# Patient Record
Sex: Female | Born: 1950 | Race: White | Hispanic: No | Marital: Married | Smoking: Never smoker
Health system: Southern US, Community
[De-identification: ages and names within clinical notes are randomized; demographics above are authoritative.]

## PROBLEM LIST (undated history)

## (undated) DIAGNOSIS — R110 Nausea: Secondary | ICD-10-CM

## (undated) DIAGNOSIS — E785 Hyperlipidemia, unspecified: Secondary | ICD-10-CM

## (undated) DIAGNOSIS — F410 Panic disorder [episodic paroxysmal anxiety] without agoraphobia: Secondary | ICD-10-CM

## (undated) DIAGNOSIS — M779 Enthesopathy, unspecified: Secondary | ICD-10-CM

## (undated) DIAGNOSIS — F419 Anxiety disorder, unspecified: Secondary | ICD-10-CM

## (undated) DIAGNOSIS — G43909 Migraine, unspecified, not intractable, without status migrainosus: Secondary | ICD-10-CM

## (undated) DIAGNOSIS — N390 Urinary tract infection, site not specified: Secondary | ICD-10-CM

## (undated) DIAGNOSIS — D649 Anemia, unspecified: Secondary | ICD-10-CM

## (undated) DIAGNOSIS — M2540 Effusion, unspecified joint: Secondary | ICD-10-CM

## (undated) DIAGNOSIS — I10 Essential (primary) hypertension: Secondary | ICD-10-CM

## (undated) DIAGNOSIS — M1990 Unspecified osteoarthritis, unspecified site: Secondary | ICD-10-CM

## (undated) DIAGNOSIS — Z8719 Personal history of other diseases of the digestive system: Secondary | ICD-10-CM

## (undated) DIAGNOSIS — K219 Gastro-esophageal reflux disease without esophagitis: Secondary | ICD-10-CM

## (undated) DIAGNOSIS — Z7901 Long term (current) use of anticoagulants: Secondary | ICD-10-CM

## (undated) DIAGNOSIS — I480 Paroxysmal atrial fibrillation: Secondary | ICD-10-CM

## (undated) DIAGNOSIS — J45909 Unspecified asthma, uncomplicated: Secondary | ICD-10-CM

## (undated) DIAGNOSIS — M79609 Pain in unspecified limb: Secondary | ICD-10-CM

## (undated) HISTORY — PX: ROTATOR CUFF REPAIR: SHX139

## (undated) HISTORY — DX: Pain in unspecified limb: M79.609

## (undated) HISTORY — DX: Personal history of other diseases of the digestive system: Z87.19

## (undated) HISTORY — DX: Anemia, unspecified: D64.9

## (undated) HISTORY — PX: CARDIOVERSION: SHX1299

## (undated) HISTORY — DX: Urinary tract infection, site not specified: N39.0

## (undated) HISTORY — DX: Hyperlipidemia, unspecified: E78.5

## (undated) HISTORY — DX: Nausea: R11.0

## (undated) HISTORY — PX: ABDOMINAL HYSTERECTOMY: SHX81

## (undated) HISTORY — DX: Unspecified osteoarthritis, unspecified site: M19.90

## (undated) HISTORY — DX: Enthesopathy, unspecified: M77.9

## (undated) HISTORY — PX: COLONOSCOPY: SHX174

## (undated) HISTORY — DX: Panic disorder (episodic paroxysmal anxiety): F41.0

## (undated) HISTORY — DX: Gastro-esophageal reflux disease without esophagitis: K21.9

## (undated) HISTORY — DX: Anxiety disorder, unspecified: F41.9

## (undated) HISTORY — DX: Migraine, unspecified, not intractable, without status migrainosus: G43.909

## (undated) HISTORY — DX: Essential (primary) hypertension: I10

## (undated) HISTORY — DX: Unspecified asthma, uncomplicated: J45.909

## (undated) HISTORY — DX: Paroxysmal atrial fibrillation: I48.0

## (undated) HISTORY — DX: Long term (current) use of anticoagulants: Z79.01

## (undated) HISTORY — DX: Effusion, unspecified joint: M25.40

---

## 2014-11-16 DIAGNOSIS — K219 Gastro-esophageal reflux disease without esophagitis: Secondary | ICD-10-CM | POA: Insufficient documentation

## 2014-11-16 DIAGNOSIS — M79609 Pain in unspecified limb: Secondary | ICD-10-CM

## 2014-11-16 DIAGNOSIS — M779 Enthesopathy, unspecified: Secondary | ICD-10-CM

## 2014-11-16 DIAGNOSIS — J45909 Unspecified asthma, uncomplicated: Secondary | ICD-10-CM

## 2014-11-16 DIAGNOSIS — R110 Nausea: Secondary | ICD-10-CM | POA: Insufficient documentation

## 2014-11-16 DIAGNOSIS — M2540 Effusion, unspecified joint: Secondary | ICD-10-CM

## 2014-11-16 DIAGNOSIS — Z7901 Long term (current) use of anticoagulants: Secondary | ICD-10-CM

## 2014-11-16 DIAGNOSIS — F410 Panic disorder [episodic paroxysmal anxiety] without agoraphobia: Secondary | ICD-10-CM

## 2014-11-16 DIAGNOSIS — G43909 Migraine, unspecified, not intractable, without status migrainosus: Secondary | ICD-10-CM

## 2014-11-16 HISTORY — DX: Effusion, unspecified joint: M25.40

## 2014-11-16 HISTORY — DX: Migraine, unspecified, not intractable, without status migrainosus: G43.909

## 2014-11-16 HISTORY — DX: Gastro-esophageal reflux disease without esophagitis: K21.9

## 2014-11-16 HISTORY — DX: Long term (current) use of anticoagulants: Z79.01

## 2014-11-16 HISTORY — DX: Pain in unspecified limb: M79.609

## 2014-11-16 HISTORY — DX: Nausea: R11.0

## 2014-11-16 HISTORY — DX: Unspecified asthma, uncomplicated: J45.909

## 2014-11-16 HISTORY — DX: Panic disorder (episodic paroxysmal anxiety): F41.0

## 2014-11-16 HISTORY — DX: Enthesopathy, unspecified: M77.9

## 2014-11-17 DIAGNOSIS — I480 Paroxysmal atrial fibrillation: Secondary | ICD-10-CM

## 2014-11-17 DIAGNOSIS — E785 Hyperlipidemia, unspecified: Secondary | ICD-10-CM

## 2014-11-17 HISTORY — DX: Paroxysmal atrial fibrillation: I48.0

## 2014-11-17 HISTORY — DX: Hyperlipidemia, unspecified: E78.5

## 2015-08-15 DIAGNOSIS — I10 Essential (primary) hypertension: Secondary | ICD-10-CM

## 2015-08-15 HISTORY — DX: Essential (primary) hypertension: I10

## 2015-12-19 DIAGNOSIS — R0789 Other chest pain: Secondary | ICD-10-CM | POA: Insufficient documentation

## 2015-12-19 DIAGNOSIS — N390 Urinary tract infection, site not specified: Secondary | ICD-10-CM

## 2015-12-19 HISTORY — DX: Urinary tract infection, site not specified: N39.0

## 2016-04-29 DIAGNOSIS — R5383 Other fatigue: Secondary | ICD-10-CM | POA: Diagnosis not present

## 2016-04-29 DIAGNOSIS — I4891 Unspecified atrial fibrillation: Secondary | ICD-10-CM | POA: Diagnosis not present

## 2016-04-29 DIAGNOSIS — J209 Acute bronchitis, unspecified: Secondary | ICD-10-CM | POA: Diagnosis not present

## 2016-04-29 DIAGNOSIS — I10 Essential (primary) hypertension: Secondary | ICD-10-CM | POA: Diagnosis not present

## 2016-05-07 DIAGNOSIS — Z7901 Long term (current) use of anticoagulants: Secondary | ICD-10-CM | POA: Diagnosis not present

## 2016-05-07 DIAGNOSIS — I10 Essential (primary) hypertension: Secondary | ICD-10-CM | POA: Diagnosis not present

## 2016-05-07 DIAGNOSIS — I480 Paroxysmal atrial fibrillation: Secondary | ICD-10-CM | POA: Diagnosis not present

## 2016-05-07 DIAGNOSIS — J0110 Acute frontal sinusitis, unspecified: Secondary | ICD-10-CM | POA: Diagnosis not present

## 2016-07-01 DIAGNOSIS — E784 Other hyperlipidemia: Secondary | ICD-10-CM | POA: Diagnosis not present

## 2016-07-01 DIAGNOSIS — J4520 Mild intermittent asthma, uncomplicated: Secondary | ICD-10-CM | POA: Diagnosis not present

## 2016-07-01 DIAGNOSIS — I10 Essential (primary) hypertension: Secondary | ICD-10-CM | POA: Diagnosis not present

## 2016-07-01 DIAGNOSIS — Z7901 Long term (current) use of anticoagulants: Secondary | ICD-10-CM | POA: Diagnosis not present

## 2016-07-01 DIAGNOSIS — I480 Paroxysmal atrial fibrillation: Secondary | ICD-10-CM | POA: Diagnosis not present

## 2016-07-05 DIAGNOSIS — I482 Chronic atrial fibrillation, unspecified: Secondary | ICD-10-CM | POA: Insufficient documentation

## 2016-07-05 DIAGNOSIS — J449 Chronic obstructive pulmonary disease, unspecified: Secondary | ICD-10-CM | POA: Diagnosis not present

## 2016-07-05 DIAGNOSIS — I5032 Chronic diastolic (congestive) heart failure: Secondary | ICD-10-CM | POA: Diagnosis not present

## 2016-07-05 DIAGNOSIS — I480 Paroxysmal atrial fibrillation: Secondary | ICD-10-CM | POA: Diagnosis not present

## 2016-07-05 DIAGNOSIS — R791 Abnormal coagulation profile: Secondary | ICD-10-CM | POA: Diagnosis not present

## 2016-07-05 DIAGNOSIS — I509 Heart failure, unspecified: Secondary | ICD-10-CM | POA: Diagnosis not present

## 2016-07-05 DIAGNOSIS — E669 Obesity, unspecified: Secondary | ICD-10-CM | POA: Diagnosis not present

## 2016-07-05 DIAGNOSIS — R531 Weakness: Secondary | ICD-10-CM | POA: Diagnosis not present

## 2016-07-05 DIAGNOSIS — I110 Hypertensive heart disease with heart failure: Secondary | ICD-10-CM | POA: Diagnosis not present

## 2016-07-05 DIAGNOSIS — Z7901 Long term (current) use of anticoagulants: Secondary | ICD-10-CM | POA: Diagnosis not present

## 2016-07-05 DIAGNOSIS — R079 Chest pain, unspecified: Secondary | ICD-10-CM | POA: Diagnosis not present

## 2016-07-05 DIAGNOSIS — I4891 Unspecified atrial fibrillation: Secondary | ICD-10-CM | POA: Diagnosis not present

## 2016-07-06 DIAGNOSIS — E669 Obesity, unspecified: Secondary | ICD-10-CM | POA: Diagnosis not present

## 2016-07-06 DIAGNOSIS — Z8249 Family history of ischemic heart disease and other diseases of the circulatory system: Secondary | ICD-10-CM | POA: Diagnosis not present

## 2016-07-06 DIAGNOSIS — I119 Hypertensive heart disease without heart failure: Secondary | ICD-10-CM | POA: Diagnosis not present

## 2016-07-06 DIAGNOSIS — E785 Hyperlipidemia, unspecified: Secondary | ICD-10-CM | POA: Diagnosis not present

## 2016-07-06 DIAGNOSIS — I480 Paroxysmal atrial fibrillation: Secondary | ICD-10-CM | POA: Diagnosis not present

## 2016-07-06 DIAGNOSIS — E784 Other hyperlipidemia: Secondary | ICD-10-CM | POA: Diagnosis not present

## 2016-07-06 DIAGNOSIS — R531 Weakness: Secondary | ICD-10-CM | POA: Diagnosis not present

## 2016-07-06 DIAGNOSIS — J449 Chronic obstructive pulmonary disease, unspecified: Secondary | ICD-10-CM | POA: Diagnosis not present

## 2016-07-06 DIAGNOSIS — I10 Essential (primary) hypertension: Secondary | ICD-10-CM | POA: Diagnosis not present

## 2016-07-06 DIAGNOSIS — Z7901 Long term (current) use of anticoagulants: Secondary | ICD-10-CM | POA: Diagnosis not present

## 2016-07-07 DIAGNOSIS — I951 Orthostatic hypotension: Secondary | ICD-10-CM | POA: Diagnosis not present

## 2016-07-07 DIAGNOSIS — I480 Paroxysmal atrial fibrillation: Secondary | ICD-10-CM | POA: Diagnosis not present

## 2016-07-07 DIAGNOSIS — E784 Other hyperlipidemia: Secondary | ICD-10-CM | POA: Diagnosis not present

## 2016-07-07 DIAGNOSIS — E669 Obesity, unspecified: Secondary | ICD-10-CM | POA: Diagnosis not present

## 2016-07-07 DIAGNOSIS — I10 Essential (primary) hypertension: Secondary | ICD-10-CM | POA: Diagnosis not present

## 2016-07-07 DIAGNOSIS — I119 Hypertensive heart disease without heart failure: Secondary | ICD-10-CM | POA: Diagnosis not present

## 2016-07-07 DIAGNOSIS — Z7901 Long term (current) use of anticoagulants: Secondary | ICD-10-CM | POA: Diagnosis not present

## 2016-07-07 DIAGNOSIS — J449 Chronic obstructive pulmonary disease, unspecified: Secondary | ICD-10-CM | POA: Diagnosis not present

## 2016-07-07 DIAGNOSIS — E785 Hyperlipidemia, unspecified: Secondary | ICD-10-CM | POA: Diagnosis not present

## 2016-07-08 DIAGNOSIS — I110 Hypertensive heart disease with heart failure: Secondary | ICD-10-CM | POA: Diagnosis present

## 2016-07-08 DIAGNOSIS — R001 Bradycardia, unspecified: Secondary | ICD-10-CM | POA: Diagnosis not present

## 2016-07-08 DIAGNOSIS — I440 Atrioventricular block, first degree: Secondary | ICD-10-CM | POA: Diagnosis not present

## 2016-07-08 DIAGNOSIS — I10 Essential (primary) hypertension: Secondary | ICD-10-CM | POA: Diagnosis not present

## 2016-07-08 DIAGNOSIS — I4891 Unspecified atrial fibrillation: Secondary | ICD-10-CM | POA: Diagnosis not present

## 2016-07-08 DIAGNOSIS — M1990 Unspecified osteoarthritis, unspecified site: Secondary | ICD-10-CM | POA: Diagnosis present

## 2016-07-08 DIAGNOSIS — G43909 Migraine, unspecified, not intractable, without status migrainosus: Secondary | ICD-10-CM | POA: Diagnosis present

## 2016-07-08 DIAGNOSIS — Z79899 Other long term (current) drug therapy: Secondary | ICD-10-CM | POA: Diagnosis not present

## 2016-07-08 DIAGNOSIS — E785 Hyperlipidemia, unspecified: Secondary | ICD-10-CM | POA: Diagnosis not present

## 2016-07-08 DIAGNOSIS — J45909 Unspecified asthma, uncomplicated: Secondary | ICD-10-CM | POA: Diagnosis present

## 2016-07-08 DIAGNOSIS — I509 Heart failure, unspecified: Secondary | ICD-10-CM | POA: Diagnosis present

## 2016-07-08 DIAGNOSIS — I480 Paroxysmal atrial fibrillation: Secondary | ICD-10-CM | POA: Diagnosis not present

## 2016-07-08 DIAGNOSIS — I119 Hypertensive heart disease without heart failure: Secondary | ICD-10-CM | POA: Diagnosis not present

## 2016-07-08 DIAGNOSIS — E784 Other hyperlipidemia: Secondary | ICD-10-CM | POA: Diagnosis not present

## 2016-07-08 DIAGNOSIS — J449 Chronic obstructive pulmonary disease, unspecified: Secondary | ICD-10-CM | POA: Diagnosis not present

## 2016-07-08 DIAGNOSIS — R000 Tachycardia, unspecified: Secondary | ICD-10-CM | POA: Diagnosis not present

## 2016-07-08 DIAGNOSIS — Z6829 Body mass index (BMI) 29.0-29.9, adult: Secondary | ICD-10-CM | POA: Diagnosis not present

## 2016-07-08 DIAGNOSIS — R9431 Abnormal electrocardiogram [ECG] [EKG]: Secondary | ICD-10-CM | POA: Diagnosis not present

## 2016-07-08 DIAGNOSIS — J441 Chronic obstructive pulmonary disease with (acute) exacerbation: Secondary | ICD-10-CM | POA: Diagnosis not present

## 2016-07-08 DIAGNOSIS — E669 Obesity, unspecified: Secondary | ICD-10-CM | POA: Diagnosis present

## 2016-07-08 DIAGNOSIS — R5383 Other fatigue: Secondary | ICD-10-CM | POA: Diagnosis present

## 2016-07-08 DIAGNOSIS — J45998 Other asthma: Secondary | ICD-10-CM | POA: Diagnosis not present

## 2016-07-08 DIAGNOSIS — Z7951 Long term (current) use of inhaled steroids: Secondary | ICD-10-CM | POA: Diagnosis not present

## 2016-07-08 DIAGNOSIS — R531 Weakness: Secondary | ICD-10-CM | POA: Diagnosis not present

## 2016-07-08 DIAGNOSIS — Z7901 Long term (current) use of anticoagulants: Secondary | ICD-10-CM | POA: Diagnosis not present

## 2016-07-08 DIAGNOSIS — F419 Anxiety disorder, unspecified: Secondary | ICD-10-CM | POA: Diagnosis present

## 2016-07-08 DIAGNOSIS — I517 Cardiomegaly: Secondary | ICD-10-CM | POA: Diagnosis not present

## 2016-07-08 DIAGNOSIS — I959 Hypotension, unspecified: Secondary | ICD-10-CM | POA: Diagnosis not present

## 2016-07-08 DIAGNOSIS — I5032 Chronic diastolic (congestive) heart failure: Secondary | ICD-10-CM | POA: Diagnosis present

## 2016-07-08 DIAGNOSIS — I951 Orthostatic hypotension: Secondary | ICD-10-CM | POA: Diagnosis not present

## 2016-07-16 DIAGNOSIS — I4891 Unspecified atrial fibrillation: Secondary | ICD-10-CM | POA: Diagnosis not present

## 2016-07-26 DIAGNOSIS — E785 Hyperlipidemia, unspecified: Secondary | ICD-10-CM | POA: Diagnosis not present

## 2016-07-26 DIAGNOSIS — I480 Paroxysmal atrial fibrillation: Secondary | ICD-10-CM | POA: Diagnosis not present

## 2016-07-26 DIAGNOSIS — I10 Essential (primary) hypertension: Secondary | ICD-10-CM | POA: Diagnosis not present

## 2016-08-01 DIAGNOSIS — G4700 Insomnia, unspecified: Secondary | ICD-10-CM | POA: Diagnosis not present

## 2016-08-01 DIAGNOSIS — I4891 Unspecified atrial fibrillation: Secondary | ICD-10-CM | POA: Diagnosis not present

## 2016-08-01 DIAGNOSIS — F419 Anxiety disorder, unspecified: Secondary | ICD-10-CM | POA: Diagnosis not present

## 2016-08-01 DIAGNOSIS — G4459 Other complicated headache syndrome: Secondary | ICD-10-CM | POA: Diagnosis not present

## 2016-08-01 DIAGNOSIS — I10 Essential (primary) hypertension: Secondary | ICD-10-CM | POA: Diagnosis not present

## 2016-08-13 DIAGNOSIS — Z7901 Long term (current) use of anticoagulants: Secondary | ICD-10-CM | POA: Diagnosis not present

## 2016-08-13 DIAGNOSIS — I4891 Unspecified atrial fibrillation: Secondary | ICD-10-CM | POA: Diagnosis not present

## 2016-08-29 DIAGNOSIS — G4700 Insomnia, unspecified: Secondary | ICD-10-CM | POA: Diagnosis not present

## 2016-08-29 DIAGNOSIS — I4891 Unspecified atrial fibrillation: Secondary | ICD-10-CM | POA: Diagnosis not present

## 2016-08-29 DIAGNOSIS — F419 Anxiety disorder, unspecified: Secondary | ICD-10-CM | POA: Diagnosis not present

## 2016-08-29 DIAGNOSIS — G4459 Other complicated headache syndrome: Secondary | ICD-10-CM | POA: Diagnosis not present

## 2016-08-29 DIAGNOSIS — I10 Essential (primary) hypertension: Secondary | ICD-10-CM | POA: Diagnosis not present

## 2016-09-26 DIAGNOSIS — Z7901 Long term (current) use of anticoagulants: Secondary | ICD-10-CM | POA: Diagnosis not present

## 2016-09-26 DIAGNOSIS — I4891 Unspecified atrial fibrillation: Secondary | ICD-10-CM | POA: Diagnosis not present

## 2016-09-26 DIAGNOSIS — R000 Tachycardia, unspecified: Secondary | ICD-10-CM | POA: Diagnosis not present

## 2016-09-26 DIAGNOSIS — I499 Cardiac arrhythmia, unspecified: Secondary | ICD-10-CM | POA: Diagnosis not present

## 2016-09-26 DIAGNOSIS — J449 Chronic obstructive pulmonary disease, unspecified: Secondary | ICD-10-CM | POA: Diagnosis not present

## 2016-09-26 DIAGNOSIS — R0602 Shortness of breath: Secondary | ICD-10-CM | POA: Diagnosis not present

## 2016-09-26 DIAGNOSIS — F419 Anxiety disorder, unspecified: Secondary | ICD-10-CM | POA: Diagnosis not present

## 2016-09-26 DIAGNOSIS — I951 Orthostatic hypotension: Secondary | ICD-10-CM | POA: Diagnosis not present

## 2016-09-26 DIAGNOSIS — J45909 Unspecified asthma, uncomplicated: Secondary | ICD-10-CM | POA: Diagnosis not present

## 2016-09-26 DIAGNOSIS — Z8249 Family history of ischemic heart disease and other diseases of the circulatory system: Secondary | ICD-10-CM | POA: Diagnosis not present

## 2016-09-26 DIAGNOSIS — J45998 Other asthma: Secondary | ICD-10-CM | POA: Diagnosis not present

## 2016-09-26 DIAGNOSIS — K219 Gastro-esophageal reflux disease without esophagitis: Secondary | ICD-10-CM | POA: Diagnosis not present

## 2016-09-26 DIAGNOSIS — R002 Palpitations: Secondary | ICD-10-CM | POA: Diagnosis not present

## 2016-09-26 DIAGNOSIS — E785 Hyperlipidemia, unspecified: Secondary | ICD-10-CM | POA: Diagnosis not present

## 2016-09-26 DIAGNOSIS — G43909 Migraine, unspecified, not intractable, without status migrainosus: Secondary | ICD-10-CM | POA: Diagnosis not present

## 2016-09-26 DIAGNOSIS — G909 Disorder of the autonomic nervous system, unspecified: Secondary | ICD-10-CM | POA: Diagnosis not present

## 2016-09-26 DIAGNOSIS — D649 Anemia, unspecified: Secondary | ICD-10-CM | POA: Diagnosis not present

## 2016-09-26 DIAGNOSIS — I480 Paroxysmal atrial fibrillation: Secondary | ICD-10-CM | POA: Diagnosis not present

## 2016-09-26 DIAGNOSIS — I10 Essential (primary) hypertension: Secondary | ICD-10-CM | POA: Diagnosis not present

## 2016-09-26 DIAGNOSIS — I482 Chronic atrial fibrillation: Secondary | ICD-10-CM | POA: Diagnosis not present

## 2016-09-26 DIAGNOSIS — M1990 Unspecified osteoarthritis, unspecified site: Secondary | ICD-10-CM | POA: Diagnosis present

## 2016-09-26 DIAGNOSIS — I959 Hypotension, unspecified: Secondary | ICD-10-CM | POA: Diagnosis not present

## 2016-09-30 DIAGNOSIS — I4891 Unspecified atrial fibrillation: Secondary | ICD-10-CM | POA: Diagnosis not present

## 2016-09-30 DIAGNOSIS — G4700 Insomnia, unspecified: Secondary | ICD-10-CM | POA: Diagnosis not present

## 2016-09-30 DIAGNOSIS — F419 Anxiety disorder, unspecified: Secondary | ICD-10-CM | POA: Diagnosis not present

## 2016-09-30 DIAGNOSIS — G4459 Other complicated headache syndrome: Secondary | ICD-10-CM | POA: Diagnosis not present

## 2016-09-30 DIAGNOSIS — I10 Essential (primary) hypertension: Secondary | ICD-10-CM | POA: Diagnosis not present

## 2016-10-16 DIAGNOSIS — E784 Other hyperlipidemia: Secondary | ICD-10-CM | POA: Diagnosis not present

## 2016-10-16 DIAGNOSIS — K219 Gastro-esophageal reflux disease without esophagitis: Secondary | ICD-10-CM | POA: Diagnosis not present

## 2016-10-16 DIAGNOSIS — I480 Paroxysmal atrial fibrillation: Secondary | ICD-10-CM | POA: Diagnosis not present

## 2016-10-16 DIAGNOSIS — Z7901 Long term (current) use of anticoagulants: Secondary | ICD-10-CM | POA: Diagnosis not present

## 2016-10-16 DIAGNOSIS — Z79899 Other long term (current) drug therapy: Secondary | ICD-10-CM | POA: Diagnosis not present

## 2016-10-16 DIAGNOSIS — I10 Essential (primary) hypertension: Secondary | ICD-10-CM | POA: Diagnosis not present

## 2016-11-06 DIAGNOSIS — I081 Rheumatic disorders of both mitral and tricuspid valves: Secondary | ICD-10-CM | POA: Diagnosis not present

## 2016-11-06 DIAGNOSIS — I361 Nonrheumatic tricuspid (valve) insufficiency: Secondary | ICD-10-CM | POA: Diagnosis not present

## 2016-11-06 DIAGNOSIS — I4891 Unspecified atrial fibrillation: Secondary | ICD-10-CM | POA: Diagnosis not present

## 2016-11-06 DIAGNOSIS — Z01818 Encounter for other preprocedural examination: Secondary | ICD-10-CM | POA: Diagnosis not present

## 2016-11-06 DIAGNOSIS — Z8679 Personal history of other diseases of the circulatory system: Secondary | ICD-10-CM | POA: Diagnosis not present

## 2016-11-06 DIAGNOSIS — I371 Nonrheumatic pulmonary valve insufficiency: Secondary | ICD-10-CM | POA: Diagnosis not present

## 2016-11-07 DIAGNOSIS — I509 Heart failure, unspecified: Secondary | ICD-10-CM | POA: Diagnosis not present

## 2016-11-07 DIAGNOSIS — I110 Hypertensive heart disease with heart failure: Secondary | ICD-10-CM | POA: Diagnosis not present

## 2016-11-07 DIAGNOSIS — J45909 Unspecified asthma, uncomplicated: Secondary | ICD-10-CM | POA: Diagnosis not present

## 2016-11-07 DIAGNOSIS — R51 Headache: Secondary | ICD-10-CM | POA: Diagnosis not present

## 2016-11-07 DIAGNOSIS — K219 Gastro-esophageal reflux disease without esophagitis: Secondary | ICD-10-CM | POA: Diagnosis not present

## 2016-11-07 DIAGNOSIS — Z539 Procedure and treatment not carried out, unspecified reason: Secondary | ICD-10-CM | POA: Diagnosis not present

## 2016-11-07 DIAGNOSIS — I4891 Unspecified atrial fibrillation: Secondary | ICD-10-CM | POA: Diagnosis not present

## 2016-11-24 DIAGNOSIS — R0602 Shortness of breath: Secondary | ICD-10-CM | POA: Diagnosis not present

## 2016-11-24 DIAGNOSIS — K922 Gastrointestinal hemorrhage, unspecified: Secondary | ICD-10-CM | POA: Insufficient documentation

## 2016-11-24 DIAGNOSIS — E785 Hyperlipidemia, unspecified: Secondary | ICD-10-CM | POA: Diagnosis present

## 2016-11-24 DIAGNOSIS — I509 Heart failure, unspecified: Secondary | ICD-10-CM | POA: Diagnosis not present

## 2016-11-24 DIAGNOSIS — Z7901 Long term (current) use of anticoagulants: Secondary | ICD-10-CM | POA: Diagnosis not present

## 2016-11-24 DIAGNOSIS — D649 Anemia, unspecified: Secondary | ICD-10-CM | POA: Diagnosis not present

## 2016-11-24 DIAGNOSIS — Z7951 Long term (current) use of inhaled steroids: Secondary | ICD-10-CM | POA: Diagnosis not present

## 2016-11-24 DIAGNOSIS — J449 Chronic obstructive pulmonary disease, unspecified: Secondary | ICD-10-CM | POA: Diagnosis not present

## 2016-11-24 DIAGNOSIS — K635 Polyp of colon: Secondary | ICD-10-CM | POA: Diagnosis not present

## 2016-11-24 DIAGNOSIS — I480 Paroxysmal atrial fibrillation: Secondary | ICD-10-CM | POA: Diagnosis not present

## 2016-11-24 DIAGNOSIS — E876 Hypokalemia: Secondary | ICD-10-CM | POA: Diagnosis not present

## 2016-11-24 DIAGNOSIS — E784 Other hyperlipidemia: Secondary | ICD-10-CM | POA: Diagnosis not present

## 2016-11-24 DIAGNOSIS — Z8249 Family history of ischemic heart disease and other diseases of the circulatory system: Secondary | ICD-10-CM | POA: Diagnosis not present

## 2016-11-24 DIAGNOSIS — D122 Benign neoplasm of ascending colon: Secondary | ICD-10-CM | POA: Diagnosis not present

## 2016-11-24 DIAGNOSIS — R002 Palpitations: Secondary | ICD-10-CM | POA: Diagnosis not present

## 2016-11-24 DIAGNOSIS — R531 Weakness: Secondary | ICD-10-CM | POA: Diagnosis not present

## 2016-11-24 DIAGNOSIS — I110 Hypertensive heart disease with heart failure: Secondary | ICD-10-CM | POA: Diagnosis present

## 2016-11-24 DIAGNOSIS — R195 Other fecal abnormalities: Secondary | ICD-10-CM | POA: Diagnosis not present

## 2016-11-24 DIAGNOSIS — F419 Anxiety disorder, unspecified: Secondary | ICD-10-CM | POA: Diagnosis present

## 2016-11-24 DIAGNOSIS — K648 Other hemorrhoids: Secondary | ICD-10-CM | POA: Diagnosis not present

## 2016-11-24 DIAGNOSIS — D123 Benign neoplasm of transverse colon: Secondary | ICD-10-CM | POA: Diagnosis not present

## 2016-11-24 DIAGNOSIS — I482 Chronic atrial fibrillation: Secondary | ICD-10-CM | POA: Diagnosis not present

## 2016-11-24 DIAGNOSIS — I10 Essential (primary) hypertension: Secondary | ICD-10-CM | POA: Diagnosis not present

## 2016-11-24 DIAGNOSIS — D62 Acute posthemorrhagic anemia: Secondary | ICD-10-CM | POA: Diagnosis not present

## 2016-11-24 DIAGNOSIS — D125 Benign neoplasm of sigmoid colon: Secondary | ICD-10-CM | POA: Diagnosis not present

## 2016-11-24 DIAGNOSIS — R079 Chest pain, unspecified: Secondary | ICD-10-CM | POA: Diagnosis not present

## 2016-11-24 HISTORY — DX: Anemia, unspecified: D64.9

## 2016-12-02 DIAGNOSIS — G4459 Other complicated headache syndrome: Secondary | ICD-10-CM | POA: Diagnosis not present

## 2016-12-02 DIAGNOSIS — F419 Anxiety disorder, unspecified: Secondary | ICD-10-CM | POA: Diagnosis not present

## 2016-12-02 DIAGNOSIS — G4700 Insomnia, unspecified: Secondary | ICD-10-CM | POA: Diagnosis not present

## 2016-12-02 DIAGNOSIS — I10 Essential (primary) hypertension: Secondary | ICD-10-CM | POA: Diagnosis not present

## 2016-12-02 DIAGNOSIS — I4891 Unspecified atrial fibrillation: Secondary | ICD-10-CM | POA: Diagnosis not present

## 2016-12-11 DIAGNOSIS — D649 Anemia, unspecified: Secondary | ICD-10-CM | POA: Diagnosis not present

## 2016-12-11 DIAGNOSIS — Z8719 Personal history of other diseases of the digestive system: Secondary | ICD-10-CM | POA: Diagnosis not present

## 2016-12-11 DIAGNOSIS — J4520 Mild intermittent asthma, uncomplicated: Secondary | ICD-10-CM | POA: Diagnosis not present

## 2016-12-11 DIAGNOSIS — I10 Essential (primary) hypertension: Secondary | ICD-10-CM | POA: Diagnosis not present

## 2016-12-11 DIAGNOSIS — I480 Paroxysmal atrial fibrillation: Secondary | ICD-10-CM | POA: Diagnosis not present

## 2016-12-11 HISTORY — DX: Personal history of other diseases of the digestive system: Z87.19

## 2016-12-17 DIAGNOSIS — D500 Iron deficiency anemia secondary to blood loss (chronic): Secondary | ICD-10-CM | POA: Diagnosis not present

## 2016-12-19 DIAGNOSIS — L570 Actinic keratosis: Secondary | ICD-10-CM | POA: Diagnosis not present

## 2016-12-19 DIAGNOSIS — C44319 Basal cell carcinoma of skin of other parts of face: Secondary | ICD-10-CM | POA: Diagnosis not present

## 2016-12-19 DIAGNOSIS — D485 Neoplasm of uncertain behavior of skin: Secondary | ICD-10-CM | POA: Diagnosis not present

## 2016-12-19 DIAGNOSIS — L578 Other skin changes due to chronic exposure to nonionizing radiation: Secondary | ICD-10-CM | POA: Diagnosis not present

## 2016-12-19 DIAGNOSIS — L814 Other melanin hyperpigmentation: Secondary | ICD-10-CM | POA: Diagnosis not present

## 2016-12-19 DIAGNOSIS — C44311 Basal cell carcinoma of skin of nose: Secondary | ICD-10-CM | POA: Diagnosis not present

## 2016-12-30 DIAGNOSIS — I4891 Unspecified atrial fibrillation: Secondary | ICD-10-CM | POA: Diagnosis not present

## 2016-12-30 DIAGNOSIS — G4700 Insomnia, unspecified: Secondary | ICD-10-CM | POA: Diagnosis not present

## 2016-12-30 DIAGNOSIS — I10 Essential (primary) hypertension: Secondary | ICD-10-CM | POA: Diagnosis not present

## 2016-12-30 DIAGNOSIS — G4459 Other complicated headache syndrome: Secondary | ICD-10-CM | POA: Diagnosis not present

## 2016-12-30 DIAGNOSIS — F419 Anxiety disorder, unspecified: Secondary | ICD-10-CM | POA: Diagnosis not present

## 2017-01-01 DIAGNOSIS — Z23 Encounter for immunization: Secondary | ICD-10-CM | POA: Diagnosis not present

## 2017-01-16 DIAGNOSIS — D500 Iron deficiency anemia secondary to blood loss (chronic): Secondary | ICD-10-CM | POA: Diagnosis not present

## 2017-01-27 DIAGNOSIS — F419 Anxiety disorder, unspecified: Secondary | ICD-10-CM | POA: Diagnosis not present

## 2017-01-27 DIAGNOSIS — I4891 Unspecified atrial fibrillation: Secondary | ICD-10-CM | POA: Diagnosis not present

## 2017-01-27 DIAGNOSIS — I10 Essential (primary) hypertension: Secondary | ICD-10-CM | POA: Diagnosis not present

## 2017-01-27 DIAGNOSIS — G4700 Insomnia, unspecified: Secondary | ICD-10-CM | POA: Diagnosis not present

## 2017-01-27 DIAGNOSIS — G4459 Other complicated headache syndrome: Secondary | ICD-10-CM | POA: Diagnosis not present

## 2017-02-05 DIAGNOSIS — I480 Paroxysmal atrial fibrillation: Secondary | ICD-10-CM | POA: Diagnosis not present

## 2017-02-05 DIAGNOSIS — I517 Cardiomegaly: Secondary | ICD-10-CM | POA: Diagnosis not present

## 2017-02-05 DIAGNOSIS — R079 Chest pain, unspecified: Secondary | ICD-10-CM | POA: Diagnosis not present

## 2017-02-05 DIAGNOSIS — I340 Nonrheumatic mitral (valve) insufficiency: Secondary | ICD-10-CM | POA: Diagnosis not present

## 2017-02-07 DIAGNOSIS — I480 Paroxysmal atrial fibrillation: Secondary | ICD-10-CM | POA: Diagnosis not present

## 2017-02-07 DIAGNOSIS — I509 Heart failure, unspecified: Secondary | ICD-10-CM | POA: Diagnosis not present

## 2017-02-07 DIAGNOSIS — I4892 Unspecified atrial flutter: Secondary | ICD-10-CM | POA: Diagnosis not present

## 2017-02-07 DIAGNOSIS — D509 Iron deficiency anemia, unspecified: Secondary | ICD-10-CM | POA: Diagnosis not present

## 2017-02-07 DIAGNOSIS — E785 Hyperlipidemia, unspecified: Secondary | ICD-10-CM | POA: Diagnosis not present

## 2017-02-07 DIAGNOSIS — I110 Hypertensive heart disease with heart failure: Secondary | ICD-10-CM | POA: Diagnosis not present

## 2017-02-08 DIAGNOSIS — I480 Paroxysmal atrial fibrillation: Secondary | ICD-10-CM | POA: Diagnosis not present

## 2017-02-08 DIAGNOSIS — D509 Iron deficiency anemia, unspecified: Secondary | ICD-10-CM | POA: Diagnosis not present

## 2017-02-08 DIAGNOSIS — I4892 Unspecified atrial flutter: Secondary | ICD-10-CM | POA: Diagnosis not present

## 2017-02-08 DIAGNOSIS — I509 Heart failure, unspecified: Secondary | ICD-10-CM | POA: Diagnosis not present

## 2017-02-08 DIAGNOSIS — I110 Hypertensive heart disease with heart failure: Secondary | ICD-10-CM | POA: Diagnosis not present

## 2017-02-08 DIAGNOSIS — E785 Hyperlipidemia, unspecified: Secondary | ICD-10-CM | POA: Diagnosis not present

## 2017-02-24 DIAGNOSIS — I10 Essential (primary) hypertension: Secondary | ICD-10-CM | POA: Diagnosis not present

## 2017-02-24 DIAGNOSIS — I4891 Unspecified atrial fibrillation: Secondary | ICD-10-CM | POA: Diagnosis not present

## 2017-02-24 DIAGNOSIS — G4700 Insomnia, unspecified: Secondary | ICD-10-CM | POA: Diagnosis not present

## 2017-02-24 DIAGNOSIS — G4459 Other complicated headache syndrome: Secondary | ICD-10-CM | POA: Diagnosis not present

## 2017-02-24 DIAGNOSIS — F419 Anxiety disorder, unspecified: Secondary | ICD-10-CM | POA: Diagnosis not present

## 2017-03-10 DIAGNOSIS — K219 Gastro-esophageal reflux disease without esophagitis: Secondary | ICD-10-CM | POA: Diagnosis not present

## 2017-03-10 DIAGNOSIS — Z9889 Other specified postprocedural states: Secondary | ICD-10-CM | POA: Diagnosis not present

## 2017-03-10 DIAGNOSIS — I480 Paroxysmal atrial fibrillation: Secondary | ICD-10-CM | POA: Diagnosis not present

## 2017-03-10 DIAGNOSIS — Z8719 Personal history of other diseases of the digestive system: Secondary | ICD-10-CM | POA: Diagnosis not present

## 2017-03-10 DIAGNOSIS — Z79899 Other long term (current) drug therapy: Secondary | ICD-10-CM | POA: Diagnosis not present

## 2017-03-10 DIAGNOSIS — I10 Essential (primary) hypertension: Secondary | ICD-10-CM | POA: Diagnosis not present

## 2017-03-10 DIAGNOSIS — J4520 Mild intermittent asthma, uncomplicated: Secondary | ICD-10-CM | POA: Diagnosis not present

## 2017-03-10 DIAGNOSIS — D649 Anemia, unspecified: Secondary | ICD-10-CM | POA: Diagnosis not present

## 2017-03-10 DIAGNOSIS — R0600 Dyspnea, unspecified: Secondary | ICD-10-CM | POA: Diagnosis not present

## 2017-03-10 DIAGNOSIS — Z7901 Long term (current) use of anticoagulants: Secondary | ICD-10-CM | POA: Diagnosis not present

## 2017-03-19 ENCOUNTER — Encounter: Payer: Self-pay | Admitting: Internal Medicine

## 2017-03-19 DIAGNOSIS — I4891 Unspecified atrial fibrillation: Secondary | ICD-10-CM | POA: Diagnosis not present

## 2017-04-09 DIAGNOSIS — C44319 Basal cell carcinoma of skin of other parts of face: Secondary | ICD-10-CM | POA: Diagnosis not present

## 2017-04-30 DIAGNOSIS — Z9889 Other specified postprocedural states: Secondary | ICD-10-CM | POA: Diagnosis not present

## 2017-04-30 DIAGNOSIS — J4520 Mild intermittent asthma, uncomplicated: Secondary | ICD-10-CM | POA: Diagnosis not present

## 2017-04-30 DIAGNOSIS — I480 Paroxysmal atrial fibrillation: Secondary | ICD-10-CM | POA: Diagnosis not present

## 2017-05-05 DIAGNOSIS — I480 Paroxysmal atrial fibrillation: Secondary | ICD-10-CM | POA: Diagnosis not present

## 2017-07-24 DIAGNOSIS — L570 Actinic keratosis: Secondary | ICD-10-CM | POA: Diagnosis not present

## 2017-07-24 DIAGNOSIS — Z08 Encounter for follow-up examination after completed treatment for malignant neoplasm: Secondary | ICD-10-CM | POA: Diagnosis not present

## 2017-07-24 DIAGNOSIS — C44311 Basal cell carcinoma of skin of nose: Secondary | ICD-10-CM | POA: Diagnosis not present

## 2017-07-24 DIAGNOSIS — Z85828 Personal history of other malignant neoplasm of skin: Secondary | ICD-10-CM | POA: Diagnosis not present

## 2017-08-19 DIAGNOSIS — F419 Anxiety disorder, unspecified: Secondary | ICD-10-CM | POA: Diagnosis not present

## 2017-08-20 DIAGNOSIS — Z8719 Personal history of other diseases of the digestive system: Secondary | ICD-10-CM | POA: Diagnosis not present

## 2017-08-20 DIAGNOSIS — I480 Paroxysmal atrial fibrillation: Secondary | ICD-10-CM | POA: Diagnosis not present

## 2017-08-20 DIAGNOSIS — Z79899 Other long term (current) drug therapy: Secondary | ICD-10-CM | POA: Diagnosis not present

## 2017-08-20 DIAGNOSIS — I951 Orthostatic hypotension: Secondary | ICD-10-CM | POA: Diagnosis not present

## 2017-08-20 DIAGNOSIS — Z9889 Other specified postprocedural states: Secondary | ICD-10-CM | POA: Diagnosis not present

## 2017-08-20 DIAGNOSIS — J4520 Mild intermittent asthma, uncomplicated: Secondary | ICD-10-CM | POA: Diagnosis not present

## 2017-08-26 DIAGNOSIS — I951 Orthostatic hypotension: Secondary | ICD-10-CM | POA: Diagnosis not present

## 2017-08-26 DIAGNOSIS — I480 Paroxysmal atrial fibrillation: Secondary | ICD-10-CM | POA: Diagnosis not present

## 2017-08-28 DIAGNOSIS — D500 Iron deficiency anemia secondary to blood loss (chronic): Secondary | ICD-10-CM | POA: Diagnosis not present

## 2017-08-28 DIAGNOSIS — D649 Anemia, unspecified: Secondary | ICD-10-CM | POA: Diagnosis not present

## 2017-08-29 DIAGNOSIS — D649 Anemia, unspecified: Secondary | ICD-10-CM | POA: Diagnosis not present

## 2017-08-29 DIAGNOSIS — Z8719 Personal history of other diseases of the digestive system: Secondary | ICD-10-CM | POA: Diagnosis not present

## 2017-09-05 DIAGNOSIS — D649 Anemia, unspecified: Secondary | ICD-10-CM | POA: Diagnosis not present

## 2017-09-05 DIAGNOSIS — D509 Iron deficiency anemia, unspecified: Secondary | ICD-10-CM | POA: Diagnosis not present

## 2017-09-05 DIAGNOSIS — Z8719 Personal history of other diseases of the digestive system: Secondary | ICD-10-CM | POA: Diagnosis not present

## 2017-09-10 DIAGNOSIS — I480 Paroxysmal atrial fibrillation: Secondary | ICD-10-CM | POA: Diagnosis not present

## 2017-09-10 DIAGNOSIS — I951 Orthostatic hypotension: Secondary | ICD-10-CM | POA: Diagnosis not present

## 2017-09-18 DIAGNOSIS — Z23 Encounter for immunization: Secondary | ICD-10-CM | POA: Diagnosis not present

## 2017-09-19 DIAGNOSIS — D649 Anemia, unspecified: Secondary | ICD-10-CM | POA: Diagnosis not present

## 2017-09-23 DIAGNOSIS — I10 Essential (primary) hypertension: Secondary | ICD-10-CM | POA: Diagnosis not present

## 2017-09-23 DIAGNOSIS — J209 Acute bronchitis, unspecified: Secondary | ICD-10-CM | POA: Diagnosis not present

## 2017-09-23 DIAGNOSIS — R609 Edema, unspecified: Secondary | ICD-10-CM | POA: Diagnosis not present

## 2017-09-23 DIAGNOSIS — M79609 Pain in unspecified limb: Secondary | ICD-10-CM | POA: Diagnosis not present

## 2017-10-06 ENCOUNTER — Encounter: Payer: Self-pay | Admitting: *Deleted

## 2017-10-06 DIAGNOSIS — M1990 Unspecified osteoarthritis, unspecified site: Secondary | ICD-10-CM | POA: Insufficient documentation

## 2017-10-06 DIAGNOSIS — R079 Chest pain, unspecified: Secondary | ICD-10-CM | POA: Insufficient documentation

## 2017-10-06 DIAGNOSIS — F419 Anxiety disorder, unspecified: Secondary | ICD-10-CM

## 2017-10-06 DIAGNOSIS — R531 Weakness: Secondary | ICD-10-CM

## 2017-10-06 HISTORY — DX: Unspecified osteoarthritis, unspecified site: M19.90

## 2017-10-06 HISTORY — DX: Anxiety disorder, unspecified: F41.9

## 2017-10-09 DIAGNOSIS — Z7189 Other specified counseling: Secondary | ICD-10-CM | POA: Diagnosis not present

## 2017-10-09 DIAGNOSIS — D72819 Decreased white blood cell count, unspecified: Secondary | ICD-10-CM | POA: Diagnosis not present

## 2017-10-09 DIAGNOSIS — D509 Iron deficiency anemia, unspecified: Secondary | ICD-10-CM | POA: Diagnosis not present

## 2017-10-09 DIAGNOSIS — Z79899 Other long term (current) drug therapy: Secondary | ICD-10-CM | POA: Diagnosis not present

## 2017-10-09 DIAGNOSIS — Z7901 Long term (current) use of anticoagulants: Secondary | ICD-10-CM | POA: Diagnosis not present

## 2017-10-10 ENCOUNTER — Encounter: Payer: Self-pay | Admitting: Internal Medicine

## 2017-10-10 ENCOUNTER — Telehealth: Payer: Self-pay | Admitting: Internal Medicine

## 2017-10-10 NOTE — Telephone Encounter (Signed)
Called pt and left message for pt to call back to update personal history.

## 2017-10-13 ENCOUNTER — Ambulatory Visit (INDEPENDENT_AMBULATORY_CARE_PROVIDER_SITE_OTHER): Payer: Medicare Other | Admitting: Internal Medicine

## 2017-10-13 ENCOUNTER — Encounter: Payer: Self-pay | Admitting: Internal Medicine

## 2017-10-13 VITALS — BP 134/92 | HR 88 | Ht 68.0 in | Wt 217.0 lb

## 2017-10-13 DIAGNOSIS — D649 Anemia, unspecified: Secondary | ICD-10-CM | POA: Diagnosis not present

## 2017-10-13 DIAGNOSIS — I480 Paroxysmal atrial fibrillation: Secondary | ICD-10-CM

## 2017-10-13 DIAGNOSIS — R002 Palpitations: Secondary | ICD-10-CM

## 2017-10-13 NOTE — Progress Notes (Signed)
Electrophysiology Office Note   Date:  10/13/2017   ID:  Nicole [name], DOB 04/22/1951, MRN [id number]  PCP:  Harvie Junior, MD    Primary Electrophysiologist: Dr Ola Spurr  CC: afib   History of Present Illness: Nicole [name] is a 67 y.o. female who presents today for electrophysiology evaluation.   She reports initially being diagnosed with atrial fibrillation about 5 years after presenting with palpitations and fatigue.  She was initially managed with good success with flecainide.  Unfortunately, she began having additional AF and was placed on amiodarone.  Dr Ola Spurr had considered cryoablation however given anemia/ slow GI bleed this was cancelled.  Her anticoagulation was discontinued.  She has since been able to resume anticoagulation with eliquis.  She states that she did undergo afib ablation by Dr Ola Spurr in October though I do not have access to this. She reports no further afib but does continue to have tachy episodes.  She also has had dizziness, SOB, and presyncope with activity.  She states that she has seen Dr Ola Spurr in January and also May of this year (not available in [place]) and has worn 2 monitors (I do not have currently to review).  She has been found to have additional difficulty with anemia and has seen hematology.  She is unaware of their findings but remains on eliquis at this time. She has stopped amiodarone.  She is disgruntled with her care and presents today for second opinion regarding atrial arrhythmias.   Past Medical History:  Diagnosis Date  . Anticoagulant long-term use 11/16/2014  . Anxiety 10/06/2017  . Arthritis 10/06/2017   knees bilateral  . Asthma 11/16/2014  . Esophageal reflux 11/16/2014  . Essential hypertension 08/15/2015   pt denies  . H/O: GI bleed 12/11/2016  . Hyperlipidemia 11/17/2014  . Migraine headache 11/16/2014  . Nausea 11/16/2014  . Pain in limb 11/16/2014  . Panic disorder 11/16/2014  . Paroxysmal atrial  fibrillation ([place]) 11/17/2014  . Swelling of joint 11/16/2014  . Symptomatic anemia 11/24/2016  . Tendonitis 11/16/2014  . UTI (urinary tract infection) 12/19/2015   Current Outpatient Medications  Medication Sig Dispense Refill  . butalbital-acetaminophen-caffeine (ESGIC) 50-325-40 MG tablet Take 1 tablet by mouth every 8 (eight) hours as needed for headache.    . butorphanol (STADOL) 10 MG/ML nasal spray Place 1 spray into the nose every 4 (four) hours as needed for headache.    . clonazePAM (KLONOPIN) 1 MG tablet Take 1 mg by mouth 3 (three) times daily as needed for anxiety.    Marland Kitchen diltiazem (TIAZAC) 120 MG 24 hr capsule Take 120 mg by mouth daily.    Marland Kitchen ELIQUIS 5 MG TABS tablet Take 5 mg by mouth 2 (two) times daily.  6  . ferrous sulfate 325 (65 FE) MG tablet Take 325 mg by mouth 3 (three) times daily.  5  . fluticasone-salmeterol (ADVAIR HFA) 115-21 MCG/ACT inhaler Inhale 2 puffs into the lungs 2 (two) times daily.    . folic acid (FOLVITE) 1 MG tablet Take 1 mg by mouth daily.  6  . pravastatin (PRAVACHOL) 40 MG tablet Take 40 mg by mouth at bedtime.    . promethazine (PHENERGAN) 25 MG tablet Take 25 mg by mouth every 6 (six) hours as needed for nausea or vomiting.    Marland Kitchen zolpidem (AMBIEN) 5 MG tablet Take 5 mg by mouth at bedtime.    Marland Kitchen amiodarone (PACERONE) 200 MG tablet Take 200 mg by mouth 2 (two) times daily.  No current facility-administered medications for this visit.     Allergies:   Meperidine and Prednisone   Social History:  The patient  reports that she has never smoked. She has never used smokeless tobacco. She reports that she does not drink alcohol or use drugs.   Family History:  The patient's  family history includes Heart attack in her mother; Heart disease in her mother, sister, sister, sister, sister, and sister.    ROS:  Please see the history of present illness.   All other systems are personally reviewed and negative.    PHYSICAL EXAM: VS:  BP (!) 134/92    Pulse 88   Ht 5\' 8"  (1.727 m)   Wt 217 lb (98.4 kg)   BMI 32.99 kg/m  , BMI Body mass index is 32.99 kg/m. GEN: Well nourished, well developed, in no acute distress  HEENT: normal  Neck: no JVD, carotid bruits, or masses Cardiac: RRR; no murmurs, rubs, or gallops, + dependant edema  Respiratory:  clear to auscultation bilaterally, normal work of breathing GI: soft, nontender, nondistended, + BS MS: no deformity or atrophy  Skin: warm and dry  Neuro:  Strength and sensation are intact Psych: euthymic mood, full affect  EKG:  EKG is ordered today. The ekg ordered today is personally reviewed and shows sinus rhythm 88 bpm, PR 204 msec, QRS 88 msec, Qtc 447 msec   Recent Labs: No results found for requested labs within last 8760 hours.  personally reviewed   Lipid Panel  No results found for: CHOL, TRIG, HDL, CHOLHDL, VLDL, LDLCALC, LDLDIRECT personally reviewed   Wt Readings from Last 3 Encounters:  10/13/17 217 lb (98.4 kg)      Other studies personally reviewed: Additional studies/ records that were reviewed today include: Dr Tyler Pita notes, prior TEE  Review of the above records today demonstrates: TEE 11/06/16 revealed EF 55%, mild MR, mild biatrial enlargement   ASSESSMENT AND PLAN:  1.  Atrial fibrillation She does not think that she has had any afib post ablation.  Continue current strategy.  She remains on eliquis for chads2vasc score of at least 3.  Given recent difficulty with anemia, we may have to eventually discontinue anticoagulation.  Implantable loop recorder could be considered to guide anticoagulation at some point.  2. Palpitations She feels that her heart rate is frequently elevated and is suspicion for an arrhythmia other than afib.  She states that she has worn 2 event monitors, though I currently do not have access to these. I will obtain records of her recent event monitors and then make further recommendations depending on the results.  3.  Anemia She has had recent difficulty with anemia and is being followed by hematology.  I will try to obtain records from these encounters as this may shed further light on her tachypalpitations and SOB, which could be related to anemia.  Follow-up:  Return to see me in a few weeks once we have results of her event monitors. In the interim, she can follow-up at the AF clinic with any concerns related to her AF.  Current medicines are reviewed at length with the patient today.   The patient does not have concerns regarding her medicines.  The following changes were made today:  none  Signed, [name], MD  10/13/2017 9:44 AM     [facility] [address] [place] [address] [phone] (office) [phone] (fax)

## 2017-10-13 NOTE — Patient Instructions (Addendum)
Medication Instructions:  Your physician recommends that you continue on your current medications as directed. Please refer to the Current Medication list given to you today.  Labwork: None ordered     *We will only notify you of abnormal results, otherwise continue current treatment plan.  Testing/Procedures: None ordered  Follow-Up: Your physician recommends that you schedule a follow-up appointment in: 4-6 weeks with Dr. Rayann Heman.   * If you need a refill on your cardiac medications before your next appointment, please call your pharmacy.   *Please note that any paperwork needing to be filled out by the provider will need to be addressed at the front desk prior to seeing the provider. Please note that any FMLA, disability or other documents regarding health condition is subject to a $25.00 charge that must be received prior to completion of paperwork in the form of a money order or check.  Thank you for choosing [facility]!!    Any Other Special Instructions Will Be Listed Below (If Applicable). We will work on obtaining records from Dr. Ola Spurr and your hematologist.

## 2017-10-17 ENCOUNTER — Other Ambulatory Visit: Payer: Self-pay | Admitting: Internal Medicine

## 2017-11-24 ENCOUNTER — Encounter: Payer: Self-pay | Admitting: Internal Medicine

## 2017-11-24 ENCOUNTER — Ambulatory Visit (INDEPENDENT_AMBULATORY_CARE_PROVIDER_SITE_OTHER): Payer: Medicare Other | Admitting: Internal Medicine

## 2017-11-24 ENCOUNTER — Telehealth: Payer: Self-pay

## 2017-11-24 VITALS — BP 130/78 | HR 80 | Ht 68.0 in | Wt 207.2 lb

## 2017-11-24 DIAGNOSIS — D649 Anemia, unspecified: Secondary | ICD-10-CM | POA: Diagnosis not present

## 2017-11-24 DIAGNOSIS — I480 Paroxysmal atrial fibrillation: Secondary | ICD-10-CM

## 2017-11-24 DIAGNOSIS — R002 Palpitations: Secondary | ICD-10-CM

## 2017-11-24 NOTE — Telephone Encounter (Signed)
Spoke with representative of Dr. York Ram.  Verified Pt was seeing Dr. Jimmye Norman as PCP.  Last office visit with Dr. Jimmye Norman was 11/11/2017.  At that visit Pt reported being on amiodarone.  Per Dr. Jimmye Norman Pt was placed on amiodarone by previous cardiologist.  Dr. Rayann Heman notified.

## 2017-11-24 NOTE — Progress Notes (Signed)
PCP: Harvie Junior, MD   Primary EP: Dr Ramond Craver Odonohue is a 67 y.o. female who presents today for routine electrophysiology followup.  Since last being seen in our clinic, the patient reports doing reasonably well.  She is primarily limited by DJD.  She reports that this prevents regular exercise.  She does have SOB with moderate activity which is chronic.  I have advised that she follow-up with Dr Ola Spurr.  She spent quite some time today complaining about her care from him.  Based on my review, it appears that his care was timely, appropriate, and effective.  I have tried to reassure her today but was not able to do so.  Her AF seems quiescent since her ablation, though she does have nonsustained atach on her prior monitor with occasional palpitations.   Today, she denies symptoms of chest pain,  dizziness, presyncope, or syncope.  The patient is otherwise without complaint today.   Past Medical History:  Diagnosis Date  . Anticoagulant long-term use 11/16/2014  . Anxiety 10/06/2017  . Arthritis 10/06/2017   knees bilateral  . Asthma 11/16/2014  . Esophageal reflux 11/16/2014  . Essential hypertension 08/15/2015   pt denies  . H/O: GI bleed 12/11/2016  . Hyperlipidemia 11/17/2014  . Migraine headache 11/16/2014  . Nausea 11/16/2014  . Pain in limb 11/16/2014  . Panic disorder 11/16/2014  . Paroxysmal atrial fibrillation ([place]) 11/17/2014  . Swelling of joint 11/16/2014  . Symptomatic anemia 11/24/2016  . Tendonitis 11/16/2014  . UTI (urinary tract infection) 12/19/2015     ROS- all systems are reviewed and negatives except as per HPI above  Current Outpatient Medications  Medication Sig Dispense Refill  . amiodarone (PACERONE) 200 MG tablet Take 200 mg by mouth 2 (two) times daily.    . butalbital-acetaminophen-caffeine (ESGIC) 50-325-40 MG tablet Take 1 tablet by mouth every 8 (eight) hours as needed for headache.    . butorphanol (STADOL) 10 MG/ML nasal spray Place 1  spray into the nose every 4 (four) hours as needed for headache.    . clonazePAM (KLONOPIN) 1 MG tablet Take 1 mg by mouth 3 (three) times daily as needed for anxiety.    Marland Kitchen diltiazem (TIAZAC) 120 MG 24 hr capsule Take 120 mg by mouth daily.    Marland Kitchen ELIQUIS 5 MG TABS tablet Take 5 mg by mouth 2 (two) times daily.  6  . ferrous sulfate 325 (65 FE) MG tablet Take 325 mg by mouth 3 (three) times daily.  5  . fluticasone-salmeterol (ADVAIR HFA) 115-21 MCG/ACT inhaler Inhale 2 puffs into the lungs 2 (two) times daily.    . folic acid (FOLVITE) 1 MG tablet Take 1 mg by mouth daily.  6  . pravastatin (PRAVACHOL) 40 MG tablet Take 40 mg by mouth at bedtime.    . promethazine (PHENERGAN) 25 MG tablet Take 25 mg by mouth every 6 (six) hours as needed for nausea or vomiting.    Marland Kitchen zolpidem (AMBIEN) 5 MG tablet Take 5 mg by mouth at bedtime.     No current facility-administered medications for this visit.     Physical Exam: Vitals:   11/24/17 0839  BP: 130/78  Pulse: 80  SpO2: 97%  Weight: 207 lb 3.2 oz (94 kg)  Height: 5\' 8"  (1.727 m)     GEN- The patient is well appearing, alert and oriented x 3 today.   Head- normocephalic, atraumatic Eyes-  Sclera clear, conjunctiva pink Ears- hearing intact Oropharynx- clear  Lungs- Clear to ausculation bilaterally, normal work of breathing Heart- Regular rate and rhythm, no murmurs, rubs or gallops, PMI not laterally displaced GI- soft, NT, ND, + BS Extremities- no clubbing, cyanosis, trace edema  Wt Readings from Last 3 Encounters:  11/24/17 207 lb 3.2 oz (94 kg)  10/13/17 217 lb (98.4 kg)    EKG tracing ordered today is personally reviewed and shows sinus rhythm 80 bpm, PR 188 msec, QRS 98 msec, Qtc 442 msec.  Assessment and Plan:  1. afib  S/p crypo ablation for afib/ atrial flutter by Dr Ola Spurr 02/07/17 Appears to have had no further afib but does have episodes of PACs and nonsustained atach.  chads2vasc score is 3.  She has struggled with  anemia which has impacted her anticoagulation regimen.  She takes diltiazem but is no longer on amiodarone.  2. Palpitations I have reviewed her Zio monitor at length (scanned into Media in Epic). This revealed nonsustained atach (longest 12 seconds) and seems responsible for her symptoms.  Histograms looked ok.  From what I was able to review, I do not see afib episodes.  3. Anemia Likely the cause of her prior SOB. If she continues to have bleeding/ anemic issues, I have advised that we will have to stop anticoagulation.  I will defer this decision to her PCP and anemia specialists.  4. SOB multifactoral and chronic I have advised that she follow-up with Dr Ola Spurr but she declines She reports having a prior lexiscan which was unrevealing. Regular daily exercise is encouraged.  Return in 2 months  [name] MD, [facility] 11/24/2017 9:20 AM

## 2017-11-24 NOTE — Patient Instructions (Addendum)
Medication Instructions:  Your physician recommends that you continue on your current medications as directed. Please refer to the Current Medication list given to you today.  Please look at your medication bottles when you get home. Look for AMIODARONE.  I need to clarify if you are taking this medicine. Please call me back and let me know if you see this medication with your current pill bottles. Sonia Baller RN [phone]  Labwork: None ordered.  Testing/Procedures: None ordered.  Follow-Up: Your physician wants you to follow-up in: 2 months with Dr. Rayann Heman.      Any Other Special Instructions Will Be Listed Below (If Applicable).  If you need a refill on your cardiac medications before your next appointment, please call your pharmacy.

## 2017-11-28 DIAGNOSIS — D2261 Melanocytic nevi of right upper limb, including shoulder: Secondary | ICD-10-CM | POA: Diagnosis not present

## 2017-11-28 DIAGNOSIS — Z08 Encounter for follow-up examination after completed treatment for malignant neoplasm: Secondary | ICD-10-CM | POA: Diagnosis not present

## 2017-11-28 DIAGNOSIS — L570 Actinic keratosis: Secondary | ICD-10-CM | POA: Diagnosis not present

## 2017-11-28 DIAGNOSIS — Z85828 Personal history of other malignant neoplasm of skin: Secondary | ICD-10-CM | POA: Diagnosis not present

## 2017-11-28 DIAGNOSIS — D485 Neoplasm of uncertain behavior of skin: Secondary | ICD-10-CM | POA: Diagnosis not present

## 2017-12-29 ENCOUNTER — Telehealth: Payer: Self-pay | Admitting: Internal Medicine

## 2017-12-29 DIAGNOSIS — Z23 Encounter for immunization: Secondary | ICD-10-CM | POA: Diagnosis not present

## 2017-12-29 NOTE — Telephone Encounter (Signed)
New Message: ° ° ° °Patient is requesting  a call back  °

## 2017-12-30 NOTE — Telephone Encounter (Signed)
Returned call to Pt.  Pt states she continues to feel like her heart is going to fast, may be in and out of afib per Pt.  Pt states she recently had a syncopal episode in her bathroom.  Pt states she completely lost conciousness.  Pt states she recently saw her PCP Dr. Jimmye Norman.  Will obtain last PCP OV notes.  Pt with appt to see Dr. Rayann Heman tomorrow.

## 2017-12-31 ENCOUNTER — Encounter: Payer: Self-pay | Admitting: Internal Medicine

## 2017-12-31 ENCOUNTER — Other Ambulatory Visit: Payer: Self-pay

## 2017-12-31 ENCOUNTER — Encounter (HOSPITAL_COMMUNITY): Payer: Self-pay

## 2017-12-31 ENCOUNTER — Ambulatory Visit (INDEPENDENT_AMBULATORY_CARE_PROVIDER_SITE_OTHER): Payer: Medicare Other | Admitting: Internal Medicine

## 2017-12-31 ENCOUNTER — Inpatient Hospital Stay (HOSPITAL_COMMUNITY)
Admission: EM | Admit: 2017-12-31 | Discharge: 2018-01-03 | DRG: 310 | Disposition: A | Discharge status: Home / Self Care | Payer: Medicare Other | Attending: Internal Medicine | Admitting: Internal Medicine

## 2017-12-31 ENCOUNTER — Emergency Department (HOSPITAL_COMMUNITY): Payer: Medicare Other

## 2017-12-31 VITALS — BP 130/86 | HR 160 | Ht 68.0 in | Wt 203.2 lb

## 2017-12-31 DIAGNOSIS — I4891 Unspecified atrial fibrillation: Secondary | ICD-10-CM | POA: Diagnosis not present

## 2017-12-31 DIAGNOSIS — D649 Anemia, unspecified: Secondary | ICD-10-CM | POA: Diagnosis present

## 2017-12-31 DIAGNOSIS — E785 Hyperlipidemia, unspecified: Secondary | ICD-10-CM | POA: Diagnosis present

## 2017-12-31 DIAGNOSIS — F410 Panic disorder [episodic paroxysmal anxiety] without agoraphobia: Secondary | ICD-10-CM | POA: Diagnosis present

## 2017-12-31 DIAGNOSIS — Z79899 Other long term (current) drug therapy: Secondary | ICD-10-CM | POA: Diagnosis not present

## 2017-12-31 DIAGNOSIS — R000 Tachycardia, unspecified: Secondary | ICD-10-CM | POA: Diagnosis not present

## 2017-12-31 DIAGNOSIS — E876 Hypokalemia: Secondary | ICD-10-CM | POA: Diagnosis present

## 2017-12-31 DIAGNOSIS — Z7951 Long term (current) use of inhaled steroids: Secondary | ICD-10-CM | POA: Diagnosis not present

## 2017-12-31 DIAGNOSIS — Z7901 Long term (current) use of anticoagulants: Secondary | ICD-10-CM

## 2017-12-31 DIAGNOSIS — I4892 Unspecified atrial flutter: Secondary | ICD-10-CM | POA: Diagnosis present

## 2017-12-31 DIAGNOSIS — I480 Paroxysmal atrial fibrillation: Secondary | ICD-10-CM | POA: Diagnosis not present

## 2017-12-31 DIAGNOSIS — I481 Persistent atrial fibrillation: Secondary | ICD-10-CM | POA: Diagnosis present

## 2017-12-31 DIAGNOSIS — I10 Essential (primary) hypertension: Secondary | ICD-10-CM | POA: Diagnosis present

## 2017-12-31 DIAGNOSIS — K219 Gastro-esophageal reflux disease without esophagitis: Secondary | ICD-10-CM | POA: Diagnosis present

## 2017-12-31 DIAGNOSIS — J45909 Unspecified asthma, uncomplicated: Secondary | ICD-10-CM | POA: Diagnosis present

## 2017-12-31 DIAGNOSIS — Z90710 Acquired absence of both cervix and uterus: Secondary | ICD-10-CM | POA: Diagnosis not present

## 2017-12-31 LAB — CBC
HCT: 39.8 % (ref 36.0–46.0)
Hemoglobin: 12.7 g/dL (ref 12.0–15.0)
MCH: 30 pg (ref 26.0–34.0)
MCHC: 31.9 g/dL (ref 30.0–36.0)
MCV: 93.9 fL (ref 78.0–100.0)
Platelets: 245 10*3/uL (ref 150–400)
RBC: 4.24 MIL/uL (ref 3.87–5.11)
RDW: 12.8 % (ref 11.5–15.5)
WBC: 5.2 10*3/uL (ref 4.0–10.5)

## 2017-12-31 LAB — MAGNESIUM: MAGNESIUM: 2.4 mg/dL (ref 1.7–2.4)

## 2017-12-31 LAB — BASIC METABOLIC PANEL
Anion gap: 9 (ref 5–15)
BUN: 12 mg/dL (ref 8–23)
CO2: 21 mmol/L — ABNORMAL LOW (ref 22–32)
Calcium: 9.5 mg/dL (ref 8.9–10.3)
Chloride: 108 mmol/L (ref 98–111)
Creatinine, Ser: 1.03 mg/dL — ABNORMAL HIGH (ref 0.44–1.00)
GFR calc non Af Amer: 55 mL/min — ABNORMAL LOW (ref >60)
GLUCOSE: 95 mg/dL (ref 70–99)
POTASSIUM: 4.2 mmol/L (ref 3.5–5.1)
Sodium: 138 mmol/L (ref 135–145)

## 2017-12-31 MED ORDER — DILTIAZEM HCL-DEXTROSE 100-5 MG/100ML-% IV SOLN (PREMIX)
5.0000 mg/h | INTRAVENOUS | Status: DC
  Administered 2017-12-31: 5 mg/h via INTRAVENOUS
  Administered 2018-01-01: 15 mg/h via INTRAVENOUS
  Filled 2017-12-31 (×2): qty 100

## 2017-12-31 MED ORDER — BUTORPHANOL TARTRATE 10 MG/ML NA SOLN: 1.0000 | NASAL | Status: DC | PRN

## 2017-12-31 MED ORDER — CLONAZEPAM 1 MG PO TABS
1.0000 mg | ORAL_TABLET | Freq: Three times a day (TID) | ORAL | Status: DC | PRN
  Administered 2018-01-01 – 2018-01-02 (×2): 1 mg via ORAL
  Filled 2017-12-31 (×2): qty 1

## 2017-12-31 MED ORDER — SODIUM CHLORIDE 0.9 % IV SOLN: 250.0000 mL | INTRAVENOUS | Status: DC | PRN

## 2017-12-31 MED ORDER — BUTALBITAL-APAP-CAFFEINE 50-325-40 MG PO TABS: 1.0000 | ORAL_TABLET | Freq: Three times a day (TID) | ORAL | Status: DC | PRN

## 2017-12-31 MED ORDER — FERROUS SULFATE 325 (65 FE) MG PO TABS
325.0000 mg | ORAL_TABLET | Freq: Three times a day (TID) | ORAL | Status: DC
  Administered 2017-12-31 – 2018-01-03 (×8): 325 mg via ORAL
  Filled 2017-12-31 (×8): qty 1

## 2017-12-31 MED ORDER — PRAVASTATIN SODIUM 40 MG PO TABS
40.0000 mg | ORAL_TABLET | Freq: Every day | ORAL | Status: DC
  Administered 2017-12-31 – 2018-01-02 (×3): 40 mg via ORAL
  Filled 2017-12-31 (×3): qty 1

## 2017-12-31 MED ORDER — DOFETILIDE 500 MCG PO CAPS
500.0000 ug | ORAL_CAPSULE | Freq: Two times a day (BID) | ORAL | Status: DC
  Administered 2017-12-31 – 2018-01-03 (×6): 500 ug via ORAL
  Filled 2017-12-31 (×8): qty 1

## 2017-12-31 MED ORDER — SODIUM CHLORIDE 0.9% FLUSH: 3.0000 mL | INTRAVENOUS | Status: DC | PRN

## 2017-12-31 MED ORDER — MOMETASONE FURO-FORMOTEROL FUM 200-5 MCG/ACT IN AERO
2.0000 | INHALATION_SPRAY | Freq: Two times a day (BID) | RESPIRATORY_TRACT | Status: DC
  Administered 2017-12-31 – 2018-01-03 (×6): 2 via RESPIRATORY_TRACT
  Filled 2017-12-31: qty 8.8

## 2017-12-31 MED ORDER — APIXABAN 5 MG PO TABS
5.0000 mg | ORAL_TABLET | Freq: Two times a day (BID) | ORAL | Status: DC
  Administered 2017-12-31 – 2018-01-03 (×6): 5 mg via ORAL
  Filled 2017-12-31 (×6): qty 1

## 2017-12-31 MED ORDER — FOLIC ACID 1 MG PO TABS
1.0000 mg | ORAL_TABLET | Freq: Every day | ORAL | Status: DC
  Administered 2018-01-01 – 2018-01-03 (×3): 1 mg via ORAL
  Filled 2017-12-31 (×3): qty 1

## 2017-12-31 MED ORDER — ZOLPIDEM TARTRATE 5 MG PO TABS
5.0000 mg | ORAL_TABLET | Freq: Every day | ORAL | Status: DC
  Administered 2017-12-31 – 2018-01-02 (×3): 5 mg via ORAL
  Filled 2017-12-31 (×3): qty 1

## 2017-12-31 MED ORDER — SODIUM CHLORIDE 0.9% FLUSH
3.0000 mL | Freq: Two times a day (BID) | INTRAVENOUS | Status: DC
  Administered 2018-01-01 – 2018-01-03 (×4): 3 mL via INTRAVENOUS

## 2017-12-31 MED ORDER — DILTIAZEM LOAD VIA INFUSION
10.0000 mg | Freq: Once | INTRAVENOUS | Status: AC
  Administered 2017-12-31: 10 mg via INTRAVENOUS
  Filled 2017-12-31: qty 10

## 2017-12-31 NOTE — Progress Notes (Signed)
PCP: Harvie Junior, MD   Primary EP: Dr Ola Spurr at [facility] is a 67 y.o. female who presents today for routine electrophysiology followup.  She reports having syncope about 2 weeks ago.  Over the past 2 weeks, she has had tachypalpitations and fatigue. Today, she denies symptoms of chest pain, shortness of breath,  lower extremity edema or further syncope.  The patient is otherwise without complaint today.   Past Medical History:  Diagnosis Date  . Anticoagulant long-term use 11/16/2014  . Anxiety 10/06/2017  . Arthritis 10/06/2017   knees bilateral  . Asthma 11/16/2014  . Esophageal reflux 11/16/2014  . Essential hypertension 08/15/2015   pt denies  . H/O: GI bleed 12/11/2016  . Hyperlipidemia 11/17/2014  . Migraine headache 11/16/2014  . Nausea 11/16/2014  . Pain in limb 11/16/2014  . Panic disorder 11/16/2014  . Paroxysmal atrial fibrillation ([place]) 11/17/2014  . Swelling of joint 11/16/2014  . Symptomatic anemia 11/24/2016  . Tendonitis 11/16/2014  . UTI (urinary tract infection) 12/19/2015   Past Surgical History:  Procedure Laterality Date  . ABDOMINAL HYSTERECTOMY    . CARDIOVERSION    . CESAREAN SECTION    . COLONOSCOPY    . ROTATOR CUFF REPAIR Left     ROS- all systems are reviewed and negatives except as per HPI above  Current Outpatient Medications  Medication Sig Dispense Refill  . butalbital-acetaminophen-caffeine (ESGIC) 50-325-40 MG tablet Take 1 tablet by mouth every 8 (eight) hours as needed for headache.    . butorphanol (STADOL) 10 MG/ML nasal spray Place 1 spray into the nose every 4 (four) hours as needed for headache.    . clonazePAM (KLONOPIN) 1 MG tablet Take 1 mg by mouth 3 (three) times daily as needed for anxiety.    Marland Kitchen diltiazem (TIAZAC) 120 MG 24 hr capsule Take 120 mg by mouth daily.    Marland Kitchen ELIQUIS 5 MG TABS tablet Take 5 mg by mouth 2 (two) times daily.  6  . ferrous sulfate 325 (65 FE) MG tablet Take 325 mg by mouth 3  (three) times daily.  5  . fluticasone-salmeterol (ADVAIR HFA) 115-21 MCG/ACT inhaler Inhale 2 puffs into the lungs 2 (two) times daily.    . folic acid (FOLVITE) 1 MG tablet Take 1 mg by mouth daily.  6  . pravastatin (PRAVACHOL) 40 MG tablet Take 40 mg by mouth at bedtime.    . promethazine (PHENERGAN) 25 MG tablet Take 25 mg by mouth every 6 (six) hours as needed for nausea or vomiting.    Marland Kitchen zolpidem (AMBIEN) 5 MG tablet Take 5 mg by mouth at bedtime.     No current facility-administered medications for this visit.     Physical Exam: Vitals:   12/31/17 1647  BP: 130/86  Pulse: (!) 160  SpO2: 98%  Weight: 203 lb 3.2 oz (92.2 kg)  Height: 5\' 8"  (1.727 m)    GEN- The patient is well appearing, alert and oriented x 3 today.   Head- normocephalic, atraumatic Eyes-  Sclera clear, conjunctiva pink Ears- hearing intact Oropharynx- clear Lungs- Clear to ausculation bilaterally, normal work of breathing Heart- tachycardic irregular rhythm GI- soft, NT, ND, + BS Extremities- no clubbing, cyanosis, or edema  Wt Readings from Last 3 Encounters:  12/31/17 203 lb 3.2 oz (92.2 kg)  11/24/17 207 lb 3.2 oz (94 kg)  10/13/17 217 lb (98.4 kg)    EKG tracing ordered today is personally reviewed and shows coarse  afib, V rate 160 bpm  Assessment and Plan:  1. afib She is in afib today with RVR. chads2vasc sore is 3.  She reports compliance with eliquis I would advise admission for tikosyn Qt appears stable to start the medicine She has not taken amiodarone since 01/2017  2. Anemia Defer to hematology She reports compliance with eliquis without interruption over the past 3 months  3. Syncope Due to afib with RVR vs post termination pause No driving (pt aware)  Ad[facility] to [facility] at this time for initiation of AAD therapy.  [name] MD, [facility] 12/31/2017 4:55 PM

## 2017-12-31 NOTE — Patient Instructions (Signed)
Dofetilide capsules What is this medicine? DOFETILIDE (doe FET il ide) is an antiarrhythmic drug. It helps make your heart beat regularly. This medicine also helps to slow rapid heartbeats. This medicine may be used for other purposes; ask your health care provider or pharmacist if you have questions. COMMON BRAND NAME(S): Tikosyn What should I tell my health care provider before I take this medicine? They need to know if you have any of these conditions: -heart disease -history of low levels of potassium or magnesium -kidney disease -liver disease -an unusual or allergic reaction to dofetilide, other medicines, foods, dyes, or preservatives -pregnant or trying to get pregnant -breast-feeding How should I use this medicine? Take this medicine by mouth with a glass of water. Follow the directions on the prescription label. You can take this medicine with or without food. Do not drink grapefruit juice with this medicine. Take your doses at regular intervals. Do not take your medicine more often than directed. Do not stop taking this medicine suddenly. This may cause serious, heart-related side effects. Your doctor will tell you how much medicine to take. If your doctor wants you to stop the medicine, the dose will be slowly lowered over time to avoid any side effects. Talk to your pediatrician regarding the use of this medicine in children. Special care may be needed. Overdosage: If you think you have taken too much of this medicine contact a poison control center or emergency room at once. NOTE: This medicine is only for you. Do not share this medicine with others. What if I miss a dose? If you miss a dose, take it as soon as you can. If it is almost time for your next dose, take only that dose. Do not take double or extra doses. What may interact with this medicine? Do not take this medicine with any of the following medications: -cimetidine -dolutegravir -hydrochlorothiazide alone or in  combination with other medicines -isavuconazonium -ketoconazole -megestrol -other medicines that prolong the QT interval (cause an abnormal heart rhythm) -prochlorperazine -trimethoprim alone or in combination with sulfamethoxazole -verapamil This medicine may also interact with the following medications: -amiloride -certain antidepressants like fluvoxamine or paroxetine -certain antiviral medicines for HIV or AIDS like atazanavir or darunavir -certain medicines for fungal infections like clotrimazole or miconazole -digoxin -diltiazem -dronabinol, THC -grapefruit juice -metformin -nefazodone -triamterene -zafirlukast This list may not describe all possible interactions. Give your health care provider a list of all the medicines, herbs, non-prescription drugs, or dietary supplements you use. Also tell them if you smoke, drink alcohol, or use illegal drugs. Some items may interact with your medicine. What should I watch for while using this medicine? Visit your doctor or health care professional for regular checks on your progress. Wear a medical ID bracelet or chain, and carry a card that describes your disease and details of your medicine and dosage times. Check your heart rate and blood pressure regularly while you are taking this medicine. Ask your doctor or health care professional what your heart rate and blood pressure should be, and when you should contact him or her. Your doctor or health care professional also may schedule regular tests to check your progress. You will be started on this medicine in a specialized facility for at least three days. You will be monitored to find the right dose of medicine for you. It is very important that you take your medicine exactly as prescribed when you leave the hospital. The correct dosing of this medicine is very important  to treat your condition and prevent possible serious side effects. What side effects may I notice from receiving this  medicine? Side effects that you should report to your doctor or health care professional as soon as possible: -allergic reactions like skin rash, itching or hives, swelling of the face, lips, or tongue -breathing problems -dizziness -fast or rapid beating of the heart -feeling faint or lightheaded -swelling of the ankles -unusually weak or tired -vomiting Side effects that usually do not require medical attention (report to your doctor or health care professional if they continue or are bothersome): -cough -diarrhea -difficulty sleeping -headache -nausea -stomach pain This list may not describe all possible side effects. Call your doctor for medical advice about side effects. You may report side effects to FDA at 1-800-FDA-1088. Where should I keep my medicine? Keep out of the reach of children. Store at room temperature between 15 and 30 degrees C (59 and 86 degrees F). Protect the medicine from moisture or humidity. Keep container tightly closed. Throw away any unused medicine after the expiration date. NOTE: This sheet is a summary. It may not cover all possible information. If you have questions about this medicine, talk to your doctor, pharmacist, or health care provider.  2018 Elsevier/Gold Standard (2016-02-19 16:10:96)

## 2017-12-31 NOTE — Progress Notes (Signed)
Pharmacy Review for Dofetilide (Tikosyn) Initiation  Admit Complaint: 67 y.o. female admitted 12/31/2017 with atrial fibrillation to be initiated on dofetilide.   Assessment:  Patient Exclusion Criteria: If any screening criteria checked as "Yes", then  patient  should NOT receive dofetilide until criteria item is corrected. If "Yes" please indicate correction plan.  YES  NO Patient  Exclusion Criteria Correction Plan  []  [x]  Baseline QTc interval is greater than or equal to 440 msec. IF above YES box checked dofetilide contraindicated unless patient has ICD; then may proceed if QTc 500-550 msec or with known ventricular conduction abnormalities may proceed with QTc 550-600 msec. QTc = 438 on EKG 12/31/17 at 1942   []  [x]  Magnesium level is less than 1.8 mEq/l : Last magnesium: 2.4     []  [x]  Potassium level is less than 4 mEq/l : Last potassium: 4.2    []  [x]  Patient is known or suspected to have a digoxin level greater than 2 ng/ml: No results found for: DIGOXIN    []  [x]  Creatinine clearance less than 20 ml/min (calculated using Cockcroft-Gault, actual body weight and serum creatinine): 62.9 mL/min   []  [x]  Patient has received drugs known to prolong the QT intervals within the last 48 hours (phenothiazines, tricyclics or tetracyclic antidepressants, erythromycin, H-1 antihistamines, cisapride, fluoroquinolones, azithromycin). Drugs not listed above may have an, as yet, undetected potential to prolong the QT interval, updated information on QT prolonging agents is available at this website:QT prolonging agents   []  [x]  Patient received a dose of hydrochlorothiazide (Oretic) alone or in any combination including triamterene (Dyazide, Maxzide) in the last 48 hours.   []  [x]  Patient received a medication known to increase dofetilide plasma concentrations prior to initial dofetilide dose:  . Trimethoprim (Primsol, Proloprim) in the last 36 hours . Verapamil (Calan, Verelan) in the last 36  hours or a sustained release dose in the last 72 hours . Megestrol (Megace) in the last 5 days  . Cimetidine (Tagamet) in the last 6 hours . Ketoconazole (Nizoral) in the last 24 hours . Itraconazole (Sporanox) in the last 48 hours  . Prochlorperazine (Compazine) in the last 36 hours    []  [x]  Patient is known to have a history of torsades de pointes; congenital or acquired long QT syndromes.   []  [x]  Patient has received a Class 1 antiarrhythmic with less than 2 half-lives since last dose. (Disopyramide, Quinidine, Procainamide, Lidocaine, Mexiletine, Flecainide, Propafenone)   []  [x]  Patient has received amiodarone therapy in the past 3 months or amiodarone level is greater than 0.3 ng/ml. - Per Dr. Jackalyn Lombard progress note, not on since 01/2017.    Patient has been appropriately anticoagulated with Eliquis.  Ordering provider was confirmed at [email or web address] if they are not listed on the [facility] Prescribers list.  Goal of Therapy: Follow renal function, electrolytes, potential drug interactions, and dose adjustment. Provide education and 1 week supply at discharge.  Plan:  [x]   Physician selected initial dose within range recommended for patients level of renal function - will monitor for response.  []   Physician selected initial dose outside of range recommended for patients level of renal function - will discuss if the dose should be altered at this time.   Select One Calculated CrCl  Dose q12h  [x]  > 60 ml/min 500 mcg  []  40-60 ml/min 250 mcg  []  20-40 ml/min 125 mcg   2. Follow up QTc after the first 5 doses, renal function, electrolytes (K & Mg) daily x  3     days, dose adjustment, success of initiation and facilitate 1 week discharge supply as     clinically indicated.  3. Initiate Tikosyn education video (Call [phone] and ask for Tikosyn Video # [phone]).  4. Place Enrollment Form on the chart for discharge supply of dofetilide.   Brain Hilts 8:13 PM  12/31/2017

## 2017-12-31 NOTE — ED Provider Notes (Signed)
Patient placed in Quick Look pathway, seen and evaluated   Chief Complaint: A-fib  HPI:   Nicole [name] is a 67 y.o. female who presents to the ED from her MD office for A-fib. He told the patient to come to the ED for admission to start new medication. Patient reports she had an ablation in October 2018.   ROS: CV: palpations, rapid heart beat  Physical Exam: BP (!) 146/112 (BP Location: Right Arm)   Pulse 68   Temp 98.1 F (36.7 C) (Oral)   Resp 16   SpO2 100%     Gen: No distress  Neuro: Awake and Alert  Skin: Warm and dry  EKG: A-fib with rapid ventricular response.  Initiation of care has begun. The patient has been counseled on the process, plan, and necessity for staying for the completion/evaluation, and the remainder of the medical screening examination    Ashley Murrain, NP 12/31/17 1944    Tegeler, Gwenyth Allegra, MD 01/01/18 [phone]

## 2017-12-31 NOTE — Progress Notes (Signed)
Patient is here for Tikosyn load. Dr. Rayann Heman to write H&P. Seen earlier in clinic. Send to ER as no bed was available for direct admission. Pend labs. Orders done. Plan to start IV cardizem as well.

## 2017-12-31 NOTE — ED Provider Notes (Signed)
[facility] PROGRESSIVE CARE Provider Note   CSN: [id number] Arrival date & time: 12/31/17  1811     History   Chief Complaint Chief Complaint  Patient presents with  . Atrial Fibrillation    HPI Nicole [name] is a 67 y.o. female.  The history is provided by the patient and medical records. No language interpreter was used.  Atrial Fibrillation  Associated symptoms include chest pain and shortness of breath.   Nicole [name] is a 67 y.o. female  with a PMH as listed below who presents to the Emergency Department from cardiology clinic for concerns of palpitations.  Patient was seen by her cardiologist who noticed that her heart rate was very elevated (in A. fib with RVR) and recommended that she come to emergency department to begin rate control treatment.  Patient states that she has noticed palpitations for the last 3 days.  She has been taking her typical medication regimen including p.o. Cardizem.  No other medications prior to arrival.  She currently feels as if her heart is beating fast and will intermittently have some trouble breathing, but no other complaints.  Past Medical History:  Diagnosis Date  . Anticoagulant long-term use 11/16/2014  . Anxiety 10/06/2017  . Arthritis 10/06/2017   knees bilateral  . Asthma 11/16/2014  . Esophageal reflux 11/16/2014  . Essential hypertension 08/15/2015   pt denies  . H/O: GI bleed 12/11/2016  . Hyperlipidemia 11/17/2014  . Migraine headache 11/16/2014  . Nausea 11/16/2014  . Pain in limb 11/16/2014  . Panic disorder 11/16/2014  . Paroxysmal atrial fibrillation ([place]) 11/17/2014  . Swelling of joint 11/16/2014  . Symptomatic anemia 11/24/2016  . Tendonitis 11/16/2014  . UTI (urinary tract infection) 12/19/2015    Patient Active Problem List   Diagnosis Date Noted  . Atrial flutter with rapid ventricular response ([place]) 12/31/2017  . Anxiety 10/06/2017  . Chest pain 10/06/2017  . Arthritis 10/06/2017  . H/O: GI bleed 12/11/2016    . GI bleed 11/24/2016  . Symptomatic anemia 11/24/2016  . Generalized weakness 07/05/2016  . Atrial fibrillation, chronic ([place]) 07/05/2016  . Chest discomfort 12/19/2015  . UTI (urinary tract infection) 12/19/2015  . Essential hypertension 08/15/2015  . Hyperlipidemia 11/17/2014  . Paroxysmal atrial fibrillation ([place]) 11/17/2014  . Anticoagulant long-term use 11/16/2014  . Asthma 11/16/2014  . Esophageal reflux 11/16/2014  . Migraine headache 11/16/2014  . Nausea 11/16/2014  . Pain in limb 11/16/2014  . Panic disorder 11/16/2014  . Swelling of joint 11/16/2014  . Tendonitis 11/16/2014    Past Surgical History:  Procedure Laterality Date  . ABDOMINAL HYSTERECTOMY    . CARDIOVERSION    . CESAREAN SECTION    . COLONOSCOPY    . ROTATOR CUFF REPAIR Left      OB History   None      Home Medications    Prior to Admission medications   Medication Sig Start Date End Date Taking? Authorizing Provider  butalbital-acetaminophen-caffeine (ESGIC) 50-325-40 MG tablet Take 1 tablet by mouth every 8 (eight) hours as needed for headache.   Yes [provider]  butorphanol (STADOL) 10 MG/ML nasal spray Place 1 spray into the nose every 4 (four) hours as needed for headache.   Yes [provider]  clonazePAM (KLONOPIN) 1 MG tablet Take 1 mg by mouth 3 (three) times daily.    Yes [provider]  diltiazem (TIAZAC) 120 MG 24 hr capsule Take 120 mg by mouth daily.   Yes [provider]  ELIQUIS 5 MG TABS tablet Take 5 mg by mouth 2 (two) times daily. 09/24/17  Yes [provider]  ferrous sulfate 325 (65 FE) MG tablet Take 325 mg by mouth 3 (three) times daily. 09/24/17  Yes [provider]  fluticasone-salmeterol (ADVAIR HFA) 115-21 MCG/ACT inhaler Inhale 2 puffs into the lungs 2 (two) times daily.   Yes [provider]  folic acid (FOLVITE) 1 MG tablet Take 1 mg by mouth daily. 09/23/17  Yes [provider]  pravastatin  (PRAVACHOL) 40 MG tablet Take 40 mg by mouth at bedtime.   Yes [provider]  promethazine (PHENERGAN) 25 MG tablet Take 25 mg by mouth every 6 (six) hours as needed for nausea or vomiting.   Yes [provider]  zolpidem (AMBIEN) 5 MG tablet Take 5 mg by mouth at bedtime.   Yes [provider]    Family History Family History  Problem Relation Age of Onset  . Heart attack Mother   . Heart disease Mother   . Heart disease Sister   . Heart disease Sister   . Heart disease Sister   . Heart disease Sister   . Heart disease Sister     Social History Social History   Tobacco Use  . Smoking status: Never Smoker  . Smokeless tobacco: Never Used  Substance Use Topics  . Alcohol use: Never    Frequency: Never  . Drug use: Never     Allergies   Meperidine and Prednisone   Review of Systems Review of Systems  Respiratory: Positive for shortness of breath.   Cardiovascular: Positive for chest pain and palpitations.  All other systems reviewed and are negative.    Physical Exam Updated Vital Signs BP (!) 114/97   Pulse 77   Temp 98.1 F (36.7 C) (Oral)   Resp 15   SpO2 100%   Physical Exam  Constitutional: She is oriented to person, place, and time. She appears well-developed and well-nourished. No distress.  HENT:  Head: Normocephalic and atraumatic.  Cardiovascular: Normal heart sounds.  No murmur heard. Tachycardic, irregular.  Pulmonary/Chest: Effort normal and breath sounds normal. No respiratory distress.  Abdominal: Soft. She exhibits no distension. There is no tenderness.  Musculoskeletal: She exhibits no edema.  Neurological: She is alert and oriented to person, place, and time.  Skin: Skin is warm and dry.  Nursing note and vitals reviewed.    ED Treatments / Results  Labs (all labs ordered are listed, but only abnormal results are displayed) Labs Reviewed  BASIC METABOLIC PANEL - Abnormal; Notable for the following  components:      Result Value   CO2 21 (*)    Creatinine, Ser 1.03 (*)    GFR calc non Af Amer 55 (*)    All other components within normal limits  CBC  MAGNESIUM  HIV ANTIBODY (ROUTINE TESTING)  BASIC METABOLIC PANEL  MAGNESIUM    EKG None  Radiology Dg Chest Portable 1 View  Result Date: 12/31/2017 CLINICAL DATA:  Atrial fibrillation, dyspnea EXAM: PORTABLE CHEST 1 VIEW COMPARISON:  None. FINDINGS: Normal heart size. Mildly atherosclerotic thoracic aorta. Otherwise normal mediastinal contour. No pneumothorax. No pleural effusion. Lungs appear clear, with no acute consolidative airspace disease and no pulmonary edema. IMPRESSION: No active disease. Electronically Signed   By: Ilona Sorrel M.D.   On: 12/31/2017 20:47    Procedures Procedures (including critical care time)  Medications Ordered in ED Medications  sodium chloride flush (  NS) 0.9 % injection 3 mL (has no administration in time range)  sodium chloride flush (NS) 0.9 % injection 3 mL (has no administration in time range)  0.9 %  sodium chloride infusion (has no administration in time range)  butalbital-acetaminophen-caffeine (FIORICET, ESGIC) 50-325-40 MG per tablet 1 tablet (has no administration in time range)  butorphanol (STADOL) nasal spray 1 spray (has no administration in time range)  pravastatin (PRAVACHOL) tablet 40 mg (has no administration in time range)  zolpidem (AMBIEN) tablet 5 mg (has no administration in time range)  apixaban (ELIQUIS) tablet 5 mg (has no administration in time range)  ferrous sulfate tablet 325 mg (has no administration in time range)  folic acid (FOLVITE) tablet 1 mg (1 mg Oral Not Given 12/31/17 2124)  mometasone-formoterol (DULERA) 200-5 MCG/ACT inhaler 2 puff (has no administration in time range)  diltiazem (CARDIZEM) 1 mg/mL load via infusion 10 mg (10 mg Intravenous Bolus from Bag 12/31/17 2033)    And  diltiazem (CARDIZEM) 100 mg in dextrose 5% 129mL (1 mg/mL) infusion (5 mg/hr  Intravenous New Bag/Given 12/31/17 2032)     Initial Impression / Assessment and Plan / ED Course  I have reviewed the triage vital signs and the nursing notes.  Pertinent labs & imaging results that were available during my care of the patient were reviewed by me and considered in my medical decision making (see chart for details).    Nicole [name] is a 67 y.o. female who presents to ED cardiology clinic for A. fib with RVR.  Cardiology evaluated and will admit.   Final Clinical Impressions(s) / ED Diagnoses   Final diagnoses:  Atrial fibrillation with RVR [facility])    ED Discharge Orders    None       Ward, Ozella Almond, PA-C 12/31/17 2129    Tegeler, Gwenyth Allegra, MD 01/01/18 [phone]

## 2017-12-31 NOTE — ED Triage Notes (Signed)
Pt was seen at PCP today, found to be in afib RVR, HR 167, primary wants her to start on new medication. Denies CP, no SOB

## 2017-12-31 NOTE — H&P (Signed)
PCP: Harvie Junior, MD   Primary EP: Dr Ola Spurr at [facility] is a 67 y.o. female who jis admitted with symptomatic afib with RVR.  She reports having syncope about 2 weeks ago.  Over the past 2 weeks, she has had tachypalpitations and fatigue. Today, she denies symptoms of chest pain, shortness of breath,  lower extremity edema or further syncope.  The patient is otherwise without complaint today.       Past Medical History:  Diagnosis Date  . Anticoagulant long-term use 11/16/2014  . Anxiety 10/06/2017  . Arthritis 10/06/2017   knees bilateral  . Asthma 11/16/2014  . Esophageal reflux 11/16/2014  . Essential hypertension 08/15/2015   pt denies  . H/O: GI bleed 12/11/2016  . Hyperlipidemia 11/17/2014  . Migraine headache 11/16/2014  . Nausea 11/16/2014  . Pain in limb 11/16/2014  . Panic disorder 11/16/2014  . Paroxysmal atrial fibrillation ([place]) 11/17/2014  . Swelling of joint 11/16/2014  . Symptomatic anemia 11/24/2016  . Tendonitis 11/16/2014  . UTI (urinary tract infection) 12/19/2015        Past Surgical History:  Procedure Laterality Date  . ABDOMINAL HYSTERECTOMY    . CARDIOVERSION    . CESAREAN SECTION    . COLONOSCOPY    . ROTATOR CUFF REPAIR Left     ROS- all systems are reviewed and negatives except as per HPI above        Current Outpatient Medications  Medication Sig Dispense Refill  . butalbital-acetaminophen-caffeine (ESGIC) 50-325-40 MG tablet Take 1 tablet by mouth every 8 (eight) hours as needed for headache.    . butorphanol (STADOL) 10 MG/ML nasal spray Place 1 spray into the nose every 4 (four) hours as needed for headache.    . clonazePAM (KLONOPIN) 1 MG tablet Take 1 mg by mouth 3 (three) times daily as needed for anxiety.    Marland Kitchen diltiazem (TIAZAC) 120 MG 24 hr capsule Take 120 mg by mouth daily.    Marland Kitchen ELIQUIS 5 MG TABS tablet Take 5 mg by mouth 2 (two) times daily.  6  . ferrous sulfate 325 (65 FE) MG  tablet Take 325 mg by mouth 3 (three) times daily.  5  . fluticasone-salmeterol (ADVAIR HFA) 115-21 MCG/ACT inhaler Inhale 2 puffs into the lungs 2 (two) times daily.    . folic acid (FOLVITE) 1 MG tablet Take 1 mg by mouth daily.  6  . pravastatin (PRAVACHOL) 40 MG tablet Take 40 mg by mouth at bedtime.    . promethazine (PHENERGAN) 25 MG tablet Take 25 mg by mouth every 6 (six) hours as needed for nausea or vomiting.    Marland Kitchen zolpidem (AMBIEN) 5 MG tablet Take 5 mg by mouth at bedtime.     No current facility-administered medications for this visit.     Physical Exam:    Vitals:   12/31/17 1647  BP: 130/86  Pulse: (!) 160  SpO2: 98%  Weight: 203 lb 3.2 oz (92.2 kg)  Height: 5\' 8"  (1.727 m)    GEN- The patient is well appearing, alert and oriented x 3 today.   Head- normocephalic, atraumatic Eyes-  Sclera clear, conjunctiva pink Ears- hearing intact Oropharynx- clear Lungs- Clear to ausculation bilaterally, normal work of breathing Heart- tachycardic irregular rhythm GI- soft, NT, ND, + BS Extremities- no clubbing, cyanosis, or edema     Wt Readings from Last 3 Encounters:  12/31/17 203 lb 3.2 oz (92.2 kg)  11/24/17 207 lb 3.2 oz (  94 kg)  10/13/17 217 lb (98.4 kg)    EKG tracing ordered today is personally reviewed and shows coarse afib, V rate 160 bpm  Assessment and Plan:  1. afib She is in afib today with RVR. chads2vasc sore is 3.  She reports compliance with eliquis I would advise admission for tikosyn Qt appears stable to start the medicine She has not taken amiodarone since 01/2017  2. Anemia Defer to hematology She reports compliance with eliquis without interruption over the past 3 months  3. Syncope Due to afib with RVR vs post termination pause No driving (pt aware)  Ad[facility] to [facility] at this time for initiation of AAD therapy.  [name] MD, [facility] 12/31/2017 10:05 PM

## 2018-01-01 LAB — BASIC METABOLIC PANEL
ANION GAP: 8 (ref 5–15)
BUN: 10 mg/dL (ref 8–23)
CALCIUM: 8.9 mg/dL (ref 8.9–10.3)
CO2: 22 mmol/L (ref 22–32)
Chloride: 108 mmol/L (ref 98–111)
Creatinine, Ser: 0.88 mg/dL (ref 0.44–1.00)
Glucose, Bld: 93 mg/dL (ref 70–99)
POTASSIUM: 3.3 mmol/L — AB (ref 3.5–5.1)
Sodium: 138 mmol/L (ref 135–145)

## 2018-01-01 LAB — MAGNESIUM: Magnesium: 2.3 mg/dL (ref 1.7–2.4)

## 2018-01-01 LAB — MRSA PCR SCREENING: MRSA BY PCR: NEGATIVE

## 2018-01-01 LAB — POTASSIUM: Potassium: 4.7 mmol/L (ref 3.5–5.1)

## 2018-01-01 MED ORDER — POTASSIUM CHLORIDE CRYS ER 20 MEQ PO TBCR
30.0000 meq | EXTENDED_RELEASE_TABLET | ORAL | Status: AC
  Administered 2018-01-01 (×3): 30 meq via ORAL
  Filled 2018-01-01 (×3): qty 1

## 2018-01-01 NOTE — Care Management (Signed)
01-01-18  BENEFITS CHECK :  #  3.   S/W   JEANNIE    @  [place]  RX # [phone]  1. TIKOSYN  125 MCG , 250 MCG AND  500 MCG BID COVER- NOT [place]  # [phone]  2. DOFETILIDE  125 MCG BID COVER- YES CO-PAY- $ 107.86 TIER- 4 DRUG PRIOR APPROVAL- NO  3. DOFETILIDE  250 MC G BID COVER- YES CO-PAY- $ 101.42 TIER- 4 DRUG PRIOR APPROVAL- NO  4. DOFETILIDE  500 MCG BID COVER- YES CO-PAY- $ 94.09 TIER- 4 DRUG PRIOR APPROVAL- NO  PATIENT IN COVERAGE GAP/ DONUT HOLE  PREFERRED PHARMACY ; YES [facility], [facility] AND [facility]

## 2018-01-01 NOTE — Progress Notes (Signed)
Patient converted to NSR.  Dr. Paticia Stack with cariology made aware.  Verbal order received to stop cardizem gtt.

## 2018-01-01 NOTE — Progress Notes (Addendum)
Progress Note  Patient Name: Nicole [name] Date of Encounter: 01/01/2018  Primary Electrophysiologist: Dr. Rayann [name]   Subjective   Feels much better.  Knew right away she converted  Inpatient Medications    Scheduled Meds: . apixaban  5 mg Oral BID  . dofetilide  500 mcg Oral BID  . ferrous sulfate  325 mg Oral TID  . folic acid  1 mg Oral Daily  . mometasone-formoterol  2 puff Inhalation BID  . potassium chloride  30 mEq Oral Q1 Hr x 3  . pravastatin  40 mg Oral QHS  . sodium chloride flush  3 mL Intravenous Q12H  . zolpidem  5 mg Oral QHS   Continuous Infusions: . sodium chloride    . diltiazem (CARDIZEM) infusion Stopped (01/01/18 0010)   PRN Meds: sodium chloride, butalbital-acetaminophen-caffeine, butorphanol, clonazePAM, sodium chloride flush   Vital Signs    Vitals:   01/01/18 0200 01/01/18 0447 01/01/18 0819 01/01/18 0855  BP:  (!) 102/58 124/75   Pulse: 68 73 80   Resp: 17 19 16    Temp:  (!) 97.5 F (36.4 C) 98 F (36.7 C)   TempSrc:  Oral Oral   SpO2: 96% 97% 100% 100%  Weight:      Height:        Intake/Output Summary (Last 24 hours) at 01/01/2018 1018 Last data filed at 01/01/2018 0600 Gross per 24 hour  Intake 54.35 ml  Output -  Net 54.35 ml   Filed Weights   12/31/17 2200  Weight: 92.4 kg    Telemetry    AFib w/RVR >>> SR 70's - Personally Reviewed  ECG    SR 76bpm, reviewed with Dr. Curt [name], QT measured 477ms, QTc 433ms   Physical Exam   GEN: No acute distress.   Neck: No JVD Cardiac: RRR, no murmurs, rubs, or gallops.  Respiratory: CTA b/l. GI: Soft, nontender, non-distended  MS: No edema; No deformity. Neuro:  Nonfocal  Psych: Normal affect   Labs    Chemistry Recent Labs  Lab 12/31/17 1944 01/01/18 0233  NA 138 138  K 4.2 3.3*  CL 108 108  CO2 21* 22  GLUCOSE 95 93  BUN 12 10  CREATININE 1.03* 0.88  CALCIUM 9.5 8.9  GFRNONAA 55* >60  GFRAA >60 >60  ANIONGAP 9 8     Hematology Recent Labs  Lab  12/31/17 1944  WBC 5.2  RBC 4.24  HGB 12.7  HCT 39.8  MCV 93.9  MCH 30.0  MCHC 31.9  RDW 12.8  PLT 245    Cardiac EnzymesNo results for input(s): TROPONINI in the last 168 hours. No results for input(s): TROPIPOC in the last 168 hours.   BNPNo results for input(s): BNP, PROBNP in the last 168 hours.   DDimer No results for input(s): DDIMER in the last 168 hours.   Radiology    Dg Chest Portable 1 View Result Date: 12/31/2017 CLINICAL DATA:  Atrial fibrillation, dyspnea EXAM: PORTABLE CHEST 1 VIEW COMPARISON:  None. FINDINGS: Normal heart size. Mildly atherosclerotic thoracic aorta. Otherwise normal mediastinal contour. No pneumothorax. No pleural effusion. Lungs appear clear, with no acute consolidative airspace disease and no pulmonary edema. IMPRESSION: No active disease. Electronically Signed   By: Nicole [name] M.D.   On: 12/31/2017 20:47    Cardiac Studies   07/08/16: [facility]) Conclusions Summary Normal left ventricular systolic function Mild concentric left ventricular hypertrophy No significant Doppler findings. No significant valvular abnormalities.  Patient Profile  67 y.o. female w/PMHx of HTN (?), HLD, asthma, anxiety, anemia, PAFib, admitted with rapid AFib/flutter loading Tikosyn  Assessment & Plan    1. Syncope (weeks ago)     Suspect 2/2 RAFib vs possible post termination pause     AF >> SR here without pause, sinus rates 70's  2. Rapid AFib     CHA2DS2Vasc is 3, on Eliquis with reported compliance     Started on Tikosyn last PM >>> SR     K+ 3.3 replacement ordered     Mag 2.3     Creat 0.88  3. HTN     Looks OK  For questions or updates, please contact [facility] Please consult [email or web address] for contact info under        Signed, Nicole Jamaica, PA-C  01/01/2018, 10:18 AM    I have seen and examined this patient with Nicole [name].  Agree with above, note added to reflect my findings.  On exam, RRR, no murmurs, lungs clear.   Admitted to the hospital initially with atrial fibrillation and rapid rates for dofetilide loading.  After her first dose of dofetilide she converted to sinus rhythm.  Would continue with the current dose.  Nicole Desena M. Dareon Nunziato MD 01/01/2018 2:16 PM

## 2018-01-02 LAB — BASIC METABOLIC PANEL
Anion gap: 7 (ref 5–15)
BUN: 8 mg/dL (ref 8–23)
CO2: 22 mmol/L (ref 22–32)
CREATININE: 0.77 mg/dL (ref 0.44–1.00)
Calcium: 9.1 mg/dL (ref 8.9–10.3)
Chloride: 108 mmol/L (ref 98–111)
GFR calc Af Amer: >60 mL/min (ref >60)
Glucose, Bld: 86 mg/dL (ref 70–99)
Potassium: 4.2 mmol/L (ref 3.5–5.1)
SODIUM: 137 mmol/L (ref 135–145)

## 2018-01-02 LAB — MAGNESIUM: MAGNESIUM: 2.2 mg/dL (ref 1.7–2.4)

## 2018-01-02 LAB — HIV ANTIBODY (ROUTINE TESTING W REFLEX): HIV SCREEN 4TH GENERATION: NONREACTIVE

## 2018-01-02 NOTE — Progress Notes (Addendum)
Progress Note  Patient Name: Nicole [name] Date of Encounter: 01/02/2018  Primary Electrophysiologist: Dr. Rayann Heman   Subjective   Continues to feel very well, ambulated on/off in the hallway much of the day yesterday without difficulty or symptoms  Inpatient Medications    Scheduled Meds: . apixaban  5 mg Oral BID  . dofetilide  500 mcg Oral BID  . ferrous sulfate  325 mg Oral TID  . folic acid  1 mg Oral Daily  . mometasone-formoterol  2 puff Inhalation BID  . pravastatin  40 mg Oral QHS  . sodium chloride flush  3 mL Intravenous Q12H  . zolpidem  5 mg Oral QHS   Continuous Infusions: . sodium chloride    . diltiazem (CARDIZEM) infusion Stopped (01/01/18 0010)   PRN Meds: sodium chloride, butalbital-acetaminophen-caffeine, butorphanol, clonazePAM, sodium chloride flush   Vital Signs    Vitals:   01/02/18 0352 01/02/18 0722 01/02/18 0800 01/02/18 0806  BP: 130/71 122/79    Pulse: 77 75    Resp: 15 16 19    Temp: 97.9 F (36.6 C) 98 F (36.7 C)    TempSrc: Oral Oral    SpO2: 98% 99%  99%  Weight:      Height:        Intake/Output Summary (Last 24 hours) at 01/02/2018 0948 Last data filed at 01/01/2018 2000 Gross per 24 hour  Intake 240 ml  Output -  Net 240 ml   Filed Weights   12/31/17 2200  Weight: 92.4 kg    Telemetry    SR 70's once 8beat PAFlutter - Personally Reviewed  ECG    SR 72bpm, reviewed with Dr. Curt Bears, QT measured 467ms, QTc 486ms   Physical Exam   GEN: No acute distress.   Neck: No JVD Cardiac: RRR, no murmurs, rubs, or gallops.  Respiratory: CTA b/l. GI: Soft, nontender, non-distended  MS: No edema; No deformity. Neuro:  Nonfocal  Psych: Normal affect   Labs    Chemistry Recent Labs  Lab 12/31/17 1944 01/01/18 0233 01/01/18 1355 01/02/18 0227  NA 138 138  --  137  K 4.2 3.3* 4.7 4.2  CL 108 108  --  108  CO2 21* 22  --  22  GLUCOSE 95 93  --  86  BUN 12 10  --  8  CREATININE 1.03* 0.88  --  0.77  CALCIUM 9.5  8.9  --  9.1  GFRNONAA 55* >60  --  >60  GFRAA >60 >60  --  >60  ANIONGAP 9 8  --  7     Hematology Recent Labs  Lab 12/31/17 1944  WBC 5.2  RBC 4.24  HGB 12.7  HCT 39.8  MCV 93.9  MCH 30.0  MCHC 31.9  RDW 12.8  PLT 245    Cardiac EnzymesNo results for input(s): TROPONINI in the last 168 hours. No results for input(s): TROPIPOC in the last 168 hours.   BNPNo results for input(s): BNP, PROBNP in the last 168 hours.   DDimer No results for input(s): DDIMER in the last 168 hours.   Radiology    Dg Chest Portable 1 View Result Date: 12/31/2017 CLINICAL DATA:  Atrial fibrillation, dyspnea EXAM: PORTABLE CHEST 1 VIEW COMPARISON:  None. FINDINGS: Normal heart size. Mildly atherosclerotic thoracic aorta. Otherwise normal mediastinal contour. No pneumothorax. No pleural effusion. Lungs appear clear, with no acute consolidative airspace disease and no pulmonary edema. IMPRESSION: No active disease. Electronically Signed   By: Janina Mayo.D.  On: 12/31/2017 20:47    Cardiac Studies   07/08/16: [facility]) Conclusions Summary Normal left ventricular systolic function Mild concentric left ventricular hypertrophy No significant Doppler findings. No significant valvular abnormalities.  Patient Profile     67 y.o. female w/PMHx of HTN (?), HLD, asthma, anxiety, anemia, PAFib, admitted with rapid AFib/flutter loading Tikosyn  Assessment & Plan    1. Syncope (weeks ago)     Suspect 2/2 RAFib vs possible post termination pause     AF >> SR here without pause, sinus rates 70's  2. Rapid AFib     CHA2DS2Vasc is 3, on Eliquis with reported compliance     Started on Tikosyn last PM >>> SR     K+ 4.2     Mag 2.2     Creat 0.77     QTc stable  3. HTN     Looks OK  Anticipate discharge home tomorrow, she Shealeigh Dunstan need 7 day supply of Tikosyn to go home with.  Follow up is in place.    For questions or updates, please contact [place] Please consult [email or web address] for  contact info under        Signed, Baldwin Jamaica, PA-C  01/02/2018, 9:48 AM     I have seen and examined this patient with Tommye Standard.  Agree with above, note added to reflect my findings.  On exam, RRR, no murmurs, lungs clear. remians in sinus rhythm, QTc stable. No changes. Likely discharge tomorrow.    Joyice Magda M. Naiyah Klostermann MD 01/02/2018 9:26 PM

## 2018-01-02 NOTE — Progress Notes (Addendum)
Pt had three episode of tachycardia but quickly went back down ([phone], 122- 1701 and 151- 1729). Continue to monitor pt's HR. Missed beat 1.2 sec. HS [employer]

## 2018-01-02 NOTE — Care Management Note (Addendum)
Case Management Note  Patient Details  Name: Nicole [name] MRN: [id number] Date of Birth: [birth year]  Subjective/Objective:     Pt admitted for Tikosyn load               Action/Plan:  PTA independent from home, pts husband drives trucks.  Family are close by if needed.  Pt has PCP and denied barriers with obtaining /paying for medications as prescribed.  CM informed pt of copays with generic Tikosyn and that brand name required PA.  Benefit check documented in epic via CM note and on physician sticky tab.  CM also text paged attending service to inform of benefit check   Expected Discharge Date:                  Expected Discharge Plan:  Home/Self Care  In-House Referral:     Discharge planning Services  CM Consult  Post Acute Care Choice:    Choice offered to:     DME Arranged:    DME Agency:     HH Arranged:    HH Agency:     Status of Service:     If discussed at [employer], dates discussed:    Additional Comments: 01/02/2018 Attending not in agreement to write Dofetilide prescription today as dose may change.  Attending will write script on day of discharge.  This information was communicated to charge nurse to ensure that once prescription is written it needs to be sent to pharmacy and pt will need to have 7 day inventory prior to discharge.  CM also left CM handoff informing of status  CM contacted pharmacy of choice [facility] [place]  - pharmacy does not have 537mcg nor do they have the capability to order that dose.  Pharmacy can however order 237mcg Tikosyn/ DOFETILIDE and have next day delivery.  Pt will discharge home on Tikoysn/ DOFETILIDE   - [facility] pharmacy will provide 7 day supply at discharge - pt requested to  take prescription to pharmacy on day of discharge.  Maryclare Labrador, RN 01/02/2018, 10:22 AM

## 2018-01-02 NOTE — Progress Notes (Signed)
Pt received 4th dose of tikosyn. QTc after 3hrs was 0.481. HS [employer]

## 2018-01-02 NOTE — Discharge Instructions (Signed)

## 2018-01-02 NOTE — Progress Notes (Signed)
Called by RN, pt with bursts of tachycardia, then slows she also has missed beats.  But 1.2 sec only of a pause.  Pt asymptomatic.  Labs were stable this AM.  Will continue to monitor.

## 2018-01-03 LAB — BASIC METABOLIC PANEL
Anion gap: 9 (ref 5–15)
BUN: 13 mg/dL (ref 8–23)
CHLORIDE: 105 mmol/L (ref 98–111)
CO2: 23 mmol/L (ref 22–32)
Calcium: 9.2 mg/dL (ref 8.9–10.3)
Creatinine, Ser: 0.78 mg/dL (ref 0.44–1.00)
Glucose, Bld: 88 mg/dL (ref 70–99)
POTASSIUM: 3.9 mmol/L (ref 3.5–5.1)
SODIUM: 137 mmol/L (ref 135–145)

## 2018-01-03 LAB — MAGNESIUM: MAGNESIUM: 2.3 mg/dL (ref 1.7–2.4)

## 2018-01-03 MED ORDER — DOFETILIDE 500 MCG PO CAPS: 500.0000 ug | ORAL_CAPSULE | Freq: Two times a day (BID) | ORAL | 3 refills | Status: DC

## 2018-01-03 MED ORDER — DOFETILIDE 500 MCG PO CAPS: 500.0000 ug | ORAL_CAPSULE | Freq: Two times a day (BID) | ORAL | 0 refills | Status: DC

## 2018-01-03 MED ORDER — POTASSIUM CHLORIDE CRYS ER 20 MEQ PO TBCR: 40.0000 meq | EXTENDED_RELEASE_TABLET | Freq: Once | ORAL | Status: DC

## 2018-01-03 NOTE — Progress Notes (Signed)
Progress Note  Patient Name: Nicole [name] Date of Encounter: 01/03/2018  Primary Cardiologist: No primary care provider on file.   Subjective   No chest pain or shortness of breath.  Inpatient Medications    Scheduled Meds: . apixaban  5 mg Oral BID  . dofetilide  500 mcg Oral BID  . ferrous sulfate  325 mg Oral TID  . folic acid  1 mg Oral Daily  . mometasone-formoterol  2 puff Inhalation BID  . pravastatin  40 mg Oral QHS  . sodium chloride flush  3 mL Intravenous Q12H  . zolpidem  5 mg Oral QHS   Continuous Infusions: . sodium chloride    . diltiazem (CARDIZEM) infusion Stopped (01/01/18 0010)   PRN Meds: sodium chloride, butalbital-acetaminophen-caffeine, butorphanol, clonazePAM, sodium chloride flush   Vital Signs    Vitals:   01/03/18 0338 01/03/18 0753 01/03/18 0758 01/03/18 0854  BP: 129/70  134/75   Pulse: 64     Resp: 13 19 16    Temp: 98.1 F (36.7 C)  98.4 F (36.9 C)   TempSrc: Oral  Oral   SpO2: 97%  98% 97%  Weight:      Height:        Intake/Output Summary (Last 24 hours) at 01/03/2018 1057 Last data filed at 01/03/2018 0900 Gross per 24 hour  Intake 880 ml  Output -  Net 880 ml   Filed Weights   12/31/17 2200  Weight: 92.4 kg    Telemetry    Normal sinus rhythm- Personally Reviewed  ECG    Normal sinus rhythm with acceptable QTC- Personally Reviewed  Physical Exam   GEN: No acute distress.   Neck: No JVD Cardiac: RRR, no murmurs, rubs, or gallops.  Respiratory: Clear to auscultation bilaterally. GI: Soft, nontender, non-distended  MS: No edema; No deformity. Neuro:  Nonfocal  Psych: Normal affect   Labs    Chemistry Recent Labs  Lab 01/01/18 0233 01/01/18 1355 01/02/18 0227 01/03/18 0221  NA 138  --  137 137  K 3.3* 4.7 4.2 3.9  CL 108  --  108 105  CO2 22  --  22 23  GLUCOSE 93  --  86 88  BUN 10  --  8 13  CREATININE 0.88  --  0.77 0.78  CALCIUM 8.9  --  9.1 9.2  GFRNONAA >60  --  >60 >60  GFRAA >60   --  >60 >60  ANIONGAP 8  --  7 9     Hematology Recent Labs  Lab 12/31/17 1944  WBC 5.2  RBC 4.24  HGB 12.7  HCT 39.8  MCV 93.9  MCH 30.0  MCHC 31.9  RDW 12.8  PLT 245    Cardiac EnzymesNo results for input(s): TROPONINI in the last 168 hours. No results for input(s): TROPIPOC in the last 168 hours.   BNPNo results for input(s): BNP, PROBNP in the last 168 hours.   DDimer No results for input(s): DDIMER in the last 168 hours.   Radiology    No results found.  Cardiac Studies   None  Patient Profile     67 y.o. female admitted with atrial fibrillation, status post initiation of dofetilide therapy  Assessment & Plan    1.  Persistent atrial fibrillation -she is maintaining sinus rhythm on dofetilide.  She will continue this medication, as her QT interval is acceptable.  She will need follow-up in 1 week with a twelve-lead EKG and a BMP. 2.  Syncope -  she had posttermination pauses.  Hopefully she will maintain sinus rhythm and had no pauses. 3.  Hypertension -her blood pressure is acceptable.  She will continue her current medications.  For questions or updates, please contact [place] Please consult [email or web address] for contact info under Cardiology/STEMI.      Signed, Cristopher Peru, MD  01/03/2018, 10:57 AM  Patient ID: Nicole [name], female   DOB: [birth year], 67 y.o.   MRN: [id number]

## 2018-01-03 NOTE — Discharge Summary (Addendum)
Discharge Summary    Patient ID: Nicole [name] MRN: [id number]; DOB: 01/05/1951  Admit date: 12/31/2017 Discharge date: 01/03/2018  Primary Care Provider: Harvie Junior, MD  Primary Cardiologist/Electrophysiologist:  [name], MD   Discharge Diagnoses    Active Problems:   Atrial flutter with rapid ventricular response (HCC) Hypokalemia Hypertension Syncope  Allergies Allergies  Allergen Reactions  . Meperidine Nausea And Vomiting    States she "About passed out"  . Prednisone Palpitations    Tachycardia     Diagnostic Studies/Procedures    None   History of Present Illness     Nicole [name] a 67 y.o with history of symptomatic atrial fibrillation with rapid ventricular rate and syncope admitted for Tikosyn load.  Patient had a syncope about 2 weeks ago.  Noted tachycardia palpitation and fatigue.  Seen by Dr. Rayann Heman in clinic 9/11 and noted in atrial fibrillation with rapid ventricular rate.  Syncope felt due to posttermination pause.  Initial plan was to direct admit however due to unavailability of bed he was transferred to ER.  Hospital Course     Consultants: None  Patient was admitted and started on IV diltiazem for rate control.  Her Eliquis was continued.  QT appears stable and started on Tikosyn load.  Converted to sinus rhythm without pause.  She was supplemented for hypokalemia.  Maintaining sinus rhythm throughout admission.  Symptoms improved.  Ambulated without any issue.  QTC remained stable.  Patient was seen by Dr. Lovena Le today and deemed stable for discharge.  Tikosyn prescription placed on chart and discussed with case manager.  Follow-up has been arranged.  Discharge Vitals Blood pressure 133/70, pulse 64, temperature 98.5 F (36.9 C), temperature source Oral, resp. rate (!) 21, height 5\' 8"  (1.727 m), weight 92.4 kg, SpO2 95 %.  Filed Weights   12/31/17 2200  Weight: 92.4 kg    Labs & Radiologic Studies    CBC Recent Labs   12/31/17 1944  WBC 5.2  HGB 12.7  HCT 39.8  MCV 93.9  PLT 086   Basic Metabolic Panel Recent Labs    01/02/18 0227 01/03/18 0221  NA 137 137  K 4.2 3.9  CL 108 105  CO2 22 23  GLUCOSE 86 88  BUN 8 13  CREATININE 0.77 0.78  CALCIUM 9.1 9.2  MG 2.2 2.3   _____________  Dg Chest Portable 1 View  Result Date: 12/31/2017 CLINICAL DATA:  Atrial fibrillation, dyspnea EXAM: PORTABLE CHEST 1 VIEW COMPARISON:  None. FINDINGS: Normal heart size. Mildly atherosclerotic thoracic aorta. Otherwise normal mediastinal contour. No pneumothorax. No pleural effusion. Lungs appear clear, with no acute consolidative airspace disease and no pulmonary edema. IMPRESSION: No active disease. Electronically Signed   By: Ilona Sorrel M.D.   On: 12/31/2017 20:47   Disposition   Pt is being discharged home today in good condition.  Follow-up Plans & Appointments    Follow-up Information    [facility] Follow up on 01/09/2018.   Specialty:  Cardiology Why:  11:00AM Contact information: [address] [id number] [place] [phone]       [name], MD Follow up on 02/04/2018.   Specialty:  Cardiology Why:  9:15AM Contact information: [place] [address] [phone]          Discharge Instructions    Diet - low sodium heart healthy   Complete by:  As directed    Increase activity slowly  Complete by:  As directed       Discharge Medications   Allergies as of 01/03/2018      Reactions   Meperidine Nausea And Vomiting   States she "About passed out"   Prednisone Palpitations   Tachycardia       Medication List    TAKE these medications   butorphanol 10 MG/ML nasal spray Commonly known as:  STADOL Place 1 spray into the nose every 4 (four) hours as needed for headache.   clonazePAM 1 MG tablet Commonly known as:  KLONOPIN Take 1 mg by mouth 3 (three) times daily.     diltiazem 120 MG 24 hr capsule Commonly known as:  TIAZAC Take 120 mg by mouth daily.   dofetilide 500 MCG capsule Commonly known as:  TIKOSYN Take 1 capsule (500 mcg total) by mouth 2 (two) times daily.   dofetilide 500 MCG capsule Commonly known as:  TIKOSYN Take 1 capsule (500 mcg total) by mouth 2 (two) times daily. Start taking on:  01/09/2018   ELIQUIS 5 MG Tabs tablet Generic drug:  apixaban Take 5 mg by mouth 2 (two) times daily.   ESGIC 50-325-40 MG tablet Generic drug:  butalbital-acetaminophen-caffeine Take 1 tablet by mouth every 8 (eight) hours as needed for headache.   ferrous sulfate 325 (65 FE) MG tablet Take 325 mg by mouth 3 (three) times daily.   fluticasone-salmeterol 115-21 MCG/ACT inhaler Commonly known as:  ADVAIR HFA Inhale 2 puffs into the lungs 2 (two) times daily.   folic acid 1 MG tablet Commonly known as:  FOLVITE Take 1 mg by mouth daily.   pravastatin 40 MG tablet Commonly known as:  PRAVACHOL Take 40 mg by mouth at bedtime.   promethazine 25 MG tablet Commonly known as:  PHENERGAN Take 25 mg by mouth every 6 (six) hours as needed for nausea or vomiting.   zolpidem 5 MG tablet Commonly known as:  AMBIEN Take 5 mg by mouth at bedtime.        Acute coronary syndrome (MI, NSTEMI, STEMI, etc) this admission?: No.    Outstanding Labs/Studies   BMET and EKG during follow-up in A. fib clinic.  Duration of Discharge Encounter   Greater than 30 minutes including physician time.  Mahalia Longest [place], PA 01/03/2018, 12:19 PM  EP Attending  Patient seen and examined. Agree with above. She is stable for DC and will followup as noted above.   Mikle Bosworth.D.

## 2018-01-03 NOTE — Progress Notes (Signed)
NCM sent tikosyn script to main pharmacy to be filled for the 7 days.  They will call Drue Dun RN , who is taking care of patient's discharge when it has been filled so Drue Dun can pick it up from the main pharmacy.

## 2018-01-09 ENCOUNTER — Ambulatory Visit (HOSPITAL_COMMUNITY): Payer: Medicare Other | Admitting: Nurse Practitioner

## 2018-01-12 DIAGNOSIS — I4891 Unspecified atrial fibrillation: Secondary | ICD-10-CM | POA: Diagnosis not present

## 2018-01-12 DIAGNOSIS — Z7901 Long term (current) use of anticoagulants: Secondary | ICD-10-CM | POA: Diagnosis not present

## 2018-01-12 DIAGNOSIS — D509 Iron deficiency anemia, unspecified: Secondary | ICD-10-CM | POA: Diagnosis not present

## 2018-01-13 ENCOUNTER — Ambulatory Visit (HOSPITAL_COMMUNITY)
Admission: RE | Admit: 2018-01-13 | Discharge: 2018-01-13 | Disposition: A | Discharge status: Home / Self Care | Payer: Medicare Other | Source: Ambulatory Visit | Attending: Nurse Practitioner | Admitting: Nurse Practitioner

## 2018-01-13 ENCOUNTER — Encounter (HOSPITAL_COMMUNITY): Payer: Self-pay | Admitting: Nurse Practitioner

## 2018-01-13 ENCOUNTER — Other Ambulatory Visit (HOSPITAL_COMMUNITY): Payer: Self-pay | Admitting: *Deleted

## 2018-01-13 VITALS — BP 152/98 | HR 76 | Ht 68.0 in | Wt 205.0 lb

## 2018-01-13 DIAGNOSIS — Z888 Allergy status to other drugs, medicaments and biological substances status: Secondary | ICD-10-CM | POA: Insufficient documentation

## 2018-01-13 DIAGNOSIS — Z8249 Family history of ischemic heart disease and other diseases of the circulatory system: Secondary | ICD-10-CM | POA: Insufficient documentation

## 2018-01-13 DIAGNOSIS — E785 Hyperlipidemia, unspecified: Secondary | ICD-10-CM | POA: Insufficient documentation

## 2018-01-13 DIAGNOSIS — K219 Gastro-esophageal reflux disease without esophagitis: Secondary | ICD-10-CM | POA: Insufficient documentation

## 2018-01-13 DIAGNOSIS — Z7901 Long term (current) use of anticoagulants: Secondary | ICD-10-CM | POA: Diagnosis not present

## 2018-01-13 DIAGNOSIS — I480 Paroxysmal atrial fibrillation: Secondary | ICD-10-CM | POA: Insufficient documentation

## 2018-01-13 DIAGNOSIS — Z79899 Other long term (current) drug therapy: Secondary | ICD-10-CM | POA: Diagnosis not present

## 2018-01-13 DIAGNOSIS — M1990 Unspecified osteoarthritis, unspecified site: Secondary | ICD-10-CM | POA: Diagnosis not present

## 2018-01-13 DIAGNOSIS — I10 Essential (primary) hypertension: Secondary | ICD-10-CM | POA: Insufficient documentation

## 2018-01-13 DIAGNOSIS — Z90710 Acquired absence of both cervix and uterus: Secondary | ICD-10-CM | POA: Insufficient documentation

## 2018-01-13 DIAGNOSIS — R55 Syncope and collapse: Secondary | ICD-10-CM | POA: Diagnosis not present

## 2018-01-13 DIAGNOSIS — F419 Anxiety disorder, unspecified: Secondary | ICD-10-CM | POA: Diagnosis not present

## 2018-01-13 DIAGNOSIS — Z885 Allergy status to narcotic agent status: Secondary | ICD-10-CM | POA: Diagnosis not present

## 2018-01-13 DIAGNOSIS — Z9889 Other specified postprocedural states: Secondary | ICD-10-CM | POA: Insufficient documentation

## 2018-01-13 DIAGNOSIS — J45909 Unspecified asthma, uncomplicated: Secondary | ICD-10-CM | POA: Diagnosis not present

## 2018-01-13 DIAGNOSIS — D649 Anemia, unspecified: Secondary | ICD-10-CM | POA: Diagnosis not present

## 2018-01-13 LAB — BASIC METABOLIC PANEL
ANION GAP: 10 (ref 5–15)
BUN: 9 mg/dL (ref 8–23)
CHLORIDE: 105 mmol/L (ref 98–111)
CO2: 26 mmol/L (ref 22–32)
Calcium: 9.4 mg/dL (ref 8.9–10.3)
Creatinine, Ser: 0.81 mg/dL (ref 0.44–1.00)
GFR calc non Af Amer: >60 mL/min (ref >60)
Glucose, Bld: 101 mg/dL — ABNORMAL HIGH (ref 70–99)
Potassium: 3.6 mmol/L (ref 3.5–5.1)
Sodium: 141 mmol/L (ref 135–145)

## 2018-01-13 LAB — MAGNESIUM: Magnesium: 2.2 mg/dL (ref 1.7–2.4)

## 2018-01-13 MED ORDER — POTASSIUM CHLORIDE CRYS ER 20 MEQ PO TBCR: 20.0000 meq | EXTENDED_RELEASE_TABLET | Freq: Every day | ORAL | 3 refills | Status: DC

## 2018-01-13 MED ORDER — AMLODIPINE BESYLATE 5 MG PO TABS: 5.0000 mg | ORAL_TABLET | Freq: Every day | ORAL | 3 refills | Status: DC

## 2018-01-13 NOTE — Progress Notes (Signed)
Primary Care Physician: Harvie Junior, MD Referring Physician: Dr. Jeananne Rama Laprise is a 67 y.o. female with a h/o anemia,  afib, and syncope possibly 2/2 termination pause  that presented to his office with afib with RVR at 160 bpm and was admitted for Tikosyn load. Converted to SR without pause. Qtc remained stable. She feels improved, no further issues with afib since tikosyn load.   Today, she denies symptoms of palpitations, chest pain, shortness of breath, orthopnea, PND, lower extremity edema, dizziness, presyncope, syncope, or neurologic sequela. The patient is tolerating medications without difficulties and is otherwise without complaint today.   Past Medical History:  Diagnosis Date  . Anticoagulant long-term use 11/16/2014  . Anxiety 10/06/2017  . Arthritis 10/06/2017   knees bilateral  . Asthma 11/16/2014  . Esophageal reflux 11/16/2014  . Essential hypertension 08/15/2015   pt denies  . H/O: GI bleed 12/11/2016  . Hyperlipidemia 11/17/2014  . Migraine headache 11/16/2014  . Nausea 11/16/2014  . Pain in limb 11/16/2014  . Panic disorder 11/16/2014  . Paroxysmal atrial fibrillation ([place]) 11/17/2014  . Swelling of joint 11/16/2014  . Symptomatic anemia 11/24/2016  . Tendonitis 11/16/2014  . UTI (urinary tract infection) 12/19/2015   Past Surgical History:  Procedure Laterality Date  . ABDOMINAL HYSTERECTOMY    . CARDIOVERSION    . CESAREAN SECTION    . COLONOSCOPY    . ROTATOR CUFF REPAIR Left     Current Outpatient Medications  Medication Sig Dispense Refill  . butalbital-acetaminophen-caffeine (ESGIC) 50-325-40 MG tablet Take 1 tablet by mouth every 8 (eight) hours as needed for headache.    . butorphanol (STADOL) 10 MG/ML nasal spray Place 1 spray into the nose every 4 (four) hours as needed for headache.    . clonazePAM (KLONOPIN) 1 MG tablet Take 1 mg by mouth 3 (three) times daily.     Marland Kitchen dofetilide (TIKOSYN) 500 MCG capsule Take 1 capsule (500 mcg total) by  mouth 2 (two) times daily. 14 capsule 0  . ELIQUIS 5 MG TABS tablet Take 5 mg by mouth 2 (two) times daily.  6  . ferrous sulfate 325 (65 FE) MG tablet Take 325 mg by mouth 3 (three) times daily.  5  . fluticasone-salmeterol (ADVAIR HFA) 115-21 MCG/ACT inhaler Inhale 2 puffs into the lungs 2 (two) times daily.    . folic acid (FOLVITE) 1 MG tablet Take 1 mg by mouth daily.  6  . pravastatin (PRAVACHOL) 40 MG tablet Take 40 mg by mouth at bedtime.    . promethazine (PHENERGAN) 25 MG tablet Take 25 mg by mouth every 6 (six) hours as needed for nausea or vomiting.    Marland Kitchen zolpidem (AMBIEN) 5 MG tablet Take 5 mg by mouth at bedtime.    Marland Kitchen amLODipine (NORVASC) 5 MG tablet Take 1 tablet (5 mg total) by mouth daily. 30 tablet 3   No current facility-administered medications for this encounter.     Allergies  Allergen Reactions  . Meperidine Nausea And Vomiting    States she "About passed out"  . Prednisone Palpitations    Tachycardia     Social History   Socioeconomic History  . Marital status: Married    Spouse name: Not on file  . Number of children: Not on file  . Years of education: Not on file  . Highest education level: Not on file  Occupational History  . Not on file  Social Needs  . Financial resource strain: Not  on file  . Food insecurity:    Worry: Not on file    Inability: Not on file  . Transportation needs:    Medical: Not on file    Non-medical: Not on file  Tobacco Use  . Smoking status: Never Smoker  . Smokeless tobacco: Never Used  Substance and Sexual Activity  . Alcohol use: Never    Frequency: Never  . Drug use: Never  . Sexual activity: Not on file  Lifestyle  . Physical activity:    Days per week: Not on file    Minutes per session: Not on file  . Stress: Not on file  Relationships  . Social connections:    Talks on phone: Not on file    Gets together: Not on file    Attends religious service: Not on file    Active member of club or organization:  Not on file    Attends meetings of clubs or organizations: Not on file    Relationship status: Not on file  . Intimate partner violence:    Fear of current or ex partner: Not on file    Emotionally abused: Not on file    Physically abused: Not on file    Forced sexual activity: Not on file  Other Topics Concern  . Not on file  Social History Narrative   Lives in [place] with spouse   Retired from school system.    Family History  Problem Relation Age of Onset  . Heart attack Mother   . Heart disease Mother   . Heart disease Sister   . Heart disease Sister   . Heart disease Sister   . Heart disease Sister   . Heart disease Sister     ROS- All systems are reviewed and negative except as per the HPI above  Physical Exam: Vitals:   01/13/18 0913  BP: (!) 152/98  Pulse: 76  Weight: 93 kg  Height: 5\' 8"  (1.727 m)   Wt Readings from Last 3 Encounters:  01/13/18 93 kg  12/31/17 92.4 kg  12/31/17 92.2 kg    Labs: Lab Results  Component Value Date   NA 141 01/13/2018   K 3.6 01/13/2018   CL 105 01/13/2018   CO2 26 01/13/2018   GLUCOSE 101 (H) 01/13/2018   BUN 9 01/13/2018   CREATININE 0.81 01/13/2018   CALCIUM 9.4 01/13/2018   MG 2.2 01/13/2018   No results found for: INR No results found for: CHOL, HDL, LDLCALC, TRIG   GEN- The patient is well appearing, alert and oriented x 3 today.   Head- normocephalic, atraumatic Eyes-  Sclera clear, conjunctiva pink Ears- hearing intact Oropharynx- clear Neck- supple, no JVP Lymph- no cervical lymphadenopathy Lungs- Clear to ausculation bilaterally, normal work of breathing Heart- Regular rate and rhythm, no murmurs, rubs or gallops, PMI not laterally displaced GI- soft, NT, ND, + BS Extremities- no clubbing, cyanosis, or edema MS- no significant deformity or atrophy Skin- no rash or lesion Psych- euthymic mood, full affect Neuro- strength and sensation are intact  EKG- NSR at 76 ms, pr int 186 ms,  qrs int 84  ms, qtc 477 ms Epic records reduced    Assessment and Plan: 1. Afib  Recently loaded on tikosyn and is staying in SR Continue 500 mcgs bid  Tikosyn precautions discussed  2. Syncope Possibly from termination pause No further issues  3. Chadsvasc score of at least 2 Continue eliquis 5 mg bid   4. Anemia Improving Pt  states last hemoglobin 12.1 as checked yesterday   Butch Penny C. Gurvir Schrom, [facility] [address] [place], [place] [address] [phone]

## 2018-01-13 NOTE — Patient Instructions (Signed)
Start Amlodipine 1/2 tablet a day -- call with blood pressure readings next week. [phone]

## 2018-02-04 ENCOUNTER — Other Ambulatory Visit: Payer: Self-pay

## 2018-02-04 ENCOUNTER — Ambulatory Visit (HOSPITAL_COMMUNITY)
Admission: AD | Admit: 2018-02-04 | Discharge: 2018-02-04 | Disposition: A | Discharge status: Home / Self Care | Payer: Medicare Other | Source: Ambulatory Visit | Attending: Internal Medicine | Admitting: Internal Medicine

## 2018-02-04 ENCOUNTER — Ambulatory Visit (INDEPENDENT_AMBULATORY_CARE_PROVIDER_SITE_OTHER): Payer: Medicare Other | Admitting: Internal Medicine

## 2018-02-04 ENCOUNTER — Encounter (HOSPITAL_COMMUNITY)
Admission: AD | Disposition: A | Discharge status: Home / Self Care | Payer: Self-pay | Source: Ambulatory Visit | Attending: Internal Medicine

## 2018-02-04 ENCOUNTER — Encounter: Payer: Self-pay | Admitting: Internal Medicine

## 2018-02-04 VITALS — BP 140/88 | HR 74 | Ht 68.0 in | Wt 197.8 lb

## 2018-02-04 DIAGNOSIS — Z9889 Other specified postprocedural states: Secondary | ICD-10-CM | POA: Insufficient documentation

## 2018-02-04 DIAGNOSIS — I480 Paroxysmal atrial fibrillation: Secondary | ICD-10-CM | POA: Insufficient documentation

## 2018-02-04 DIAGNOSIS — Z8679 Personal history of other diseases of the circulatory system: Secondary | ICD-10-CM | POA: Diagnosis not present

## 2018-02-04 DIAGNOSIS — Z87440 Personal history of urinary (tract) infections: Secondary | ICD-10-CM | POA: Insufficient documentation

## 2018-02-04 DIAGNOSIS — K219 Gastro-esophageal reflux disease without esophagitis: Secondary | ICD-10-CM | POA: Diagnosis not present

## 2018-02-04 DIAGNOSIS — F419 Anxiety disorder, unspecified: Secondary | ICD-10-CM | POA: Insufficient documentation

## 2018-02-04 DIAGNOSIS — R55 Syncope and collapse: Secondary | ICD-10-CM

## 2018-02-04 DIAGNOSIS — Z7951 Long term (current) use of inhaled steroids: Secondary | ICD-10-CM | POA: Diagnosis not present

## 2018-02-04 DIAGNOSIS — J45909 Unspecified asthma, uncomplicated: Secondary | ICD-10-CM | POA: Insufficient documentation

## 2018-02-04 DIAGNOSIS — Z7901 Long term (current) use of anticoagulants: Secondary | ICD-10-CM | POA: Insufficient documentation

## 2018-02-04 DIAGNOSIS — E785 Hyperlipidemia, unspecified: Secondary | ICD-10-CM | POA: Diagnosis not present

## 2018-02-04 DIAGNOSIS — D649 Anemia, unspecified: Secondary | ICD-10-CM | POA: Diagnosis not present

## 2018-02-04 DIAGNOSIS — M1990 Unspecified osteoarthritis, unspecified site: Secondary | ICD-10-CM | POA: Insufficient documentation

## 2018-02-04 DIAGNOSIS — Z90710 Acquired absence of both cervix and uterus: Secondary | ICD-10-CM | POA: Insufficient documentation

## 2018-02-04 DIAGNOSIS — I10 Essential (primary) hypertension: Secondary | ICD-10-CM | POA: Diagnosis not present

## 2018-02-04 DIAGNOSIS — R002 Palpitations: Secondary | ICD-10-CM | POA: Diagnosis not present

## 2018-02-04 DIAGNOSIS — Z79899 Other long term (current) drug therapy: Secondary | ICD-10-CM | POA: Insufficient documentation

## 2018-02-04 HISTORY — PX: LOOP RECORDER INSERTION: EP1214

## 2018-02-04 SURGERY — LOOP RECORDER INSERTION

## 2018-02-04 MED ORDER — LIDOCAINE-EPINEPHRINE 1 %-1:100000 IJ SOLN
INTRAMUSCULAR | Status: AC
  Filled 2018-02-04: qty 1

## 2018-02-04 MED ORDER — LIDOCAINE-EPINEPHRINE 1 %-1:100000 IJ SOLN
INTRAMUSCULAR | Status: DC | PRN
  Administered 2018-02-04: 15 mL

## 2018-02-04 SURGICAL SUPPLY — 2 items
LOOP REVEAL LINQSYS (Prosthesis & Implant Heart) ×2 IMPLANT
PACK LOOP INSERTION (CUSTOM PROCEDURE TRAY) ×2 IMPLANT

## 2018-02-04 NOTE — Patient Instructions (Addendum)
Medication Instructions:  Your physician recommends that you continue on your current medications as directed. Please refer to the Current Medication list given to you today.  Labwork: None ordered.  Testing/Procedures: None ordered.  Follow-Up:  You will follow up with device clinic in 7-10 days for a wound check.  Your physician wants you to follow-up in: 3 months with Nicole [name] at the afib clinic.  Any Other Special Instructions Will Be Listed Below (If Applicable).  If you need a refill on your cardiac medications before your next appointment, please call your pharmacy.    Implantable Loop Recorder Placement, Care After Refer to this sheet in the next few weeks. These instructions provide you with information about caring for yourself after your procedure. Your health care provider may also give you more specific instructions. Your treatment has been planned according to current medical practices, but problems sometimes occur. Call your health care provider if you have any problems or questions after your procedure. What can I expect after the procedure? After the procedure, it is common to have:  Soreness or pain near the cut from surgery (incision).  Some swelling or bruising near the incision.  Follow these instructions at home: Medicines  Take over-the-counter and prescription medicines only as told by your health care provider.  If you were prescribed an antibiotic medicine, take it as told by your health care provider. Do not stop taking the antibiotic even if you start to feel better. Bathing  Do not take baths, swim, or use a hot tub until your health care provider approves. Ask your health care provider if you can take showers. You may only be allowed to take sponge baths for bathing. Incision care  Follow instructions from your health care provider about how to take care of your incision. Make sure you: ? Wash your hands with soap and water before you  change your bandage (dressing). If soap and water are not available, use hand sanitizer. ? Change your dressing as told by your health care provider. ? Keep your dressing dry. ? Leave stitches (sutures), skin glue, or adhesive strips in place. These skin closures may need to stay in place for 2 weeks or longer. If adhesive strip edges start to loosen and curl up, you may trim the loose edges. Do not remove adhesive strips completely unless your health care provider tells you to do that.  Check your incision area every day for signs of infection. Check for: ? More redness, swelling, or pain. ? Fluid or blood. ? Warmth. ? Pus or a bad smell. Driving  If you received a sedative, do not drive for 24 hours after the procedure.  If you did not receive a sedative, ask your health care provider when it is safe to drive. Activity  Return to your normal activities as told by your health care provider. Ask your health care provider what activities are safe for you.  Until your health care provider says it is safe: ? Do not lift anything that is heavier than 10 lb (4.5 kg). ? Do not do activities that involve lifting your arms over your head. General instructions   Follow instructions from your health care provider about how and when to use your implantable loop recorder.  Do not go through a metal detection gate, and do not let someone hold a metal detector over your chest. Show your ID card.  Do not have an MRI unless you check with your health care provider first.  Do not use any tobacco products, such as cigarettes, chewing tobacco, and e-cigarettes. Tobacco can delay healing. If you need help quitting, ask your health care provider.  Keep all follow-up visits as told by your health care provider. This is important. Contact a health care provider if:  You have more redness, swelling, or pain around your incision.  You have more fluid or blood coming from your incision.  Your incision  feels warm to the touch.  You have pus or a bad smell coming from your incision.  You have a fever.  You have pain that is not relieved by your pain medicine.  You have triggered your device because of fainting (syncope) or because of a heartbeat that feels like it is racing, slow, fluttering, or skipping (palpitations). Get help right away if:  You have chest pain.  You have difficulty breathing. This information is not intended to replace advice given to you by your health care provider. Make sure you discuss any questions you have with your health care provider. Document Released: 03/20/2015 Document Revised: 09/14/2015 Document Reviewed: 01/11/2015 Elsevier Interactive Patient Education  [employer].

## 2018-02-04 NOTE — H&P (View-Only) (Signed)
PCP: Harvie Junior, MD   Primary EP: Dr Ola Spurr at [place] is a 67 y.o. female who presents today for routine electrophysiology followup.  Since last being seen in our clinic, the patient reports doing very well.  She reports feeling "much better" with tikosyn.  Energy and SOB are much improved.  She has had no further syncope.  She does have occasional palpitations which are short but concerning to her. Today, she denies symptoms of chest pain, shortness of breath,  lower extremity edema, or dizziness.  The patient is otherwise without complaint today.   Past Medical History:  Diagnosis Date  . Anticoagulant long-term use 11/16/2014  . Anxiety 10/06/2017  . Arthritis 10/06/2017   knees bilateral  . Asthma 11/16/2014  . Esophageal reflux 11/16/2014  . Essential hypertension 08/15/2015   pt denies  . H/O: GI bleed 12/11/2016  . Hyperlipidemia 11/17/2014  . Migraine headache 11/16/2014  . Nausea 11/16/2014  . Pain in limb 11/16/2014  . Panic disorder 11/16/2014  . Paroxysmal atrial fibrillation ([place]) 11/17/2014  . Swelling of joint 11/16/2014  . Symptomatic anemia 11/24/2016  . Tendonitis 11/16/2014  . UTI (urinary tract infection) 12/19/2015   Past Surgical History:  Procedure Laterality Date  . ABDOMINAL HYSTERECTOMY    . CARDIOVERSION    . CESAREAN SECTION    . COLONOSCOPY    . ROTATOR CUFF REPAIR Left     ROS- all systems are reviewed and negatives except as per HPI above  Current Outpatient Medications  Medication Sig Dispense Refill  . amLODipine (NORVASC) 5 MG tablet Take 1 tablet (5 mg total) by mouth daily. 30 tablet 3  . butalbital-acetaminophen-caffeine (ESGIC) 50-325-40 MG tablet Take 1 tablet by mouth every 8 (eight) hours as needed for headache.    . butorphanol (STADOL) 10 MG/ML nasal spray Place 1 spray into the nose every 4 (four) hours as needed for headache.    . clonazePAM (KLONOPIN) 1 MG tablet Take 1 mg by mouth 3 (three) times  daily.     Marland Kitchen dofetilide (TIKOSYN) 500 MCG capsule Take 1 capsule (500 mcg total) by mouth 2 (two) times daily. 14 capsule 0  . ELIQUIS 5 MG TABS tablet Take 5 mg by mouth 2 (two) times daily.  6  . ferrous sulfate 325 (65 FE) MG tablet Take 325 mg by mouth 3 (three) times daily.  5  . fluticasone-salmeterol (ADVAIR HFA) 115-21 MCG/ACT inhaler Inhale 2 puffs into the lungs 2 (two) times daily.    . folic acid (FOLVITE) 1 MG tablet Take 1 mg by mouth daily.  6  . potassium chloride SA (K-DUR,KLOR-CON) 20 MEQ tablet Take 1 tablet (20 mEq total) by mouth daily. 90 tablet 3  . pravastatin (PRAVACHOL) 40 MG tablet Take 40 mg by mouth at bedtime.    . promethazine (PHENERGAN) 25 MG tablet Take 25 mg by mouth every 6 (six) hours as needed for nausea or vomiting.    Marland Kitchen zolpidem (AMBIEN) 5 MG tablet Take 5 mg by mouth at bedtime.     No current facility-administered medications for this visit.     Physical Exam: Vitals:   02/04/18 0911  BP: 140/88  Pulse: 74  SpO2: 98%  Weight: 197 lb 12.8 oz (89.7 kg)  Height: 5\' 8"  (1.727 m)    GEN- The patient is well appearing, alert and oriented x 3 today.   Head- normocephalic, atraumatic Eyes-  Sclera clear, conjunctiva pink Ears- hearing intact Oropharynx-  clear Lungs- Clear to ausculation bilaterally, normal work of breathing Heart- Regular rate and rhythm, no murmurs, rubs or gallops, PMI not laterally displaced GI- soft, NT, ND, + BS Extremities- no clubbing, cyanosis, or edema  Wt Readings from Last 3 Encounters:  02/04/18 197 lb 12.8 oz (89.7 kg)  01/13/18 205 lb (93 kg)  12/31/17 203 lb 11.3 oz (92.4 kg)    EKG tracing ordered today is personally reviewed and shows sinus rhythm 74 bpm, PR 184 msec, QRS 92 msec, QTc 468 msec  Assessment and Plan:  1. afib Improved with tikosyn Qt is stable Labs 01/13/18 reviewed today Continue eliquis for chads2vasc score of 3. I have advised ILR for further evaluation of her palpitations and for  afib management in setting of recent syncope.  2. Anemia Defer to hematology  3. Syncope Resolved Likely due to afib with RVR vs post termination pauses I have advised ILR to further evaluate for arrhythmias which would predispose to syncope.  She understands risks and wishes to proceed.  We will schedule for later today.  No driving when having afib (pt aware)  Follow-up in AF clinic in 3 months  [name] MD, [facility] 02/04/2018 9:35 AM

## 2018-02-04 NOTE — Progress Notes (Signed)
PCP: Harvie Junior, MD   Primary EP: Dr Ola Spurr at [place] is a 67 y.o. female who presents today for routine electrophysiology followup.  Since last being seen in our clinic, the patient reports doing very well.  She reports feeling "much better" with tikosyn.  Energy and SOB are much improved.  She has had no further syncope.  She does have occasional palpitations which are short but concerning to her. Today, she denies symptoms of chest pain, shortness of breath,  lower extremity edema, or dizziness.  The patient is otherwise without complaint today.   Past Medical History:  Diagnosis Date  . Anticoagulant long-term use 11/16/2014  . Anxiety 10/06/2017  . Arthritis 10/06/2017   knees bilateral  . Asthma 11/16/2014  . Esophageal reflux 11/16/2014  . Essential hypertension 08/15/2015   pt denies  . H/O: GI bleed 12/11/2016  . Hyperlipidemia 11/17/2014  . Migraine headache 11/16/2014  . Nausea 11/16/2014  . Pain in limb 11/16/2014  . Panic disorder 11/16/2014  . Paroxysmal atrial fibrillation ([place]) 11/17/2014  . Swelling of joint 11/16/2014  . Symptomatic anemia 11/24/2016  . Tendonitis 11/16/2014  . UTI (urinary tract infection) 12/19/2015   Past Surgical History:  Procedure Laterality Date  . ABDOMINAL HYSTERECTOMY    . CARDIOVERSION    . CESAREAN SECTION    . COLONOSCOPY    . ROTATOR CUFF REPAIR Left     ROS- all systems are reviewed and negatives except as per HPI above  Current Outpatient Medications  Medication Sig Dispense Refill  . amLODipine (NORVASC) 5 MG tablet Take 1 tablet (5 mg total) by mouth daily. 30 tablet 3  . butalbital-acetaminophen-caffeine (ESGIC) 50-325-40 MG tablet Take 1 tablet by mouth every 8 (eight) hours as needed for headache.    . butorphanol (STADOL) 10 MG/ML nasal spray Place 1 spray into the nose every 4 (four) hours as needed for headache.    . clonazePAM (KLONOPIN) 1 MG tablet Take 1 mg by mouth 3 (three) times  daily.     Marland Kitchen dofetilide (TIKOSYN) 500 MCG capsule Take 1 capsule (500 mcg total) by mouth 2 (two) times daily. 14 capsule 0  . ELIQUIS 5 MG TABS tablet Take 5 mg by mouth 2 (two) times daily.  6  . ferrous sulfate 325 (65 FE) MG tablet Take 325 mg by mouth 3 (three) times daily.  5  . fluticasone-salmeterol (ADVAIR HFA) 115-21 MCG/ACT inhaler Inhale 2 puffs into the lungs 2 (two) times daily.    . folic acid (FOLVITE) 1 MG tablet Take 1 mg by mouth daily.  6  . potassium chloride SA (K-DUR,KLOR-CON) 20 MEQ tablet Take 1 tablet (20 mEq total) by mouth daily. 90 tablet 3  . pravastatin (PRAVACHOL) 40 MG tablet Take 40 mg by mouth at bedtime.    . promethazine (PHENERGAN) 25 MG tablet Take 25 mg by mouth every 6 (six) hours as needed for nausea or vomiting.    Marland Kitchen zolpidem (AMBIEN) 5 MG tablet Take 5 mg by mouth at bedtime.     No current facility-administered medications for this visit.     Physical Exam: Vitals:   02/04/18 0911  BP: 140/88  Pulse: 74  SpO2: 98%  Weight: 197 lb 12.8 oz (89.7 kg)  Height: 5\' 8"  (1.727 m)    GEN- The patient is well appearing, alert and oriented x 3 today.   Head- normocephalic, atraumatic Eyes-  Sclera clear, conjunctiva pink Ears- hearing intact Oropharynx-  clear Lungs- Clear to ausculation bilaterally, normal work of breathing Heart- Regular rate and rhythm, no murmurs, rubs or gallops, PMI not laterally displaced GI- soft, NT, ND, + BS Extremities- no clubbing, cyanosis, or edema  Wt Readings from Last 3 Encounters:  02/04/18 197 lb 12.8 oz (89.7 kg)  01/13/18 205 lb (93 kg)  12/31/17 203 lb 11.3 oz (92.4 kg)    EKG tracing ordered today is personally reviewed and shows sinus rhythm 74 bpm, PR 184 msec, QRS 92 msec, QTc 468 msec  Assessment and Plan:  1. afib Improved with tikosyn Qt is stable Labs 01/13/18 reviewed today Continue eliquis for chads2vasc score of 3. I have advised ILR for further evaluation of her palpitations and for  afib management in setting of recent syncope.  2. Anemia Defer to hematology  3. Syncope Resolved Likely due to afib with RVR vs post termination pauses I have advised ILR to further evaluate for arrhythmias which would predispose to syncope.  She understands risks and wishes to proceed.  We will schedule for later today.  No driving when having afib (pt aware)  Follow-up in AF clinic in 3 months  [name] MD, [facility] 02/04/2018 9:35 AM

## 2018-02-04 NOTE — Interval H&P Note (Signed)
History and Physical Interval Note:  02/04/2018 12:49 PM  Nicole [name]  has presented today for surgery, with the diagnosis of stroke  The various methods of treatment have been discussed with the patient and family. After consideration of risks, benefits and other options for treatment, the patient has consented to  Procedure(s): LOOP RECORDER INSERTION (N/A) as a surgical intervention .  The patient's history has been reviewed, patient examined, no change in status, stable for surgery.  I have reviewed the patient's chart and labs.  Questions were answered to the patient's satisfaction.     [name]

## 2018-02-11 ENCOUNTER — Ambulatory Visit: Payer: Medicare Other | Admitting: Internal Medicine

## 2018-02-12 ENCOUNTER — Ambulatory Visit (INDEPENDENT_AMBULATORY_CARE_PROVIDER_SITE_OTHER): Payer: Medicare Other | Admitting: *Deleted

## 2018-02-12 DIAGNOSIS — I4892 Unspecified atrial flutter: Secondary | ICD-10-CM

## 2018-02-12 LAB — CUP PACEART INCLINIC DEVICE CHECK
Date Time Interrogation Session: 20191024093915
MDC IDC PG IMPLANT DT: 20191016

## 2018-02-12 NOTE — Progress Notes (Signed)
Wound Loop check in clinic. Steri-strips removed. Wound without redness or edema. Incision edges approximated, wound well healed. Battery status:  Good. R-waves  0.49 mV. 1 symptom episode pt stated that she just got upset and felt her heart racing ECG ST w/ PVCs, pt denied any dizziness. 0 tachy episodes, 0 pause episodes, 0 brady episodes. 0 AF episodes. Monthly summary reports and ROV with AF clinic 05/07/2018

## 2018-03-09 ENCOUNTER — Ambulatory Visit (INDEPENDENT_AMBULATORY_CARE_PROVIDER_SITE_OTHER): Payer: Medicare Other

## 2018-03-09 DIAGNOSIS — I480 Paroxysmal atrial fibrillation: Secondary | ICD-10-CM | POA: Diagnosis not present

## 2018-03-11 ENCOUNTER — Telehealth (HOSPITAL_COMMUNITY): Payer: Self-pay | Admitting: *Deleted

## 2018-03-11 NOTE — Progress Notes (Signed)
Carelink Summary Report / Loop Recorder 

## 2018-03-11 NOTE — Telephone Encounter (Signed)
Patient called in stating she for the last hour has felt more short of breath. She denies sick contacts, fever, weight gain, swelling or intake of a salty meal. Her BP is 160/93 HR 92. She sent a transmission which donna carroll np reviewed showing NSR. Per Roderic Palau NP - if shortness of breath worsens to present to ER for further work-up. Pt verbalized understanding.

## 2018-04-07 ENCOUNTER — Other Ambulatory Visit (HOSPITAL_COMMUNITY): Payer: Self-pay | Admitting: Nurse Practitioner

## 2018-04-07 NOTE — Telephone Encounter (Signed)
This is a A-Fib clinic pt 

## 2018-04-13 ENCOUNTER — Ambulatory Visit (INDEPENDENT_AMBULATORY_CARE_PROVIDER_SITE_OTHER): Payer: Medicare Other

## 2018-04-13 DIAGNOSIS — I480 Paroxysmal atrial fibrillation: Secondary | ICD-10-CM

## 2018-04-13 LAB — CUP PACEART REMOTE DEVICE CHECK
Date Time Interrogation Session: 20191221163956
MDC IDC PG IMPLANT DT: 20191016

## 2018-04-13 NOTE — Progress Notes (Signed)
Carelink Summary Report / Loop Recorder 

## 2018-04-26 LAB — CUP PACEART REMOTE DEVICE CHECK
Date Time Interrogation Session: 20191118163955
Implantable Pulse Generator Implant Date: 20191016

## 2018-05-07 ENCOUNTER — Encounter (HOSPITAL_COMMUNITY): Payer: Self-pay | Admitting: Nurse Practitioner

## 2018-05-07 ENCOUNTER — Ambulatory Visit (HOSPITAL_COMMUNITY)
Admission: RE | Admit: 2018-05-07 | Discharge: 2018-05-07 | Disposition: A | Discharge status: Home / Self Care | Payer: Medicare Other | Source: Ambulatory Visit | Attending: Nurse Practitioner | Admitting: Nurse Practitioner

## 2018-05-07 VITALS — BP 126/82 | HR 86 | Ht 68.0 in | Wt 195.0 lb

## 2018-05-07 DIAGNOSIS — Z8249 Family history of ischemic heart disease and other diseases of the circulatory system: Secondary | ICD-10-CM | POA: Diagnosis not present

## 2018-05-07 DIAGNOSIS — I480 Paroxysmal atrial fibrillation: Secondary | ICD-10-CM

## 2018-05-07 DIAGNOSIS — Z7901 Long term (current) use of anticoagulants: Secondary | ICD-10-CM | POA: Diagnosis not present

## 2018-05-07 DIAGNOSIS — E785 Hyperlipidemia, unspecified: Secondary | ICD-10-CM | POA: Insufficient documentation

## 2018-05-07 DIAGNOSIS — Z79899 Other long term (current) drug therapy: Secondary | ICD-10-CM | POA: Insufficient documentation

## 2018-05-07 DIAGNOSIS — F419 Anxiety disorder, unspecified: Secondary | ICD-10-CM | POA: Diagnosis not present

## 2018-05-07 DIAGNOSIS — I10 Essential (primary) hypertension: Secondary | ICD-10-CM | POA: Insufficient documentation

## 2018-05-07 LAB — BASIC METABOLIC PANEL
Anion gap: 10 (ref 5–15)
BUN: 10 mg/dL (ref 8–23)
CALCIUM: 9.8 mg/dL (ref 8.9–10.3)
CHLORIDE: 109 mmol/L (ref 98–111)
CO2: 19 mmol/L — ABNORMAL LOW (ref 22–32)
CREATININE: 0.82 mg/dL (ref 0.44–1.00)
GFR calc non Af Amer: >60 mL/min (ref >60)
Glucose, Bld: 102 mg/dL — ABNORMAL HIGH (ref 70–99)
Potassium: 4 mmol/L (ref 3.5–5.1)
SODIUM: 138 mmol/L (ref 135–145)

## 2018-05-07 LAB — MAGNESIUM: MAGNESIUM: 2.1 mg/dL (ref 1.7–2.4)

## 2018-05-07 NOTE — Addendum Note (Signed)
Encounter addended by: Sherran Needs, NP on: 05/07/2018 10:57 AM  Actions taken: Clinical Note Signed

## 2018-05-07 NOTE — Progress Notes (Addendum)
Primary Care Physician: Harvie Junior, MD Referring Physician: Dr. [name] Nicole [name] is a 68 y.o. female with a h/o anemia,  afib, and syncope possibly 2/2 termination pause  that presented to his office with afib with RVR at 160 bpm and was admitted for Tikosyn load. Converted to SR without pause. Qtc remained stable. She feels improved, no further issues with afib since tikosyn load.  F/u in afib clinic 1/17. She continues to have low afib burden with dofetilide. She is very grateful. She got a new watch and it tracks her HR.  Today, she denies symptoms of palpitations, chest pain, shortness of breath, orthopnea, PND, lower extremity edema, dizziness, presyncope, syncope, or neurologic sequela. The patient is tolerating medications without difficulties and is otherwise without complaint today.   Past Medical History:  Diagnosis Date  . Anticoagulant long-term use 11/16/2014  . Anxiety 10/06/2017  . Arthritis 10/06/2017   knees bilateral  . Asthma 11/16/2014  . Esophageal reflux 11/16/2014  . Essential hypertension 08/15/2015   pt denies  . H/O: GI bleed 12/11/2016  . Hyperlipidemia 11/17/2014  . Migraine headache 11/16/2014  . Nausea 11/16/2014  . Pain in limb 11/16/2014  . Panic disorder 11/16/2014  . Paroxysmal atrial fibrillation ([place]) 11/17/2014  . Swelling of joint 11/16/2014  . Symptomatic anemia 11/24/2016  . Tendonitis 11/16/2014  . UTI (urinary tract infection) 12/19/2015   Past Surgical History:  Procedure Laterality Date  . ABDOMINAL HYSTERECTOMY    . CARDIOVERSION    . CESAREAN SECTION    . COLONOSCOPY    . LOOP RECORDER INSERTION N/A 02/04/2018   Procedure: LOOP RECORDER INSERTION;  Surgeon: [name], MD;  Location: [facility];  Service: Cardiovascular;  Laterality: N/A;  . ROTATOR CUFF REPAIR Left     Current Outpatient Medications  Medication Sig Dispense Refill  . amLODipine (NORVASC) 5 MG tablet TAKE 1 TABLET BY MOUTH EVERY DAY 90 tablet 1    . butalbital-acetaminophen-caffeine (ESGIC) 50-325-40 MG tablet Take 1 tablet by mouth every 8 (eight) hours as needed for headache.    . butorphanol (STADOL) 10 MG/ML nasal spray Place 1 spray into the nose every 4 (four) hours as needed for headache.    . clonazePAM (KLONOPIN) 1 MG tablet Take 1 mg by mouth 3 (three) times daily.     Marland Kitchen dofetilide (TIKOSYN) 500 MCG capsule Take 1 capsule (500 mcg total) by mouth 2 (two) times daily. 14 capsule 0  . ELIQUIS 5 MG TABS tablet Take 5 mg by mouth 2 (two) times daily.  6  . ferrous sulfate 325 (65 FE) MG tablet Take 325 mg by mouth 3 (three) times daily.  5  . fluticasone-salmeterol (ADVAIR HFA) 115-21 MCG/ACT inhaler Inhale 2 puffs into the lungs 2 (two) times daily.    . folic acid (FOLVITE) 1 MG tablet Take 1 mg by mouth daily.  6  . magnesium hydroxide (MILK OF MAGNESIA) 400 MG/5ML suspension Take 30 mLs by mouth daily as needed for mild constipation.    . potassium chloride SA (K-DUR,KLOR-CON) 20 MEQ tablet Take 1 tablet (20 mEq total) by mouth daily. 90 tablet 3  . pravastatin (PRAVACHOL) 40 MG tablet Take 40 mg by mouth at bedtime.    . promethazine (PHENERGAN) 25 MG tablet Take 25 mg by mouth every 6 (six) hours as needed for nausea or vomiting.    Marland Kitchen zolpidem (AMBIEN) 5 MG tablet Take 5 mg by mouth at bedtime.  No current facility-administered medications for this encounter.     Allergies  Allergen Reactions  . Meperidine Nausea And Vomiting    States she "About passed out"  . Prednisone Palpitations    Tachycardia     Social History   Socioeconomic History  . Marital status: Married    Spouse name: Not on file  . Number of children: Not on file  . Years of education: Not on file  . Highest education level: Not on file  Occupational History  . Not on file  Social Needs  . Financial resource strain: Not on file  . Food insecurity:    Worry: Not on file    Inability: Not on file  . Transportation needs:    Medical: Not  on file    Non-medical: Not on file  Tobacco Use  . Smoking status: Never Smoker  . Smokeless tobacco: Never Used  Substance and Sexual Activity  . Alcohol use: Never    Frequency: Never  . Drug use: Never  . Sexual activity: Not on file  Lifestyle  . Physical activity:    Days per week: Not on file    Minutes per session: Not on file  . Stress: Not on file  Relationships  . Social connections:    Talks on phone: Not on file    Gets together: Not on file    Attends religious service: Not on file    Active member of club or organization: Not on file    Attends meetings of clubs or organizations: Not on file    Relationship status: Not on file  . Intimate partner violence:    Fear of current or ex partner: Not on file    Emotionally abused: Not on file    Physically abused: Not on file    Forced sexual activity: Not on file  Other Topics Concern  . Not on file  Social History Narrative   Lives in [place] with spouse   Retired from school system.    Family History  Problem Relation Age of Onset  . Heart attack Mother   . Heart disease Mother   . Heart disease Sister   . Heart disease Sister   . Heart disease Sister   . Heart disease Sister   . Heart disease Sister     ROS- All systems are reviewed and negative except as per the HPI above  Physical Exam: Vitals:   05/07/18 0957  Weight: 88.5 kg  Height: 5\' 8"  (1.727 m)   Wt Readings from Last 3 Encounters:  05/07/18 88.5 kg  02/04/18 90.3 kg  02/04/18 89.7 kg    Labs: Lab Results  Component Value Date   NA 141 01/13/2018   K 3.6 01/13/2018   CL 105 01/13/2018   CO2 26 01/13/2018   GLUCOSE 101 (H) 01/13/2018   BUN 9 01/13/2018   CREATININE 0.81 01/13/2018   CALCIUM 9.4 01/13/2018   MG 2.2 01/13/2018   No results found for: INR No results found for: CHOL, HDL, LDLCALC, TRIG   GEN- The patient is well appearing, alert and oriented x 3 today.   Head- normocephalic, atraumatic Eyes-  Sclera  clear, conjunctiva pink Ears- hearing intact Oropharynx- clear Neck- supple, no JVP Lymph- no cervical lymphadenopathy Lungs- Clear to ausculation bilaterally, normal work of breathing Heart- Regular rate and rhythm, no murmurs, rubs or gallops, PMI not laterally displaced GI- soft, NT, ND, + BS Extremities- no clubbing, cyanosis, or edema MS- no significant deformity  or atrophy Skin- no rash or lesion Psych- euthymic mood, full affect Neuro- strength and sensation are intact  EKG- NSR at 86 ms, pr int 186 ms,  qrs int 84 ms, qtc 477 ms(qt stable) Epic records reduced    Assessment and Plan: 1. Afib  Staying in SR on dofetilide Continue 500 mcg bid  Tikosyn precautions discussed bmet/mag  2. Syncope Possibly from termination pause No further issues Linq in place, per device clinic  3. Chadsvasc score of at least 2 Continue eliquis 5 mg bid   F/u in 3 months   Butch Penny C. Leeroy Lovings, [facility] [address] [place],  [address] [phone]

## 2018-05-08 ENCOUNTER — Other Ambulatory Visit: Payer: Self-pay | Admitting: Orthopedic Surgery

## 2018-05-14 ENCOUNTER — Ambulatory Visit (INDEPENDENT_AMBULATORY_CARE_PROVIDER_SITE_OTHER): Payer: Medicare Other

## 2018-05-14 DIAGNOSIS — I480 Paroxysmal atrial fibrillation: Secondary | ICD-10-CM

## 2018-05-15 NOTE — Progress Notes (Signed)
Carelink Summary Report / Loop Recorder 

## 2018-05-17 LAB — CUP PACEART REMOTE DEVICE CHECK
Date Time Interrogation Session: 20200123171053
Implantable Pulse Generator Implant Date: 20191016

## 2018-05-19 ENCOUNTER — Telehealth (HOSPITAL_COMMUNITY): Payer: Self-pay | Admitting: *Deleted

## 2018-05-19 NOTE — Telephone Encounter (Signed)
Patient called in stating her PCP is taking her off ambien due to no longer prescribing that class of medications. She is unsure of what medication she can safely take with tikosyn. Instructed pt I would send to pharmacy for review and recommendations.

## 2018-05-19 NOTE — Telephone Encounter (Signed)
She can take Ambien with Tikosyn, sounds as though her PCP is just not wanting to prescribe therapy. The only OTC option would be melatonin, however this is less effective than Ambien. Other sleep aids would be prescription medications and she may need to look into another PCP who would be willing to prescribe these for pt.

## 2018-05-19 NOTE — Telephone Encounter (Signed)
Would recommend avoiding mirtazapine due to potential for QTc prolonging.

## 2018-05-20 NOTE — Telephone Encounter (Signed)
Pt notified of recommendations

## 2018-05-20 NOTE — Telephone Encounter (Signed)
Left message to discuss with pt

## 2018-05-20 NOTE — Telephone Encounter (Signed)
She could try ramelteon 8mg  every evening. This is prescription only but would not interfere with her Tikosyn.

## 2018-05-29 ENCOUNTER — Inpatient Hospital Stay (HOSPITAL_COMMUNITY): Admission: RE | Admit: 2018-05-29 | Payer: Medicare Other | Source: Ambulatory Visit

## 2018-06-02 DIAGNOSIS — L578 Other skin changes due to chronic exposure to nonionizing radiation: Secondary | ICD-10-CM | POA: Diagnosis not present

## 2018-06-02 DIAGNOSIS — L814 Other melanin hyperpigmentation: Secondary | ICD-10-CM | POA: Diagnosis not present

## 2018-06-02 DIAGNOSIS — Z85828 Personal history of other malignant neoplasm of skin: Secondary | ICD-10-CM | POA: Diagnosis not present

## 2018-06-02 DIAGNOSIS — L570 Actinic keratosis: Secondary | ICD-10-CM | POA: Diagnosis not present

## 2018-06-02 DIAGNOSIS — Z08 Encounter for follow-up examination after completed treatment for malignant neoplasm: Secondary | ICD-10-CM | POA: Diagnosis not present

## 2018-06-05 ENCOUNTER — Ambulatory Visit: Admit: 2018-06-05 | Payer: Medicare Other | Admitting: Orthopedic Surgery

## 2018-06-05 SURGERY — ARTHROPLASTY, KNEE, TOTAL
Anesthesia: Spinal | Laterality: Left

## 2018-06-16 ENCOUNTER — Ambulatory Visit (INDEPENDENT_AMBULATORY_CARE_PROVIDER_SITE_OTHER): Payer: Medicare Other | Admitting: *Deleted

## 2018-06-16 DIAGNOSIS — I480 Paroxysmal atrial fibrillation: Secondary | ICD-10-CM | POA: Diagnosis not present

## 2018-06-16 LAB — CUP PACEART REMOTE DEVICE CHECK
Date Time Interrogation Session: 20200225173904
Implantable Pulse Generator Implant Date: 20191016

## 2018-06-23 NOTE — Progress Notes (Signed)
Carelink Summary Report / Loop Recorder 

## 2018-07-07 DIAGNOSIS — D509 Iron deficiency anemia, unspecified: Secondary | ICD-10-CM | POA: Diagnosis not present

## 2018-07-11 ENCOUNTER — Other Ambulatory Visit (HOSPITAL_COMMUNITY): Payer: Self-pay | Admitting: Nurse Practitioner

## 2018-07-20 ENCOUNTER — Other Ambulatory Visit: Payer: Self-pay

## 2018-07-20 ENCOUNTER — Ambulatory Visit (INDEPENDENT_AMBULATORY_CARE_PROVIDER_SITE_OTHER): Payer: Medicare Other | Admitting: *Deleted

## 2018-07-20 DIAGNOSIS — I480 Paroxysmal atrial fibrillation: Secondary | ICD-10-CM

## 2018-07-20 LAB — CUP PACEART REMOTE DEVICE CHECK
Implantable Pulse Generator Implant Date: 20191016
MDC IDC SESS DTM: 20200329194111

## 2018-07-30 NOTE — Progress Notes (Signed)
Carelink Summary Report / Loop Recorder 

## 2018-08-06 ENCOUNTER — Ambulatory Visit (HOSPITAL_COMMUNITY): Payer: Medicare Other | Admitting: Nurse Practitioner

## 2018-08-21 ENCOUNTER — Ambulatory Visit (INDEPENDENT_AMBULATORY_CARE_PROVIDER_SITE_OTHER): Payer: Medicare Other | Admitting: *Deleted

## 2018-08-21 ENCOUNTER — Other Ambulatory Visit: Payer: Self-pay

## 2018-08-21 DIAGNOSIS — I480 Paroxysmal atrial fibrillation: Secondary | ICD-10-CM

## 2018-08-23 LAB — CUP PACEART REMOTE DEVICE CHECK
Date Time Interrogation Session: 20200501194231
Implantable Pulse Generator Implant Date: 20191016

## 2018-08-27 NOTE — Progress Notes (Signed)
Carelink Summary Report / Loop Recorder 

## 2018-09-02 ENCOUNTER — Other Ambulatory Visit: Payer: Self-pay | Admitting: *Deleted

## 2018-09-02 ENCOUNTER — Other Ambulatory Visit: Payer: Self-pay | Admitting: Internal Medicine

## 2018-09-02 MED ORDER — ELIQUIS 5 MG PO TABS: 5.0000 mg | ORAL_TABLET | Freq: Two times a day (BID) | ORAL | 6 refills | Status: DC

## 2018-09-02 NOTE — Telephone Encounter (Signed)
[facility] [phone], refill request for Eliquis 5 mg.

## 2018-09-02 NOTE — Telephone Encounter (Signed)
Pt last saw Roderic Palau, NP on 05/07/18, last labs 05/07/08 Creat 0.82, age 68, weight 89.7kg, based on specified criteria pt is on appropriate dosage of Eliquis 5mg  BID.  Will refill rx.

## 2018-09-10 ENCOUNTER — Other Ambulatory Visit: Payer: Self-pay

## 2018-09-10 ENCOUNTER — Ambulatory Visit (HOSPITAL_COMMUNITY): Admission: RE | Admit: 2018-09-10 | Payer: Medicare Other | Source: Ambulatory Visit | Admitting: Physician Assistant

## 2018-09-21 ENCOUNTER — Ambulatory Visit (HOSPITAL_COMMUNITY): Payer: Medicare Other | Admitting: Physician Assistant

## 2018-09-23 ENCOUNTER — Ambulatory Visit (INDEPENDENT_AMBULATORY_CARE_PROVIDER_SITE_OTHER): Payer: Medicare Other | Admitting: *Deleted

## 2018-09-23 DIAGNOSIS — I480 Paroxysmal atrial fibrillation: Secondary | ICD-10-CM

## 2018-09-24 LAB — CUP PACEART REMOTE DEVICE CHECK
Date Time Interrogation Session: 20200603201042
Implantable Pulse Generator Implant Date: 20191016

## 2018-09-29 ENCOUNTER — Emergency Department (HOSPITAL_COMMUNITY): Payer: Medicare Other

## 2018-09-29 ENCOUNTER — Other Ambulatory Visit: Payer: Self-pay

## 2018-09-29 ENCOUNTER — Emergency Department (HOSPITAL_COMMUNITY)
Admission: EM | Admit: 2018-09-29 | Discharge: 2018-09-29 | Disposition: A | Discharge status: Home / Self Care | Payer: Medicare Other | Attending: Emergency Medicine | Admitting: Emergency Medicine

## 2018-09-29 ENCOUNTER — Telehealth (HOSPITAL_COMMUNITY): Payer: Self-pay | Admitting: Nurse Practitioner

## 2018-09-29 DIAGNOSIS — Z79899 Other long term (current) drug therapy: Secondary | ICD-10-CM | POA: Diagnosis not present

## 2018-09-29 DIAGNOSIS — Z7901 Long term (current) use of anticoagulants: Secondary | ICD-10-CM | POA: Diagnosis not present

## 2018-09-29 DIAGNOSIS — J45909 Unspecified asthma, uncomplicated: Secondary | ICD-10-CM | POA: Diagnosis not present

## 2018-09-29 DIAGNOSIS — I480 Paroxysmal atrial fibrillation: Secondary | ICD-10-CM | POA: Insufficient documentation

## 2018-09-29 DIAGNOSIS — I4891 Unspecified atrial fibrillation: Secondary | ICD-10-CM | POA: Diagnosis not present

## 2018-09-29 DIAGNOSIS — I499 Cardiac arrhythmia, unspecified: Secondary | ICD-10-CM | POA: Diagnosis not present

## 2018-09-29 DIAGNOSIS — R0602 Shortness of breath: Secondary | ICD-10-CM | POA: Diagnosis not present

## 2018-09-29 DIAGNOSIS — R079 Chest pain, unspecified: Secondary | ICD-10-CM | POA: Diagnosis not present

## 2018-09-29 DIAGNOSIS — I10 Essential (primary) hypertension: Secondary | ICD-10-CM | POA: Diagnosis not present

## 2018-09-29 DIAGNOSIS — R0789 Other chest pain: Secondary | ICD-10-CM | POA: Diagnosis not present

## 2018-09-29 DIAGNOSIS — R000 Tachycardia, unspecified: Secondary | ICD-10-CM | POA: Diagnosis not present

## 2018-09-29 LAB — BASIC METABOLIC PANEL
Anion gap: 9 (ref 5–15)
BUN: 10 mg/dL (ref 8–23)
CO2: 23 mmol/L (ref 22–32)
Calcium: 9.6 mg/dL (ref 8.9–10.3)
Chloride: 109 mmol/L (ref 98–111)
Creatinine, Ser: 0.71 mg/dL (ref 0.44–1.00)
GFR calc Af Amer: >60 mL/min (ref >60)
GFR calc non Af Amer: >60 mL/min (ref >60)
Glucose, Bld: 109 mg/dL — ABNORMAL HIGH (ref 70–99)
Potassium: 4.1 mmol/L (ref 3.5–5.1)
Sodium: 141 mmol/L (ref 135–145)

## 2018-09-29 LAB — CBC WITH DIFFERENTIAL/PLATELET
Abs Immature Granulocytes: 0.01 10*3/uL (ref 0.00–0.07)
Basophils Absolute: 0 10*3/uL (ref 0.0–0.1)
Basophils Relative: 1 %
Eosinophils Absolute: 0.2 10*3/uL (ref 0.0–0.5)
Eosinophils Relative: 4 %
HCT: 41.1 % (ref 36.0–46.0)
Hemoglobin: 13.3 g/dL (ref 12.0–15.0)
Immature Granulocytes: 0 %
Lymphocytes Relative: 26 %
Lymphs Abs: 1.1 10*3/uL (ref 0.7–4.0)
MCH: 29.2 pg (ref 26.0–34.0)
MCHC: 32.4 g/dL (ref 30.0–36.0)
MCV: 90.3 fL (ref 80.0–100.0)
Monocytes Absolute: 0.6 10*3/uL (ref 0.1–1.0)
Monocytes Relative: 13 %
Neutro Abs: 2.4 10*3/uL (ref 1.7–7.7)
Neutrophils Relative %: 56 %
Platelets: 230 10*3/uL (ref 150–400)
RBC: 4.55 MIL/uL (ref 3.87–5.11)
RDW: 12 % (ref 11.5–15.5)
WBC: 4.3 10*3/uL (ref 4.0–10.5)
nRBC: 0 % (ref 0.0–0.2)

## 2018-09-29 LAB — BRAIN NATRIURETIC PEPTIDE: B Natriuretic Peptide: 136.8 pg/mL — ABNORMAL HIGH (ref 0.0–100.0)

## 2018-09-29 LAB — TROPONIN I: Troponin I: <0.03 ng/mL (ref <0.03)

## 2018-09-29 MED ORDER — PROPOFOL 10 MG/ML IV BOLUS: 0.5000 mg/kg | Freq: Once | INTRAVENOUS | Status: DC

## 2018-09-29 NOTE — ED Provider Notes (Signed)
[facility] EMERGENCY DEPARTMENT Provider Note   CSN: [id number] Arrival date & time: 09/29/18  1039    History   Chief Complaint No chief complaint on file.   HPI Nicole [name] is a 68 y.o. female.     HPI  68 year old female presents with acute atrial fibrillation.  Started around 7:45 AM.  After she had eaten breakfast she felt palpitations, chest pressure and shortness of breath.  Feels similar to prior A. fib.  She reports compliance with all of her medications, including Tikosyn and Eliquis.  She has not been ill prior to this.  She has noticed a little bit of pedal edema over the last couple days.  No cough or fever.  Past Medical History:  Diagnosis Date  . Anticoagulant long-term use 11/16/2014  . Anxiety 10/06/2017  . Arthritis 10/06/2017   knees bilateral  . Asthma 11/16/2014  . Esophageal reflux 11/16/2014  . Essential hypertension 08/15/2015   pt denies  . H/O: GI bleed 12/11/2016  . Hyperlipidemia 11/17/2014  . Migraine headache 11/16/2014  . Nausea 11/16/2014  . Pain in limb 11/16/2014  . Panic disorder 11/16/2014  . Paroxysmal atrial fibrillation ([place]) 11/17/2014  . Swelling of joint 11/16/2014  . Symptomatic anemia 11/24/2016  . Tendonitis 11/16/2014  . UTI (urinary tract infection) 12/19/2015    Patient Active Problem List   Diagnosis Date Noted  . Atrial flutter with rapid ventricular response ([place]) 12/31/2017  . Anxiety 10/06/2017  . Chest pain 10/06/2017  . Arthritis 10/06/2017  . H/O: GI bleed 12/11/2016  . GI bleed 11/24/2016  . Symptomatic anemia 11/24/2016  . Generalized weakness 07/05/2016  . Atrial fibrillation, chronic 07/05/2016  . Chest discomfort 12/19/2015  . UTI (urinary tract infection) 12/19/2015  . Essential hypertension 08/15/2015  . Hyperlipidemia 11/17/2014  . Paroxysmal atrial fibrillation ([place]) 11/17/2014  . Anticoagulant long-term use 11/16/2014  . Asthma 11/16/2014  . Esophageal reflux 11/16/2014  . Migraine  headache 11/16/2014  . Nausea 11/16/2014  . Pain in limb 11/16/2014  . Panic disorder 11/16/2014  . Swelling of joint 11/16/2014  . Tendonitis 11/16/2014    Past Surgical History:  Procedure Laterality Date  . ABDOMINAL HYSTERECTOMY    . CARDIOVERSION    . CESAREAN SECTION    . COLONOSCOPY    . LOOP RECORDER INSERTION N/A 02/04/2018   Procedure: LOOP RECORDER INSERTION;  Surgeon: [name], MD;  Location: [facility];  Service: Cardiovascular;  Laterality: N/A;  . ROTATOR CUFF REPAIR Left      OB History   No obstetric history on file.      Home Medications    Prior to Admission medications   Medication Sig Start Date End Date Taking? Authorizing Provider  amLODipine (NORVASC) 5 MG tablet TAKE 1 TABLET BY MOUTH EVERY DAY 07/14/18   Sherran Needs, NP  butalbital-acetaminophen-caffeine (ESGIC) 50-325-40 MG tablet Take 1 tablet by mouth every 8 (eight) hours as needed for headache.    [provider]  butorphanol (STADOL) 10 MG/ML nasal spray Place 1 spray into the nose every 4 (four) hours as needed for headache.    [provider]  clonazePAM (KLONOPIN) 1 MG tablet Take 1 mg by mouth 3 (three) times daily.     [provider]  dofetilide (TIKOSYN) 500 MCG capsule Take 1 capsule (500 mcg total) by mouth 2 (two) times daily. 01/03/18   Bhagat, Bhavinkumar, PA  ELIQUIS 5 MG TABS tablet Take 1 tablet (5 mg total) by  mouth 2 (two) times daily. 09/02/18   Allred, Jeneen Rinks, MD  ferrous sulfate 325 (65 FE) MG tablet Take 325 mg by mouth 3 (three) times daily. 09/24/17   [provider]  fluticasone-salmeterol (ADVAIR HFA) 115-21 MCG/ACT inhaler Inhale 2 puffs into the lungs 2 (two) times daily.    [provider]  folic acid (FOLVITE) 1 MG tablet Take 1 mg by mouth daily. 09/23/17   [provider]  magnesium hydroxide (MILK OF MAGNESIA) 400 MG/5ML suspension Take 30 mLs by mouth daily as needed for mild constipation.    [provider]  potassium chloride SA (K-DUR,KLOR-CON) 20 MEQ tablet Take 1 tablet (20 mEq total) by mouth daily. 01/13/18   Sherran Needs, NP  pravastatin (PRAVACHOL) 40 MG tablet Take 40 mg by mouth at bedtime.    [provider]  promethazine (PHENERGAN) 25 MG tablet Take 25 mg by mouth every 6 (six) hours as needed for nausea or vomiting.    [provider]  zolpidem (AMBIEN) 5 MG tablet Take 5 mg by mouth at bedtime.    [provider]    Family History Family History  Problem Relation Age of Onset  . Heart attack Mother   . Heart disease Mother   . Heart disease Sister   . Heart disease Sister   . Heart disease Sister   . Heart disease Sister   . Heart disease Sister     Social History Social History   Tobacco Use  . Smoking status: Never Smoker  . Smokeless tobacco: Never Used  Substance Use Topics  . Alcohol use: Never    Frequency: Never  . Drug use: Never     Allergies   Meperidine and Prednisone   Review of Systems Review of Systems  Constitutional: Negative for fever.  Respiratory: Positive for shortness of breath. Negative for cough.   Cardiovascular: Positive for chest pain and leg swelling.  Gastrointestinal: Negative for abdominal pain and vomiting.  All other systems reviewed and are negative.    Physical Exam Updated Vital Signs Ht 5\' 8"  (1.727 m)   Wt 87.1 kg   BMI 29.19 kg/m   Physical Exam Vitals signs and nursing note reviewed.  Constitutional:      Appearance: She is well-developed. She is not ill-appearing or diaphoretic.  HENT:     Head: Normocephalic and atraumatic.     Right Ear: External ear normal.     Left Ear: External ear normal.     Nose: Nose normal.  Eyes:     General:        Right eye: No discharge.        Left eye: No discharge.  Cardiovascular:     Rate and Rhythm: Tachycardia present. Rhythm irregular.     Heart sounds: Normal heart sounds.  Pulmonary:     Effort: Pulmonary effort  is normal.     Breath sounds: Normal breath sounds. No wheezing or rales.  Abdominal:     Palpations: Abdomen is soft.     Tenderness: There is no abdominal tenderness.  Musculoskeletal:     Right lower leg: Edema present.     Left lower leg: Edema present.     Comments: Mild non pitting edema to bilateral feet  Skin:    General: Skin is warm and dry.  Neurological:     Mental Status: She is alert.  Psychiatric:        Mood and Affect: Mood is not anxious.  ED Treatments / Results  Labs (all labs ordered are listed, but only abnormal results are displayed) Labs Reviewed  BRAIN NATRIURETIC PEPTIDE - Abnormal; Notable for the following components:      Result Value   B Natriuretic Peptide 136.8 (*)    All other components within normal limits  BASIC METABOLIC PANEL - Abnormal; Notable for the following components:   Glucose, Bld 109 (*)    All other components within normal limits  CBC WITH DIFFERENTIAL/PLATELET  TROPONIN I    EKG EKG Interpretation  Date/Time:  Tuesday September 29 2018 10:52:25 EDT Ventricular Rate:  129 PR Interval:    QRS Duration: 86 QT Interval:  318 QTC Calculation: 466 R Axis:   64 Text Interpretation:  Atrial fibrillation with RVR Low voltage, extremity leads afib new since Jan 2020 Confirmed by Sherwood Gambler [phone]) on 09/29/2018 11:38:03 AM   EKG Interpretation  Date/Time:  Tuesday September 29 2018 11:40:36 EDT Ventricular Rate:  75 PR Interval:    QRS Duration: 88 QT Interval:  383 QTC Calculation: 428 R Axis:   59 Text Interpretation:  Normal sinus rhythm Short PR interval Borderline low voltage, extremity leads Afib no longer present compared to earlier in the day Confirmed by Sherwood Gambler [phone]) on 09/29/2018 11:43:44 AM       Radiology Dg Chest Portable 1 View  Result Date: 09/29/2018 CLINICAL DATA:  Shortness of breath EXAM: PORTABLE CHEST 1 VIEW COMPARISON:  December 31, 2017 FINDINGS: There is no evident edema or  consolidation. Heart size and pulmonary vascularity are normal. No adenopathy. There is a loop recorder on the left. No bone lesions. IMPRESSION: No edema or consolidation.  Stable cardiac silhouette. Electronically Signed   By: Lowella Grip III M.D.   On: 09/29/2018 11:46    Procedures Procedures (including critical care time)  Medications Ordered in ED Medications - No data to display   Initial Impression / Assessment and Plan / ED Course  I have reviewed the triage vital signs and the nursing notes.  Pertinent labs & imaging results that were available during my care of the patient were reviewed by me and considered in my medical decision making (see chart for details).        Patient presents with recurrent paroxysmal atrial fibrillation.  Planned for ED cardioversion which she had consented to but then while waiting on labs her rhythm spontaneously changed and stayed in normal sinus.  Her chest pressure and dyspnea resolved.  She states this always happens with A. fib.  Thus I think MI/ACS is pretty unlikely.  While she has only 1 troponin, given the symptoms are recurrent with her typical A. fib I think is reasonable to hold off on further troponins and let her go home to follow-up closely with A. fib clinic.  We discussed return precautions.  CHA2DS2/VAS Stroke Risk Points  Current as of 34 minutes ago     3 >= 2 Points: High Risk  1 - 1.99 Points: Medium Risk  0 Points: Low Risk    The patient's score has not changed in the past year.:  No Change     Details    This score determines the patient's risk of having a stroke if the  patient has atrial fibrillation.       Points Metrics  0 Has Congestive Heart Failure:  No    Current as of 34 minutes ago  0 Has Vascular Disease:  No    Current as of 34 minutes  ago  1 Has Hypertension:  Yes    Current as of 34 minutes ago  1 Age:  58    Current as of 34 minutes ago  0 Has Diabetes:  No    Current as of 34 minutes ago   0 Had Stroke:  No  Had TIA:  No  Had thromboembolism:  No    Current as of 34 minutes ago  1 Female:  Yes    Current as of 34 minutes ago            Final Clinical Impressions(s) / ED Diagnoses   Final diagnoses:  Paroxysmal atrial fibrillation with rapid ventricular response [facility])    ED Discharge Orders    None       Sherwood Gambler, MD 09/29/18 1520

## 2018-09-29 NOTE — ED Triage Notes (Signed)
Pt reports feeling rapid heart rate starting at 0745 with SOB.  Pain and pressure to L side chest.  Last time she had episode was last September.  Hx of ablation and recorder.  Pt alert and oriented with no diaphoresis. No cough or fever noted.

## 2018-09-29 NOTE — Discharge Instructions (Signed)
If you develop recurrent, continued, or worsening chest pain, shortness of breath, fever, vomiting, abdominal or back pain, or any other new/concerning symptoms then return to the ER for evaluation.  

## 2018-09-29 NOTE — Telephone Encounter (Signed)
Pt called and said that her HR was very elevated this am with HR 178-189 bpm.. BP  around 497 systolic. She is not on any type of rate control and feels like she is going to pass out. She did take  am tikosyn. SHe was adviserd since she did not have any rate control meds in the house, and the fact that her HR was so elevated and very symptomatic, feeling  like she may pass out,  either call 911 or get someone to drive her to the nearest  ER for further treatment.

## 2018-09-29 NOTE — ED Notes (Signed)
This RN acting as Art therapist and asked pt if she would like for me to call any family/friends. Pt declined and states she is able to keep them informed.

## 2018-09-29 NOTE — ED Notes (Signed)
Pt HR converted while obtaining lab samples from IV.  States minimal improvement of her sx.  MD notified and visited with pt.

## 2018-09-29 NOTE — ED Notes (Signed)
Pt talkative about her hx and previous admission.  No gross dyspnea observed during conversation.

## 2018-09-30 ENCOUNTER — Other Ambulatory Visit (HOSPITAL_COMMUNITY): Payer: Self-pay | Admitting: *Deleted

## 2018-09-30 ENCOUNTER — Telehealth: Payer: Self-pay

## 2018-09-30 MED ORDER — DILTIAZEM HCL 30 MG PO TABS: ORAL_TABLET | ORAL | 1 refills | Status: DC

## 2018-09-30 NOTE — Telephone Encounter (Signed)
Spoke to pt regarding symptom activation, states she felt light headed, dizzy, clammy, and SOB; pt called 911 and EMS took her to [facility] ED on 09/29/18. Per ED note, pt had AF w/ RVR, spontaneously converted back to SR. Pt compliant with dofetilide. Pt has f/u w/ AF clinic 10/06/18.

## 2018-10-02 NOTE — Progress Notes (Signed)
Carelink Summary Report / Loop Recorder 

## 2018-10-06 ENCOUNTER — Ambulatory Visit (HOSPITAL_COMMUNITY)
Admission: RE | Admit: 2018-10-06 | Discharge: 2018-10-06 | Disposition: A | Discharge status: Home / Self Care | Payer: Medicare Other | Source: Ambulatory Visit | Attending: Nurse Practitioner | Admitting: Nurse Practitioner

## 2018-10-06 ENCOUNTER — Other Ambulatory Visit: Payer: Self-pay

## 2018-10-06 ENCOUNTER — Encounter (HOSPITAL_COMMUNITY): Payer: Self-pay | Admitting: Physician Assistant

## 2018-10-06 VITALS — BP 136/88 | HR 76 | Ht 68.0 in | Wt 189.8 lb

## 2018-10-06 DIAGNOSIS — Z79899 Other long term (current) drug therapy: Secondary | ICD-10-CM | POA: Insufficient documentation

## 2018-10-06 DIAGNOSIS — Z8249 Family history of ischemic heart disease and other diseases of the circulatory system: Secondary | ICD-10-CM | POA: Diagnosis not present

## 2018-10-06 DIAGNOSIS — M170 Bilateral primary osteoarthritis of knee: Secondary | ICD-10-CM | POA: Insufficient documentation

## 2018-10-06 DIAGNOSIS — K219 Gastro-esophageal reflux disease without esophagitis: Secondary | ICD-10-CM | POA: Diagnosis not present

## 2018-10-06 DIAGNOSIS — J45909 Unspecified asthma, uncomplicated: Secondary | ICD-10-CM | POA: Diagnosis not present

## 2018-10-06 DIAGNOSIS — Z7951 Long term (current) use of inhaled steroids: Secondary | ICD-10-CM | POA: Insufficient documentation

## 2018-10-06 DIAGNOSIS — Z87440 Personal history of urinary (tract) infections: Secondary | ICD-10-CM | POA: Insufficient documentation

## 2018-10-06 DIAGNOSIS — E785 Hyperlipidemia, unspecified: Secondary | ICD-10-CM | POA: Diagnosis not present

## 2018-10-06 DIAGNOSIS — Z888 Allergy status to other drugs, medicaments and biological substances status: Secondary | ICD-10-CM | POA: Insufficient documentation

## 2018-10-06 DIAGNOSIS — I480 Paroxysmal atrial fibrillation: Secondary | ICD-10-CM | POA: Diagnosis not present

## 2018-10-06 DIAGNOSIS — D649 Anemia, unspecified: Secondary | ICD-10-CM | POA: Insufficient documentation

## 2018-10-06 DIAGNOSIS — Z7901 Long term (current) use of anticoagulants: Secondary | ICD-10-CM | POA: Diagnosis not present

## 2018-10-06 DIAGNOSIS — F419 Anxiety disorder, unspecified: Secondary | ICD-10-CM | POA: Insufficient documentation

## 2018-10-06 DIAGNOSIS — R609 Edema, unspecified: Secondary | ICD-10-CM | POA: Diagnosis not present

## 2018-10-06 DIAGNOSIS — I4891 Unspecified atrial fibrillation: Secondary | ICD-10-CM | POA: Diagnosis present

## 2018-10-06 DIAGNOSIS — I10 Essential (primary) hypertension: Secondary | ICD-10-CM | POA: Diagnosis not present

## 2018-10-06 LAB — MAGNESIUM: Magnesium: 2.3 mg/dL (ref 1.7–2.4)

## 2018-10-06 MED ORDER — FUROSEMIDE 20 MG PO TABS: ORAL_TABLET | ORAL | 1 refills | Status: DC

## 2018-10-06 MED ORDER — METOPROLOL SUCCINATE ER 25 MG PO TB24: 25.0000 mg | ORAL_TABLET | Freq: Every day | ORAL | 3 refills | Status: DC

## 2018-10-06 NOTE — Progress Notes (Signed)
Primary Care Physician: Harvie Junior, MD Referring Physician: Dr. [name] Lewing is a 68 y.o. female with a h/o anemia,  afib, and syncope possibly 2/2 termination pause  that presented to his office with afib with RVR at 160 bpm and was admitted for Tikosyn load. Converted to SR without pause. Qtc remained stable. She feels improved, no further issues with afib since tikosyn load.  F/u in afib clinic 1/17. She continues to have low afib burden with dofetilide. She is very grateful. She got a new watch and it tracks her HR.  F/u AF clinic 10/06/18. Patient reports that she has done well since her last visit until 09/29/18 when she developed palpitations, chest pressure, and SOB. She presented to the ER and was found to be in afib with RVR but converted spontaneously prior to DCCV. She is very anxious about her heart. She also reports some increased leg edema.  Today, she denies symptoms of chest pain, shortness of breath, orthopnea, PND, dizziness, presyncope, syncope, or neurologic sequela. The patient is tolerating medications without difficulties and is otherwise without complaint today.   Past Medical History:  Diagnosis Date   Anticoagulant long-term use 11/16/2014   Anxiety 10/06/2017   Arthritis 10/06/2017   knees bilateral   Asthma 11/16/2014   Esophageal reflux 11/16/2014   Essential hypertension 08/15/2015   pt denies   H/O: GI bleed 12/11/2016   Hyperlipidemia 11/17/2014   Migraine headache 11/16/2014   Nausea 11/16/2014   Pain in limb 11/16/2014   Panic disorder 11/16/2014   Paroxysmal atrial fibrillation ([place]) 11/17/2014   Swelling of joint 11/16/2014   Symptomatic anemia 11/24/2016   Tendonitis 11/16/2014   UTI (urinary tract infection) 12/19/2015   Past Surgical History:  Procedure Laterality Date   ABDOMINAL HYSTERECTOMY     CARDIOVERSION     CESAREAN SECTION     COLONOSCOPY     LOOP RECORDER INSERTION N/A 02/04/2018   Procedure: LOOP  RECORDER INSERTION;  Surgeon: [name], MD;  Location: [facility];  Service: Cardiovascular;  Laterality: N/A;   ROTATOR CUFF REPAIR Left     Current Outpatient Medications  Medication Sig Dispense Refill   acetaminophen (TYLENOL) 500 MG tablet Take 500 mg by mouth every 6 (six) hours as needed for mild pain or headache.     amLODipine (NORVASC) 5 MG tablet TAKE 1 TABLET BY MOUTH EVERY DAY (Patient taking differently: Take 5 mg by mouth daily. ) 90 tablet 1   butalbital-acetaminophen-caffeine (ESGIC) 50-325-40 MG tablet Take 1 tablet by mouth every 8 (eight) hours as needed for headache.     butorphanol (STADOL) 10 MG/ML nasal spray Place 1 spray into the nose every 4 (four) hours as needed for headache.     clonazePAM (KLONOPIN) 1 MG tablet Take 1 mg by mouth 3 (three) times daily.      diltiazem (CARDIZEM) 30 MG tablet Cardizem 30mg  -- take 1 tablet every 4 hours AS NEEDED for AFIB heart rate >100 45 tablet 1   dofetilide (TIKOSYN) 500 MCG capsule Take 1 capsule (500 mcg total) by mouth 2 (two) times daily. 14 capsule 0   ELIQUIS 5 MG TABS tablet Take 1 tablet (5 mg total) by mouth 2 (two) times daily. 60 tablet 6   ferrous sulfate 325 (65 FE) MG tablet Take 325 mg by mouth 3 (three) times daily.  5   fluticasone-salmeterol (ADVAIR HFA) 115-21 MCG/ACT inhaler Inhale 2 puffs into the lungs 2 (two) times daily.  folic acid (FOLVITE) 1 MG tablet Take 1 mg by mouth daily.  6   magnesium hydroxide (MILK OF MAGNESIA) 400 MG/5ML suspension Take 30 mLs by mouth daily as needed for mild constipation.     potassium chloride SA (K-DUR,KLOR-CON) 20 MEQ tablet Take 1 tablet (20 mEq total) by mouth daily. 90 tablet 3   pravastatin (PRAVACHOL) 40 MG tablet Take 40 mg by mouth at bedtime.     promethazine (PHENERGAN) 25 MG tablet Take 25 mg by mouth every 6 (six) hours as needed for nausea or vomiting.     No current facility-administered medications for this encounter.      Allergies  Allergen Reactions   Meperidine Nausea And Vomiting    States she "About passed out"   Prednisone Palpitations    Tachycardia     Social History   Socioeconomic History   Marital status: Married    Spouse name: Not on file   Number of children: Not on file   Years of education: Not on file   Highest education level: Not on file  Occupational History   Not on file  Social Needs   Financial resource strain: Not on file   Food insecurity    Worry: Not on file    Inability: Not on file   Transportation needs    Medical: Not on file    Non-medical: Not on file  Tobacco Use   Smoking status: Never Smoker   Smokeless tobacco: Never Used  Substance and Sexual Activity   Alcohol use: Never    Frequency: Never   Drug use: Never   Sexual activity: Not on file  Lifestyle   Physical activity    Days per week: Not on file    Minutes per session: Not on file   Stress: Not on file  Relationships   Social connections    Talks on phone: Not on file    Gets together: Not on file    Attends religious service: Not on file    Active member of club or organization: Not on file    Attends meetings of clubs or organizations: Not on file    Relationship status: Not on file   Intimate partner violence    Fear of current or ex partner: Not on file    Emotionally abused: Not on file    Physically abused: Not on file    Forced sexual activity: Not on file  Other Topics Concern   Not on file  Social History Narrative   Lives in [place] with spouse   Retired from school system.    Family History  Problem Relation Age of Onset   Heart attack Mother    Heart disease Mother    Heart disease Sister    Heart disease Sister    Heart disease Sister    Heart disease Sister    Heart disease Sister     ROS- All systems are reviewed and negative except as per the HPI above  Physical Exam: There were no vitals filed for this visit. Wt  Readings from Last 3 Encounters:  09/29/18 87.1 kg  05/07/18 88.5 kg  02/04/18 90.3 kg    Labs: Lab Results  Component Value Date   NA 141 09/29/2018   K 4.1 09/29/2018   CL 109 09/29/2018   CO2 23 09/29/2018   GLUCOSE 109 (H) 09/29/2018   BUN 10 09/29/2018   CREATININE 0.71 09/29/2018   CALCIUM 9.6 09/29/2018   MG 2.1 05/07/2018  No results found for: INR No results found for: CHOL, HDL, LDLCALC, TRIG  GEN- The patient is well appearing, alert and oriented x 3 today.   HEENT-head normocephalic, atraumatic, sclera clear, conjunctiva pink, hearing intact, trachea midline. Lungs- Clear to ausculation bilaterally, normal work of breathing Heart- Regular rate and rhythm, no murmurs, rubs or gallops  GI- soft, NT, ND, + BS Extremities- no clubbing, cyanosis. Non pitting edema MS- no significant deformity or atrophy Skin- no rash or lesion Psych- euthymic mood, full affect Neuro- strength and sensation are intact   EKG- SR HR 76, PR 170, QRS 80, QTc 450  Epic records reviewed   Assessment and Plan: 1. Paroxysmal atrial fibrillation   Patient had recent symptomatic episode but spontaneously converted. We discussed therapeutic options.  Continue dofetilide 500 mcg bid, QT stable. Will start Toprol 25 mg daily. Continue diltiazem 30 mg PRN q 4hrs for heart racing. Bmet/mag today  This patients CHA2DS2-VASc Score and unadjusted Ischemic Stroke Rate (% per year) is equal to 3.2 % stroke rate/year from a score of 3  Above score calculated as 1 point each if present [CHF, HTN, DM, Vascular=MI/PAD/Aortic Plaque, Age if 65-74, or Female] Above score calculated as 2 points each if present [Age > 75, or Stroke/TIA/TE]  2. HTN Stable, no changes today.  3. Edema Start Lasix 20 mg x4 days.    F/u in AF clinic in 2 weeks.   [facility] [address] [place], [place] [address] [phone]

## 2018-10-06 NOTE — Patient Instructions (Addendum)
Start metoprolol 25mg  once a day   Take lasix 20mg  once a day for the next 4 days then only as needed for swelling

## 2018-10-13 ENCOUNTER — Ambulatory Visit (HOSPITAL_COMMUNITY): Payer: Medicare Other | Admitting: Nurse Practitioner

## 2018-10-15 ENCOUNTER — Other Ambulatory Visit: Payer: Self-pay

## 2018-10-15 ENCOUNTER — Ambulatory Visit (HOSPITAL_COMMUNITY)
Admission: RE | Admit: 2018-10-15 | Discharge: 2018-10-15 | Disposition: A | Discharge status: Home / Self Care | Payer: Medicare Other | Source: Ambulatory Visit | Attending: Nurse Practitioner | Admitting: Nurse Practitioner

## 2018-10-15 VITALS — BP 126/82 | HR 92 | Ht 68.0 in | Wt 188.4 lb

## 2018-10-15 DIAGNOSIS — I480 Paroxysmal atrial fibrillation: Secondary | ICD-10-CM

## 2018-10-15 DIAGNOSIS — J45909 Unspecified asthma, uncomplicated: Secondary | ICD-10-CM | POA: Diagnosis not present

## 2018-10-15 DIAGNOSIS — R609 Edema, unspecified: Secondary | ICD-10-CM | POA: Diagnosis not present

## 2018-10-15 DIAGNOSIS — M170 Bilateral primary osteoarthritis of knee: Secondary | ICD-10-CM | POA: Diagnosis not present

## 2018-10-15 DIAGNOSIS — I10 Essential (primary) hypertension: Secondary | ICD-10-CM | POA: Diagnosis not present

## 2018-10-15 DIAGNOSIS — Z79899 Other long term (current) drug therapy: Secondary | ICD-10-CM | POA: Insufficient documentation

## 2018-10-15 DIAGNOSIS — Z7901 Long term (current) use of anticoagulants: Secondary | ICD-10-CM | POA: Insufficient documentation

## 2018-10-15 DIAGNOSIS — E785 Hyperlipidemia, unspecified: Secondary | ICD-10-CM | POA: Insufficient documentation

## 2018-10-15 NOTE — Progress Notes (Signed)
Primary Care Physician: Harvie Junior, MD Referring Physician: Dr. [name] Kettlewell is a 68 y.o. female with a h/o anemia,  afib, and syncope possibly 2/2 termination pause  that presented to his office with afib with RVR at 160 bpm and was admitted for Tikosyn load. Converted to SR without pause. Qtc remained stable. She feels improved, no further issues with afib since tikosyn load.  F/u in afib clinic 1/17. She continues to have low afib burden with dofetilide. She is very grateful. She got a new watch and it tracks her HR.  F/u AF clinic 10/06/18. Patient reports that she has done well since her last visit until 09/29/18 when she developed palpitations, chest pressure, and SOB. She presented to the ER and was found to be in afib with RVR but converted spontaneously prior to DCCV. She is very anxious about her heart. She also reports some increased leg edema.  F/u AF clinic 10/15/18. Patient reports that she has not had any further heart racing or palpitations but she has been more fatigued since starting the BB. No CP, SOB, or dizziness.   Today, she denies symptoms of palpitations, chest pain, shortness of breath, orthopnea, PND, dizziness, presyncope, syncope, or neurologic sequela. The patient is tolerating medications without difficulties and is otherwise without complaint today.   Past Medical History:  Diagnosis Date  . Anticoagulant long-term use 11/16/2014  . Anxiety 10/06/2017  . Arthritis 10/06/2017   knees bilateral  . Asthma 11/16/2014  . Esophageal reflux 11/16/2014  . Essential hypertension 08/15/2015   pt denies  . H/O: GI bleed 12/11/2016  . Hyperlipidemia 11/17/2014  . Migraine headache 11/16/2014  . Nausea 11/16/2014  . Pain in limb 11/16/2014  . Panic disorder 11/16/2014  . Paroxysmal atrial fibrillation ([place]) 11/17/2014  . Swelling of joint 11/16/2014  . Symptomatic anemia 11/24/2016  . Tendonitis 11/16/2014  . UTI (urinary tract infection) 12/19/2015   Past  Surgical History:  Procedure Laterality Date  . ABDOMINAL HYSTERECTOMY    . CARDIOVERSION    . CESAREAN SECTION    . COLONOSCOPY    . LOOP RECORDER INSERTION N/A 02/04/2018   Procedure: LOOP RECORDER INSERTION;  Surgeon: [name], MD;  Location: [facility];  Service: Cardiovascular;  Laterality: N/A;  . ROTATOR CUFF REPAIR Left     Current Outpatient Medications  Medication Sig Dispense Refill  . acetaminophen (TYLENOL) 500 MG tablet Take 500 mg by mouth every 6 (six) hours as needed for mild pain or headache.    Marland Kitchen amLODipine (NORVASC) 5 MG tablet TAKE 1 TABLET BY MOUTH EVERY DAY (Patient taking differently: Take 5 mg by mouth daily. ) 90 tablet 1  . butalbital-acetaminophen-caffeine (ESGIC) 50-325-40 MG tablet Take 1 tablet by mouth every 8 (eight) hours as needed for headache.    . butorphanol (STADOL) 10 MG/ML nasal spray Place 1 spray into the nose every 4 (four) hours as needed for headache.    . clonazePAM (KLONOPIN) 1 MG tablet Take 1 mg by mouth 3 (three) times daily.     Marland Kitchen diltiazem (CARDIZEM) 30 MG tablet Cardizem 30mg  -- take 1 tablet every 4 hours AS NEEDED for AFIB heart rate >100 (Patient not taking: Reported on 10/06/2018) 45 tablet 1  . dofetilide (TIKOSYN) 500 MCG capsule Take 1 capsule (500 mcg total) by mouth 2 (two) times daily. 14 capsule 0  . ELIQUIS 5 MG TABS tablet Take 1 tablet (5 mg total) by mouth 2 (two) times daily.  60 tablet 6  . ferrous sulfate 325 (65 FE) MG tablet Take 325 mg by mouth 3 (three) times daily.  5  . fluticasone-salmeterol (ADVAIR HFA) 115-21 MCG/ACT inhaler Inhale 2 puffs into the lungs 2 (two) times daily.    . folic acid (FOLVITE) 1 MG tablet Take 1 mg by mouth daily.  6  . furosemide (LASIX) 20 MG tablet Take 1 tablet by mouth the next 4 days then daily as needed for swelling 30 tablet 1  . magnesium hydroxide (MILK OF MAGNESIA) 400 MG/5ML suspension Take 30 mLs by mouth daily as needed for mild constipation.    . metoprolol  succinate (TOPROL XL) 25 MG 24 hr tablet Take 1 tablet (25 mg total) by mouth at bedtime. 30 tablet 3  . potassium chloride SA (K-DUR,KLOR-CON) 20 MEQ tablet Take 1 tablet (20 mEq total) by mouth daily. 90 tablet 3  . pravastatin (PRAVACHOL) 40 MG tablet Take 40 mg by mouth at bedtime.    . promethazine (PHENERGAN) 25 MG tablet Take 25 mg by mouth every 6 (six) hours as needed for nausea or vomiting.     No current facility-administered medications for this visit.     Allergies  Allergen Reactions  . Meperidine Nausea And Vomiting    States she "About passed out"  . Prednisone Palpitations    Tachycardia     Social History   Socioeconomic History  . Marital status: Married    Spouse name: Not on file  . Number of children: Not on file  . Years of education: Not on file  . Highest education level: Not on file  Occupational History  . Not on file  Social Needs  . Financial resource strain: Not on file  . Food insecurity    Worry: Not on file    Inability: Not on file  . Transportation needs    Medical: Not on file    Non-medical: Not on file  Tobacco Use  . Smoking status: Never Smoker  . Smokeless tobacco: Never Used  Substance and Sexual Activity  . Alcohol use: Never    Frequency: Never  . Drug use: Never  . Sexual activity: Not on file  Lifestyle  . Physical activity    Days per week: Not on file    Minutes per session: Not on file  . Stress: Not on file  Relationships  . Social Herbalist on phone: Not on file    Gets together: Not on file    Attends religious service: Not on file    Active member of club or organization: Not on file    Attends meetings of clubs or organizations: Not on file    Relationship status: Not on file  . Intimate partner violence    Fear of current or ex partner: Not on file    Emotionally abused: Not on file    Physically abused: Not on file    Forced sexual activity: Not on file  Other Topics Concern  . Not on file   Social History Narrative   Lives in [place] with spouse   Retired from school system.    Family History  Problem Relation Age of Onset  . Heart attack Mother   . Heart disease Mother   . Heart disease Sister   . Heart disease Sister   . Heart disease Sister   . Heart disease Sister   . Heart disease Sister     ROS- All systems are reviewed  and negative except as per the HPI above  Physical Exam: There were no vitals filed for this visit. Wt Readings from Last 3 Encounters:  10/06/18 189 lb 12.8 oz (86.1 kg)  09/29/18 192 lb (87.1 kg)  05/07/18 195 lb (88.5 kg)    Labs: Lab Results  Component Value Date   NA 141 09/29/2018   K 4.1 09/29/2018   CL 109 09/29/2018   CO2 23 09/29/2018   GLUCOSE 109 (H) 09/29/2018   BUN 10 09/29/2018   CREATININE 0.71 09/29/2018   CALCIUM 9.6 09/29/2018   MG 2.3 10/06/2018   No results found for: INR No results found for: CHOL, HDL, LDLCALC, TRIG  GEN- The patient is well appearing, alert and oriented x 3 today.   HEENT-head normocephalic, atraumatic, sclera clear, conjunctiva pink, hearing intact, trachea midline. Lungs- Clear to ausculation bilaterally, normal work of breathing Heart- Regular rate and rhythm, no murmurs, rubs or gallops  GI- soft, NT, ND, + BS Extremities- no clubbing, cyanosis. Non-pitting edema MS- no significant deformity or atrophy Skin- no rash or lesion Psych- euthymic mood, full affect Neuro- strength and sensation are intact   EKG- SR HR 92, PR 172, QRS 76, QTc 422  Epic records reviewed   Assessment and Plan: 1. Paroxysmal atrial fibrillation   Patient has not had any further heart racing. Patient expresses concern that her ILR did not seem to alert anyone of her afib episode. We discussed the mechanism and purpose of the ILR in detail today.  Continue dofetilide 500 mcg bid, QT stable. Will continue Toprol 25 mg daily. If fatigue becomes intolerable or does not improve, will decrease/stop.  Continue diltiazem 30 mg PRN q 4hrs for heart racing. Patient would like to also discuss questions about ILR with Dr Rayann Heman as well as possible repeat ablation. We discussed that she has a low burden of afib and possibly may not be a candidate for ablation at this time. She voices understanding.   This patients CHA2DS2-VASc Score and unadjusted Ischemic Stroke Rate (% per year) is equal to 3.2 % stroke rate/year from a score of 3  Above score calculated as 1 point each if present [CHF, HTN, DM, Vascular=MI/PAD/Aortic Plaque, Age if 65-74, or Female] Above score calculated as 2 points each if present [Age > 75, or Stroke/TIA/TE]  2. HTN Stable, no changes today.  3. Edema Improved with short course of Lasix. No changes for now.    F/u with Dr Rayann Heman in 2 months.    [facility] [address] [place], [place] [address] [phone]

## 2018-10-25 ENCOUNTER — Other Ambulatory Visit: Payer: Self-pay

## 2018-10-25 ENCOUNTER — Emergency Department (HOSPITAL_COMMUNITY): Payer: Medicare Other

## 2018-10-25 ENCOUNTER — Emergency Department (HOSPITAL_COMMUNITY)
Admission: EM | Admit: 2018-10-25 | Discharge: 2018-10-25 | Disposition: A | Discharge status: Home / Self Care | Payer: Medicare Other | Attending: Emergency Medicine | Admitting: Emergency Medicine

## 2018-10-25 DIAGNOSIS — J45909 Unspecified asthma, uncomplicated: Secondary | ICD-10-CM | POA: Insufficient documentation

## 2018-10-25 DIAGNOSIS — R000 Tachycardia, unspecified: Secondary | ICD-10-CM | POA: Diagnosis not present

## 2018-10-25 DIAGNOSIS — R52 Pain, unspecified: Secondary | ICD-10-CM | POA: Diagnosis not present

## 2018-10-25 DIAGNOSIS — R0602 Shortness of breath: Secondary | ICD-10-CM | POA: Insufficient documentation

## 2018-10-25 DIAGNOSIS — I10 Essential (primary) hypertension: Secondary | ICD-10-CM | POA: Diagnosis not present

## 2018-10-25 DIAGNOSIS — Z7901 Long term (current) use of anticoagulants: Secondary | ICD-10-CM | POA: Diagnosis not present

## 2018-10-25 DIAGNOSIS — I4891 Unspecified atrial fibrillation: Secondary | ICD-10-CM | POA: Diagnosis not present

## 2018-10-25 DIAGNOSIS — Z79899 Other long term (current) drug therapy: Secondary | ICD-10-CM | POA: Insufficient documentation

## 2018-10-25 DIAGNOSIS — R0989 Other specified symptoms and signs involving the circulatory and respiratory systems: Secondary | ICD-10-CM | POA: Diagnosis not present

## 2018-10-25 DIAGNOSIS — R002 Palpitations: Secondary | ICD-10-CM | POA: Diagnosis present

## 2018-10-25 LAB — URINALYSIS, ROUTINE W REFLEX MICROSCOPIC
Bilirubin Urine: NEGATIVE
Glucose, UA: NEGATIVE mg/dL
Hgb urine dipstick: NEGATIVE
Ketones, ur: NEGATIVE mg/dL
Nitrite: NEGATIVE
Protein, ur: NEGATIVE mg/dL
Specific Gravity, Urine: 1.003 — ABNORMAL LOW (ref 1.005–1.030)
pH: 6 (ref 5.0–8.0)

## 2018-10-25 LAB — BASIC METABOLIC PANEL
Anion gap: 8 (ref 5–15)
BUN: 15 mg/dL (ref 8–23)
CO2: 23 mmol/L (ref 22–32)
Calcium: 9.8 mg/dL (ref 8.9–10.3)
Chloride: 107 mmol/L (ref 98–111)
Creatinine, Ser: 0.82 mg/dL (ref 0.44–1.00)
GFR calc Af Amer: >60 mL/min (ref >60)
GFR calc non Af Amer: >60 mL/min (ref >60)
Glucose, Bld: 93 mg/dL (ref 70–99)
Potassium: 5.1 mmol/L (ref 3.5–5.1)
Sodium: 138 mmol/L (ref 135–145)

## 2018-10-25 LAB — CBC
HCT: 42.6 % (ref 36.0–46.0)
Hemoglobin: 13.8 g/dL (ref 12.0–15.0)
MCH: 29.4 pg (ref 26.0–34.0)
MCHC: 32.4 g/dL (ref 30.0–36.0)
MCV: 90.6 fL (ref 80.0–100.0)
Platelets: 238 10*3/uL (ref 150–400)
RBC: 4.7 MIL/uL (ref 3.87–5.11)
RDW: 12.1 % (ref 11.5–15.5)
WBC: 5.5 10*3/uL (ref 4.0–10.5)
nRBC: 0 % (ref 0.0–0.2)

## 2018-10-25 LAB — MAGNESIUM: Magnesium: 2.1 mg/dL (ref 1.7–2.4)

## 2018-10-25 MED ORDER — SODIUM CHLORIDE 0.9% FLUSH
3.0000 mL | Freq: Once | INTRAVENOUS | Status: AC
  Administered 2018-10-25: 3 mL via INTRAVENOUS

## 2018-10-25 NOTE — ED Provider Notes (Addendum)
[facility] EMERGENCY DEPARTMENT Provider Note   CSN: [id number] Arrival date & time: 10/25/18  1037    History   Chief Complaint Chief Complaint  Patient presents with  . Atrial Fibrillation    HPI Nicole [name] is a 68 y.o. female.     68 yo F with a chief complaints of palpitations.  Patient has a history of paroxysmal atrial fibrillation.  She is acutely aware of this and knows exactly when it starts.  Started this morning while she was watching TV.  Had some mild shortness of breath with this.  Before the symptoms started she had no complaints.  Has not had cough congestion fever no chest pain shortness of breath no abdominal pain vomiting diarrhea.  Has not been on a recent diet or restricted her fluid intake.  The history is provided by the patient.  Atrial Fibrillation Associated symptoms include shortness of breath. Pertinent negatives include no chest pain and no headaches.    Past Medical History:  Diagnosis Date  . Anticoagulant long-term use 11/16/2014  . Anxiety 10/06/2017  . Arthritis 10/06/2017   knees bilateral  . Asthma 11/16/2014  . Esophageal reflux 11/16/2014  . Essential hypertension 08/15/2015   pt denies  . H/O: GI bleed 12/11/2016  . Hyperlipidemia 11/17/2014  . Migraine headache 11/16/2014  . Nausea 11/16/2014  . Pain in limb 11/16/2014  . Panic disorder 11/16/2014  . Paroxysmal atrial fibrillation ([place]) 11/17/2014  . Swelling of joint 11/16/2014  . Symptomatic anemia 11/24/2016  . Tendonitis 11/16/2014  . UTI (urinary tract infection) 12/19/2015    Patient Active Problem List   Diagnosis Date Noted  . Atrial flutter with rapid ventricular response ([place]) 12/31/2017  . Anxiety 10/06/2017  . Chest pain 10/06/2017  . Arthritis 10/06/2017  . H/O: GI bleed 12/11/2016  . GI bleed 11/24/2016  . Symptomatic anemia 11/24/2016  . Generalized weakness 07/05/2016  . Atrial fibrillation, chronic 07/05/2016  . Chest discomfort 12/19/2015  .  UTI (urinary tract infection) 12/19/2015  . Essential hypertension 08/15/2015  . Hyperlipidemia 11/17/2014  . Paroxysmal atrial fibrillation ([place]) 11/17/2014  . Anticoagulant long-term use 11/16/2014  . Asthma 11/16/2014  . Esophageal reflux 11/16/2014  . Migraine headache 11/16/2014  . Nausea 11/16/2014  . Pain in limb 11/16/2014  . Panic disorder 11/16/2014  . Swelling of joint 11/16/2014  . Tendonitis 11/16/2014    Past Surgical History:  Procedure Laterality Date  . ABDOMINAL HYSTERECTOMY    . CARDIOVERSION    . CESAREAN SECTION    . COLONOSCOPY    . LOOP RECORDER INSERTION N/A 02/04/2018   Procedure: LOOP RECORDER INSERTION;  Surgeon: [name], MD;  Location: [facility];  Service: Cardiovascular;  Laterality: N/A;  . ROTATOR CUFF REPAIR Left      OB History   No obstetric history on file.      Home Medications    Prior to Admission medications   Medication Sig Start Date End Date Taking? Authorizing Provider  acetaminophen (TYLENOL) 500 MG tablet Take 500 mg by mouth every 6 (six) hours as needed for mild pain or headache.    [provider]  amLODipine (NORVASC) 5 MG tablet TAKE 1 TABLET BY MOUTH EVERY DAY Patient taking differently: Take 5 mg by mouth daily.  07/14/18   Sherran Needs, NP  butalbital-acetaminophen-caffeine (ESGIC) [phone] MG tablet Take 1 tablet by mouth every 8 (eight) hours as needed for headache.    [provider]  butorphanol (STADOL)  10 MG/ML nasal spray Place 1 spray into the nose every 4 (four) hours as needed for headache.    [provider]  clonazePAM (KLONOPIN) 1 MG tablet Take 1 mg by mouth 3 (three) times daily.     [provider]  diltiazem (CARDIZEM) 30 MG tablet Cardizem 30mg  -- take 1 tablet every 4 hours AS NEEDED for AFIB heart rate >100 Patient not taking: Reported on 10/06/2018 09/30/18   Sherran Needs, NP  dofetilide (TIKOSYN) 500 MCG capsule Take 1 capsule (500 mcg total)  by mouth 2 (two) times daily. 01/03/18   Bhagat, Bhavinkumar, PA  ELIQUIS 5 MG TABS tablet Take 1 tablet (5 mg total) by mouth 2 (two) times daily. 09/02/18   Allred, Jeneen Rinks, MD  ferrous sulfate 325 (65 FE) MG tablet Take 325 mg by mouth 3 (three) times daily. 09/24/17   [provider]  fluticasone-salmeterol (ADVAIR HFA) 115-21 MCG/ACT inhaler Inhale 2 puffs into the lungs 2 (two) times daily.    [provider]  folic acid (FOLVITE) 1 MG tablet Take 1 mg by mouth daily. 09/23/17   [provider]  furosemide (LASIX) 20 MG tablet Take 1 tablet by mouth the next 4 days then daily as needed for swelling Patient not taking: Reported on 10/15/2018 10/06/18   Fenton, Clint R, PA  magnesium hydroxide (MILK OF MAGNESIA) 400 MG/5ML suspension Take 30 mLs by mouth daily as needed for mild constipation.    [provider]  metoprolol succinate (TOPROL XL) 25 MG 24 hr tablet Take 1 tablet (25 mg total) by mouth at bedtime. 10/06/18 10/06/19  Fenton, Clint R, PA  potassium chloride SA (K-DUR,KLOR-CON) 20 MEQ tablet Take 1 tablet (20 mEq total) by mouth daily. 01/13/18   Sherran Needs, NP  promethazine (PHENERGAN) 25 MG tablet Take 25 mg by mouth every 6 (six) hours as needed for nausea or vomiting.    [provider]    Family History Family History  Problem Relation Age of Onset  . Heart attack Mother   . Heart disease Mother   . Heart disease Sister   . Heart disease Sister   . Heart disease Sister   . Heart disease Sister   . Heart disease Sister     Social History Social History   Tobacco Use  . Smoking status: Never Smoker  . Smokeless tobacco: Never Used  Substance Use Topics  . Alcohol use: Never    Frequency: Never  . Drug use: Never     Allergies   Meperidine and Prednisone   Review of Systems Review of Systems  Constitutional: Negative for chills and fever.  HENT: Negative for congestion and rhinorrhea.   Eyes: Negative for redness and  visual disturbance.  Respiratory: Positive for shortness of breath. Negative for wheezing.   Cardiovascular: Positive for palpitations. Negative for chest pain.  Gastrointestinal: Negative for nausea and vomiting.  Genitourinary: Negative for dysuria and urgency.  Musculoskeletal: Negative for arthralgias and myalgias.  Skin: Negative for pallor and wound.  Neurological: Negative for dizziness and headaches.     Physical Exam Updated Vital Signs BP 131/81   Pulse 68   Temp 98.9 F (37.2 C) (Oral)   Resp 17   Ht 5\' 8"  (1.727 m)   Wt 82.6 kg   SpO2 98%   BMI 27.67 kg/m   Physical Exam Vitals signs and nursing note reviewed.  Constitutional:      General: She is not in acute distress.  Appearance: She is well-developed. She is not diaphoretic.  HENT:     Head: Normocephalic and atraumatic.  Eyes:     Pupils: Pupils are equal, round, and reactive to light.  Neck:     Musculoskeletal: Normal range of motion and neck supple.  Cardiovascular:     Rate and Rhythm: Tachycardia present. Rhythm irregular.     Heart sounds: No murmur. No friction rub. No gallop.   Pulmonary:     Effort: Pulmonary effort is normal.     Breath sounds: No wheezing or rales.  Abdominal:     General: There is no distension.     Palpations: Abdomen is soft.     Tenderness: There is no abdominal tenderness.  Musculoskeletal:        General: No tenderness.  Skin:    General: Skin is warm and dry.  Neurological:     Mental Status: She is alert and oriented to person, place, and time.  Psychiatric:        Behavior: Behavior normal.      ED Treatments / Results  Labs (all labs ordered are listed, but only abnormal results are displayed) Labs Reviewed  URINALYSIS, ROUTINE W REFLEX MICROSCOPIC - Abnormal; Notable for the following components:      Result Value   Color, Urine STRAW (*)    Specific Gravity, Urine 1.003 (*)    Leukocytes,Ua TRACE (*)    Bacteria, UA RARE (*)    All other  components within normal limits  BASIC METABOLIC PANEL  CBC  MAGNESIUM    EKG EKG Interpretation  Date/Time:  Sunday October 25 2018 10:36:29 EDT Ventricular Rate:  145 PR Interval:    QRS Duration: 78 QT Interval:  302 QTC Calculation: 469 R Axis:   77 Text Interpretation:  Atrial fibrillation with rapid ventricular response Nonspecific ST abnormality , probably digitalis effect Abnormal ECG Since last tracing rate faster Otherwise no significant change Confirmed by Vita Currin ([id number]) on 10/25/2018 11:16:48 AM   EKG Interpretation  Date/Time:  Sunday October 25 2018 10:58:37 EDT Ventricular Rate:  78 PR Interval:    QRS Duration: 90 QT Interval:  362 QTC Calculation: 413 R Axis:   92 Text Interpretation:  Sinus rhythm Right axis deviation afib resolved Otherwise no significant change Confirmed by Karthik Whittinghill ([id number]) on 10/25/2018 11:17:46 AM        Radiology Dg Chest 2 View  Result Date: 10/25/2018 CLINICAL DATA:  Atrial fibrillation. EXAM: CHEST - 2 VIEW COMPARISON:  None. FINDINGS: The heart size and mediastinal contours are within normal limits. Both lungs are clear. The visualized skeletal structures are unremarkable. IMPRESSION: No active cardiopulmonary disease. Electronically Signed   By: David  Williams III M.D   On: 10/25/2018 11:18    Procedures Procedures (including critical care time)  Medications Ordered in ED Medications  sodium chloride flush (NS) 0.9 % injection 3 mL (3 mLs Intravenous Given 10/25/18 1131)     Initial Impression / Assessment and Plan / ED Course  I have reviewed the triage vital signs and the nursing notes.  Pertinent labs & imaging results that were available during my care of the patient were reviewed by me and considered in my medical decision making (see chart for details).        67  yo F with a chief complaints of atrial fibrillation with rapid ventricular response.  Patient has a history of the same.  She is on Tikosyn and Eliquis at  home.  I was discussing  with her at bedside about electrical cardioversion when she spontaneously converted.  Seems similar to a prior ED visit about a month ago. Patient spontaneously converted.  Observed in the ED for about an hour without any recurrence.  Lab work without concerning finding.  Discharge home.  CHA2DS2/VAS Stroke Risk Points  Current as of 2 hours ago     3 >= 2 Points: High Risk  1 - 1.99 Points: Medium Risk  0 Points: Low Risk    This is the only CHA2DS2/VAS Stroke Risk Points available for the past  year.: Last Change: N/A     Details    This score determines the patient's risk of having a stroke if the  patient has atrial fibrillation.       Points Metrics  0 Has Congestive Heart Failure:  No    Current as of 2 hours ago  0 Has Vascular Disease:  No    Current as of 2 hours ago  1 Has Hypertension:  Yes    Current as of 2 hours ago  1 Age:  13    Current as of 2 hours ago  0 Has Diabetes:  No    Current as of 2 hours ago  0 Had Stroke:  No  Had TIA:  No  Had thromboembolism:  No    Current as of 2 hours ago  1 Female:  Yes    Current as of 2 hours ago            3:42 PM:  I have discussed the diagnosis/risks/treatment options with the patient and family and believe the pt to be eligible for discharge home to follow-up with PCP. We also discussed returning to the ED immediately if new or worsening sx occur. We discussed the sx which are most concerning (e.g., sudden worsening pain, fever, inability to tolerate by mouth) that necessitate immediate return. Medications administered to the patient during their visit and any new prescriptions provided to the patient are listed below.  Medications given during this visit Medications  sodium chloride flush (NS) 0.9 % injection 3 mL (3 mLs Intravenous Given 10/25/18 1131)     The patient appears reasonably screen and/or stabilized for discharge and I doubt any other medical condition or other [facility] requiring  further screening, evaluation, or treatment in the ED at this time prior to discharge.    Final Clinical Impressions(s) / ED Diagnoses   Final diagnoses:  Atrial fibrillation with RVR [facility])    ED Discharge Orders    None       Deno Etienne, DO 10/25/18 [place], DO 10/25/18 1542

## 2018-10-25 NOTE — ED Triage Notes (Signed)
Pt.stated,Afib that started about a hour ago.

## 2018-10-25 NOTE — Discharge Instructions (Signed)
Follow up with your cardiologist  

## 2018-10-26 ENCOUNTER — Ambulatory Visit (INDEPENDENT_AMBULATORY_CARE_PROVIDER_SITE_OTHER): Payer: Medicare Other | Admitting: *Deleted

## 2018-10-26 DIAGNOSIS — I480 Paroxysmal atrial fibrillation: Secondary | ICD-10-CM | POA: Diagnosis not present

## 2018-10-27 LAB — CUP PACEART REMOTE DEVICE CHECK
Date Time Interrogation Session: 20200706201019
Implantable Pulse Generator Implant Date: 20191016

## 2018-10-29 ENCOUNTER — Other Ambulatory Visit (HOSPITAL_COMMUNITY): Payer: Self-pay | Admitting: Physician Assistant

## 2018-11-03 DIAGNOSIS — M25561 Pain in right knee: Secondary | ICD-10-CM | POA: Diagnosis not present

## 2018-11-03 DIAGNOSIS — M1711 Unilateral primary osteoarthritis, right knee: Secondary | ICD-10-CM | POA: Diagnosis not present

## 2018-11-05 ENCOUNTER — Other Ambulatory Visit (HOSPITAL_COMMUNITY): Payer: Self-pay | Admitting: Physician Assistant

## 2018-11-06 NOTE — Progress Notes (Signed)
Carelink Summary Report / Loop Recorder 

## 2018-11-21 ENCOUNTER — Other Ambulatory Visit (HOSPITAL_COMMUNITY): Payer: Self-pay | Admitting: Physician Assistant

## 2018-11-29 LAB — CUP PACEART REMOTE DEVICE CHECK
Date Time Interrogation Session: 20200808203725
Implantable Pulse Generator Implant Date: 20191016

## 2018-11-30 ENCOUNTER — Ambulatory Visit (INDEPENDENT_AMBULATORY_CARE_PROVIDER_SITE_OTHER): Payer: Medicare Other | Admitting: *Deleted

## 2018-11-30 DIAGNOSIS — I480 Paroxysmal atrial fibrillation: Secondary | ICD-10-CM | POA: Diagnosis not present

## 2018-12-04 ENCOUNTER — Telehealth: Payer: Self-pay

## 2018-12-04 NOTE — Telephone Encounter (Signed)
Spoke with pt regarding appt on 12/07/18. Pt stated she would get her son to help her get ready for her appt. Pt was advise to check vitals prior to appt. Pt questions were address.

## 2018-12-07 ENCOUNTER — Telehealth (INDEPENDENT_AMBULATORY_CARE_PROVIDER_SITE_OTHER): Payer: Medicare Other | Admitting: Internal Medicine

## 2018-12-07 ENCOUNTER — Encounter: Payer: Self-pay | Admitting: Internal Medicine

## 2018-12-07 VITALS — BP 126/69 | HR 94 | Ht 68.0 in | Wt 187.0 lb

## 2018-12-07 DIAGNOSIS — R002 Palpitations: Secondary | ICD-10-CM

## 2018-12-07 DIAGNOSIS — I480 Paroxysmal atrial fibrillation: Secondary | ICD-10-CM

## 2018-12-07 NOTE — Progress Notes (Signed)
Electrophysiology TeleHealth Note   Due to national recommendations of social distancing due to COVID 19, an audio/video telehealth visit is felt to be most appropriate for this patient at this time.  See MyChart message from today for the patient's consent to telehealth for [facility].   Date:  12/07/2018   ID:  Nicole [name], DOB 08-02-1950, MRN [id number]  Location: patient's home  Provider location:  [facility]  Evaluation Performed: Follow-up visit  PCP:  Harvie Junior, MD   Electrophysiologist:  Dr Rayann Heman  Chief Complaint:  palpitations  History of Present Illness:    Nicole [name] is a 68 y.o. female who presents via telehealth conferencing today.  Since last being seen in our clinic, the patient reports doing reasonably well.  She remains very unhappy with her AF burden.  She required ED visit for AF in June.  She has done reasonably well since that time.  She is very anxious.  Today, she denies symptoms of  chest pain, shortness of breath,  lower extremity edema, or syncope. She is considering knee surgery.    Past Medical History:  Diagnosis Date  . Anticoagulant long-term use 11/16/2014  . Anxiety 10/06/2017  . Arthritis 10/06/2017   knees bilateral  . Asthma 11/16/2014  . Esophageal reflux 11/16/2014  . Essential hypertension 08/15/2015   pt denies  . H/O: GI bleed 12/11/2016  . Hyperlipidemia 11/17/2014  . Migraine headache 11/16/2014  . Nausea 11/16/2014  . Pain in limb 11/16/2014  . Panic disorder 11/16/2014  . Paroxysmal atrial fibrillation ([place]) 11/17/2014  . Swelling of joint 11/16/2014  . Symptomatic anemia 11/24/2016  . Tendonitis 11/16/2014  . UTI (urinary tract infection) 12/19/2015    Past Surgical History:  Procedure Laterality Date  . ABDOMINAL HYSTERECTOMY    . CARDIOVERSION    . CESAREAN SECTION    . COLONOSCOPY    . LOOP RECORDER INSERTION N/A 02/04/2018   Procedure: LOOP RECORDER INSERTION;  Surgeon: [name], MD;  Location: [facility];  Service: Cardiovascular;  Laterality: N/A;  . ROTATOR CUFF REPAIR Left     Current Outpatient Medications  Medication Sig Dispense Refill  . acetaminophen (TYLENOL) 500 MG tablet Take 500 mg by mouth every 6 (six) hours as needed for mild pain or headache.    Marland Kitchen amLODipine (NORVASC) 5 MG tablet TAKE 1 TABLET BY MOUTH EVERY DAY (Patient taking differently: Take 5 mg by mouth daily. ) 90 tablet 1  . butalbital-acetaminophen-caffeine (ESGIC) 50-325-40 MG tablet Take 1 tablet by mouth every 8 (eight) hours as needed for headache.    . butorphanol (STADOL) 10 MG/ML nasal spray Place 1 spray into the nose every 4 (four) hours as needed for headache.    . clonazePAM (KLONOPIN) 1 MG tablet Take 1 mg by mouth 3 (three) times daily.     Marland Kitchen diltiazem (CARDIZEM) 30 MG tablet Cardizem 30mg  -- take 1 tablet every 4 hours AS NEEDED for AFIB heart rate >100 45 tablet 1  . dofetilide (TIKOSYN) 500 MCG capsule Take 1 capsule (500 mcg total) by mouth 2 (two) times daily. 14 capsule 0  . ELIQUIS 5 MG TABS tablet Take 1 tablet (5 mg total) by mouth 2 (two) times daily. 60 tablet 6  . ferrous sulfate 325 (65 FE) MG tablet Take 325 mg by mouth 3 (three) times daily.  5  . fluticasone-salmeterol (ADVAIR HFA) 115-21 MCG/ACT inhaler Inhale 2 puffs into the lungs 2 (two) times daily.    Marland Kitchen  folic acid (FOLVITE) 1 MG tablet Take 1 mg by mouth daily.  6  . furosemide (LASIX) 20 MG tablet TAKE 1 TABLET (20 MG TOTAL) BY MOUTH DAILY AS NEEDED FOR EDEMA. 30 tablet 1  . magnesium hydroxide (MILK OF MAGNESIA) 400 MG/5ML suspension Take 30 mLs by mouth daily as needed for mild constipation.    . metoprolol succinate (TOPROL-XL) 25 MG 24 hr tablet TAKE 1 TABLET BY MOUTH EVERYDAY AT BEDTIME 90 tablet 1  . potassium chloride SA (K-DUR,KLOR-CON) 20 MEQ tablet Take 1 tablet (20 mEq total) by mouth daily. 90 tablet 3  . pravastatin (PRAVACHOL) 40 MG tablet Take 1 tablet by mouth daily.    . promethazine (PHENERGAN) 25 MG  tablet Take 25 mg by mouth every 6 (six) hours as needed for nausea or vomiting.     No current facility-administered medications for this visit.     Allergies:   Meperidine and Prednisone   Social History:  The patient  reports that she has never smoked. She has never used smokeless tobacco. She reports that she does not drink alcohol or use drugs.   Family History:  The patient's family history includes Heart attack in her mother; Heart disease in her mother, sister, sister, sister, sister, and sister.   ROS:  Please see the history of present illness.   All other systems are personally reviewed and negative.    Exam:    Vital Signs:  BP 126/69   Pulse 94   Ht 5\' 8"  (1.727 m)   Wt 187 lb (84.8 kg)   BMI 28.43 kg/m   Well sounding and appearing, alert and conversant, regular work of breathing,  good skin color Eyes- anicteric, neuro- grossly intact, skin- no apparent rash or lesions or cyanosis, mouth- oral mucosa is pink  Labs/Other Tests and Data Reviewed:    Recent Labs: 09/29/2018: B Natriuretic Peptide 136.8 10/25/2018: BUN 15; Creatinine, Ser 0.82; Hemoglobin 13.8; Magnesium 2.1; Platelets 238; Potassium 5.1; Sodium 138   Wt Readings from Last 3 Encounters:  12/07/18 187 lb (84.8 kg)  10/25/18 182 lb (82.6 kg)  10/15/18 188 lb 6.4 oz (85.5 kg)     Last device remote is reviewed from [place] PDF which reveals normal device function, afib burden is 0.3%   ASSESSMENT & PLAN:    1.  afib Improved with tikosyn chads2vasc score is 3.  She is on eliquis Her AF burden is very low (0.3%).  I have tried to reassure her today.  She can take prn diltiazem with AF with RVR events. I have advised that she consider following up with Dr Ola Spurr who did her prior ablation as he may feel that repeat ablation would be beneficial.  I have told her that I am not convinced that she will find much benefit as her current AF burden is so low.  2. Syncope Resolved ILR reviewed and does  not reveal any significant brady events.  3. Anemia Followed by hematology previously  4. HTN Stable No change required today   Follow-up:  AF clinic in 3 months   Patient Risk:  after full review of this patients clinical status, I feel that they are at moderate risk at this time.  Today, I have spent 15 minutes with the patient with telehealth technology discussing arrhythmia management .    Army Fossa, MD  12/07/2018 9:01 AM     [facility] [address] [place] [address] [phone] (office) [phone] (fax)

## 2018-12-08 NOTE — Progress Notes (Signed)
Carelink Summary Report / Loop Recorder 

## 2018-12-11 ENCOUNTER — Other Ambulatory Visit: Payer: Self-pay | Admitting: Cardiovascular Disease

## 2018-12-11 ENCOUNTER — Other Ambulatory Visit (HOSPITAL_COMMUNITY): Payer: Self-pay | Admitting: *Deleted

## 2018-12-11 MED ORDER — POTASSIUM CHLORIDE CRYS ER 20 MEQ PO TBCR: 20.0000 meq | EXTENDED_RELEASE_TABLET | Freq: Every day | ORAL | 3 refills | Status: DC

## 2018-12-14 ENCOUNTER — Other Ambulatory Visit (HOSPITAL_COMMUNITY): Payer: Self-pay | Admitting: *Deleted

## 2018-12-14 MED ORDER — POTASSIUM CHLORIDE CRYS ER 20 MEQ PO TBCR: 20.0000 meq | EXTENDED_RELEASE_TABLET | Freq: Every day | ORAL | 3 refills | Status: DC

## 2018-12-17 DIAGNOSIS — L570 Actinic keratosis: Secondary | ICD-10-CM | POA: Diagnosis not present

## 2018-12-17 DIAGNOSIS — L820 Inflamed seborrheic keratosis: Secondary | ICD-10-CM | POA: Diagnosis not present

## 2018-12-22 ENCOUNTER — Other Ambulatory Visit (HOSPITAL_COMMUNITY): Payer: Self-pay | Admitting: Nurse Practitioner

## 2018-12-24 DIAGNOSIS — Z23 Encounter for immunization: Secondary | ICD-10-CM | POA: Diagnosis not present

## 2018-12-30 ENCOUNTER — Telehealth (HOSPITAL_COMMUNITY): Payer: Self-pay | Admitting: *Deleted

## 2018-12-30 NOTE — Telephone Encounter (Signed)
Pt states she went into AF this morning has taken a dose of cardizem 30mg  will take another this afternoon since it still is out. She received a shingles shot yesterday. Pt will call if AF does not convert.

## 2018-12-31 ENCOUNTER — Ambulatory Visit (INDEPENDENT_AMBULATORY_CARE_PROVIDER_SITE_OTHER): Payer: Medicare Other | Admitting: *Deleted

## 2018-12-31 DIAGNOSIS — I482 Chronic atrial fibrillation, unspecified: Secondary | ICD-10-CM | POA: Diagnosis not present

## 2018-12-31 LAB — CUP PACEART REMOTE DEVICE CHECK
Date Time Interrogation Session: 20200910080401
Implantable Pulse Generator Implant Date: 20191016

## 2019-01-11 NOTE — Progress Notes (Signed)
Carelink Summary Report / Loop Recorder 

## 2019-01-15 ENCOUNTER — Other Ambulatory Visit (HOSPITAL_COMMUNITY): Payer: Self-pay | Admitting: Nurse Practitioner

## 2019-01-20 ENCOUNTER — Telehealth: Payer: Self-pay

## 2019-01-20 NOTE — Telephone Encounter (Signed)
    [facility] Pre-operative Risk Assessment    Request for surgical clearance:  1. What type of surgery is being performed? RIGHT TOTAL KNEE REPLACEMENT   2. When is this surgery scheduled? 03/02/21   3. What type of clearance is required (medical clearance vs. Pharmacy clearance to hold med vs. Both)? MEDICAL  4. Are there any medications that need to be held prior to surgery and how long? ELIQUIS    5. Practice name and name of physician performing surgery? [facility], DR LENNON  6. What is your office phone number [phone]    7.   What is your office fax number [phone]  8.   Anesthesia type (None, local, MAC, general) ? GENERAL   Nicole [name] 01/20/2019, 4:44 PM  _________________________________________________________________   (provider comments below)

## 2019-01-20 NOTE — Telephone Encounter (Signed)
Please comment on eliquis. 

## 2019-01-21 NOTE — Telephone Encounter (Signed)
   Primary Cardiologist: [name], MD  Chart reviewed as part of pre-operative protocol coverage. Patient was contacted 01/21/2019 in reference to pre-operative risk assessment for pending surgery as outlined below.  Nicole [name] was last seen on 12/07/18 by Dr. Rayann Heman.  Since that day, Nicole [name] has done well. She does not have a history of MI or stroke. She denies anginal symptoms. Her mobility is limited by her knee pain, but it sounds as though she can complete 4.0 METS - states she can walk a half a mile.  Per our clinical pharmacist: Patient with diagnosis of afib on Eliquis for anticoagulation.    Procedure: RIGHT TOTAL KNEE REPLACEMENT Date of procedure: 03/03/2019  CHADS2-VASc score of  3 (HTN, AGE, female)  CrCl 74 ml/min  Per office protocol, patient can hold Eliquis for 3 days prior to procedure  Therefore, based on ACC/AHA guidelines, the patient would be at acceptable risk for the planned procedure without further cardiovascular testing.   I will route this recommendation to the requesting party via Epic fax function and remove from pre-op pool.  Please call with questions.  [place], PA 01/21/2019, 2:53 PM

## 2019-01-21 NOTE — Telephone Encounter (Signed)
Patient with diagnosis of afib on Eliquis for anticoagulation.    Procedure: RIGHT TOTAL KNEE REPLACEMENT  Date of procedure: 03/03/2019  CHADS2-VASc score of  3 (HTN, AGE, female)  CrCl 74 ml/min  Per office protocol, patient can hold Eliquis for 3 days prior to procedure.

## 2019-02-02 ENCOUNTER — Ambulatory Visit (INDEPENDENT_AMBULATORY_CARE_PROVIDER_SITE_OTHER): Payer: Medicare Other | Admitting: *Deleted

## 2019-02-02 DIAGNOSIS — I482 Chronic atrial fibrillation, unspecified: Secondary | ICD-10-CM

## 2019-02-03 LAB — CUP PACEART REMOTE DEVICE CHECK
Date Time Interrogation Session: 20201013214359
Implantable Pulse Generator Implant Date: 20191016

## 2019-02-07 ENCOUNTER — Other Ambulatory Visit (HOSPITAL_COMMUNITY): Payer: Self-pay | Admitting: Nurse Practitioner

## 2019-02-15 NOTE — Progress Notes (Signed)
Carelink Summary Report / Loop Recorder 

## 2019-03-06 ENCOUNTER — Other Ambulatory Visit (HOSPITAL_COMMUNITY): Payer: Self-pay | Admitting: Physician Assistant

## 2019-03-07 LAB — CUP PACEART REMOTE DEVICE CHECK
Date Time Interrogation Session: 20201115194620
Implantable Pulse Generator Implant Date: 20191016

## 2019-03-08 ENCOUNTER — Other Ambulatory Visit: Payer: Self-pay

## 2019-03-08 ENCOUNTER — Ambulatory Visit (INDEPENDENT_AMBULATORY_CARE_PROVIDER_SITE_OTHER): Payer: Medicare Other | Admitting: *Deleted

## 2019-03-08 ENCOUNTER — Ambulatory Visit (HOSPITAL_COMMUNITY)
Admission: RE | Admit: 2019-03-08 | Discharge: 2019-03-08 | Disposition: A | Discharge status: Home / Self Care | Payer: Medicare Other | Source: Ambulatory Visit | Attending: Physician Assistant | Admitting: Physician Assistant

## 2019-03-08 ENCOUNTER — Encounter (HOSPITAL_COMMUNITY): Payer: Self-pay | Admitting: Physician Assistant

## 2019-03-08 ENCOUNTER — Other Ambulatory Visit: Payer: Self-pay | Admitting: Internal Medicine

## 2019-03-08 VITALS — BP 134/80 | HR 74 | Ht 68.0 in | Wt 188.0 lb

## 2019-03-08 DIAGNOSIS — I480 Paroxysmal atrial fibrillation: Secondary | ICD-10-CM | POA: Diagnosis not present

## 2019-03-08 DIAGNOSIS — D649 Anemia, unspecified: Secondary | ICD-10-CM | POA: Insufficient documentation

## 2019-03-08 DIAGNOSIS — M13861 Other specified arthritis, right knee: Secondary | ICD-10-CM | POA: Diagnosis not present

## 2019-03-08 DIAGNOSIS — R55 Syncope and collapse: Secondary | ICD-10-CM

## 2019-03-08 DIAGNOSIS — Z7901 Long term (current) use of anticoagulants: Secondary | ICD-10-CM | POA: Insufficient documentation

## 2019-03-08 DIAGNOSIS — F419 Anxiety disorder, unspecified: Secondary | ICD-10-CM | POA: Diagnosis not present

## 2019-03-08 DIAGNOSIS — Z8249 Family history of ischemic heart disease and other diseases of the circulatory system: Secondary | ICD-10-CM | POA: Insufficient documentation

## 2019-03-08 DIAGNOSIS — K219 Gastro-esophageal reflux disease without esophagitis: Secondary | ICD-10-CM | POA: Insufficient documentation

## 2019-03-08 DIAGNOSIS — D6869 Other thrombophilia: Secondary | ICD-10-CM | POA: Diagnosis not present

## 2019-03-08 DIAGNOSIS — I10 Essential (primary) hypertension: Secondary | ICD-10-CM | POA: Diagnosis not present

## 2019-03-08 DIAGNOSIS — Z79899 Other long term (current) drug therapy: Secondary | ICD-10-CM | POA: Insufficient documentation

## 2019-03-08 DIAGNOSIS — M13862 Other specified arthritis, left knee: Secondary | ICD-10-CM | POA: Diagnosis not present

## 2019-03-08 DIAGNOSIS — I482 Chronic atrial fibrillation, unspecified: Secondary | ICD-10-CM | POA: Diagnosis not present

## 2019-03-08 DIAGNOSIS — E785 Hyperlipidemia, unspecified: Secondary | ICD-10-CM | POA: Insufficient documentation

## 2019-03-08 LAB — BASIC METABOLIC PANEL
Anion gap: 9 (ref 5–15)
BUN: 10 mg/dL (ref 8–23)
CO2: 23 mmol/L (ref 22–32)
Calcium: 9.3 mg/dL (ref 8.9–10.3)
Chloride: 106 mmol/L (ref 98–111)
Creatinine, Ser: 0.68 mg/dL (ref 0.44–1.00)
GFR calc Af Amer: >60 mL/min (ref >60)
GFR calc non Af Amer: >60 mL/min (ref >60)
Glucose, Bld: 110 mg/dL — ABNORMAL HIGH (ref 70–99)
Potassium: 5.2 mmol/L — ABNORMAL HIGH (ref 3.5–5.1)
Sodium: 138 mmol/L (ref 135–145)

## 2019-03-08 LAB — MAGNESIUM: Magnesium: 2.4 mg/dL (ref 1.7–2.4)

## 2019-03-08 NOTE — Progress Notes (Addendum)
Primary Care Physician: Harvie Junior, MD Referring Physician: Dr. [name] Henandez is a 68 y.o. female with a h/o anemia,  afib, and syncope possibly 2/2 termination pause  that presented to his office with afib with RVR at 160 bpm and was admitted for Tikosyn load. Converted to SR without pause. Qtc remained stable. She feels improved, no further issues with afib since tikosyn load. F/u AF clinic 10/06/18. Patient reports that she has done well since her last visit until 09/29/18 when she developed palpitations, chest pressure, and SOB. She presented to the ER and was found to be in afib with RVR but converted spontaneously prior to DCCV. She is on Eliquis for a CHADS2VASC score of 3.  On follow up today, patient reports that she has done well since her last visit. She did have one episode of afib after getting her shingles vaccine which lasted a few hours but resolved with PRN CCB. She denies any other heart racing or palpitations. She denies any bleeding issues. She is scheduled to have knee replacement surgery in January.  Today, she denies symptoms of palpitations, chest pain, shortness of breath, orthopnea, PND, dizziness, presyncope, syncope, or neurologic sequela. The patient is tolerating medications without difficulties and is otherwise without complaint today.   Past Medical History:  Diagnosis Date  . Anticoagulant long-term use 11/16/2014  . Anxiety 10/06/2017  . Arthritis 10/06/2017   knees bilateral  . Asthma 11/16/2014  . Esophageal reflux 11/16/2014  . Essential hypertension 08/15/2015   pt denies  . H/O: GI bleed 12/11/2016  . Hyperlipidemia 11/17/2014  . Migraine headache 11/16/2014  . Nausea 11/16/2014  . Pain in limb 11/16/2014  . Panic disorder 11/16/2014  . Paroxysmal atrial fibrillation ([place]) 11/17/2014  . Swelling of joint 11/16/2014  . Symptomatic anemia 11/24/2016  . Tendonitis 11/16/2014  . UTI (urinary tract infection) 12/19/2015   Past Surgical History:   Procedure Laterality Date  . ABDOMINAL HYSTERECTOMY    . CARDIOVERSION    . CESAREAN SECTION    . COLONOSCOPY    . LOOP RECORDER INSERTION N/A 02/04/2018   Procedure: LOOP RECORDER INSERTION;  Surgeon: [name], MD;  Location: [facility];  Service: Cardiovascular;  Laterality: N/A;  . ROTATOR CUFF REPAIR Left     Current Outpatient Medications  Medication Sig Dispense Refill  . acetaminophen (TYLENOL) 500 MG tablet Take 500 mg by mouth every 6 (six) hours as needed for mild pain or headache.    Marland Kitchen amLODipine (NORVASC) 5 MG tablet TAKE 1 TABLET BY MOUTH EVERY DAY (Patient taking differently: Take 5 mg by mouth daily. ) 90 tablet 1  . butalbital-acetaminophen-caffeine (ESGIC) 50-325-40 MG tablet Take 1 tablet by mouth every 8 (eight) hours as needed for headache.    . butorphanol (STADOL) 10 MG/ML nasal spray Place 1 spray into the nose every 4 (four) hours as needed for headache.    . clonazePAM (KLONOPIN) 1 MG tablet Take 1 mg by mouth 3 (three) times daily.     Marland Kitchen diltiazem (CARDIZEM) 30 MG tablet Cardizem 30mg  -- take 1 tablet every 4 hours AS NEEDED for AFIB heart rate >100 45 tablet 1  . dofetilide (TIKOSYN) 500 MCG capsule TAKE 1 CAPSULE BY MOUTH TWICE A DAY 180 capsule 3  . ELIQUIS 5 MG TABS tablet Take 1 tablet (5 mg total) by mouth 2 (two) times daily. 60 tablet 6  . ferrous sulfate 325 (65 FE) MG tablet Take 325 mg by mouth  3 (three) times daily.  5  . fluticasone-salmeterol (ADVAIR HFA) 115-21 MCG/ACT inhaler Inhale 2 puffs into the lungs 2 (two) times daily.    . folic acid (FOLVITE) 1 MG tablet Take 1 mg by mouth daily.  6  . furosemide (LASIX) 20 MG tablet TAKE 1 TABLET BY MOUTH EVERY DAY AS NEEDED FOR EDEMA 30 tablet 1  . magnesium hydroxide (MILK OF MAGNESIA) 400 MG/5ML suspension Take 30 mLs by mouth daily as needed for mild constipation.    . metoprolol succinate (TOPROL-XL) 25 MG 24 hr tablet TAKE 1 TABLET BY MOUTH EVERYDAY AT BEDTIME 90 tablet 1  . potassium  chloride SA (K-DUR) 20 MEQ tablet Take 1 tablet (20 mEq total) by mouth daily. 90 tablet 3  . pravastatin (PRAVACHOL) 40 MG tablet Take 1 tablet by mouth daily.    . promethazine (PHENERGAN) 25 MG tablet Take 25 mg by mouth every 6 (six) hours as needed for nausea or vomiting.     No current facility-administered medications for this encounter.     Allergies  Allergen Reactions  . Meperidine Nausea And Vomiting    States she "About passed out"  . Prednisone Palpitations    Tachycardia     Social History   Socioeconomic History  . Marital status: Married    Spouse name: Not on file  . Number of children: Not on file  . Years of education: Not on file  . Highest education level: Not on file  Occupational History  . Not on file  Social Needs  . Financial resource strain: Not on file  . Food insecurity    Worry: Not on file    Inability: Not on file  . Transportation needs    Medical: Not on file    Non-medical: Not on file  Tobacco Use  . Smoking status: Never Smoker  . Smokeless tobacco: Never Used  Substance and Sexual Activity  . Alcohol use: Never    Frequency: Never  . Drug use: Never  . Sexual activity: Not on file  Lifestyle  . Physical activity    Days per week: Not on file    Minutes per session: Not on file  . Stress: Not on file  Relationships  . Social Herbalist on phone: Not on file    Gets together: Not on file    Attends religious service: Not on file    Active member of club or organization: Not on file    Attends meetings of clubs or organizations: Not on file    Relationship status: Not on file  . Intimate partner violence    Fear of current or ex partner: Not on file    Emotionally abused: Not on file    Physically abused: Not on file    Forced sexual activity: Not on file  Other Topics Concern  . Not on file  Social History Narrative   Lives in [place] with spouse   Retired from school system.    Family History  Problem  Relation Age of Onset  . Heart attack Mother   . Heart disease Mother   . Heart disease Sister   . Heart disease Sister   . Heart disease Sister   . Heart disease Sister   . Heart disease Sister     ROS- All systems are reviewed and negative except as per the HPI above  Physical Exam: There were no vitals filed for this visit. Wt Readings from Last 3 Encounters:  12/07/18 187 lb (84.8 kg)  10/25/18 182 lb (82.6 kg)  10/15/18 188 lb 6.4 oz (85.5 kg)    Labs: Lab Results  Component Value Date   NA 138 10/25/2018   K 5.1 10/25/2018   CL 107 10/25/2018   CO2 23 10/25/2018   GLUCOSE 93 10/25/2018   BUN 15 10/25/2018   CREATININE 0.82 10/25/2018   CALCIUM 9.8 10/25/2018   MG 2.1 10/25/2018   No results found for: INR No results found for: CHOL, HDL, LDLCALC, TRIG  GEN- The patient is well appearing, alert and oriented x 3 today.   HEENT-head normocephalic, atraumatic, sclera clear, conjunctiva pink, hearing intact, trachea midline. Lungs- Clear to ausculation bilaterally, normal work of breathing Heart- Regular rate and rhythm, no murmurs, rubs or gallops  GI- soft, NT, ND, + BS Extremities- no clubbing, cyanosis, or edema MS- no significant deformity or atrophy Skin- no rash or lesion Psych- euthymic mood, full affect Neuro- strength and sensation are intact   EKG- SR HR 74, PR 194, QRS 86, QTc 463  Epic records reviewed   Assessment and Plan: 1. Paroxysmal atrial fibrillation   No new AF episodes on ILR. Burden 0.1%. Continue dofetilide 500 mcg bid, QT stable. Will continue Toprol 25 mg daily.  Continue diltiazem 30 mg PRN q 4hrs for heart racing. Continue Eliquis 5 mg BID Check Bmet/mag today.  This patients CHA2DS2-VASc Score and unadjusted Ischemic Stroke Rate (% per year) is equal to 3.2 % stroke rate/year from a score of 3  Above score calculated as 1 point each if present [CHF, HTN, DM, Vascular=MI/PAD/Aortic Plaque, Age if 65-74, or Female] Above  score calculated as 2 points each if present [Age > 75, or Stroke/TIA/TE]  2. HTN Stable, no changes today.   Follow up in the AF clinic in 4 months.   [facility] [address] [place], [place] [address] [phone]

## 2019-03-08 NOTE — Telephone Encounter (Signed)
Age 68, weight 85kg, SCr 0.82 on 10/25/18, afib indication, seen in afib clinic today

## 2019-03-12 ENCOUNTER — Encounter (HOSPITAL_COMMUNITY): Payer: Self-pay

## 2019-03-12 ENCOUNTER — Encounter (HOSPITAL_COMMUNITY): Payer: Self-pay | Admitting: *Deleted

## 2019-03-24 ENCOUNTER — Other Ambulatory Visit (HOSPITAL_COMMUNITY): Payer: Self-pay | Admitting: *Deleted

## 2019-03-24 DIAGNOSIS — I4819 Other persistent atrial fibrillation: Secondary | ICD-10-CM

## 2019-04-05 ENCOUNTER — Other Ambulatory Visit (HOSPITAL_COMMUNITY): Payer: Self-pay | Admitting: *Deleted

## 2019-04-05 MED ORDER — FUROSEMIDE 20 MG PO TABS: ORAL_TABLET | ORAL | 2 refills | Status: DC

## 2019-04-05 NOTE — Progress Notes (Signed)
Carelink Summary Report / Loop Recorder 

## 2019-04-09 ENCOUNTER — Ambulatory Visit (INDEPENDENT_AMBULATORY_CARE_PROVIDER_SITE_OTHER): Payer: Medicare Other | Admitting: *Deleted

## 2019-04-09 DIAGNOSIS — I482 Chronic atrial fibrillation, unspecified: Secondary | ICD-10-CM

## 2019-04-10 LAB — CUP PACEART REMOTE DEVICE CHECK
Date Time Interrogation Session: 20201218164701
Implantable Pulse Generator Implant Date: 20191016

## 2019-05-12 ENCOUNTER — Ambulatory Visit (INDEPENDENT_AMBULATORY_CARE_PROVIDER_SITE_OTHER): Payer: Medicare Other | Admitting: *Deleted

## 2019-05-12 DIAGNOSIS — I482 Chronic atrial fibrillation, unspecified: Secondary | ICD-10-CM | POA: Diagnosis not present

## 2019-05-13 LAB — CUP PACEART REMOTE DEVICE CHECK
Date Time Interrogation Session: 20210120164806
Implantable Pulse Generator Implant Date: 20191016

## 2019-06-14 ENCOUNTER — Ambulatory Visit (INDEPENDENT_AMBULATORY_CARE_PROVIDER_SITE_OTHER): Payer: Medicare Other | Admitting: *Deleted

## 2019-06-14 DIAGNOSIS — I482 Chronic atrial fibrillation, unspecified: Secondary | ICD-10-CM | POA: Diagnosis not present

## 2019-06-14 LAB — CUP PACEART REMOTE DEVICE CHECK
Date Time Interrogation Session: 20210222004421
Implantable Pulse Generator Implant Date: 20191016

## 2019-06-15 NOTE — Progress Notes (Signed)
ILR Remote 

## 2019-06-16 ENCOUNTER — Other Ambulatory Visit (HOSPITAL_COMMUNITY): Payer: Self-pay | Admitting: Physician Assistant

## 2019-06-18 DIAGNOSIS — Z1231 Encounter for screening mammogram for malignant neoplasm of breast: Secondary | ICD-10-CM | POA: Diagnosis not present

## 2019-07-05 NOTE — Progress Notes (Signed)
Primary Care Physician: Harvie Junior, MD Referring Physician: Dr. [name] Damer is a 69 y.o. female with a h/o anemia,  afib, and syncope possibly 2/2 termination pause  that presented to his office with afib with RVR at 160 bpm and was admitted for Tikosyn load. Converted to SR without pause. Qtc remained stable. She feels improved, no further issues with afib since tikosyn load. F/u AF clinic 10/06/18. Patient reports that she has done well since her last visit until 09/29/18 when she developed palpitations, chest pressure, and SOB. She presented to the ER and was found to be in afib with RVR but converted spontaneously prior to DCCV. She is on Eliquis for a CHADS2VASC score of 3.  On follow up today, patient reports that she has done well. Afib burden on ILR 0.4%. She took a PRN diltiazem which resolved her palpitations. She also has a Kardia device for self monitoring. She is tolerating the medication without difficulty.   Today, she denies symptoms of palpitations, chest pain, shortness of breath, orthopnea, PND, dizziness, presyncope, syncope, or neurologic sequela. The patient is tolerating medications without difficulties and is otherwise without complaint today.   Past Medical History:  Diagnosis Date  . Anticoagulant long-term use 11/16/2014  . Anxiety 10/06/2017  . Arthritis 10/06/2017   knees bilateral  . Asthma 11/16/2014  . Esophageal reflux 11/16/2014  . Essential hypertension 08/15/2015   pt denies  . H/O: GI bleed 12/11/2016  . Hyperlipidemia 11/17/2014  . Migraine headache 11/16/2014  . Nausea 11/16/2014  . Pain in limb 11/16/2014  . Panic disorder 11/16/2014  . Paroxysmal atrial fibrillation ([place]) 11/17/2014  . Swelling of joint 11/16/2014  . Symptomatic anemia 11/24/2016  . Tendonitis 11/16/2014  . UTI (urinary tract infection) 12/19/2015   Past Surgical History:  Procedure Laterality Date  . ABDOMINAL HYSTERECTOMY    . CARDIOVERSION    . CESAREAN SECTION    .  COLONOSCOPY    . LOOP RECORDER INSERTION N/A 02/04/2018   Procedure: LOOP RECORDER INSERTION;  Surgeon: [name], MD;  Location: [facility];  Service: Cardiovascular;  Laterality: N/A;  . ROTATOR CUFF REPAIR Left     Current Outpatient Medications  Medication Sig Dispense Refill  . acetaminophen (TYLENOL) 500 MG tablet Take 500 mg by mouth every 6 (six) hours as needed for mild pain or headache.    Marland Kitchen amLODipine (NORVASC) 5 MG tablet TAKE 1 TABLET BY MOUTH EVERY DAY (Patient taking differently: Take 5 mg by mouth daily. ) 90 tablet 1  . butalbital-acetaminophen-caffeine (ESGIC) 50-325-40 MG tablet Take 1 tablet by mouth every 8 (eight) hours as needed for headache.    . butorphanol (STADOL) 10 MG/ML nasal spray Place 1 spray into the nose every 4 (four) hours as needed for headache.    . clonazePAM (KLONOPIN) 1 MG tablet Take 1 mg by mouth 3 (three) times daily.     Marland Kitchen diltiazem (CARDIZEM) 30 MG tablet Cardizem 30mg  -- take 1 tablet every 4 hours AS NEEDED for AFIB heart rate >100 45 tablet 1  . dofetilide (TIKOSYN) 500 MCG capsule TAKE 1 CAPSULE BY MOUTH TWICE A DAY 180 capsule 3  . ELIQUIS 5 MG TABS tablet TAKE 1 TABLET BY MOUTH TWICE A DAY 60 tablet 6  . fluticasone-salmeterol (ADVAIR HFA) 115-21 MCG/ACT inhaler Inhale 2 puffs into the lungs 2 (two) times daily.    . furosemide (LASIX) 20 MG tablet TAKE 1 TABLET BY MOUTH EVERY DAY AS NEEDED  FOR EDEMA 30 tablet 2  . magnesium hydroxide (MILK OF MAGNESIA) 400 MG/5ML suspension Take 30 mLs by mouth daily as needed for mild constipation.    . metoprolol succinate (TOPROL-XL) 25 MG 24 hr tablet TAKE 1 TABLET BY MOUTH EVERYDAY AT BEDTIME 90 tablet 1  . potassium chloride SA (K-DUR) 20 MEQ tablet Take 1 tablet (20 mEq total) by mouth daily. 90 tablet 3  . pravastatin (PRAVACHOL) 40 MG tablet Take 1 tablet by mouth daily.    . promethazine (PHENERGAN) 25 MG tablet Take 25 mg by mouth every 6 (six) hours as needed for nausea or vomiting.      No current facility-administered medications for this visit.    Allergies  Allergen Reactions  . Meperidine Nausea And Vomiting    States she "About passed out"  . Prednisone Palpitations    Tachycardia     Social History   Socioeconomic History  . Marital status: Married    Spouse name: Not on file  . Number of children: Not on file  . Years of education: Not on file  . Highest education level: Not on file  Occupational History  . Not on file  Tobacco Use  . Smoking status: Never Smoker  . Smokeless tobacco: Never Used  Substance and Sexual Activity  . Alcohol use: Never  . Drug use: Never  . Sexual activity: Not on file  Other Topics Concern  . Not on file  Social History Narrative   Lives in [place] with spouse   Retired from school system.   Social Determinants of Health   Financial Resource Strain:   . Difficulty of Paying Living Expenses:   Food Insecurity:   . Worried About Charity fundraiser in the Last Year:   . Arboriculturist in the Last Year:   Transportation Needs:   . Film/video editor (Medical):   Marland Kitchen Lack of Transportation (Non-Medical):   Physical Activity:   . Days of Exercise per Week:   . Minutes of Exercise per Session:   Stress:   . Feeling of Stress :   Social Connections:   . Frequency of Communication with Friends and Family:   . Frequency of Social Gatherings with Friends and Family:   . Attends Religious Services:   . Active Member of Clubs or Organizations:   . Attends Archivist Meetings:   Marland Kitchen Marital Status:   Intimate Partner Violence:   . Fear of Current or Ex-Partner:   . Emotionally Abused:   Marland Kitchen Physically Abused:   . Sexually Abused:     Family History  Problem Relation Age of Onset  . Heart attack Mother   . Heart disease Mother   . Heart disease Sister   . Heart disease Sister   . Heart disease Sister   . Heart disease Sister   . Heart disease Sister     ROS- All systems are reviewed and  negative except as per the HPI above  Physical Exam: There were no vitals filed for this visit. Wt Readings from Last 3 Encounters:  03/08/19 188 lb (85.3 kg)  12/07/18 187 lb (84.8 kg)  10/25/18 182 lb (82.6 kg)    Labs: Lab Results  Component Value Date   NA 138 03/08/2019   K 5.2 (H) 03/08/2019   CL 106 03/08/2019   CO2 23 03/08/2019   GLUCOSE 110 (H) 03/08/2019   BUN 10 03/08/2019   CREATININE 0.68 03/08/2019   CALCIUM 9.3 03/08/2019  MG 2.4 03/08/2019   No results found for: INR No results found for: CHOL, HDL, LDLCALC, TRIG  GEN- The patient is well appearing, alert and oriented x 3 today.   HEENT-head normocephalic, atraumatic, sclera clear, conjunctiva pink, hearing intact, trachea midline. Lungs- Clear to ausculation bilaterally, normal work of breathing Heart- Regular rate and rhythm, no murmurs, rubs or gallops  GI- soft, NT, ND, + BS Extremities- no clubbing, cyanosis. Non pitting edema MS- no significant deformity or atrophy Skin- no rash or lesion Psych- euthymic mood, full affect Neuro- strength and sensation are intact   EKG- SR HR 72, PR 194, QRS 82, QTc 435  Epic records reviewed   Assessment and Plan: 1. Paroxysmal atrial fibrillation   Two afib episodes with controlled V rates, burden 0.4% Patient is happy with her current therapy.  Continue dofetilide 500 mcg BID. QT stable. Continue ontinue Toprol 25 mg daily.  Continue diltiazem 30 mg PRN q 4hrs for heart racing. Continue Eliquis 5 mg BID Check bmet/mag today.  This patients CHA2DS2-VASc Score and unadjusted Ischemic Stroke Rate (% per year) is equal to 3.2 % stroke rate/year from a score of 3  Above score calculated as 1 point each if present [CHF, HTN, DM, Vascular=MI/PAD/Aortic Plaque, Age if 65-74, or Female] Above score calculated as 2 points each if present [Age > 75, or Stroke/TIA/TE]  2. HTN Stable, no changes today.   Follow up in the AF clinic in 4 months.    [facility] [address] [place], [place] [address] [phone]

## 2019-07-06 ENCOUNTER — Ambulatory Visit (HOSPITAL_COMMUNITY)
Admission: RE | Admit: 2019-07-06 | Discharge: 2019-07-06 | Disposition: A | Discharge status: Home / Self Care | Payer: Medicare Other | Source: Ambulatory Visit | Attending: Physician Assistant | Admitting: Physician Assistant

## 2019-07-06 ENCOUNTER — Encounter (HOSPITAL_COMMUNITY): Payer: Self-pay | Admitting: Physician Assistant

## 2019-07-06 ENCOUNTER — Other Ambulatory Visit: Payer: Self-pay

## 2019-07-06 VITALS — BP 130/76 | HR 72 | Ht 68.0 in | Wt 194.8 lb

## 2019-07-06 DIAGNOSIS — Z79899 Other long term (current) drug therapy: Secondary | ICD-10-CM | POA: Insufficient documentation

## 2019-07-06 DIAGNOSIS — I10 Essential (primary) hypertension: Secondary | ICD-10-CM | POA: Insufficient documentation

## 2019-07-06 DIAGNOSIS — Z7901 Long term (current) use of anticoagulants: Secondary | ICD-10-CM | POA: Diagnosis not present

## 2019-07-06 DIAGNOSIS — E785 Hyperlipidemia, unspecified: Secondary | ICD-10-CM | POA: Diagnosis not present

## 2019-07-06 DIAGNOSIS — I480 Paroxysmal atrial fibrillation: Secondary | ICD-10-CM

## 2019-07-06 DIAGNOSIS — D6869 Other thrombophilia: Secondary | ICD-10-CM | POA: Diagnosis not present

## 2019-07-06 DIAGNOSIS — D649 Anemia, unspecified: Secondary | ICD-10-CM | POA: Diagnosis not present

## 2019-07-06 LAB — BASIC METABOLIC PANEL
Anion gap: 9 (ref 5–15)
BUN: 13 mg/dL (ref 8–23)
CO2: 26 mmol/L (ref 22–32)
Calcium: 9.7 mg/dL (ref 8.9–10.3)
Chloride: 105 mmol/L (ref 98–111)
Creatinine, Ser: 0.84 mg/dL (ref 0.44–1.00)
GFR calc Af Amer: >60 mL/min (ref >60)
GFR calc non Af Amer: >60 mL/min (ref >60)
Glucose, Bld: 94 mg/dL (ref 70–99)
Potassium: 5.1 mmol/L (ref 3.5–5.1)
Sodium: 140 mmol/L (ref 135–145)

## 2019-07-06 LAB — MAGNESIUM: Magnesium: 2.2 mg/dL (ref 1.7–2.4)

## 2019-07-07 ENCOUNTER — Other Ambulatory Visit (HOSPITAL_COMMUNITY): Payer: Self-pay | Admitting: *Deleted

## 2019-07-07 MED ORDER — DILTIAZEM HCL 30 MG PO TABS: ORAL_TABLET | ORAL | 1 refills | Status: DC

## 2019-07-13 ENCOUNTER — Other Ambulatory Visit (HOSPITAL_COMMUNITY): Payer: Self-pay | Admitting: Nurse Practitioner

## 2019-07-15 ENCOUNTER — Ambulatory Visit (INDEPENDENT_AMBULATORY_CARE_PROVIDER_SITE_OTHER): Payer: Medicare Other | Admitting: *Deleted

## 2019-07-15 DIAGNOSIS — I482 Chronic atrial fibrillation, unspecified: Secondary | ICD-10-CM | POA: Diagnosis not present

## 2019-07-15 LAB — CUP PACEART REMOTE DEVICE CHECK
Date Time Interrogation Session: 20210325014453
Implantable Pulse Generator Implant Date: 20191016

## 2019-07-16 NOTE — Progress Notes (Signed)
ILR Remote 

## 2019-08-03 ENCOUNTER — Other Ambulatory Visit (HOSPITAL_COMMUNITY): Payer: Self-pay | Admitting: Physician Assistant

## 2019-08-16 LAB — CUP PACEART REMOTE DEVICE CHECK
Date Time Interrogation Session: 20210425014511
Implantable Pulse Generator Implant Date: 20191016

## 2019-08-17 ENCOUNTER — Ambulatory Visit (INDEPENDENT_AMBULATORY_CARE_PROVIDER_SITE_OTHER): Payer: Medicare Other | Admitting: *Deleted

## 2019-08-17 DIAGNOSIS — I482 Chronic atrial fibrillation, unspecified: Secondary | ICD-10-CM | POA: Diagnosis not present

## 2019-08-18 NOTE — Progress Notes (Signed)
ILR Remote 

## 2019-08-31 ENCOUNTER — Other Ambulatory Visit (HOSPITAL_COMMUNITY): Payer: Self-pay

## 2019-08-31 MED ORDER — DILTIAZEM HCL 30 MG PO TABS: ORAL_TABLET | ORAL | 1 refills | Status: DC

## 2019-09-02 ENCOUNTER — Emergency Department (HOSPITAL_COMMUNITY)
Admission: EM | Admit: 2019-09-02 | Discharge: 2019-09-03 | Disposition: A | Discharge status: Home / Self Care | Payer: Medicare Other | Attending: Emergency Medicine | Admitting: Emergency Medicine

## 2019-09-02 ENCOUNTER — Emergency Department (HOSPITAL_COMMUNITY): Payer: Medicare Other

## 2019-09-02 DIAGNOSIS — R0989 Other specified symptoms and signs involving the circulatory and respiratory systems: Secondary | ICD-10-CM | POA: Diagnosis not present

## 2019-09-02 DIAGNOSIS — R079 Chest pain, unspecified: Secondary | ICD-10-CM | POA: Diagnosis not present

## 2019-09-02 DIAGNOSIS — J45909 Unspecified asthma, uncomplicated: Secondary | ICD-10-CM | POA: Diagnosis not present

## 2019-09-02 DIAGNOSIS — Z7901 Long term (current) use of anticoagulants: Secondary | ICD-10-CM | POA: Diagnosis not present

## 2019-09-02 DIAGNOSIS — I10 Essential (primary) hypertension: Secondary | ICD-10-CM | POA: Insufficient documentation

## 2019-09-02 DIAGNOSIS — I4891 Unspecified atrial fibrillation: Secondary | ICD-10-CM | POA: Diagnosis not present

## 2019-09-02 DIAGNOSIS — I499 Cardiac arrhythmia, unspecified: Secondary | ICD-10-CM | POA: Diagnosis not present

## 2019-09-02 DIAGNOSIS — R0789 Other chest pain: Secondary | ICD-10-CM | POA: Diagnosis not present

## 2019-09-02 NOTE — ED Provider Notes (Signed)
Emergency Department Provider Note  I have reviewed the triage vital signs and the nursing notes.  HISTORY  Chief Complaint Atrial Fibrillation   HPI Nicole [name] is a 69 y.o. female with a history of paroxysmal atrial fibrillation on Eliquis  on diltiazem and Eliquis who presents emerge department today with palpitations.  Patient is approximate 45 minutes prior to calling EMS she had onset of palpitations consistent with previous episodes of atrial fibrillation.  On EMS arrival apparently she had A. fib with RVR.  Somewhere along the line I gave her 10 mg of diltiazem and on arrival here she feels much better.  She denies any new medications.  She denies any recent illnesses.  She has no other associated symptoms.  She has no chest pain or shortness of breath this time.  She states that her lower extremities might be a little more swollen than normal.  No other associated symptoms.  No other associated or modifying symptoms.    Past Medical History:  Diagnosis Date  . Anticoagulant long-term use 11/16/2014  . Anxiety 10/06/2017  . Arthritis 10/06/2017   knees bilateral  . Asthma 11/16/2014  . Esophageal reflux 11/16/2014  . Essential hypertension 08/15/2015   pt denies  . H/O: GI bleed 12/11/2016  . Hyperlipidemia 11/17/2014  . Migraine headache 11/16/2014  . Nausea 11/16/2014  . Pain in limb 11/16/2014  . Panic disorder 11/16/2014  . Paroxysmal atrial fibrillation ([place]) 11/17/2014  . Swelling of joint 11/16/2014  . Symptomatic anemia 11/24/2016  . Tendonitis 11/16/2014  . UTI (urinary tract infection) 12/19/2015    Patient Active Problem List   Diagnosis Date Noted  . Secondary hypercoagulable state ([place]) 03/08/2019  . Atrial flutter with rapid ventricular response ([place]) 12/31/2017  . Anxiety 10/06/2017  . Chest pain 10/06/2017  . Arthritis 10/06/2017  . H/O: GI bleed 12/11/2016  . GI bleed 11/24/2016  . Symptomatic anemia 11/24/2016  . Generalized weakness 07/05/2016  .  Atrial fibrillation, chronic ([place]) 07/05/2016  . Chest discomfort 12/19/2015  . UTI (urinary tract infection) 12/19/2015  . Essential hypertension 08/15/2015  . Hyperlipidemia 11/17/2014  . Paroxysmal atrial fibrillation ([place]) 11/17/2014  . Anticoagulant long-term use 11/16/2014  . Asthma 11/16/2014  . Esophageal reflux 11/16/2014  . Migraine headache 11/16/2014  . Nausea 11/16/2014  . Pain in limb 11/16/2014  . Panic disorder 11/16/2014  . Swelling of joint 11/16/2014  . Tendonitis 11/16/2014    Past Surgical History:  Procedure Laterality Date  . ABDOMINAL HYSTERECTOMY    . CARDIOVERSION    . CESAREAN SECTION    . COLONOSCOPY    . LOOP RECORDER INSERTION N/A 02/04/2018   Procedure: LOOP RECORDER INSERTION;  Surgeon: [name], MD;  Location: [facility];  Service: Cardiovascular;  Laterality: N/A;  . ROTATOR CUFF REPAIR Left     Current Outpatient Rx  . Order #: [id number] Class: Historical Med  . Order #: [id number] Class: Normal  . Order #: [id number] Class: Historical Med  . Order #: [id number] Class: Historical Med  . Order #: [id number] Class: Historical Med  . Order #: [id number] Class: Normal  . Order #: [id number] Class: Normal  . Order #: [id number] Class: Normal  . Order #: [id number] Class: Historical Med  . Order #: [id number] Class: Normal  . Order #: [id number] Class: Normal  . Order #: [id number] Class: Normal  . Order #: [id number] Class: Historical Med  . Order #: [id number] Class: Historical Med    Allergies Meperidine and Prednisone  Family History  Problem Relation Age of Onset  . Heart attack  Mother   . Heart disease Mother   . Heart disease Sister   . Heart disease Sister   . Heart disease Sister   . Heart disease Sister   . Heart disease Sister     Social History Social History   Tobacco Use  . Smoking status: Never Smoker  . Smokeless tobacco: Never Used  Substance Use Topics  . Alcohol use: Never  . Drug use: Never    Review of  Systems  All other systems negative except as documented in the HPI. All pertinent positives and negatives as reviewed in the HPI. ____________________________________________  PHYSICAL EXAM:  VITAL SIGNS: ED Triage Vitals [09/02/19 2320]  Enc Vitals Group     BP 135/84     Pulse Rate 86     Resp (!) 23     Temp 98.3 F (36.8 C)     Temp Source Oral     SpO2 100 %    Constitutional: Alert and oriented. Well appearing and in no acute distress. Eyes: Conjunctivae are normal. PERRL. EOMI. Head: Atraumatic. Nose: No congestion/rhinnorhea. Mouth/Throat: Mucous membranes are moist.  Oropharynx non-erythematous. Neck: No stridor.  No meningeal signs.   Cardiovascular: Normal rate, regular rhythm. Good peripheral circulation. Grossly normal heart sounds.   Respiratory: Normal respiratory effort.  No retractions. Lungs CTAB. Gastrointestinal: Soft and nontender. No distention.  Musculoskeletal: No lower extremity tenderness nor edema. No gross deformities of extremities. Neurologic:  Normal speech and language. No gross focal neurologic deficits are appreciated.  Skin:  Skin is warm, dry and intact. No rash noted.  ____________________________________________   LABS (all labs ordered are listed, but only abnormal results are displayed)  Labs Reviewed  COMPREHENSIVE METABOLIC PANEL - Abnormal; Notable for the following components:      Result Value   Glucose, Bld 108 (*)    All other components within normal limits  CBC WITH DIFFERENTIAL/PLATELET  MAGNESIUM   ____________________________________________  EKG   EKG Interpretation  Date/Time:  Thursday Sep 02 2019 23:24:26 EDT Ventricular Rate:  82 PR Interval:    QRS Duration: 93 QT Interval:  397 QTC Calculation: 464 R Axis:   63 Text Interpretation: Sinus rhythm ST elevation, consider inferior injury similar morphology to most recent Confirmed by Merrily Pew [phone]) on 09/02/2019 11:27:33 PM        ____________________________________________  RADIOLOGY  DG Chest Portable 1 View  Result Date: 09/02/2019 CLINICAL DATA:  Atrial fibrillation with rapid ventricular response EXAM: PORTABLE CHEST 1 VIEW COMPARISON:  10/25/2018 FINDINGS: Single frontal view of the chest demonstrates stable loop recorder. Cardiac silhouette is not enlarged. No airspace disease, effusion, or pneumothorax. No acute bony abnormality. IMPRESSION: 1. No acute process. Electronically Signed   By: Randa Ngo M.D.   On: 09/02/2019 23:42   ____________________________________________  PROCEDURES  Procedure(s) performed:   Procedures ____________________________________________  INITIAL IMPRESSION / ASSESSMENT AND PLAN / ED COURSE   This patient presents to the ED for concern of palpitations, this involves an extensive number of treatment options, and is a complaint that carries with it a high risk of complications and morbidity.  The differential diagnosis includes atrial fibrillation, coronary disease, pulmonary embolus.     Lab Tests:   I Ordered, reviewed, and interpreted labs, which included CBC, CMP and Magnesium which were all unremarkable.    Medicines ordered:   None indicated  Imaging Studies ordered:   I independently visualized and interpreted imaging CXR which showed normal  Additional history obtained:   Additional  history obtained from husband  Previous records obtained and reviewed in epic  Consultations Obtained:   I consulted noone  and discussed lab and imaging findings  Reevaluation:  After the interventions stated above, I reevaluated the patient and found persistently in normal sinus rhythm.  A medical screening exam was performed and I feel the patient has had an appropriate workup for their chief complaint at this time and likelihood of emergent condition existing is low. They have been counseled on decision, discharge, follow up and which symptoms  necessitate immediate return to the emergency department. They or their family verbally stated understanding and agreement with plan and discharged in stable condition.   ____________________________________________  FINAL CLINICAL IMPRESSION(S) / ED DIAGNOSES  Final diagnoses:  Atrial fibrillation with RVR ([place])    MEDICATIONS GIVEN DURING THIS VISIT:  Medications - No data to display  NEW OUTPATIENT MEDICATIONS STARTED DURING THIS VISIT:  Discharge Medication List as of 09/03/2019  2:22 AM      Note:  This note was prepared with assistance of Dragon voice recognition software. Occasional wrong-word or sound-a-like substitutions may have occurred due to the inherent limitations of voice recognition software.   Inell Mimbs, Corene Cornea, MD 09/03/19 [phone]

## 2019-09-02 NOTE — ED Triage Notes (Signed)
Pt arrives via [facility] from home.   Pt called out for "heart problems" on EMS arrival pt was in afib. rvr at 190-220 HR. Pt received 10 of Cardizem.  Since then pt has been in the 90s-120.  Hx a fib., & HTN.   Upon arrival to ED pt NSR no distress or chest pain.

## 2019-09-03 ENCOUNTER — Telehealth (HOSPITAL_COMMUNITY): Payer: Self-pay

## 2019-09-03 LAB — CBC WITH DIFFERENTIAL/PLATELET
Abs Immature Granulocytes: 0.02 10*3/uL (ref 0.00–0.07)
Basophils Absolute: 0 10*3/uL (ref 0.0–0.1)
Basophils Relative: 1 %
Eosinophils Absolute: 0.4 10*3/uL (ref 0.0–0.5)
Eosinophils Relative: 5 %
HCT: 41.1 % (ref 36.0–46.0)
Hemoglobin: 13 g/dL (ref 12.0–15.0)
Immature Granulocytes: 0 %
Lymphocytes Relative: 29 %
Lymphs Abs: 2.1 10*3/uL (ref 0.7–4.0)
MCH: 29.1 pg (ref 26.0–34.0)
MCHC: 31.6 g/dL (ref 30.0–36.0)
MCV: 92.2 fL (ref 80.0–100.0)
Monocytes Absolute: 1 10*3/uL (ref 0.1–1.0)
Monocytes Relative: 14 %
Neutro Abs: 3.8 10*3/uL (ref 1.7–7.7)
Neutrophils Relative %: 51 %
Platelets: 268 10*3/uL (ref 150–400)
RBC: 4.46 MIL/uL (ref 3.87–5.11)
RDW: 13 % (ref 11.5–15.5)
WBC: 7.3 10*3/uL (ref 4.0–10.5)
nRBC: 0 % (ref 0.0–0.2)

## 2019-09-03 LAB — COMPREHENSIVE METABOLIC PANEL
ALT: 14 U/L (ref 0–44)
AST: 21 U/L (ref 15–41)
Albumin: 3.8 g/dL (ref 3.5–5.0)
Alkaline Phosphatase: 75 U/L (ref 38–126)
Anion gap: 11 (ref 5–15)
BUN: 15 mg/dL (ref 8–23)
CO2: 23 mmol/L (ref 22–32)
Calcium: 9.5 mg/dL (ref 8.9–10.3)
Chloride: 105 mmol/L (ref 98–111)
Creatinine, Ser: 0.93 mg/dL (ref 0.44–1.00)
GFR calc Af Amer: >60 mL/min (ref >60)
GFR calc non Af Amer: >60 mL/min (ref >60)
Glucose, Bld: 108 mg/dL — ABNORMAL HIGH (ref 70–99)
Potassium: 4 mmol/L (ref 3.5–5.1)
Sodium: 139 mmol/L (ref 135–145)
Total Bilirubin: 0.5 mg/dL (ref 0.3–1.2)
Total Protein: 7.3 g/dL (ref 6.5–8.1)

## 2019-09-03 LAB — MAGNESIUM: Magnesium: 2.2 mg/dL (ref 1.7–2.4)

## 2019-09-03 NOTE — Telephone Encounter (Signed)
Contacted patient by phone because she was in the ER last night due to her heart rate reading 220 and her blood pressure was 210/156. She states she is feeling wiped out and just very fatigue. She checked her blood pressure while I was on the phone and the reading was 159/69 and her heart rate was 77. Advised patient to take the Cardizem 30mg  only if her heart rate is greater than 100. If her heart rate started increasing and she started to feel much worse then she should call 911 or seek the ER. Patient was told to keep her appointment with PA- Oda Kilts which is scheduled for 5/18 at 9:40am. Consulted with patient and she understood.

## 2019-09-06 NOTE — Progress Notes (Signed)
Electrophysiology Office Note Date: 09/07/2019  ID:  Nicole [name], DOB 10-23-1950, MRN [id number]  PCP: Harvie Junior, MD Primary Cardiologist: [name], MD Electrophysiologist: [name], MD   CC: ILR follow-up  Nicole [name] is a 69 y.o. female seen today for Dr. Rayann Heman . she presents today for post ED follow up. Since last being seen in our clinic, the patient reports several episodes of heart racing. Previously had an episode in February by device interrogation that she marked as a symptom. Seen in ED 5/13 after calling 911 for tachypalpitations and SOB that did not resolve with prn diltiazem. Given IV diltiazem en route with resolution. ILR shows AF RVR in 140-160 range. Denies recent illness or missed medications. QTc stable on ED EKG.   Device History: Medtronic loop recorder implanted 2019 for palpitations  Past Medical History:  Diagnosis Date  . Anticoagulant long-term use 11/16/2014  . Anxiety 10/06/2017  . Arthritis 10/06/2017   knees bilateral  . Asthma 11/16/2014  . Esophageal reflux 11/16/2014  . Essential hypertension 08/15/2015   pt denies  . H/O: GI bleed 12/11/2016  . Hyperlipidemia 11/17/2014  . Migraine headache 11/16/2014  . Nausea 11/16/2014  . Pain in limb 11/16/2014  . Panic disorder 11/16/2014  . Paroxysmal atrial fibrillation ([place]) 11/17/2014  . Swelling of joint 11/16/2014  . Symptomatic anemia 11/24/2016  . Tendonitis 11/16/2014  . UTI (urinary tract infection) 12/19/2015   Past Surgical History:  Procedure Laterality Date  . ABDOMINAL HYSTERECTOMY    . CARDIOVERSION    . CESAREAN SECTION    . COLONOSCOPY    . LOOP RECORDER INSERTION N/A 02/04/2018   Procedure: LOOP RECORDER INSERTION;  Surgeon: [name], MD;  Location: [facility];  Service: Cardiovascular;  Laterality: N/A;  . ROTATOR CUFF REPAIR Left     Current Outpatient Medications  Medication Sig Dispense Refill  . acetaminophen (TYLENOL) 500 MG tablet Take 500 mg by  mouth every 6 (six) hours as needed for mild pain or headache.    . butalbital-acetaminophen-caffeine (ESGIC) 50-325-40 MG tablet Take 1 tablet by mouth every 8 (eight) hours as needed for headache.    . butorphanol (STADOL) 10 MG/ML nasal spray Place 1 spray into the nose every 4 (four) hours as needed for headache.    . clonazePAM (KLONOPIN) 1 MG tablet Take 1 mg by mouth 3 (three) times daily.     Marland Kitchen diltiazem (CARDIZEM) 30 MG tablet Take 1 Tablet Every 4 Hours As Needed For HR >100 45 tablet 1  . dofetilide (TIKOSYN) 500 MCG capsule TAKE 1 CAPSULE BY MOUTH TWICE A DAY 180 capsule 3  . ELIQUIS 5 MG TABS tablet TAKE 1 TABLET BY MOUTH TWICE A DAY 60 tablet 6  . fluticasone-salmeterol (ADVAIR HFA) 115-21 MCG/ACT inhaler Inhale 2 puffs into the lungs 2 (two) times daily.    . furosemide (LASIX) 20 MG tablet TAKE 1 TABLET BY MOUTH EVERY DAY AS NEEDED FOR EDEMA 90 tablet 0  . potassium chloride SA (K-DUR) 20 MEQ tablet Take 1 tablet (20 mEq total) by mouth daily. 90 tablet 3  . pravastatin (PRAVACHOL) 40 MG tablet Take 1 tablet by mouth daily.    . promethazine (PHENERGAN) 25 MG tablet Take 25 mg by mouth every 6 (six) hours as needed for nausea or vomiting.    . metoprolol succinate (TOPROL-XL) 50 MG 24 hr tablet Take 1 tablet (50 mg total) by mouth daily. Take with or immediately following a meal. 90 tablet  3   No current facility-administered medications for this visit.    Allergies:   Meperidine and Prednisone   Social History: Social History   Socioeconomic History  . Marital status: Married    Spouse name: Not on file  . Number of children: Not on file  . Years of education: Not on file  . Highest education level: Not on file  Occupational History  . Not on file  Tobacco Use  . Smoking status: Never Smoker  . Smokeless tobacco: Never Used  Substance and Sexual Activity  . Alcohol use: Never  . Drug use: Never  . Sexual activity: Not on file  Other Topics Concern  . Not on file   Social History Narrative   Lives in [place] with spouse   Retired from school system.   Social Determinants of Health   Financial Resource Strain:   . Difficulty of Paying Living Expenses:   Food Insecurity:   . Worried About Charity fundraiser in the Last Year:   . Arboriculturist in the Last Year:   Transportation Needs:   . Film/video editor (Medical):   Marland Kitchen Lack of Transportation (Non-Medical):   Physical Activity:   . Days of Exercise per Week:   . Minutes of Exercise per Session:   Stress:   . Feeling of Stress :   Social Connections:   . Frequency of Communication with Friends and Family:   . Frequency of Social Gatherings with Friends and Family:   . Attends Religious Services:   . Active Member of Clubs or Organizations:   . Attends Archivist Meetings:   Marland Kitchen Marital Status:   Intimate Partner Violence:   . Fear of Current or Ex-Partner:   . Emotionally Abused:   Marland Kitchen Physically Abused:   . Sexually Abused:     Family History: Family History  Problem Relation Age of Onset  . Heart attack Mother   . Heart disease Mother   . Heart disease Sister   . Heart disease Sister   . Heart disease Sister   . Heart disease Sister   . Heart disease Sister      Review of Systems: All other systems reviewed and are otherwise negative except as noted above.  Physical Exam: Vitals:   09/07/19 0949  BP: 120/76  Pulse: 68  SpO2: 99%  Weight: 195 lb (88.5 kg)  Height: 5\' 8"  (1.727 m)     GEN- The patient is well appearing, alert and oriented x 3 today.   HEENT: normocephalic, atraumatic; sclera clear, conjunctiva pink; hearing intact; oropharynx clear; neck supple  Lungs- Clear to ausculation bilaterally, normal work of breathing.  No wheezes, rales, rhonchi Heart- Regular rate and rhythm, no murmurs, rubs or gallops  GI- soft, non-tender, non-distended, bowel sounds present  Extremities- no clubbing, cyanosis, or edema  MS- no significant deformity or  atrophy Skin- warm and dry, no rash or lesion; PPM pocket well healed Psych- euthymic mood, full affect Neuro- strength and sensation are intact  PPM Interrogation- reviewed in detail today,  See PACEART report  EKG:  EKG is not ordered today. The ekg ordered 09/02/2019 shows NSR at 82 bpm with stable QTc on tikosyn  Recent Labs: 09/29/2018: B Natriuretic Peptide 136.8 09/02/2019: ALT 14; BUN 15; Creatinine, Ser 0.93; Hemoglobin 13.0; Magnesium 2.2; Platelets 268; Potassium 4.0; Sodium 139   Wt Readings from Last 3 Encounters:  09/07/19 195 lb (88.5 kg)  07/06/19 194 lb 12.8 oz (88.4  kg)  03/08/19 188 lb (85.3 kg)     Other studies Reviewed: Additional studies/ records that were reviewed today include: Previous EP office notes, Previous remote checks, Most recent labwork.   Assessment and Plan:  1. Palpitations s/p [employer] Jude Loop recorder Normal device function See Claudia Desanctis Art report No changes today  2. Paroxysmal atrial fibrillation AF burden remains low at 0.1% but worsened symptoms, but long term follow up. Will have better picture of burden now with episodes cleared. Continue tikosyn 500 mcg BID. QTc stable Increase Toprol to 50 mg daily Continue diltiazem prn. Can consider making daily If needed. Continue Eliquis 5 mg BID Electrolytes stable 09/02/19. If she requires another ablation in the future, she would like to NOT return to Dr. Ola Spurr and have done here if possible.    Current medicines are reviewed at length with the patient today.   The patient does not have concerns regarding her medicines.  The following changes were made today:  Increase Toprol  Labs/ tests ordered today include:  Orders Placed This Encounter  Procedures  . Basic Metabolic Panel (BMET)  . TSH  . CUP PACEART INCLINIC DEVICE CHECK    Disposition:   Follow up with AF clinic in 4 Weeks for further.   Jacalyn Lefevre, PA-C  09/07/2019 11:09 AM  [facility] [address] [place] [address] [phone] (office) [phone] (fax)

## 2019-09-07 ENCOUNTER — Encounter: Payer: Self-pay | Admitting: Student

## 2019-09-07 ENCOUNTER — Ambulatory Visit (INDEPENDENT_AMBULATORY_CARE_PROVIDER_SITE_OTHER): Payer: Medicare Other | Admitting: Student

## 2019-09-07 ENCOUNTER — Other Ambulatory Visit: Payer: Self-pay

## 2019-09-07 VITALS — BP 120/76 | HR 68 | Ht 68.0 in | Wt 195.0 lb

## 2019-09-07 DIAGNOSIS — I482 Chronic atrial fibrillation, unspecified: Secondary | ICD-10-CM

## 2019-09-07 DIAGNOSIS — I480 Paroxysmal atrial fibrillation: Secondary | ICD-10-CM

## 2019-09-07 DIAGNOSIS — R002 Palpitations: Secondary | ICD-10-CM | POA: Diagnosis not present

## 2019-09-07 DIAGNOSIS — Z79899 Other long term (current) drug therapy: Secondary | ICD-10-CM

## 2019-09-07 LAB — BASIC METABOLIC PANEL
BUN/Creatinine Ratio: 19 (ref 12–28)
BUN: 17 mg/dL (ref 8–27)
CO2: 22 mmol/L (ref 20–29)
Calcium: 9.8 mg/dL (ref 8.7–10.3)
Chloride: 104 mmol/L (ref 96–106)
Creatinine, Ser: 0.89 mg/dL (ref 0.57–1.00)
GFR calc Af Amer: 77 mL/min/{1.73_m2} (ref >59)
GFR calc non Af Amer: 67 mL/min/{1.73_m2} (ref >59)
Glucose: 95 mg/dL (ref 65–99)
Potassium: 5.1 mmol/L (ref 3.5–5.2)
Sodium: 139 mmol/L (ref 134–144)

## 2019-09-07 LAB — CUP PACEART INCLINIC DEVICE CHECK
Date Time Interrogation Session: 20210518104257
Implantable Pulse Generator Implant Date: 20191016

## 2019-09-07 LAB — TSH: TSH: 2.23 u[IU]/mL (ref 0.450–4.500)

## 2019-09-07 MED ORDER — METOPROLOL SUCCINATE ER 50 MG PO TB24: 50.0000 mg | ORAL_TABLET | Freq: Every day | ORAL | 3 refills | Status: DC

## 2019-09-07 NOTE — Patient Instructions (Addendum)
Medication Instructions:  INCREASE METOPROLOL SUCCINATE TO 50 mg Daily *If you need a refill on your cardiac medications before your next appointment, please call your pharmacy*   Lab Work:  TODAY BMET TSH If you have labs (blood work) drawn today and your tests are completely normal, you will receive your results only by: Marland Kitchen MyChart Message (if you have MyChart) OR . A paper copy in the mail If you have any lab test that is abnormal or we need to change your treatment, we will call you to review the results.   Testing/Procedures: none   Follow-Up: Afib Clinic 1 month (please schedule) At [facility], you and your health needs are our priority.  As part of our continuing mission to provide you with exceptional heart care, we have created designated Provider Care Teams.  These Care Teams include your primary Cardiologist (physician) and Advanced Practice Providers (APPs -  Physician Assistants and Nurse Practitioners) who all work together to provide you with the care you need, when you need it.      Other Instructions

## 2019-09-15 ENCOUNTER — Ambulatory Visit (INDEPENDENT_AMBULATORY_CARE_PROVIDER_SITE_OTHER): Payer: Medicare Other | Admitting: *Deleted

## 2019-09-15 DIAGNOSIS — I482 Chronic atrial fibrillation, unspecified: Secondary | ICD-10-CM

## 2019-09-15 LAB — CUP PACEART REMOTE DEVICE CHECK
Date Time Interrogation Session: 20210526015315
Implantable Pulse Generator Implant Date: 20191016

## 2019-09-15 NOTE — Progress Notes (Signed)
Carelink Summary Report / Loop Recorder 

## 2019-09-23 IMAGING — DX CHEST - 2 VIEW
2 series · 2 of 2 positions shown · non-contrast
Comparison: None.

CLINICAL DATA: Atrial fibrillation.

EXAM:
CHEST - 2 VIEW

[chest pa]
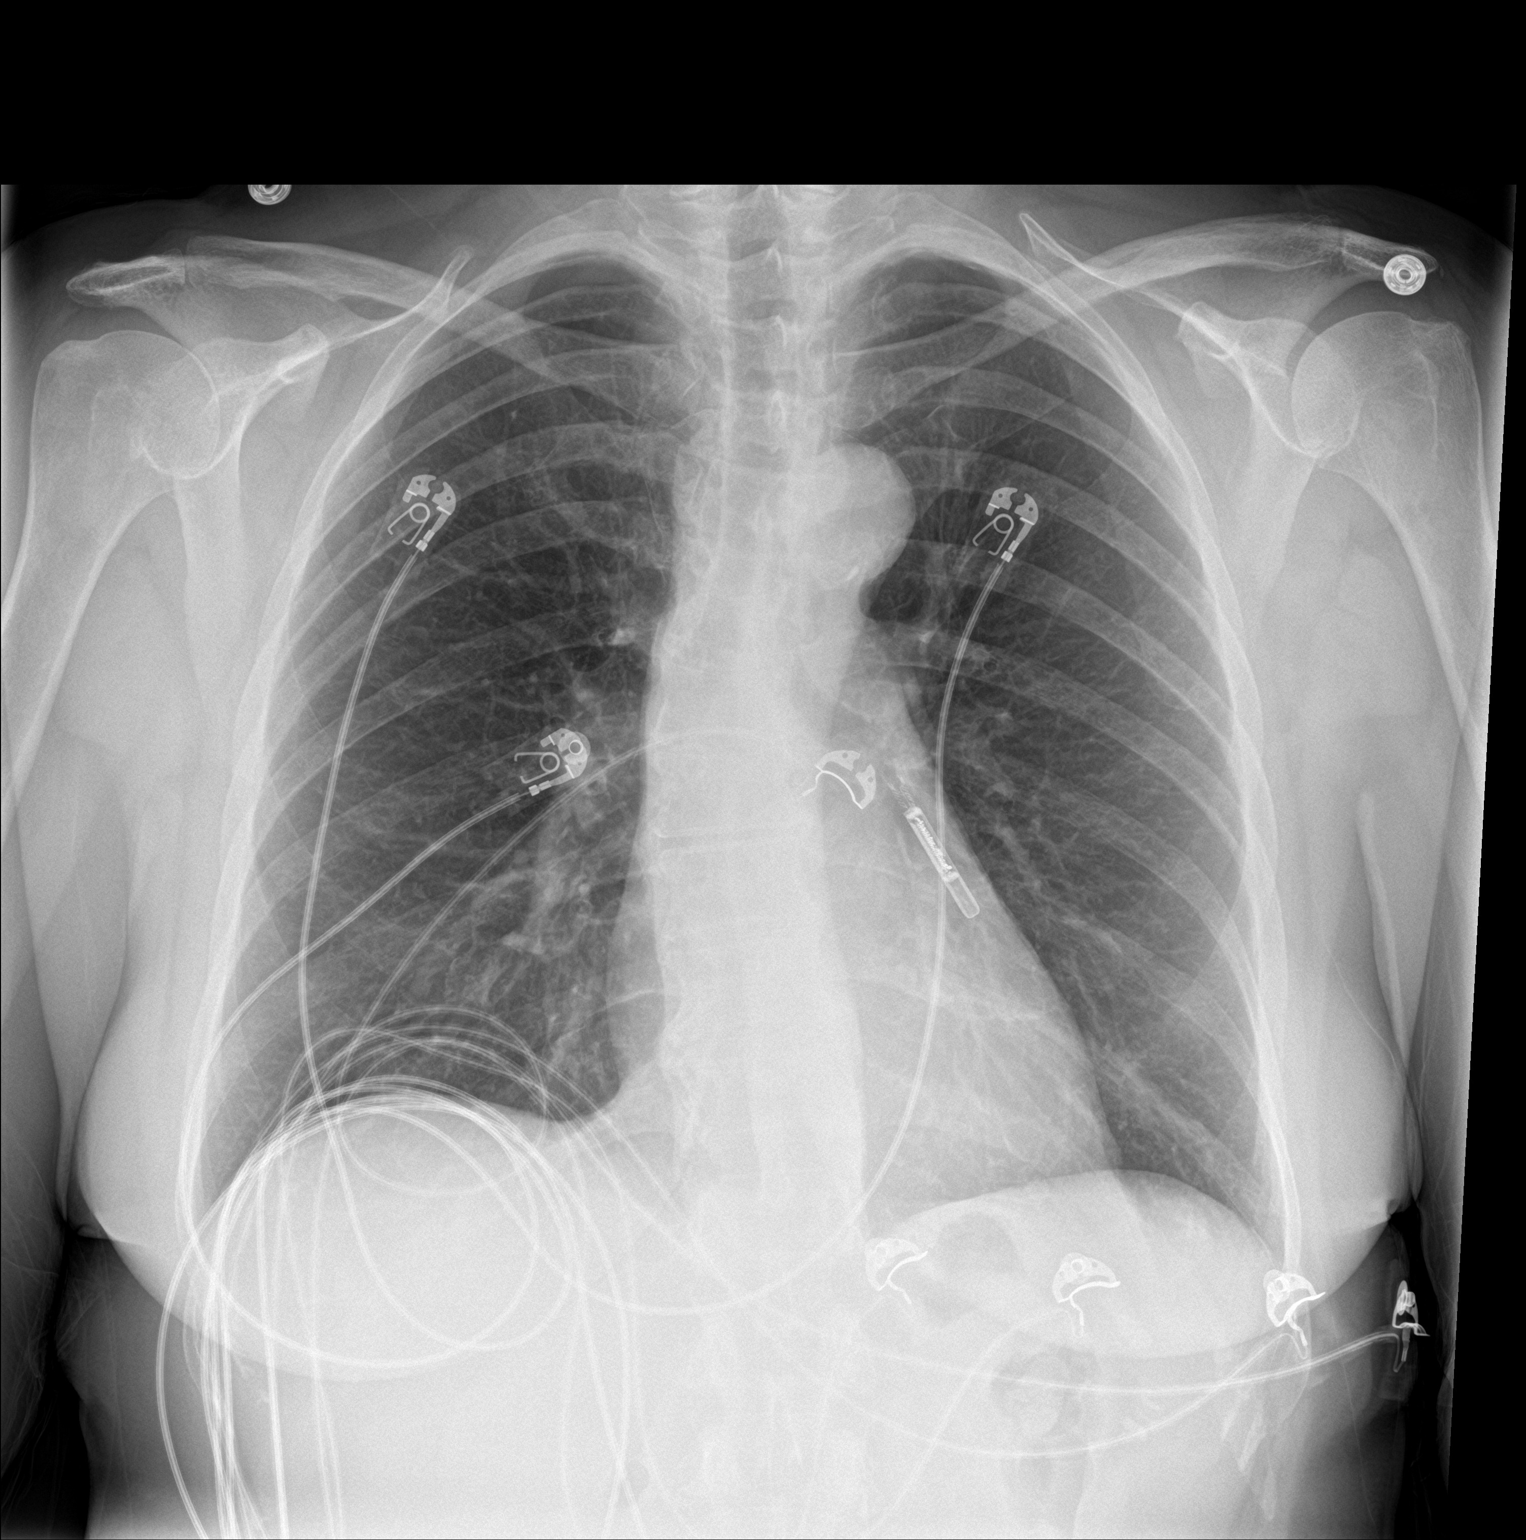

[chest lat]
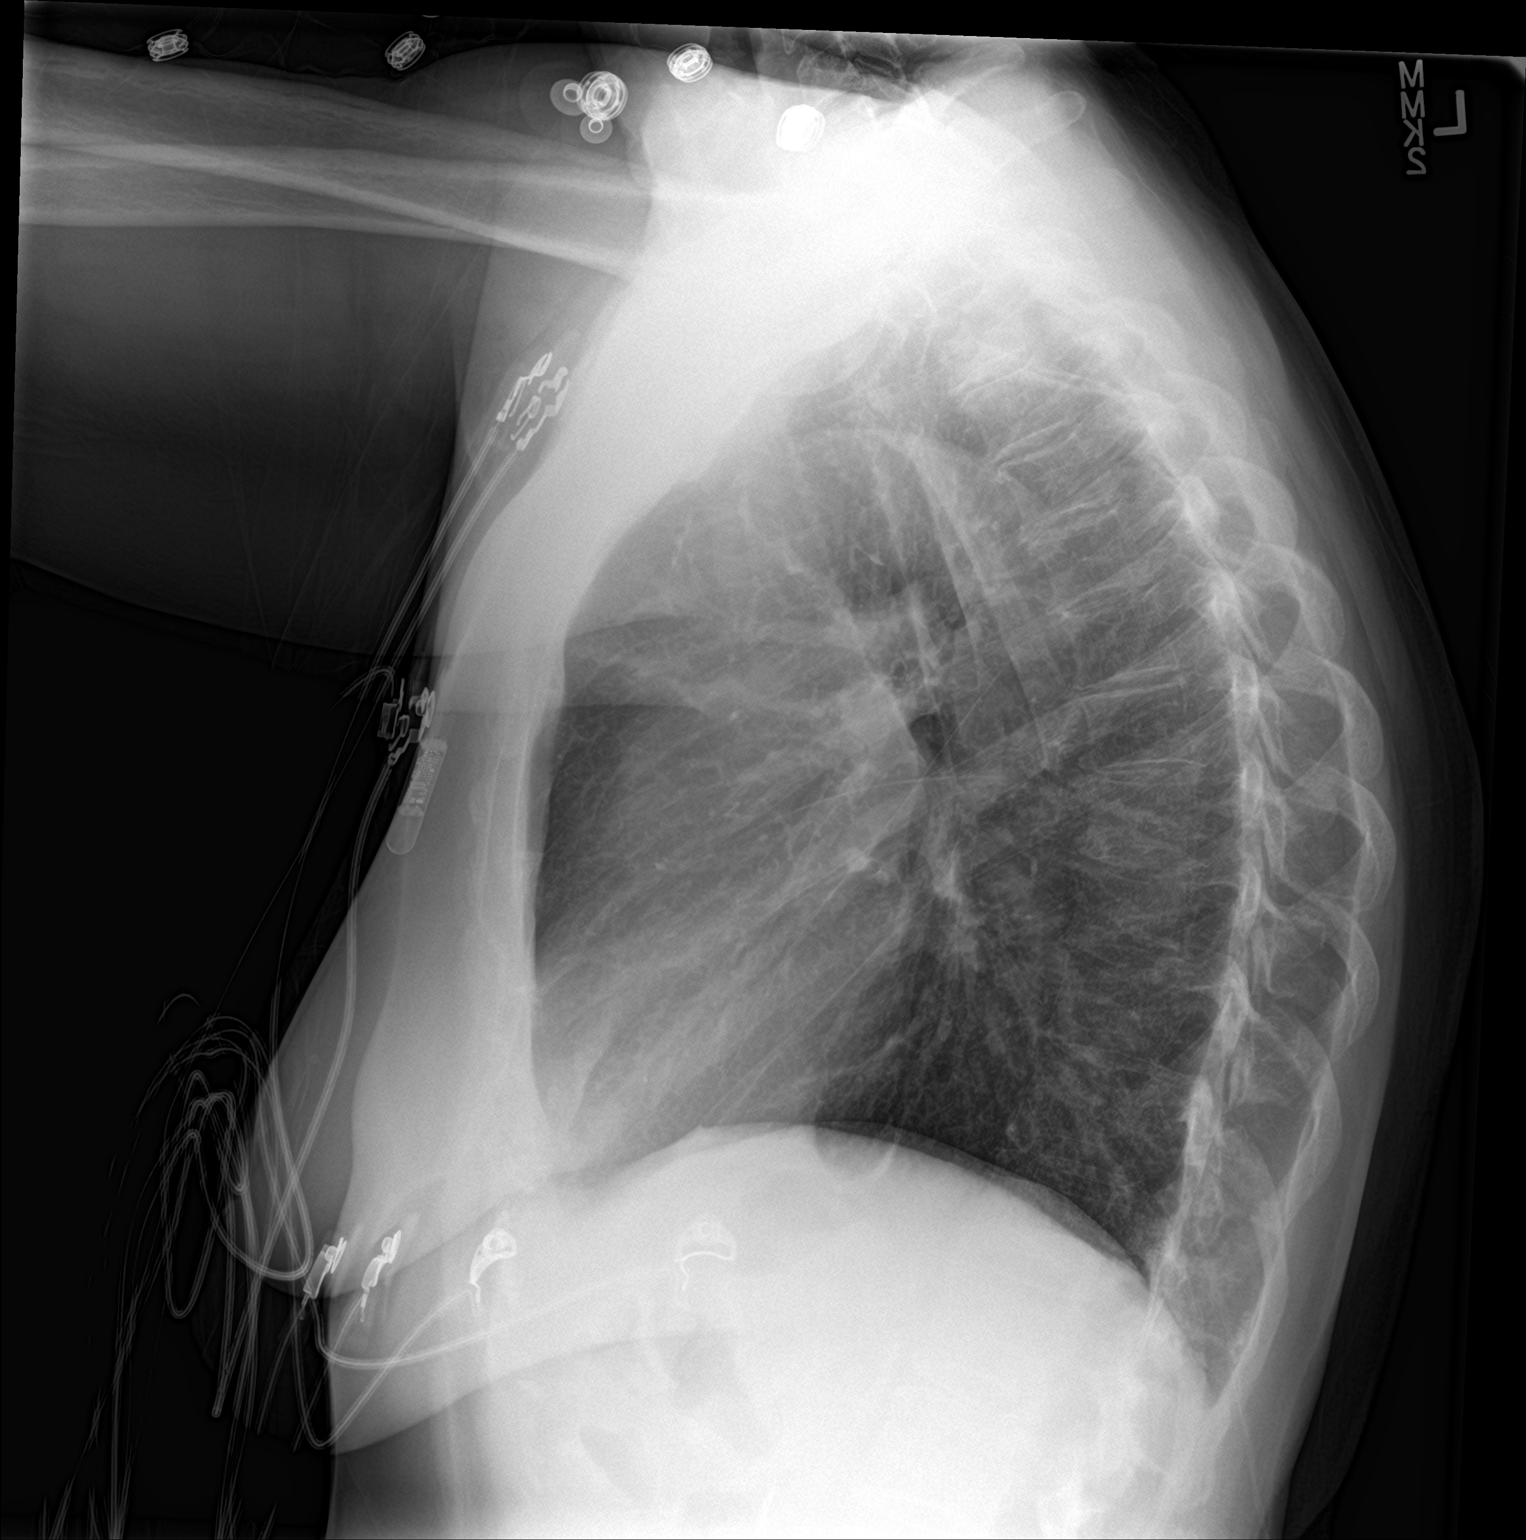

[2 of 2 positions shown; findings below may reference images not displayed]

FINDINGS: The heart size and mediastinal contours are within normal limits.
Both lungs are clear. The visualized skeletal structures are
unremarkable.
IMPRESSION: No active cardiopulmonary disease.

## 2019-10-02 ENCOUNTER — Other Ambulatory Visit: Payer: Self-pay | Admitting: Internal Medicine

## 2019-10-04 NOTE — Telephone Encounter (Signed)
Eliquis 5mg  refill request received. Patient is 69 years old, weight-88.5kg, Crea-0.89 on 09/07/2019, Diagnosis-Afib, and last seen by Rebecca Eaton on 09/07/2019. Dose is appropriate based on dosing criteria. Will send in refill to requested pharmacy.

## 2019-10-11 ENCOUNTER — Other Ambulatory Visit: Payer: Self-pay

## 2019-10-11 ENCOUNTER — Encounter (HOSPITAL_COMMUNITY): Payer: Self-pay | Admitting: Nurse Practitioner

## 2019-10-11 ENCOUNTER — Ambulatory Visit (HOSPITAL_COMMUNITY)
Admission: RE | Admit: 2019-10-11 | Discharge: 2019-10-11 | Disposition: A | Discharge status: Home / Self Care | Payer: Medicare Other | Source: Ambulatory Visit | Attending: Nurse Practitioner | Admitting: Nurse Practitioner

## 2019-10-11 VITALS — BP 162/100 | HR 67 | Ht 68.0 in | Wt 197.4 lb

## 2019-10-11 DIAGNOSIS — I480 Paroxysmal atrial fibrillation: Secondary | ICD-10-CM | POA: Diagnosis not present

## 2019-10-11 DIAGNOSIS — D6869 Other thrombophilia: Secondary | ICD-10-CM | POA: Diagnosis not present

## 2019-10-11 DIAGNOSIS — Z87440 Personal history of urinary (tract) infections: Secondary | ICD-10-CM | POA: Diagnosis not present

## 2019-10-11 DIAGNOSIS — Z885 Allergy status to narcotic agent status: Secondary | ICD-10-CM | POA: Diagnosis not present

## 2019-10-11 DIAGNOSIS — Z7901 Long term (current) use of anticoagulants: Secondary | ICD-10-CM | POA: Diagnosis not present

## 2019-10-11 DIAGNOSIS — Z888 Allergy status to other drugs, medicaments and biological substances status: Secondary | ICD-10-CM | POA: Diagnosis not present

## 2019-10-11 DIAGNOSIS — Z8249 Family history of ischemic heart disease and other diseases of the circulatory system: Secondary | ICD-10-CM | POA: Insufficient documentation

## 2019-10-11 DIAGNOSIS — Z79899 Other long term (current) drug therapy: Secondary | ICD-10-CM | POA: Diagnosis not present

## 2019-10-11 DIAGNOSIS — I10 Essential (primary) hypertension: Secondary | ICD-10-CM | POA: Insufficient documentation

## 2019-10-11 DIAGNOSIS — J45909 Unspecified asthma, uncomplicated: Secondary | ICD-10-CM | POA: Insufficient documentation

## 2019-10-11 DIAGNOSIS — E785 Hyperlipidemia, unspecified: Secondary | ICD-10-CM | POA: Diagnosis not present

## 2019-10-11 DIAGNOSIS — I4891 Unspecified atrial fibrillation: Secondary | ICD-10-CM | POA: Diagnosis present

## 2019-10-11 DIAGNOSIS — F419 Anxiety disorder, unspecified: Secondary | ICD-10-CM | POA: Insufficient documentation

## 2019-10-11 DIAGNOSIS — Z7951 Long term (current) use of inhaled steroids: Secondary | ICD-10-CM | POA: Diagnosis not present

## 2019-10-11 NOTE — Progress Notes (Signed)
Primary Care Physician: Harvie Junior, MD Referring Physician: Dr. [name] Farrey is a 69 y.o. female with a h/o anemia,  afib, and syncope possibly 2/2 termination pause  that presented to his office with afib with RVR at 160 bpm and was admitted for Tikosyn load, 01/2018.Converted to SR without pause. Qtc remained stable. She felt improved. She is on Eliquis for a CHADS2VASC score of 3.  She is in the afib clinic for f/u. Recent paceart, 09/15/19, report did not show any arrhythmias.She reports compliance with tikosyn as well as eliquis 5 mg bid.   She had a ER visit for afib with RVR, EMS was called and she had a HR of 190-220 bpm. She had a bolus of 10 mg cardizem and HR slowed to the 90-120's. She saw Oda Kilts, PA on f/u and Toprol was increased to 50 mg daily. She has some fatigue on this dose but no further episodes of afib. Tikosyn labs drawn in May were stable.   She  continues in SR today. She  feels well other than fatigue on higher dose of BB. No issues with eliquis 5 mg bid with a CHA2DS2VASc score of 3. Does not think she will get her covid shot as she is concerned with what is in the shot. She  continues to  wear a mask in public.  Today, she denies symptoms of palpitations, chest pain, shortness of breath, orthopnea, PND, dizziness, presyncope, syncope, or neurologic sequela. The patient is tolerating medications without difficulties and is otherwise without complaint today.   Past Medical History:  Diagnosis Date  . Anticoagulant long-term use 11/16/2014  . Anxiety 10/06/2017  . Arthritis 10/06/2017   knees bilateral  . Asthma 11/16/2014  . Esophageal reflux 11/16/2014  . Essential hypertension 08/15/2015   pt denies  . H/O: GI bleed 12/11/2016  . Hyperlipidemia 11/17/2014  . Migraine headache 11/16/2014  . Nausea 11/16/2014  . Pain in limb 11/16/2014  . Panic disorder 11/16/2014  . Paroxysmal atrial fibrillation ([place]) 11/17/2014  . Swelling of joint 11/16/2014   . Symptomatic anemia 11/24/2016  . Tendonitis 11/16/2014  . UTI (urinary tract infection) 12/19/2015   Past Surgical History:  Procedure Laterality Date  . ABDOMINAL HYSTERECTOMY    . CARDIOVERSION    . CESAREAN SECTION    . COLONOSCOPY    . LOOP RECORDER INSERTION N/A 02/04/2018   Procedure: LOOP RECORDER INSERTION;  Surgeon: [name], MD;  Location: [facility];  Service: Cardiovascular;  Laterality: N/A;  . ROTATOR CUFF REPAIR Left     Current Outpatient Medications  Medication Sig Dispense Refill  . acetaminophen (TYLENOL) 500 MG tablet Take 500 mg by mouth every 6 (six) hours as needed for mild pain or headache.    . butalbital-acetaminophen-caffeine (ESGIC) 50-325-40 MG tablet Take 1 tablet by mouth every 8 (eight) hours as needed for headache.    . butorphanol (STADOL) 10 MG/ML nasal spray Place 1 spray into the nose every 4 (four) hours as needed for headache.    . clonazePAM (KLONOPIN) 1 MG tablet Take 1 mg by mouth 3 (three) times daily.     Marland Kitchen diltiazem (CARDIZEM) 30 MG tablet Take 1 Tablet Every 4 Hours As Needed For HR >100 45 tablet 1  . dofetilide (TIKOSYN) 500 MCG capsule TAKE 1 CAPSULE BY MOUTH TWICE A DAY 180 capsule 3  . ELIQUIS 5 MG TABS tablet TAKE 1 TABLET BY MOUTH TWICE A DAY 60 tablet 10  .  fluticasone-salmeterol (ADVAIR HFA) 115-21 MCG/ACT inhaler Inhale 2 puffs into the lungs 2 (two) times daily.    . furosemide (LASIX) 20 MG tablet TAKE 1 TABLET BY MOUTH EVERY DAY AS NEEDED FOR EDEMA 90 tablet 0  . metoprolol succinate (TOPROL-XL) 50 MG 24 hr tablet Take 1 tablet (50 mg total) by mouth daily. Take with or immediately following a meal. 90 tablet 3  . potassium chloride SA (K-DUR) 20 MEQ tablet Take 1 tablet (20 mEq total) by mouth daily. 90 tablet 3  . pravastatin (PRAVACHOL) 40 MG tablet Take 1 tablet by mouth daily.    . promethazine (PHENERGAN) 25 MG tablet Take 25 mg by mouth every 6 (six) hours as needed for nausea or vomiting.     No current  facility-administered medications for this encounter.    Allergies  Allergen Reactions  . Meperidine Nausea And Vomiting    States she "About passed out"  . Prednisone Palpitations    Tachycardia     Social History   Socioeconomic History  . Marital status: Married    Spouse name: Not on file  . Number of children: Not on file  . Years of education: Not on file  . Highest education level: Not on file  Occupational History  . Not on file  Tobacco Use  . Smoking status: Never Smoker  . Smokeless tobacco: Never Used  Vaping Use  . Vaping Use: Never used  Substance and Sexual Activity  . Alcohol use: Never  . Drug use: Never  . Sexual activity: Not on file  Other Topics Concern  . Not on file  Social History Narrative   Lives in [place] with spouse   Retired from school system.   Social Determinants of Health   Financial Resource Strain:   . Difficulty of Paying Living Expenses:   Food Insecurity:   . Worried About Charity fundraiser in the Last Year:   . Arboriculturist in the Last Year:   Transportation Needs:   . Film/video editor (Medical):   Marland Kitchen Lack of Transportation (Non-Medical):   Physical Activity:   . Days of Exercise per Week:   . Minutes of Exercise per Session:   Stress:   . Feeling of Stress :   Social Connections:   . Frequency of Communication with Friends and Family:   . Frequency of Social Gatherings with Friends and Family:   . Attends Religious Services:   . Active Member of Clubs or Organizations:   . Attends Archivist Meetings:   Marland Kitchen Marital Status:   Intimate Partner Violence:   . Fear of Current or Ex-Partner:   . Emotionally Abused:   Marland Kitchen Physically Abused:   . Sexually Abused:     Family History  Problem Relation Age of Onset  . Heart attack Mother   . Heart disease Mother   . Heart disease Sister   . Heart disease Sister   . Heart disease Sister   . Heart disease Sister   . Heart disease Sister     ROS-  All systems are reviewed and negative except as per the HPI above  Physical Exam: There were no vitals filed for this visit. Wt Readings from Last 3 Encounters:  09/07/19 88.5 kg  07/06/19 88.4 kg  03/08/19 85.3 kg    Labs: Lab Results  Component Value Date   NA 139 09/07/2019   K 5.1 09/07/2019   CL 104 09/07/2019   CO2 22 09/07/2019  GLUCOSE 95 09/07/2019   BUN 17 09/07/2019   CREATININE 0.89 09/07/2019   CALCIUM 9.8 09/07/2019   MG 2.2 09/02/2019   No results found for: INR No results found for: CHOL, HDL, LDLCALC, TRIG  GEN- The patient is well appearing, alert and oriented x 3 today.   HEENT-head normocephalic, atraumatic, sclera clear, conjunctiva pink, hearing intact, trachea midline. Lungs- Clear to ausculation bilaterally, normal work of breathing Heart- Regular rate and rhythm, no murmurs, rubs or gallops  GI- soft, NT, ND, + BS Extremities- no clubbing, cyanosis. Non pitting edema MS- no significant deformity or atrophy Skin- no rash or lesion Psych- euthymic mood, full affect Neuro- strength and sensation are intact   EKG- NSR at 67 bpm, PR int 194 ms, qrs int 84 ms, qtc 448 ms  Epic records reviewed   Assessment and Plan: 1. Paroxysmal atrial fibrillation    ER visit for afib with RVR in May BB has been increased and so far no further, some fatigue on increased BB, but she prefers to hold the course Continue tikosyn 500 mcg bid  This patients CHA2DS2-VASc Score and unadjusted Ischemic Stroke Rate (% per year) is equal to 3.2 % stroke rate/year from a score of 3  Above score calculated as 1 point each if present [CHF, HTN, DM, Vascular=MI/PAD/Aortic Plaque, Age if 65-74, or Female] Above score calculated as 2 points each if present [Age > 75, or Stroke/TIA/TE]  2. HTN Elevated on presentation Recheck at 142/90  3. Linq Per device clinic No significant arrhythmia on report today other than ER  episode of 5/13   Follow up with Dr. Rayann Heman in 4  months    Adline Peals PA-C [facility] [address] [place], [place] [address] [phone]

## 2019-10-12 ENCOUNTER — Other Ambulatory Visit (HOSPITAL_COMMUNITY): Payer: Self-pay | Admitting: Nurse Practitioner

## 2019-10-18 ENCOUNTER — Ambulatory Visit (INDEPENDENT_AMBULATORY_CARE_PROVIDER_SITE_OTHER): Payer: Medicare Other | Admitting: *Deleted

## 2019-10-18 DIAGNOSIS — I480 Paroxysmal atrial fibrillation: Secondary | ICD-10-CM | POA: Diagnosis not present

## 2019-10-18 LAB — CUP PACEART REMOTE DEVICE CHECK
Date Time Interrogation Session: 20210628030530
Implantable Pulse Generator Implant Date: 20191016

## 2019-10-19 NOTE — Progress Notes (Signed)
Carelink Summary Report / Loop Recorder 

## 2019-11-09 ENCOUNTER — Ambulatory Visit (HOSPITAL_COMMUNITY): Payer: Medicare Other | Admitting: Physician Assistant

## 2019-11-22 ENCOUNTER — Ambulatory Visit (INDEPENDENT_AMBULATORY_CARE_PROVIDER_SITE_OTHER): Payer: Medicare Other | Admitting: *Deleted

## 2019-11-22 DIAGNOSIS — I482 Chronic atrial fibrillation, unspecified: Secondary | ICD-10-CM | POA: Diagnosis not present

## 2019-11-23 LAB — CUP PACEART REMOTE DEVICE CHECK
Date Time Interrogation Session: 20210731031015
Implantable Pulse Generator Implant Date: 20191016

## 2019-11-24 NOTE — Progress Notes (Signed)
Carelink Summary Report / Loop Recorder 

## 2019-12-17 ENCOUNTER — Other Ambulatory Visit: Payer: Self-pay | Admitting: Internal Medicine

## 2019-12-26 LAB — CUP PACEART REMOTE DEVICE CHECK
Date Time Interrogation Session: 20210902031229
Implantable Pulse Generator Implant Date: 20191016

## 2019-12-28 ENCOUNTER — Ambulatory Visit (INDEPENDENT_AMBULATORY_CARE_PROVIDER_SITE_OTHER): Payer: Medicare Other | Admitting: *Deleted

## 2019-12-28 DIAGNOSIS — I482 Chronic atrial fibrillation, unspecified: Secondary | ICD-10-CM

## 2019-12-30 NOTE — Progress Notes (Signed)
Carelink Summary Report / Loop Recorder 

## 2020-01-04 ENCOUNTER — Other Ambulatory Visit (HOSPITAL_COMMUNITY): Payer: Self-pay | Admitting: Nurse Practitioner

## 2020-01-22 DIAGNOSIS — I499 Cardiac arrhythmia, unspecified: Secondary | ICD-10-CM | POA: Diagnosis not present

## 2020-01-22 DIAGNOSIS — I10 Essential (primary) hypertension: Secondary | ICD-10-CM | POA: Diagnosis not present

## 2020-01-22 DIAGNOSIS — R0789 Other chest pain: Secondary | ICD-10-CM | POA: Diagnosis not present

## 2020-01-22 DIAGNOSIS — R000 Tachycardia, unspecified: Secondary | ICD-10-CM | POA: Diagnosis not present

## 2020-01-22 DIAGNOSIS — R079 Chest pain, unspecified: Secondary | ICD-10-CM | POA: Diagnosis not present

## 2020-01-23 ENCOUNTER — Encounter (HOSPITAL_COMMUNITY): Payer: Self-pay | Admitting: Emergency Medicine

## 2020-01-23 ENCOUNTER — Emergency Department (HOSPITAL_COMMUNITY)
Admission: EM | Admit: 2020-01-23 | Discharge: 2020-01-23 | Disposition: A | Discharge status: Home / Self Care | Payer: Medicare Other | Attending: Emergency Medicine | Admitting: Emergency Medicine

## 2020-01-23 ENCOUNTER — Emergency Department (HOSPITAL_COMMUNITY): Payer: Medicare Other

## 2020-01-23 ENCOUNTER — Other Ambulatory Visit: Payer: Self-pay

## 2020-01-23 DIAGNOSIS — R42 Dizziness and giddiness: Secondary | ICD-10-CM | POA: Diagnosis present

## 2020-01-23 DIAGNOSIS — Z7901 Long term (current) use of anticoagulants: Secondary | ICD-10-CM | POA: Diagnosis not present

## 2020-01-23 DIAGNOSIS — R0602 Shortness of breath: Secondary | ICD-10-CM | POA: Diagnosis not present

## 2020-01-23 DIAGNOSIS — I480 Paroxysmal atrial fibrillation: Secondary | ICD-10-CM

## 2020-01-23 DIAGNOSIS — I10 Essential (primary) hypertension: Secondary | ICD-10-CM | POA: Diagnosis not present

## 2020-01-23 DIAGNOSIS — I700 Atherosclerosis of aorta: Secondary | ICD-10-CM | POA: Diagnosis not present

## 2020-01-23 DIAGNOSIS — I4891 Unspecified atrial fibrillation: Secondary | ICD-10-CM | POA: Diagnosis not present

## 2020-01-23 LAB — CBC WITH DIFFERENTIAL/PLATELET
Abs Immature Granulocytes: 0.03 10*3/uL (ref 0.00–0.07)
Basophils Absolute: 0 10*3/uL (ref 0.0–0.1)
Basophils Relative: 0 %
Eosinophils Absolute: 0.2 10*3/uL (ref 0.0–0.5)
Eosinophils Relative: 3 %
HCT: 39.7 % (ref 36.0–46.0)
Hemoglobin: 12.6 g/dL (ref 12.0–15.0)
Immature Granulocytes: 0 %
Lymphocytes Relative: 29 %
Lymphs Abs: 2.8 10*3/uL (ref 0.7–4.0)
MCH: 28.7 pg (ref 26.0–34.0)
MCHC: 31.7 g/dL (ref 30.0–36.0)
MCV: 90.4 fL (ref 80.0–100.0)
Monocytes Absolute: 1 10*3/uL (ref 0.1–1.0)
Monocytes Relative: 10 %
Neutro Abs: 5.7 10*3/uL (ref 1.7–7.7)
Neutrophils Relative %: 58 %
Platelets: 258 10*3/uL (ref 150–400)
RBC: 4.39 MIL/uL (ref 3.87–5.11)
RDW: 13 % (ref 11.5–15.5)
WBC: 9.7 10*3/uL (ref 4.0–10.5)
nRBC: 0 % (ref 0.0–0.2)

## 2020-01-23 LAB — COMPREHENSIVE METABOLIC PANEL
ALT: 17 U/L (ref 0–44)
AST: 20 U/L (ref 15–41)
Albumin: 4.2 g/dL (ref 3.5–5.0)
Alkaline Phosphatase: 78 U/L (ref 38–126)
Anion gap: 12 (ref 5–15)
BUN: 20 mg/dL (ref 8–23)
CO2: 20 mmol/L — ABNORMAL LOW (ref 22–32)
Calcium: 9.5 mg/dL (ref 8.9–10.3)
Chloride: 107 mmol/L (ref 98–111)
Creatinine, Ser: 1.22 mg/dL — ABNORMAL HIGH (ref 0.44–1.00)
GFR calc Af Amer: 52 mL/min — ABNORMAL LOW (ref >60)
GFR calc non Af Amer: 45 mL/min — ABNORMAL LOW (ref >60)
Glucose, Bld: 109 mg/dL — ABNORMAL HIGH (ref 70–99)
Potassium: 4.2 mmol/L (ref 3.5–5.1)
Sodium: 139 mmol/L (ref 135–145)
Total Bilirubin: 0.6 mg/dL (ref 0.3–1.2)
Total Protein: 7.7 g/dL (ref 6.5–8.1)

## 2020-01-23 LAB — TROPONIN I (HIGH SENSITIVITY): Troponin I (High Sensitivity): 6 ng/L (ref <18)

## 2020-01-23 LAB — BRAIN NATRIURETIC PEPTIDE: B Natriuretic Peptide: 101.5 pg/mL — ABNORMAL HIGH (ref 0.0–100.0)

## 2020-01-23 MED ORDER — SODIUM CHLORIDE 0.9 % IV BOLUS: 1000.0000 mL | Freq: Once | INTRAVENOUS | Status: DC

## 2020-01-23 MED ORDER — PROPOFOL 10 MG/ML IV BOLUS
0.5000 mg/kg | Freq: Once | INTRAVENOUS | Status: DC
  Filled 2020-01-23: qty 20

## 2020-01-23 NOTE — Discharge Instructions (Signed)
Follow-up with Dr. Rayann Heman as scheduled.  Can call office for earlier appointment if needed. Continue all your usual medications. Return here for any new/acute changes.

## 2020-01-23 NOTE — ED Triage Notes (Signed)
Per [facility], pt from home, called out for "afib" b/c her "heart was beating real fast."  Pt has hx of same.  EMS gave 10mg  Cardizen and 577ml NS but no change.    160/90 98% RA 18G R AC HR for 80-190

## 2020-01-23 NOTE — ED Provider Notes (Signed)
[facility] EMERGENCY DEPARTMENT Provider Note   CSN: [id number] Arrival date & time: 01/23/20  0024     History Chief Complaint  Patient presents with  . Atrial Fibrillation    Nicole [name] is a 69 y.o. female.  The history is provided by the patient and medical records.  Atrial Fibrillation   69 year old female with history of paroxysmal A. fib on Eliquis, anemia, migraine headaches, hypertension, reflux, arthritis, presenting to the ED with A. fib w/RVR.  Patient states she has been at the beach for the past 2 weeks and has been fine.  Returned home yesterday and was just cleaning up around the house and unpacking.  States this evening she took her nighttime medications around 8 PM and about 15 minutes later she developed onset of palpitations, lightheadedness, dizziness, shortness of breath, and some chest discomfort.  States it feels consistent with prior episodes of A. fib.  States she is not exactly sure what caused this.  She denies any new stress, excessive caffeine intake, new medications, lack of sleep, etc.  She has been compliant with all of her medications, no missed doses.  She is followed closely by cardiology, Dr. Rayann Heman.  She has required cardioversion in the past.  She is also had failed ablation.  Past Medical History:  Diagnosis Date  . Anticoagulant long-term use 11/16/2014  . Anxiety 10/06/2017  . Arthritis 10/06/2017   knees bilateral  . Asthma 11/16/2014  . Esophageal reflux 11/16/2014  . Essential hypertension 08/15/2015   pt denies  . H/O: GI bleed 12/11/2016  . Hyperlipidemia 11/17/2014  . Migraine headache 11/16/2014  . Nausea 11/16/2014  . Pain in limb 11/16/2014  . Panic disorder 11/16/2014  . Paroxysmal atrial fibrillation ([place]) 11/17/2014  . Swelling of joint 11/16/2014  . Symptomatic anemia 11/24/2016  . Tendonitis 11/16/2014  . UTI (urinary tract infection) 12/19/2015    Patient Active Problem List   Diagnosis Date Noted  .  Secondary hypercoagulable state ([place]) 03/08/2019  . Atrial flutter with rapid ventricular response ([place]) 12/31/2017  . Anxiety 10/06/2017  . Chest pain 10/06/2017  . Arthritis 10/06/2017  . H/O: GI bleed 12/11/2016  . GI bleed 11/24/2016  . Symptomatic anemia 11/24/2016  . Generalized weakness 07/05/2016  . Atrial fibrillation, chronic ([place]) 07/05/2016  . Chest discomfort 12/19/2015  . UTI (urinary tract infection) 12/19/2015  . Essential hypertension 08/15/2015  . Hyperlipidemia 11/17/2014  . Paroxysmal atrial fibrillation ([place]) 11/17/2014  . Anticoagulant long-term use 11/16/2014  . Asthma 11/16/2014  . Esophageal reflux 11/16/2014  . Migraine headache 11/16/2014  . Nausea 11/16/2014  . Pain in limb 11/16/2014  . Panic disorder 11/16/2014  . Swelling of joint 11/16/2014  . Tendonitis 11/16/2014    Past Surgical History:  Procedure Laterality Date  . ABDOMINAL HYSTERECTOMY    . CARDIOVERSION    . CESAREAN SECTION    . COLONOSCOPY    . LOOP RECORDER INSERTION N/A 02/04/2018   Procedure: LOOP RECORDER INSERTION;  Surgeon: [name], MD;  Location: [facility];  Service: Cardiovascular;  Laterality: N/A;  . ROTATOR CUFF REPAIR Left      OB History   No obstetric history on file.     Family History  Problem Relation Age of Onset  . Heart attack Mother   . Heart disease Mother   . Heart disease Sister   . Heart disease Sister   . Heart disease Sister   . Heart disease Sister   . Heart disease  Sister     Social History   Tobacco Use  . Smoking status: Never Smoker  . Smokeless tobacco: Never Used  Vaping Use  . Vaping Use: Never used  Substance Use Topics  . Alcohol use: Never  . Drug use: Never    Home Medications Prior to Admission medications   Medication Sig Start Date End Date Taking? Authorizing Provider  acetaminophen (TYLENOL) 500 MG tablet Take 500 mg by mouth every 6 (six) hours as needed for mild pain or headache.    [provider]  butalbital-acetaminophen-caffeine (ESGIC) 50-325-40 MG tablet Take 1 tablet by mouth every 8 (eight) hours as needed for headache.    [provider]  butorphanol (STADOL) 10 MG/ML nasal spray Place 1 spray into the nose every 4 (four) hours as needed for headache.    [provider]  clonazePAM (KLONOPIN) 1 MG tablet Take 1 mg by mouth 3 (three) times daily.     [provider]  diltiazem (CARDIZEM) 30 MG tablet Take 1 Tablet Every 4 Hours As Needed For HR >100 08/31/19   Fenton, Clint R, PA  dofetilide (TIKOSYN) 500 MCG capsule TAKE 1 CAPSULE BY MOUTH TWICE A DAY 12/17/19   Allred, Jeneen Rinks, MD  ELIQUIS 5 MG TABS tablet TAKE 1 TABLET BY MOUTH TWICE A DAY 10/04/19   Allred, Jeneen Rinks, MD  fluticasone-salmeterol (ADVAIR HFA) 115-21 MCG/ACT inhaler Inhale 2 puffs into the lungs 2 (two) times daily.    [provider]  furosemide (LASIX) 20 MG tablet TAKE 1 TABLET BY MOUTH EVERY DAY AS NEEDED FOR EDEMA 01/04/20   Allred, Jeneen Rinks, MD  metoprolol succinate (TOPROL-XL) 50 MG 24 hr tablet Take 1 tablet (50 mg total) by mouth daily. Take with or immediately following a meal. 09/07/19 12/06/19  Tillery, Satira Mccallum, PA-C  potassium chloride SA (K-DUR) 20 MEQ tablet Take 1 tablet (20 mEq total) by mouth daily. 12/14/18   Sherran Needs, NP  pravastatin (PRAVACHOL) 40 MG tablet Take 1 tablet by mouth daily. 11/30/18   [provider]  promethazine (PHENERGAN) 25 MG tablet Take 25 mg by mouth every 6 (six) hours as needed for nausea or vomiting.    [provider]    Allergies    Meperidine and Prednisone  Review of Systems   Review of Systems  Cardiovascular: Positive for palpitations.  All other systems reviewed and are negative.   Physical Exam Updated Vital Signs BP (!) 166/98 (BP Location: Right Arm)   Pulse (!) 59   Temp 98 F (36.7 C) (Oral)   Resp 18   Ht 5\' 8"  (1.727 m)   Wt 88.5 kg   SpO2 100%   BMI 29.65 kg/m   Physical  Exam Vitals and nursing note reviewed.  Constitutional:      Appearance: She is well-developed.  HENT:     Head: Normocephalic and atraumatic.  Eyes:     Conjunctiva/sclera: Conjunctivae normal.     Pupils: Pupils are equal, round, and reactive to light.  Cardiovascular:     Rate and Rhythm: Tachycardia present. Rhythm regularly irregular.     Heart sounds: Normal heart sounds.     Comments: AFIB w/RVR, rate 120's-150's during exam Pulmonary:     Effort: Pulmonary effort is normal.     Breath sounds: Normal breath sounds.  Abdominal:     General: Bowel sounds are normal.     Palpations: Abdomen is soft.  Musculoskeletal:        General: Normal  range of motion.     Cervical back: Normal range of motion.     Comments: Trace edema bilateral ankles, symmetric, no erythema or induration  Skin:    General: Skin is warm and dry.  Neurological:     Mental Status: She is alert and oriented to person, place, and time.     ED Results / Procedures / Treatments   Labs (all labs ordered are listed, but only abnormal results are displayed) Labs Reviewed  COMPREHENSIVE METABOLIC PANEL - Abnormal; Notable for the following components:      Result Value   CO2 20 (*)    Glucose, Bld 109 (*)    Creatinine, Ser 1.22 (*)    GFR calc non Af Amer 45 (*)    GFR calc Af Amer 52 (*)    All other components within normal limits  BRAIN NATRIURETIC PEPTIDE - Abnormal; Notable for the following components:   B Natriuretic Peptide 101.5 (*)    All other components within normal limits  CBC WITH DIFFERENTIAL/PLATELET  MAGNESIUM  TROPONIN I (HIGH SENSITIVITY)    EKG EKG Interpretation  Date/Time:  Sunday January 23 2020 00:27:25 EDT Ventricular Rate:  145 PR Interval:    QRS Duration: 89 QT Interval:  338 QTC Calculation: 474 R Axis:   63 Text Interpretation: Atrial fibrillation with RVR Ventricular tachycardia, unsustained ST depression, probably rate related afib has replaced NSR from  June 2021 Confirmed by Sherwood Gambler [phone]) on 01/23/2020 12:47:44 AM   Radiology DG Chest Port 1 View  Result Date: 01/23/2020 CLINICAL DATA:  AFib EXAM: PORTABLE CHEST 1 VIEW COMPARISON:  Sep 02, 2018 FINDINGS: The heart size and mediastinal contours are within normal limits. Aortic knob calcifications are seen. Overlying loop recorder is noted. Both lungs are clear. The visualized skeletal structures are unremarkable. IMPRESSION: No active disease. Electronically Signed   By: Prudencio Pair M.D.   On: 01/23/2020 01:26    Procedures Procedures (including critical care time)  This patients CHA2DS2-VASc Score and unadjusted Ischemic Stroke Rate (% per year) is equal to 3.2 % stroke rate/year from a score of 3  Above score calculated as 1 point each if present [CHF, HTN, DM, Vascular=MI/PAD/Aortic Plaque, Age if 65-74, or Female] Above score calculated as 2 points each if present [Age > 75, or Stroke/TIA/TE]  Medications Ordered in ED Medications - No data to display  ED Course  I have reviewed the triage vital signs and the nursing notes.  Pertinent labs & imaging results that were available during my care of the patient were reviewed by me and considered in my medical decision making (see chart for details).    MDM Rules/Calculators/A&P  69 y.o. F presenting to the ED with palpitations.  Arrives in AFIB w/RVR rate 120's-150's.  Given 10mg  diltiazem by EMS without change.  Denies new stressors, change in activity, illness, or excessive caffeine intake.  Onset around 8:15PM after taking night time meds.  Has been compliant with anticoagulation.  She is afebrile, non-toxic.  HR irregularly irregular, lungs clear, no distress.  BP stable.  Labs and CXR pending.  Plan for electric cardioversion, patient agreeable.  ChadsVasc of 3.  Labs grossly reassuring, trop negative. CXR clear.  While transitioning to zoll monitor for cardioversion, patient self converted.  Now in NSR, rate 70's.  She  has relief of palpitations currently.  Will monitor here.  Patient has been observed here for a while post conversion and has remained in NSR.  Remains without chest  pain/sob/palpitations.  VSS.  Feel she is stable for discharge home.  She has scheduled follow-up with cardiology on 02/09/20, will call for sooner appointment if needed.  Return here for any new/acute changes.  Final Clinical Impression(s) / ED Diagnoses Final diagnoses:  PAF (paroxysmal atrial fibrillation) [facility])    Rx / DC Orders ED Discharge Orders    None       Larene Pickett, PA-C 01/23/20 0426    Sherwood Gambler, MD 01/23/20 [phone]

## 2020-01-28 LAB — CUP PACEART REMOTE DEVICE CHECK
Date Time Interrogation Session: 20211005031514
Implantable Pulse Generator Implant Date: 20191016

## 2020-01-31 ENCOUNTER — Ambulatory Visit (INDEPENDENT_AMBULATORY_CARE_PROVIDER_SITE_OTHER): Payer: Medicare Other

## 2020-01-31 DIAGNOSIS — I482 Chronic atrial fibrillation, unspecified: Secondary | ICD-10-CM

## 2020-02-01 ENCOUNTER — Telehealth: Payer: Self-pay | Admitting: Internal Medicine

## 2020-02-01 NOTE — Telephone Encounter (Signed)
    Pt is returning Nicole [name]'s call. She said she received the call yesterday and was advised to call back.

## 2020-02-01 NOTE — Telephone Encounter (Signed)
Unable to locate why Otila Kluver called.  Called pt to see if Otila Kluver mentioned it in her message.  Pt states message just said to call back. Advised I will send message to Otila Kluver to follow up when she is back in the office.  Pt appreciative for call.

## 2020-02-02 NOTE — Telephone Encounter (Signed)
Spoke to the patient and let her know I'm not sure why she was called but I looked forward to meeting her at her up coming appointment with Dr. Rayann Heman.

## 2020-02-02 NOTE — Progress Notes (Signed)
Carelink Summary Report / Loop Recorder 

## 2020-02-09 ENCOUNTER — Ambulatory Visit (INDEPENDENT_AMBULATORY_CARE_PROVIDER_SITE_OTHER): Payer: Medicare Other | Admitting: Internal Medicine

## 2020-02-09 ENCOUNTER — Encounter: Payer: Self-pay | Admitting: Internal Medicine

## 2020-02-09 ENCOUNTER — Other Ambulatory Visit: Payer: Self-pay

## 2020-02-09 ENCOUNTER — Encounter: Payer: Self-pay | Admitting: *Deleted

## 2020-02-09 VITALS — BP 156/88 | HR 59 | Ht 68.0 in

## 2020-02-09 DIAGNOSIS — I480 Paroxysmal atrial fibrillation: Secondary | ICD-10-CM | POA: Diagnosis not present

## 2020-02-09 DIAGNOSIS — D6869 Other thrombophilia: Secondary | ICD-10-CM | POA: Diagnosis not present

## 2020-02-09 DIAGNOSIS — I4891 Unspecified atrial fibrillation: Secondary | ICD-10-CM

## 2020-02-09 DIAGNOSIS — R55 Syncope and collapse: Secondary | ICD-10-CM | POA: Diagnosis not present

## 2020-02-09 NOTE — Progress Notes (Signed)
PCP: Harvie Junior, MD   Primary EP: Dr Rayann Heman  Nicole [name] is a 69 y.o. female who presents today for routine electrophysiology followup.  Since last being seen in our clinic, the patient reports doing reasonably well.  She presented with sustained AF on 01/23/20 to the ED.  Most afib events are short and overall burden is low.  She feels that her afib is increasing.  Today, she denies symptoms of  chest pain, shortness of breath,  lower extremity edema, dizziness, presyncope, or syncope.  The patient is otherwise without complaint today.   Past Medical History:  Diagnosis Date   Anticoagulant long-term use 11/16/2014   Anxiety 10/06/2017   Arthritis 10/06/2017   knees bilateral   Asthma 11/16/2014   Esophageal reflux 11/16/2014   Essential hypertension 08/15/2015   pt denies   H/O: GI bleed 12/11/2016   Hyperlipidemia 11/17/2014   Migraine headache 11/16/2014   Nausea 11/16/2014   Pain in limb 11/16/2014   Panic disorder 11/16/2014   Paroxysmal atrial fibrillation ([place]) 11/17/2014   Swelling of joint 11/16/2014   Symptomatic anemia 11/24/2016   Tendonitis 11/16/2014   UTI (urinary tract infection) 12/19/2015   Past Surgical History:  Procedure Laterality Date   ABDOMINAL HYSTERECTOMY     CARDIOVERSION     CESAREAN SECTION     COLONOSCOPY     LOOP RECORDER INSERTION N/A 02/04/2018   Procedure: LOOP RECORDER INSERTION;  Surgeon: [name], MD;  Location: [facility];  Service: Cardiovascular;  Laterality: N/A;   ROTATOR CUFF REPAIR Left     ROS- all systems are reviewed and negatives except as per HPI above  Current Outpatient Medications  Medication Sig Dispense Refill   acetaminophen (TYLENOL) 500 MG tablet Take 500 mg by mouth every 6 (six) hours as needed for mild pain or headache.     butalbital-acetaminophen-caffeine (ESGIC) 50-325-40 MG tablet Take 1 tablet by mouth every 8 (eight) hours as needed for headache.     butorphanol  (STADOL) 10 MG/ML nasal spray Place 1 spray into the nose every 4 (four) hours as needed for headache.     clonazePAM (KLONOPIN) 1 MG tablet Take 1 mg by mouth 3 (three) times daily.      diltiazem (CARDIZEM) 30 MG tablet Take 1 Tablet Every 4 Hours As Needed For HR >100 45 tablet 1   dofetilide (TIKOSYN) 500 MCG capsule TAKE 1 CAPSULE BY MOUTH TWICE A DAY 180 capsule 3   ELIQUIS 5 MG TABS tablet TAKE 1 TABLET BY MOUTH TWICE A DAY 60 tablet 10   fluticasone-salmeterol (ADVAIR HFA) 115-21 MCG/ACT inhaler Inhale 2 puffs into the lungs 2 (two) times daily.     furosemide (LASIX) 20 MG tablet TAKE 1 TABLET BY MOUTH EVERY DAY AS NEEDED FOR EDEMA 90 tablet 2   potassium chloride SA (K-DUR) 20 MEQ tablet Take 1 tablet (20 mEq total) by mouth daily. 90 tablet 3   pravastatin (PRAVACHOL) 40 MG tablet Take 1 tablet by mouth daily.     promethazine (PHENERGAN) 25 MG tablet Take 25 mg by mouth every 6 (six) hours as needed for nausea or vomiting.     metoprolol succinate (TOPROL-XL) 50 MG 24 hr tablet Take 1 tablet (50 mg total) by mouth daily. Take with or immediately following a meal. 90 tablet 3   No current facility-administered medications for this visit.    Physical Exam: Vitals:   02/09/20 0941  BP: (!) 156/88  Pulse: (!) 59  SpO2: 96%  Height: 5\' 8"  (1.727 m)    GEN- The patient is well appearing, alert and oriented x 3 today.   Head- normocephalic, atraumatic Eyes-  Sclera clear, conjunctiva pink Ears- hearing intact Oropharynx- clear Lungs- Clear to ausculation bilaterally, normal work of breathing Heart- Regular rate and rhythm, no murmurs, rubs or gallops, PMI not laterally displaced GI- soft, NT, ND, + BS Extremities- no clubbing, cyanosis, or edema  Wt Readings from Last 3 Encounters:  01/23/20 195 lb (88.5 kg)  10/11/19 197 lb 6.4 oz (89.5 kg)  09/07/19 195 lb (88.5 kg)    EKG tracing ordered today is personally reviewed and shows sinus rhythm, QTc 449  msec  Assessment and Plan:  1. Paroxysmal atrial fibrillation/ atach Overall ILR burden 0.6% however afib events are clearly increasing. chads2vasc score is 3.  She is on eliquis She takes diltiazem prn for RVR She is on tikosyn and requires close follow-up on this medicine Qt is stable, recent labs reviewed The patient has symptomatic, recurrent paroxysmal atrial fibrillation, PACs and atach. she has failed medical therapy with tikosyn.  She is s/p prior ablation by Dr Ola Spurr.   Therapeutic strategies for afib including medicine and repeat ablation were discussed in detail with the patient today. Risk, benefits, and alternatives to EP study and radiofrequency ablation for afib were also discussed in detail today. These risks include but are not limited to stroke, bleeding, vascular damage, tamponade, perforation, damage to the esophagus, lungs, and other structures, pulmonary vein stenosis, worsening renal function, and death. The patient understands these risk and wishes to proceed.  We will therefore proceed with catheter ablation at the next available time.  Carto, ICE, anesthesia are requested for the procedure.  Will also obtain cardiac CT prior to the procedure to exclude LAA thrombus and further evaluate atrial anatomy.  We will obtain echo prior to ablation to further evaluate for structural changes related to afib.  2. Syncope Resolved No brady events on ILR  3. HTN Stable No change required today  4. Anemia resolved  [name] MD, [facility] 02/09/2020 10:20 AM

## 2020-02-09 NOTE — Patient Instructions (Signed)
Medication Instructions:  No Changes *If you need a refill on your cardiac medications before your next appointment, please call your pharmacy*   Lab Work: none If you have labs (blood work) drawn today and your tests are completely normal, you will receive your results only by: Marland Kitchen MyChart Message (if you have MyChart) OR . A paper copy in the mail If you have any lab test that is abnormal or we need to change your treatment, we will call you to review the results.   Testing/Procedures: Please schedule Echo Your physician has requested that you have an echocardiogram. Echocardiography is a painless test that uses sound waves to create images of your heart. It provides your doctor with information about the size and shape of your heart and how well your heart's chambers and valves are working. This procedure takes approximately one hour. There are no restrictions for this procedure.   Your physician has recommended that you have an ablation. Catheter ablation is a medical procedure used to treat some cardiac arrhythmias (irregular heartbeats). During catheter ablation, a long, thin, flexible tube is put into a blood vessel in your groin (upper thigh), or neck. This tube is called an ablation catheter. It is then guided to your heart through the blood vessel. Radio frequency waves destroy small areas of heart tissue where abnormal heartbeats may cause an arrhythmia to start. Please see the instruction sheet given to you today.     Follow-Up: At [facility], you and your health needs are our priority.  As part of our continuing mission to provide you with exceptional heart care, we have created designated Provider Care Teams.  These Care Teams include your primary Cardiologist (physician) and Advanced Practice Providers (APPs -  Physician Assistants and Nurse Practitioners) who all work together to provide you with the care you need, when you need it.  We recommend signing up for the patient  portal called "MyChart".  Sign up information is provided on this After Visit Summary.  MyChart is used to connect with patients for Virtual Visits (Telemedicine).  Patients are able to view lab/test results, encounter notes, upcoming appointments, etc.  Non-urgent messages can be sent to your provider as well.   To learn more about what you can do with MyChart, go to [email or web address].    Your next appointment:  Post ablation  4 week(s)  The format for your next appointment:   In Person  Provider:   You will follow up in the [facility] located at [facility]. Your provider will be: Roderic Palau, NP or Clint R. Fenton, PA-C   Other Instructions  Cardiac Ablation Cardiac ablation is a procedure to disable (ablate) a small amount of heart tissue in very specific places. The heart has many electrical connections. Sometimes these connections are abnormal and can cause the heart to beat very fast or irregularly. Ablating some of the problem areas can improve the heart rhythm or return it to normal. Ablation may be done for people who:  Have Wolff-Parkinson-White syndrome.  Have fast heart rhythms (tachycardia).  Have taken medicines for an abnormal heart rhythm (arrhythmia) that were not effective or caused side effects.  Have a high-risk heartbeat that may be life-threatening. During the procedure, a small incision is made in the neck or the groin, and a long, thin, flexible tube (catheter) is inserted into the incision and moved to the heart. Small devices (electrodes) on the tip of the catheter will send out electrical currents. A type  of X-ray (fluoroscopy) will be used to help guide the catheter and to provide images of the heart. Tell a health care provider about:  Any allergies you have.  All medicines you are taking, including vitamins, herbs, eye drops, creams, and over-the-counter medicines.  Any problems you or family members have had with  anesthetic medicines.  Any blood disorders you have.  Any surgeries you have had.  Any medical conditions you have, such as kidney failure.  Whether you are pregnant or may be pregnant. What are the risks? Generally, this is a safe procedure. However, problems may occur, including:  Infection.  Bruising and bleeding at the catheter insertion site.  Bleeding into the chest, especially into the sac that surrounds the heart. This is a serious complication.  Stroke or blood clots.  Damage to other structures or organs.  Allergic reaction to medicines or dyes.  Need for a permanent pacemaker if the normal electrical system is damaged. A pacemaker is a small computer that sends electrical signals to the heart and helps your heart beat normally.  The procedure not being fully effective. This may not be recognized until months later. Repeat ablation procedures are sometimes required. What happens before the procedure?  Follow instructions from your health care provider about eating or drinking restrictions.  Ask your health care provider about: ? Changing or stopping your regular medicines. This is especially important if you are taking diabetes medicines or blood thinners. ? Taking medicines such as aspirin and ibuprofen. These medicines can thin your blood. Do not take these medicines before your procedure if your health care provider instructs you not to.  Plan to have someone take you home from the hospital or clinic.  If you will be going home right after the procedure, plan to have someone with you for 24 hours. What happens during the procedure?  To lower your risk of infection: ? Your health care team will wash or sanitize their hands. ? Your skin will be washed with soap. ? Hair may be removed from the incision area.  An IV tube will be inserted into one of your veins.  You will be given a medicine to help you relax (sedative).  The skin on your neck or groin will be  numbed.  An incision will be made in your neck or your groin.  A needle will be inserted through the incision and into a large vein in your neck or groin.  A catheter will be inserted into the needle and moved to your heart.  Dye may be injected through the catheter to help your surgeon see the area of the heart that needs treatment.  Electrical currents will be sent from the catheter to ablate heart tissue in desired areas. There are three types of energy that may be used to ablate heart tissue: ? Heat (radiofrequency energy). ? Laser energy. ? Extreme cold (cryoablation).  When the necessary tissue has been ablated, the catheter will be removed.  Pressure will be held on the catheter insertion area to prevent excessive bleeding.  A bandage (dressing) will be placed over the catheter insertion area. The procedure may vary among health care providers and hospitals. What happens after the procedure?  Your blood pressure, heart rate, breathing rate, and blood oxygen level will be monitored until the medicines you were given have worn off.  Your catheter insertion area will be monitored for bleeding. You will need to lie still for a few hours to ensure that you do not  bleed from the catheter insertion area.  Do not drive for 24 hours or as long as directed by your health care provider. Summary  Cardiac ablation is a procedure to disable (ablate) a small amount of heart tissue in very specific places. Ablating some of the problem areas can improve the heart rhythm or return it to normal.  During the procedure, electrical currents will be sent from the catheter to ablate heart tissue in desired areas. This information is not intended to replace advice given to you by your health care provider. Make sure you discuss any questions you have with your health care provider. Document Revised: 09/29/2017 Document Reviewed: 02/26/2016 Elsevier Patient Education  [place].

## 2020-02-21 ENCOUNTER — Other Ambulatory Visit: Payer: Medicare Other | Admitting: *Deleted

## 2020-02-21 ENCOUNTER — Other Ambulatory Visit: Payer: Self-pay

## 2020-02-21 DIAGNOSIS — I480 Paroxysmal atrial fibrillation: Secondary | ICD-10-CM | POA: Diagnosis not present

## 2020-02-21 DIAGNOSIS — R55 Syncope and collapse: Secondary | ICD-10-CM

## 2020-02-21 DIAGNOSIS — D6869 Other thrombophilia: Secondary | ICD-10-CM

## 2020-02-21 DIAGNOSIS — I4891 Unspecified atrial fibrillation: Secondary | ICD-10-CM | POA: Diagnosis not present

## 2020-02-22 LAB — CBC WITH DIFFERENTIAL/PLATELET
Basophils Absolute: 0 10*3/uL (ref 0.0–0.2)
Basos: 1 %
EOS (ABSOLUTE): 0.2 10*3/uL (ref 0.0–0.4)
Eos: 2 %
Hematocrit: 37.2 % (ref 34.0–46.6)
Hemoglobin: 12.7 g/dL (ref 11.1–15.9)
Immature Grans (Abs): 0 10*3/uL (ref 0.0–0.1)
Immature Granulocytes: 0 %
Lymphocytes Absolute: 2 10*3/uL (ref 0.7–3.1)
Lymphs: 32 %
MCH: 29.9 pg (ref 26.6–33.0)
MCHC: 34.1 g/dL (ref 31.5–35.7)
MCV: 88 fL (ref 79–97)
Monocytes Absolute: 0.8 10*3/uL (ref 0.1–0.9)
Monocytes: 12 %
Neutrophils Absolute: 3.2 10*3/uL (ref 1.4–7.0)
Neutrophils: 53 %
Platelets: 287 10*3/uL (ref 150–450)
RBC: 4.25 x10E6/uL (ref 3.77–5.28)
RDW: 12.6 % (ref 11.7–15.4)
WBC: 6.1 10*3/uL (ref 3.4–10.8)

## 2020-02-22 LAB — BASIC METABOLIC PANEL
BUN/Creatinine Ratio: 12 (ref 12–28)
BUN: 10 mg/dL (ref 8–27)
CO2: 22 mmol/L (ref 20–29)
Calcium: 9.8 mg/dL (ref 8.7–10.3)
Chloride: 104 mmol/L (ref 96–106)
Creatinine, Ser: 0.81 mg/dL (ref 0.57–1.00)
GFR calc Af Amer: 86 mL/min/{1.73_m2} (ref >59)
GFR calc non Af Amer: 74 mL/min/{1.73_m2} (ref >59)
Glucose: 97 mg/dL (ref 65–99)
Potassium: 4.6 mmol/L (ref 3.5–5.2)
Sodium: 138 mmol/L (ref 134–144)

## 2020-02-24 ENCOUNTER — Ambulatory Visit (HOSPITAL_COMMUNITY): Payer: Medicare Other | Attending: Cardiology

## 2020-02-24 ENCOUNTER — Other Ambulatory Visit: Payer: Self-pay

## 2020-02-24 DIAGNOSIS — R55 Syncope and collapse: Secondary | ICD-10-CM | POA: Insufficient documentation

## 2020-02-24 DIAGNOSIS — I4891 Unspecified atrial fibrillation: Secondary | ICD-10-CM | POA: Diagnosis not present

## 2020-02-24 DIAGNOSIS — I480 Paroxysmal atrial fibrillation: Secondary | ICD-10-CM | POA: Diagnosis not present

## 2020-02-24 DIAGNOSIS — D6869 Other thrombophilia: Secondary | ICD-10-CM | POA: Insufficient documentation

## 2020-02-24 LAB — ECHOCARDIOGRAM COMPLETE
Area-P 1/2: 5.27 cm2
S' Lateral: 2.1 cm

## 2020-02-24 NOTE — Telephone Encounter (Signed)
Spoke to the patient and let her know that Dr. Rayann Heman still needed to review the Echo himself but that he just needed an updated one before her procedure and we will continue as planned with the ablation.  She verbalized understanding.

## 2020-02-27 LAB — CUP PACEART REMOTE DEVICE CHECK
Date Time Interrogation Session: 20211107023521
Implantable Pulse Generator Implant Date: 20191016

## 2020-02-29 ENCOUNTER — Emergency Department (HOSPITAL_COMMUNITY): Payer: Medicare Other

## 2020-02-29 ENCOUNTER — Emergency Department (HOSPITAL_COMMUNITY)
Admission: EM | Admit: 2020-02-29 | Discharge: 2020-02-29 | Disposition: A | Discharge status: Home / Self Care | Payer: Medicare Other | Attending: Emergency Medicine | Admitting: Emergency Medicine

## 2020-02-29 ENCOUNTER — Other Ambulatory Visit: Payer: Self-pay

## 2020-02-29 DIAGNOSIS — R079 Chest pain, unspecified: Secondary | ICD-10-CM | POA: Diagnosis not present

## 2020-02-29 DIAGNOSIS — I10 Essential (primary) hypertension: Secondary | ICD-10-CM | POA: Diagnosis not present

## 2020-02-29 DIAGNOSIS — R0602 Shortness of breath: Secondary | ICD-10-CM | POA: Diagnosis not present

## 2020-02-29 DIAGNOSIS — J45909 Unspecified asthma, uncomplicated: Secondary | ICD-10-CM | POA: Insufficient documentation

## 2020-02-29 DIAGNOSIS — R000 Tachycardia, unspecified: Secondary | ICD-10-CM | POA: Diagnosis not present

## 2020-02-29 DIAGNOSIS — Z7901 Long term (current) use of anticoagulants: Secondary | ICD-10-CM | POA: Diagnosis not present

## 2020-02-29 DIAGNOSIS — Z79899 Other long term (current) drug therapy: Secondary | ICD-10-CM | POA: Insufficient documentation

## 2020-02-29 DIAGNOSIS — I499 Cardiac arrhythmia, unspecified: Secondary | ICD-10-CM | POA: Diagnosis not present

## 2020-02-29 DIAGNOSIS — I4891 Unspecified atrial fibrillation: Secondary | ICD-10-CM | POA: Insufficient documentation

## 2020-02-29 DIAGNOSIS — R0789 Other chest pain: Secondary | ICD-10-CM | POA: Diagnosis not present

## 2020-02-29 LAB — BASIC METABOLIC PANEL
Anion gap: 9 (ref 5–15)
BUN: 8 mg/dL (ref 8–23)
CO2: 23 mmol/L (ref 22–32)
Calcium: 9.2 mg/dL (ref 8.9–10.3)
Chloride: 107 mmol/L (ref 98–111)
Creatinine, Ser: 0.86 mg/dL (ref 0.44–1.00)
GFR, Estimated: >60 mL/min (ref >60)
Glucose, Bld: 100 mg/dL — ABNORMAL HIGH (ref 70–99)
Potassium: 4 mmol/L (ref 3.5–5.1)
Sodium: 139 mmol/L (ref 135–145)

## 2020-02-29 LAB — CBC
HCT: 39.1 % (ref 36.0–46.0)
Hemoglobin: 12.4 g/dL (ref 12.0–15.0)
MCH: 29.2 pg (ref 26.0–34.0)
MCHC: 31.7 g/dL (ref 30.0–36.0)
MCV: 92 fL (ref 80.0–100.0)
Platelets: 235 10*3/uL (ref 150–400)
RBC: 4.25 MIL/uL (ref 3.87–5.11)
RDW: 12.8 % (ref 11.5–15.5)
WBC: 6.1 10*3/uL (ref 4.0–10.5)
nRBC: 0 % (ref 0.0–0.2)

## 2020-02-29 LAB — TSH: TSH: 1.13 u[IU]/mL (ref 0.350–4.500)

## 2020-02-29 LAB — MAGNESIUM: Magnesium: 2.2 mg/dL (ref 1.7–2.4)

## 2020-02-29 NOTE — ED Provider Notes (Signed)
[facility] EMERGENCY DEPARTMENT Provider Note   CSN: [id number] Arrival date & time: 02/29/20  1305     History Chief Complaint  Patient presents with  . Atrial Fibrillation  . Shortness of Breath    Nicole [name] is a 69 y.o. female.  Pt presents to the ED today with afib with rvr.  Pt has a hx of paroxysmal atrial fib.  She is followed by Dr. Rayann Heman in the afib clinic.  Pt's afib events have been increasing despite tikosyn and metoprolol.  She is scheduled for an ablation on 11/23.  She has been compliant with her Eliquis.  She takes diltiazem 30 mg prn afib.  She felt herself go into afib this morning.  HR up to 185.  She took 30 mg of cardizem which did not help.  She took an additional 30 mg of dilt 2 hours after the first.  That still did not work, so she called EMS.  HR was in the 130s for EMS.  They gave her 10 mg cardizem IV which pt her back into NSR.  Pt feels much better now.   CHA2DS2/VAS Stroke Risk Points  Current as of 11 minutes ago     3 >= 2 Points: High Risk  1 - 1.99 Points: Medium Risk  0 Points: Low Risk    Last Change: N/A      Details    This score determines the patient's risk of having a stroke if the  patient has atrial fibrillation.       Points Metrics  0 Has Congestive Heart Failure:  No    Current as of 11 minutes ago  0 Has Vascular Disease:  No    Current as of 11 minutes ago  1 Has Hypertension:  Yes    Current as of 11 minutes ago  1 Age:  23    Current as of 11 minutes ago  0 Has Diabetes:  No    Current as of 11 minutes ago  0 Had Stroke:  No  Had TIA:  No  Had thromboembolism:  No    Current as of 11 minutes ago  1 Female:  Yes    Current as of 11 minutes ago           Past Medical History:  Diagnosis Date  . Anticoagulant long-term use 11/16/2014  . Anxiety 10/06/2017  . Arthritis 10/06/2017   knees bilateral  . Asthma 11/16/2014  . Esophageal reflux 11/16/2014  . Essential hypertension 08/15/2015    pt denies  . H/O: GI bleed 12/11/2016  . Hyperlipidemia 11/17/2014  . Migraine headache 11/16/2014  . Nausea 11/16/2014  . Pain in limb 11/16/2014  . Panic disorder 11/16/2014  . Paroxysmal atrial fibrillation ([place]) 11/17/2014  . Swelling of joint 11/16/2014  . Symptomatic anemia 11/24/2016  . Tendonitis 11/16/2014  . UTI (urinary tract infection) 12/19/2015    Patient Active Problem List   Diagnosis Date Noted  . Secondary hypercoagulable state ([place]) 03/08/2019  . Atrial flutter with rapid ventricular response ([place]) 12/31/2017  . Anxiety 10/06/2017  . Chest pain 10/06/2017  . Arthritis 10/06/2017  . H/O: GI bleed 12/11/2016  . GI bleed 11/24/2016  . Symptomatic anemia 11/24/2016  . Generalized weakness 07/05/2016  . Atrial fibrillation, chronic ([place]) 07/05/2016  . Chest discomfort 12/19/2015  . UTI (urinary tract infection) 12/19/2015  . Essential hypertension 08/15/2015  . Hyperlipidemia 11/17/2014  . Paroxysmal atrial fibrillation ([place]) 11/17/2014  . Anticoagulant long-term use 11/16/2014  .  Asthma 11/16/2014  . Esophageal reflux 11/16/2014  . Migraine headache 11/16/2014  . Nausea 11/16/2014  . Pain in limb 11/16/2014  . Panic disorder 11/16/2014  . Swelling of joint 11/16/2014  . Tendonitis 11/16/2014    Past Surgical History:  Procedure Laterality Date  . ABDOMINAL HYSTERECTOMY    . CARDIOVERSION    . CESAREAN SECTION    . COLONOSCOPY    . LOOP RECORDER INSERTION N/A 02/04/2018   Procedure: LOOP RECORDER INSERTION;  Surgeon: [name], MD;  Location: [facility];  Service: Cardiovascular;  Laterality: N/A;  . ROTATOR CUFF REPAIR Left      OB History   No obstetric history on file.     Family History  Problem Relation Age of Onset  . Heart attack Mother   . Heart disease Mother   . Heart disease Sister   . Heart disease Sister   . Heart disease Sister   . Heart disease Sister   . Heart disease Sister     Social History   Tobacco Use  .  Smoking status: Never Smoker  . Smokeless tobacco: Never Used  Vaping Use  . Vaping Use: Never used  Substance Use Topics  . Alcohol use: Never  . Drug use: Never    Home Medications Prior to Admission medications   Medication Sig Start Date End Date Taking? Authorizing Provider  acetaminophen (TYLENOL) 500 MG tablet Take 500 mg by mouth every 6 (six) hours as needed for mild pain or headache.   Yes [provider]  butalbital-acetaminophen-caffeine (ESGIC) 50-325-40 MG tablet Take 1 tablet by mouth every 8 (eight) hours as needed for headache or migraine.    Yes [provider]  butorphanol (STADOL) 10 MG/ML nasal spray Place 1 spray into the nose every 4 (four) hours as needed for headache or migraine.    Yes [provider]  cetirizine (ZYRTEC) 10 MG tablet Take 10 mg by mouth daily as needed for allergies or rhinitis.   Yes [provider]  clonazePAM (KLONOPIN) 1 MG tablet Take 1 mg by mouth 3 (three) times daily.    Yes [provider]  diltiazem (CARDIZEM) 30 MG tablet Take 1 Tablet Every 4 Hours As Needed For HR >100 Patient taking differently: Take 60 mg by mouth every 4 (four) hours as needed (for a heart rate >100).  08/31/19  Yes Fenton, Clint R, PA  dofetilide (TIKOSYN) 500 MCG capsule TAKE 1 CAPSULE BY MOUTH TWICE A DAY Patient taking differently: Take 500 mcg by mouth every 12 (twelve) hours.  12/17/19  Yes Allred, Jeneen Rinks, MD  ELIQUIS 5 MG TABS tablet TAKE 1 TABLET BY MOUTH TWICE A DAY Patient taking differently: Take 5 mg by mouth every 12 (twelve) hours.  10/04/19  Yes Allred, Jeneen Rinks, MD  fluticasone-salmeterol (ADVAIR HFA) [phone] MCG/ACT inhaler Inhale 2 puffs into the lungs 2 (two) times daily.   Yes [provider]  furosemide (LASIX) 20 MG tablet TAKE 1 TABLET BY MOUTH EVERY DAY AS NEEDED FOR EDEMA Patient taking differently: Take 20 mg by mouth daily as needed for edema.  01/04/20  Yes Allred, Jeneen Rinks, MD  metoprolol  succinate (TOPROL-XL) 50 MG 24 hr tablet Take 1 tablet (50 mg total) by mouth daily. Take with or immediately following a meal. Patient taking differently: Take 50 mg by mouth at bedtime. Take with or immediately following a meal. 09/07/19 02/29/20 Yes Tillery, Satira Mccallum, PA-C  potassium chloride SA (K-DUR) 20 MEQ tablet Take 1  tablet (20 mEq total) by mouth daily. Patient taking differently: Take 20 mEq by mouth daily in the afternoon.  12/14/18  Yes Sherran Needs, NP  pravastatin (PRAVACHOL) 40 MG tablet Take 40 mg by mouth at bedtime.  11/30/18  Yes [provider]  promethazine (PHENERGAN) 25 MG tablet Take 25 mg by mouth every 6 (six) hours as needed for nausea or vomiting (from migraines).    Yes [provider]  VENTOLIN HFA 108 (90 Base) MCG/ACT inhaler Inhale 2 puffs into the lungs every 6 (six) hours as needed for wheezing or shortness of breath.   Yes [provider]    Allergies    Pneumococcal vaccines, Shingrix [zoster vac recomb adjuvanted], Other, Meperidine, and Prednisone  Review of Systems   Review of Systems  Cardiovascular: Positive for palpitations.  All other systems reviewed and are negative.   Physical Exam Updated Vital Signs BP 132/80   Pulse (!) 58   Temp 98 F (36.7 C) (Oral)   Resp 18   SpO2 100%   Physical Exam Vitals and nursing note reviewed.  Constitutional:      Appearance: She is well-developed.  HENT:     Head: Normocephalic and atraumatic.     Mouth/Throat:     Mouth: Mucous membranes are moist.     Pharynx: Oropharynx is clear.  Eyes:     Extraocular Movements: Extraocular movements intact.     Pupils: Pupils are equal, round, and reactive to light.  Cardiovascular:     Rate and Rhythm: Normal rate and regular rhythm.  Pulmonary:     Effort: Pulmonary effort is normal.     Breath sounds: Normal breath sounds.  Abdominal:     General: Bowel sounds are normal.     Palpations: Abdomen is soft.   Musculoskeletal:        General: Normal range of motion.     Cervical back: Normal range of motion and neck supple.  Skin:    General: Skin is warm.     Capillary Refill: Capillary refill takes less than 2 seconds.  Neurological:     General: No focal deficit present.     Mental Status: She is alert and oriented to person, place, and time.  Psychiatric:        Mood and Affect: Mood normal.        Behavior: Behavior normal.     ED Results / Procedures / Treatments   Labs (all labs ordered are listed, but only abnormal results are displayed) Labs Reviewed  BASIC METABOLIC PANEL - Abnormal; Notable for the following components:      Result Value   Glucose, Bld 100 (*)    All other components within normal limits  MAGNESIUM  CBC  TSH    EKG EKG Interpretation  Date/Time:  Tuesday February 29 2020 13:27:46 EST Ventricular Rate:  67 PR Interval:    QRS Duration: 91 QT Interval:  419 QTC Calculation: 443 R Axis:   41 Text Interpretation: Sinus rhythm No significant change since last tracing Confirmed by Isla Pence [phone]) on 02/29/2020 2:23:09 PM   Radiology DG Chest Port 1 View  Result Date: 02/29/2020 CLINICAL DATA:  Shortness of breath and atrial fibrillation EXAM: PORTABLE CHEST 1 VIEW COMPARISON:  January 23, 2020 FINDINGS: Lungs are clear. Heart size and pulmonary vascularity are normal. No adenopathy. There is a loop recorder on the left. No bone lesions. IMPRESSION: Lungs clear.  Cardiac silhouette normal. Electronically Signed   By: Gwyndolyn Saxon  Jasmine December III M.D.   On: 02/29/2020 13:54    Procedures Procedures (including critical care time)  Medications Ordered in ED Medications - No data to display  ED Course  I have reviewed the triage vital signs and the nursing notes.  Pertinent labs & imaging results that were available during my care of the patient were reviewed by me and considered in my medical decision making (see chart for details).    MDM  Rules/Calculators/A&P                         Pt has remained in NSR while here.  Pt d/w Dr. Rayann Heman who recommends keeping appointment for ablation on 11/23.  Return if worse.  Final Clinical Impression(s) / ED Diagnoses Final diagnoses:  Atrial fibrillation with RVR [facility])    Rx / DC Orders ED Discharge Orders    None       Isla Pence, MD 02/29/20 1526

## 2020-02-29 NOTE — ED Notes (Signed)
X-ray at bedside

## 2020-02-29 NOTE — ED Notes (Signed)
MD at bedside. 

## 2020-02-29 NOTE — ED Triage Notes (Signed)
BIB EMS for palpitations not relieved by extra 30 mg of cardizem po. Pt reported an hour of palpitations and SOB. Took her 30 mg cardizem then another 30mg  but did not help. EMS gave her 10mg  Cardizem IV which brought HR form 130s to 70-100 bpm and BP from 180/100 to 118/80. Pt is currently resting comfortably,

## 2020-03-04 ENCOUNTER — Other Ambulatory Visit (HOSPITAL_COMMUNITY): Payer: Self-pay | Admitting: Nurse Practitioner

## 2020-03-06 ENCOUNTER — Ambulatory Visit (INDEPENDENT_AMBULATORY_CARE_PROVIDER_SITE_OTHER): Payer: Medicare Other

## 2020-03-06 DIAGNOSIS — R55 Syncope and collapse: Secondary | ICD-10-CM | POA: Diagnosis not present

## 2020-03-07 ENCOUNTER — Telehealth (HOSPITAL_COMMUNITY): Payer: Self-pay | Admitting: Emergency Medicine

## 2020-03-07 NOTE — Telephone Encounter (Signed)
Reaching out to patient to offer assistance regarding upcoming cardiac imaging study; pt verbalizes understanding of appt date/time, parking situation and where to check in, pre-test NPO status and medications ordered, and verified current allergies; name and call back number provided for further questions should they arise Nicole Bond RN [facility] [phone] office [phone] cell   Pt verbalizes understanding to take meds aside from lasix and zyrtec.  Nicole [name]

## 2020-03-08 ENCOUNTER — Other Ambulatory Visit: Payer: Self-pay

## 2020-03-08 ENCOUNTER — Ambulatory Visit (HOSPITAL_COMMUNITY)
Admission: RE | Admit: 2020-03-08 | Discharge: 2020-03-08 | Disposition: A | Discharge status: Home / Self Care | Payer: Medicare Other | Source: Ambulatory Visit | Attending: Internal Medicine | Admitting: Internal Medicine

## 2020-03-08 DIAGNOSIS — I4891 Unspecified atrial fibrillation: Secondary | ICD-10-CM | POA: Diagnosis not present

## 2020-03-08 MED ORDER — IOHEXOL 350 MG/ML SOLN
80.0000 mL | Freq: Once | INTRAVENOUS | Status: AC | PRN
  Administered 2020-03-08: 80 mL via INTRAVENOUS

## 2020-03-08 NOTE — Progress Notes (Signed)
Carelink Summary Report / Loop Recorder 

## 2020-03-13 ENCOUNTER — Other Ambulatory Visit (HOSPITAL_COMMUNITY)
Admission: RE | Admit: 2020-03-13 | Discharge: 2020-03-13 | Disposition: A | Discharge status: Home / Self Care | Payer: Medicare Other | Source: Ambulatory Visit | Attending: Internal Medicine | Admitting: Internal Medicine

## 2020-03-13 DIAGNOSIS — Z01812 Encounter for preprocedural laboratory examination: Secondary | ICD-10-CM | POA: Insufficient documentation

## 2020-03-13 DIAGNOSIS — Z20822 Contact with and (suspected) exposure to covid-19: Secondary | ICD-10-CM | POA: Insufficient documentation

## 2020-03-13 LAB — SARS CORONAVIRUS 2 (TAT 6-24 HRS): SARS Coronavirus 2: NEGATIVE

## 2020-03-13 NOTE — Progress Notes (Signed)
Instructed patient on the following items: Arrival time 0830 Nothing to eat or drink after midnight No meds AM of procedure Responsible person to drive you home and stay with you for 24 hrs  Have you missed any doses of anti-coagulant Eliquis- hasn't missed any doses   

## 2020-03-14 ENCOUNTER — Ambulatory Visit (HOSPITAL_COMMUNITY): Payer: Medicare Other | Admitting: Certified Registered Nurse Anesthetist

## 2020-03-14 ENCOUNTER — Ambulatory Visit (HOSPITAL_COMMUNITY)
Admission: RE | Admit: 2020-03-14 | Discharge: 2020-03-14 | Disposition: A | Discharge status: Home / Self Care | Payer: Medicare Other | Attending: Internal Medicine | Admitting: Internal Medicine

## 2020-03-14 ENCOUNTER — Other Ambulatory Visit: Payer: Self-pay

## 2020-03-14 ENCOUNTER — Encounter (HOSPITAL_COMMUNITY): Payer: Self-pay | Admitting: Internal Medicine

## 2020-03-14 ENCOUNTER — Encounter (HOSPITAL_COMMUNITY)
Admission: RE | Disposition: A | Discharge status: Home / Self Care | Payer: Self-pay | Source: Home / Self Care | Attending: Internal Medicine

## 2020-03-14 DIAGNOSIS — I4892 Unspecified atrial flutter: Secondary | ICD-10-CM | POA: Diagnosis not present

## 2020-03-14 DIAGNOSIS — Z79899 Other long term (current) drug therapy: Secondary | ICD-10-CM | POA: Insufficient documentation

## 2020-03-14 DIAGNOSIS — Z7901 Long term (current) use of anticoagulants: Secondary | ICD-10-CM | POA: Insufficient documentation

## 2020-03-14 DIAGNOSIS — I480 Paroxysmal atrial fibrillation: Secondary | ICD-10-CM | POA: Diagnosis not present

## 2020-03-14 DIAGNOSIS — I484 Atypical atrial flutter: Secondary | ICD-10-CM | POA: Diagnosis not present

## 2020-03-14 DIAGNOSIS — I482 Chronic atrial fibrillation, unspecified: Secondary | ICD-10-CM | POA: Diagnosis not present

## 2020-03-14 DIAGNOSIS — F419 Anxiety disorder, unspecified: Secondary | ICD-10-CM | POA: Diagnosis not present

## 2020-03-14 HISTORY — PX: ATRIAL FIBRILLATION ABLATION: EP1191

## 2020-03-14 LAB — POCT ACTIVATED CLOTTING TIME: Activated Clotting Time: 241 seconds

## 2020-03-14 SURGERY — ATRIAL FIBRILLATION ABLATION
Anesthesia: General

## 2020-03-14 MED ORDER — HEPARIN SODIUM (PORCINE) 1000 UNIT/ML IJ SOLN
INTRAMUSCULAR | Status: DC | PRN
  Administered 2020-03-14: 4000 [IU] via INTRAVENOUS

## 2020-03-14 MED ORDER — DEXAMETHASONE SODIUM PHOSPHATE 10 MG/ML IJ SOLN
INTRAMUSCULAR | Status: DC | PRN
  Administered 2020-03-14: 5 mg via INTRAVENOUS

## 2020-03-14 MED ORDER — SUGAMMADEX SODIUM 200 MG/2ML IV SOLN
INTRAVENOUS | Status: DC | PRN
  Administered 2020-03-14: 200 mg via INTRAVENOUS

## 2020-03-14 MED ORDER — ONDANSETRON HCL 4 MG/2ML IJ SOLN
INTRAMUSCULAR | Status: DC | PRN
  Administered 2020-03-14: 4 mg via INTRAVENOUS

## 2020-03-14 MED ORDER — PANTOPRAZOLE SODIUM 40 MG PO TBEC: 40.0000 mg | DELAYED_RELEASE_TABLET | Freq: Every day | ORAL | 0 refills | Status: DC

## 2020-03-14 MED ORDER — ADENOSINE 6 MG/2ML IV SOLN
INTRAVENOUS | Status: AC
  Filled 2020-03-14: qty 4

## 2020-03-14 MED ORDER — PROTAMINE SULFATE 10 MG/ML IV SOLN
INTRAVENOUS | Status: DC | PRN
  Administered 2020-03-14: 40 mg via INTRAVENOUS

## 2020-03-14 MED ORDER — LIDOCAINE 2% (20 MG/ML) 5 ML SYRINGE
INTRAMUSCULAR | Status: DC | PRN
  Administered 2020-03-14: 40 mg via INTRAVENOUS

## 2020-03-14 MED ORDER — ROCURONIUM BROMIDE 10 MG/ML (PF) SYRINGE
PREFILLED_SYRINGE | INTRAVENOUS | Status: DC | PRN
  Administered 2020-03-14: 60 mg via INTRAVENOUS

## 2020-03-14 MED ORDER — SODIUM CHLORIDE 0.9% FLUSH: 3.0000 mL | INTRAVENOUS | Status: DC | PRN

## 2020-03-14 MED ORDER — HEPARIN (PORCINE) IN NACL 1000-0.9 UT/500ML-% IV SOLN
INTRAVENOUS | Status: AC
  Filled 2020-03-14: qty 500

## 2020-03-14 MED ORDER — HEPARIN SODIUM (PORCINE) 1000 UNIT/ML IJ SOLN
INTRAMUSCULAR | Status: AC
  Filled 2020-03-14: qty 2

## 2020-03-14 MED ORDER — SODIUM CHLORIDE 0.9 % IV SOLN: 250.0000 mL | INTRAVENOUS | Status: DC | PRN

## 2020-03-14 MED ORDER — HEPARIN (PORCINE) IN NACL 1000-0.9 UT/500ML-% IV SOLN
INTRAVENOUS | Status: DC | PRN
  Administered 2020-03-14: 500 mL

## 2020-03-14 MED ORDER — ONDANSETRON HCL 4 MG/2ML IJ SOLN: 4.0000 mg | Freq: Four times a day (QID) | INTRAMUSCULAR | Status: DC | PRN

## 2020-03-14 MED ORDER — ADENOSINE 6 MG/2ML IV SOLN
INTRAVENOUS | Status: DC | PRN
  Administered 2020-03-14: 12 mg via INTRAVENOUS

## 2020-03-14 MED ORDER — ISOPROTERENOL HCL 0.2 MG/ML IJ SOLN
INTRAVENOUS | Status: DC | PRN
  Administered 2020-03-14: 20 ug/min via INTRAVENOUS

## 2020-03-14 MED ORDER — SODIUM CHLORIDE 0.9% FLUSH: 3.0000 mL | Freq: Two times a day (BID) | INTRAVENOUS | Status: DC

## 2020-03-14 MED ORDER — PHENYLEPHRINE HCL-NACL 10-0.9 MG/250ML-% IV SOLN
INTRAVENOUS | Status: DC | PRN
  Administered 2020-03-14: 25 ug/min via INTRAVENOUS

## 2020-03-14 MED ORDER — FENTANYL CITRATE (PF) 250 MCG/5ML IJ SOLN
INTRAMUSCULAR | Status: DC | PRN
  Administered 2020-03-14: 100 ug via INTRAVENOUS

## 2020-03-14 MED ORDER — HYDROCODONE-ACETAMINOPHEN 5-325 MG PO TABS: 1.0000 | ORAL_TABLET | ORAL | Status: DC | PRN

## 2020-03-14 MED ORDER — ISOPROTERENOL HCL 0.2 MG/ML IJ SOLN
INTRAMUSCULAR | Status: AC
  Filled 2020-03-14: qty 5

## 2020-03-14 MED ORDER — ACETAMINOPHEN 325 MG PO TABS: 650.0000 mg | ORAL_TABLET | ORAL | Status: DC | PRN

## 2020-03-14 MED ORDER — SODIUM CHLORIDE 0.9 % IV SOLN: INTRAVENOUS | Status: DC

## 2020-03-14 MED ORDER — SUCCINYLCHOLINE CHLORIDE 20 MG/ML IJ SOLN
INTRAMUSCULAR | Status: DC | PRN
  Administered 2020-03-14: 120 mg via INTRAVENOUS

## 2020-03-14 MED ORDER — PROPOFOL 10 MG/ML IV BOLUS
INTRAVENOUS | Status: DC | PRN
  Administered 2020-03-14: 130 mg via INTRAVENOUS
  Administered 2020-03-14 (×3): 10 mg via INTRAVENOUS

## 2020-03-14 MED ORDER — HEPARIN SODIUM (PORCINE) 1000 UNIT/ML IJ SOLN
INTRAMUSCULAR | Status: DC | PRN
  Administered 2020-03-14: 14000 [IU] via INTRAVENOUS
  Administered 2020-03-14: 1000 [IU] via INTRAVENOUS

## 2020-03-14 SURGICAL SUPPLY — 18 items
BLANKET WARM UNDERBOD FULL ACC (MISCELLANEOUS) ×2 IMPLANT
CATH 8FR REPROCESSED SOUNDSTAR (CATHETERS) ×2 IMPLANT
CATH MAPPNG PENTARAY F 2-6-2MM (CATHETERS) ×1 IMPLANT
CATH SMTCH THERMOCOOL SF DF (CATHETERS) ×2 IMPLANT
CATH WEBSTER BI DIR CS D-F CRV (CATHETERS) ×2 IMPLANT
CLOSURE PERCLOSE PROSTYLE (VASCULAR PRODUCTS) ×6 IMPLANT
COVER SWIFTLINK CONNECTOR (BAG) ×2 IMPLANT
NEEDLE BAYLIS TRANSSEPTAL 71CM (NEEDLE) ×2 IMPLANT
PACK EP LATEX FREE (CUSTOM PROCEDURE TRAY) ×2
PACK EP LF (CUSTOM PROCEDURE TRAY) ×1 IMPLANT
PAD PRO RADIOLUCENT 2001M-C (PAD) ×2 IMPLANT
PATCH CARTO3 (PAD) ×2 IMPLANT
PENTARAY F 2-6-2MM (CATHETERS) ×2
SHEATH PINNACLE 7F 10CM (SHEATH) ×4 IMPLANT
SHEATH PINNACLE 9F 10CM (SHEATH) ×2 IMPLANT
SHEATH PROBE COVER 6X72 (BAG) ×2 IMPLANT
SHEATH SWARTZ TS SL2 63CM 8.5F (SHEATH) ×2 IMPLANT
TUBING SMART ABLATE COOLFLOW (TUBING) ×2 IMPLANT

## 2020-03-14 NOTE — Transfer of Care (Signed)
Immediate Anesthesia Transfer of Care Note  Patient: Nicole [name]  Procedure(s) Performed: ATRIAL FIBRILLATION ABLATION (N/A )  Patient Location: PACU and Cath Lab  Anesthesia Type:General  Level of Consciousness: awake, patient cooperative and responds to stimulation  Airway & Oxygen Therapy: Patient Spontanous Breathing and Patient connected to nasal cannula oxygen  Post-op Assessment: Report given to RN and Post -op Vital signs reviewed and stable  Post vital signs: Reviewed and stable  Last Vitals:  Vitals Value Taken Time  BP 139/67 03/14/20 1252  Temp 36.8 C 03/14/20 1253  Pulse 74 03/14/20 1255  Resp 14 03/14/20 1255  SpO2 98 % 03/14/20 1255  Vitals shown include unvalidated device data.  Last Pain:  Vitals:   03/14/20 1253  TempSrc: Temporal  PainSc: Asleep         Complications: No complications documented.

## 2020-03-14 NOTE — Progress Notes (Signed)
Pt ambulated without difficulty or bleeding.   Discharged home with husband who will drive and stay with pt x 24 hrs.  Dr Rayann Heman assessed pt and approved discharge

## 2020-03-14 NOTE — Anesthesia Procedure Notes (Signed)
Procedure Name: Intubation Date/Time: 03/14/2020 10:58 AM Performed by: Verdie Drown, CRNA Pre-anesthesia Checklist: Patient identified, Emergency Drugs available, Suction available, Patient being monitored and Timeout performed Patient Re-evaluated:Patient Re-evaluated prior to induction Oxygen Delivery Method: Circle system utilized Preoxygenation: Pre-oxygenation with 100% oxygen Induction Type: IV induction, Rapid sequence and Cricoid Pressure applied Laryngoscope Size: Glidescope, 3 and Mac Grade View: Grade I Tube type: Oral Tube size: 7.0 mm Number of attempts: 1 Airway Equipment and Method: Stylet and Video-laryngoscopy Placement Confirmation: ETT inserted through vocal cords under direct vision,  breath sounds checked- equal and bilateral and CO2 detector Secured at: 22 cm Tube secured with: Tape Dental Injury: Teeth and Oropharynx as per pre-operative assessment  Difficulty Due To: Difficulty was anticipated and Difficult Airway- due to dentition

## 2020-03-14 NOTE — H&P (Signed)
Nicole [name] is a 69 y.o. female who presents today for routine electrophysiology study and ablation for afib.  Since last being seen in our clinic, the patient reports doing reasonably well.  She presented with sustained AF on 01/23/20 to the ED.  Most afib events are short and overall burden is low.  She feels that her afib is increasing.  Today, she denies symptoms of  chest pain, shortness of breath,  lower extremity edema, dizziness, presyncope, or syncope.  The patient is otherwise without complaint today.       Past Medical History:  Diagnosis Date  . Anticoagulant long-term use 11/16/2014  . Anxiety 10/06/2017  . Arthritis 10/06/2017   knees bilateral  . Asthma 11/16/2014  . Esophageal reflux 11/16/2014  . Essential hypertension 08/15/2015   pt denies  . H/O: GI bleed 12/11/2016  . Hyperlipidemia 11/17/2014  . Migraine headache 11/16/2014  . Nausea 11/16/2014  . Pain in limb 11/16/2014  . Panic disorder 11/16/2014  . Paroxysmal atrial fibrillation ([place]) 11/17/2014  . Swelling of joint 11/16/2014  . Symptomatic anemia 11/24/2016  . Tendonitis 11/16/2014  . UTI (urinary tract infection) 12/19/2015        Past Surgical History:  Procedure Laterality Date  . ABDOMINAL HYSTERECTOMY    . CARDIOVERSION    . CESAREAN SECTION    . COLONOSCOPY    . LOOP RECORDER INSERTION N/A 02/04/2018   Procedure: LOOP RECORDER INSERTION;  Surgeon: [name], MD;  Location: [facility];  Service: Cardiovascular;  Laterality: N/A;  . ROTATOR CUFF REPAIR Left     ROS- all systems are reviewed and negatives except as per HPI above        Current Outpatient Medications  Medication Sig Dispense Refill  . acetaminophen (TYLENOL) 500 MG tablet Take 500 mg by mouth every 6 (six) hours as needed for mild pain or headache.    . butalbital-acetaminophen-caffeine (ESGIC) 50-325-40 MG tablet Take 1 tablet by mouth every 8 (eight) hours as needed for headache.    . butorphanol (STADOL)  10 MG/ML nasal spray Place 1 spray into the nose every 4 (four) hours as needed for headache.    . clonazePAM (KLONOPIN) 1 MG tablet Take 1 mg by mouth 3 (three) times daily.     Marland Kitchen diltiazem (CARDIZEM) 30 MG tablet Take 1 Tablet Every 4 Hours As Needed For HR >100 45 tablet 1  . dofetilide (TIKOSYN) 500 MCG capsule TAKE 1 CAPSULE BY MOUTH TWICE A DAY 180 capsule 3  . ELIQUIS 5 MG TABS tablet TAKE 1 TABLET BY MOUTH TWICE A DAY 60 tablet 10  . fluticasone-salmeterol (ADVAIR HFA) 115-21 MCG/ACT inhaler Inhale 2 puffs into the lungs 2 (two) times daily.    . furosemide (LASIX) 20 MG tablet TAKE 1 TABLET BY MOUTH EVERY DAY AS NEEDED FOR EDEMA 90 tablet 2  . potassium chloride SA (K-DUR) 20 MEQ tablet Take 1 tablet (20 mEq total) by mouth daily. 90 tablet 3  . pravastatin (PRAVACHOL) 40 MG tablet Take 1 tablet by mouth daily.    . promethazine (PHENERGAN) 25 MG tablet Take 25 mg by mouth every 6 (six) hours as needed for nausea or vomiting.    . metoprolol succinate (TOPROL-XL) 50 MG 24 hr tablet Take 1 tablet (50 mg total) by mouth daily. Take with or immediately following a meal. 90 tablet 3   No current facility-administered medications for this visit.    Physical Exam:     Vitals:   03/14/20  0857  BP: (!) 160/89  Pulse: 80  Temp: 99 F (37.2 C)  SpO2: 100%    GEN- The patient is well appearing, alert and oriented x 3 today.   Head- normocephalic, atraumatic Eyes-  Sclera clear, conjunctiva pink Ears- hearing intact Oropharynx- clear Lungs- Clear to ausculation bilaterally, normal work of breathing Heart- Regular rate and rhythm, no murmurs, rubs or gallops, PMI not laterally displaced GI- soft, NT, ND, + BS Extremities- no clubbing, cyanosis, or edema     Wt Readings from Last 3 Encounters:  01/23/20 195 lb (88.5 kg)  10/11/19 197 lb 6.4 oz (89.5 kg)  09/07/19 195 lb (88.5 kg)    EKG tracing ordered today is personally reviewed and shows sinus rhythm, QTc  449 msec  Assessment and Plan:  1. Paroxysmal atrial fibrillation/ atach Overall ILR burden 0.1% however afib events are clearly increasing. chads2vasc score is 3.  She is on eliquis She takes diltiazem prn for RVR She is on tikosyn and requires close follow-up on this medicine She is s/p prior ablation by Dr Ola Spurr.   Echo, cardiac CT reviewed with her today at length.  She reports compliance with eliquis without interruption.  Risk, benefits, and alternatives to EP study and radiofrequency ablation for afib were also discussed in detail today. These risks include but are not limited to stroke, bleeding, vascular damage, tamponade, perforation, damage to the esophagus, lungs, and other structures, pulmonary vein stenosis, worsening renal function, and death. The patient understands these risk and wishes to proceed.   [name] MD, [facility] 03/14/2020 10:36 AM

## 2020-03-14 NOTE — Discharge Instructions (Signed)
Post procedure care instructions No driving for 4 days. No lifting over 5 lbs for 1 week. No vigorous or sexual activity for 1 week. You may return to work/your usual activities on 03/22/2020. Keep procedure site clean & dry. If you notice increased pain, swelling, bleeding or pus, call/return!  You may shower after 24 hours, but no soaking in baths/hot tubs/pools for 1 week.     You have an appointment set up with the [facility].  Multiple studies have shown that being followed by a dedicated atrial fibrillation clinic in addition to the standard care you receive from your other physicians improves health. We believe that enrollment in the atrial fibrillation clinic will allow [place] to better care for you.   The phone number to the [facility] is [phone]. The clinic is staffed Monday through Friday from 8:30am to 5pm.  Parking Directions: The clinic is located in the Heart and Vascular Building connected to [facility]. 1)From [address] turn on to [address] and go to the 3rd entrance  (Heart and Vascular entrance) on the right.  Cardiac Ablation, Care After  This sheet gives you information about how to care for yourself after your procedure. Your health care provider may also give you more specific instructions. If you have problems or questions, contact your health care provider. What can I expect after the procedure? After the procedure, it is common to have:  Bruising around your puncture site.  Tenderness around your puncture site.  Skipped heartbeats.  Tiredness (fatigue).  Follow these instructions at home: Puncture site care   Follow instructions from your health care provider about how to take care of your puncture site. Make sure you: ? If present, leave stitches (sutures), skin glue, or adhesive strips in place. These skin closures may need to stay in place for up to 2 weeks. If adhesive strip edges start to loosen and curl  up, you may trim the loose edges. Do not remove adhesive strips completely unless your health care provider tells you to do that. ? If a large square bandage is present, this may be removed 24 hours after surgery.   Check your puncture site every day for signs of infection. Check for: ? Redness, swelling, or pain. ? Fluid or blood. If your puncture site starts to bleed, lie down on your back, apply firm pressure to the area, and contact your health care provider. ? Warmth. ? Pus or a bad smell. Driving  Do not drive for at least 4 days after your procedure or however long your health care provider recommends. (Do not resume driving if you have previously been instructed not to drive for other health reasons.)  Do not drive or use heavy machinery while taking prescription pain medicine. Activity  Avoid activities that take a lot of effort for at least 7 days after your procedure.  Do not lift anything that is heavier than 5 lb (4.5 kg) for one week.   No sexual activity for 1 week.   Return to your normal activities as told by your health care provider. Ask your health care provider what activities are safe for you. General instructions  Take over-the-counter and prescription medicines only as told by your health care provider.  Do not use any products that contain nicotine or tobacco, such as cigarettes and e-cigarettes. If you need help quitting, ask your health care provider.  You may shower after 24 hours, but Do not take baths, swim, or use  a hot tub for 1 week.   Do not drink alcohol for 24 hours after your procedure.  Keep all follow-up visits as told by your health care provider. This is important. Contact a health care provider if:  You have redness, mild swelling, or pain around your puncture site.  You have fluid or blood coming from your puncture site that stops after applying firm pressure to the area.  Your puncture site feels warm to the touch.  You have pus or  a bad smell coming from your puncture site.  You have a fever.  You have chest pain or discomfort that spreads to your neck, jaw, or arm.  You are sweating a lot.  You feel nauseous.  You have a fast or irregular heartbeat.  You have shortness of breath.  You are dizzy or light-headed and feel the need to lie down.  You have pain or numbness in the arm or leg closest to your puncture site. Get help right away if:  Your puncture site suddenly swells.  Your puncture site is bleeding and the bleeding does not stop after applying firm pressure to the area. These symptoms may represent a serious problem that is an emergency. Do not wait to see if the symptoms will go away. Get medical help right away. Call your local emergency services (911 in the [place] Do not drive yourself to the hospital. Summary  After the procedure, it is normal to have bruising and tenderness at the puncture site in your groin, neck, or forearm.  Check your puncture site every day for signs of infection.  Get help right away if your puncture site is bleeding and the bleeding does not stop after applying firm pressure to the area. This is a medical emergency. This information is not intended to replace advice given to you by your health care provider. Make sure you discuss any questions you have with your health care provider.   2)Look to the right for Heart &Vascular Parking Garage. 3)A code for the entrance is required, for December is 3007.   4)Take the elevators to the 1st floor. Registration is in the room with the glass walls at the end of the hallway.  If you have any trouble parking or locating the clinic, please don't hesitate to call [phone].

## 2020-03-14 NOTE — Anesthesia Preprocedure Evaluation (Signed)
Anesthesia Evaluation  Patient identified by MRN, date of birth, ID band Patient awake    Reviewed: Allergy & Precautions, NPO status , Patient's Chart, lab work & pertinent test results, reviewed documented beta blocker date and time   History of Anesthesia Complications Negative for: history of anesthetic complications  Airway Mallampati: III  TM Distance: >3 FB Neck ROM: Full    Dental  (+) Dental Advisory Given, Poor Dentition, Missing, Loose,    Pulmonary neg shortness of breath, asthma , neg sleep apnea, neg COPD, neg recent URI,  Covid-19 Nucleic Acid Test Results Lab Results      Component                Value               Date                      SARSCOV2NAA              NEGATIVE            03/13/2020              breath sounds clear to auscultation       Cardiovascular hypertension, Pt. on medications (-) angina+ dysrhythmias Atrial Fibrillation  Rhythm:Regular  1. Left ventricular ejection fraction, by estimation, is 60 to 65%. The  left ventricle has normal function. The left ventricle has no regional  wall motion abnormalities. Left ventricular diastolic parameters are  consistent with Grade II diastolic  dysfunction (pseudonormalization).  2. Right ventricular systolic function is normal. The right ventricular  size is normal. There is normal pulmonary artery systolic pressure. The  estimated right ventricular systolic pressure is 01.7 mmHg.  3. Left atrial size was mildly dilated.  4. The mitral valve is normal in structure. Mild to moderate mitral valve  regurgitation. No evidence of mitral stenosis.  5. The aortic valve is tricuspid. Aortic valve regurgitation is trivial.  No aortic stenosis is present.  6. Aortic dilatation noted. There is mild dilatation of the aortic root,  measuring 38 mm.  7. The inferior vena cava is normal in size with greater than 50%  respiratory variability, suggesting right  atrial pressure of 3 mmHg.   Loop recorder in place   Neuro/Psych  Headaches, PSYCHIATRIC DISORDERS Anxiety    GI/Hepatic Neg liver ROS, GERD  Controlled,  Endo/Other  negative endocrine ROS  Renal/GU negative Renal ROSLab Results      Component                Value               Date                      CREATININE               0.86                02/29/2020           Lab Results      Component                Value               Date                      K  4.0                 02/29/2020                Musculoskeletal  (+) Arthritis ,   Abdominal   Peds  Hematology  (+) Blood dyscrasia, , Lab Results      Component                Value               Date                      WBC                      6.1                 02/29/2020                HGB                      12.4                02/29/2020                HCT                      39.1                02/29/2020                MCV                      92.0                02/29/2020                PLT                      235                 02/29/2020            eliquis for afib   Anesthesia Other Findings   Reproductive/Obstetrics                             Anesthesia Physical Anesthesia Plan  ASA: II  Anesthesia Plan: General   Post-op Pain Management:    Induction: Intravenous  PONV Risk Score and Plan: 3 and Ondansetron and Dexamethasone  Airway Management Planned: Oral ETT and Video Laryngoscope Planned  Additional Equipment: None  Intra-op Plan:   Post-operative Plan: Extubation in OR  Informed Consent: I have reviewed the patients History and Physical, chart, labs and discussed the procedure including the risks, benefits and alternatives for the proposed anesthesia with the patient or authorized representative who has indicated his/her understanding and acceptance.     Dental advisory given  Plan Discussed with: CRNA and  Surgeon  Anesthesia Plan Comments:         Anesthesia Quick Evaluation

## 2020-03-15 ENCOUNTER — Encounter (HOSPITAL_COMMUNITY): Payer: Self-pay | Admitting: Internal Medicine

## 2020-03-21 NOTE — Anesthesia Postprocedure Evaluation (Signed)
Anesthesia Post Note  Patient: Nicole [name]  Procedure(s) Performed: ATRIAL FIBRILLATION ABLATION (N/A )     Patient location during evaluation: Cath Lab Anesthesia Type: General Level of consciousness: awake and alert Pain management: pain level controlled Vital Signs Assessment: post-procedure vital signs reviewed and stable Respiratory status: spontaneous breathing, nonlabored ventilation, respiratory function stable and patient connected to nasal cannula oxygen Cardiovascular status: stable Postop Assessment: no apparent nausea or vomiting Anesthetic complications: no   No complications documented.  Last Vitals:  Vitals:   03/14/20 1600 03/14/20 1630  BP: (!) 154/82 (!) 152/81  Pulse: 82 74  Resp: 19 17  Temp:    SpO2: 100% 100%    Last Pain:  Vitals:   03/14/20 1345  TempSrc:   PainSc: 0-No pain                 Delmus Warwick

## 2020-04-05 NOTE — Progress Notes (Signed)
Primary Care Physician: Harvie Junior, MD Referring Physician: Dr. [name] [name] is a 69 y.o. female with a h/o anemia,  afib, and syncope possibly 2/2 termination pause  that presented to his office with afib with RVR at 160 bpm and was admitted for Tikosyn load. Converted to SR without pause. Qtc remained stable. She is on Eliquis for a CHADS2VASC score of 3.   On follow up today, patient was having increasing frequency of symptomatic afib. She is s/p repeat afib ablation with Dr Rayann Heman on 03/14/20. She has done well since her procedure and her ILR shows no new episodes of afib post ablation. She did have some CP post procedure but this has resolved. She denies swallowing or groin issues.   Today, she denies symptoms of palpitations, chest pain, shortness of breath, orthopnea, PND, dizziness, presyncope, syncope, or neurologic sequela. The patient is tolerating medications without difficulties and is otherwise without complaint today.   Past Medical History:  Diagnosis Date  . Anticoagulant long-term use 11/16/2014  . Anxiety 10/06/2017  . Arthritis 10/06/2017   knees bilateral  . Asthma 11/16/2014  . Esophageal reflux 11/16/2014  . Essential hypertension 08/15/2015   pt denies  . H/O: GI bleed 12/11/2016  . Hyperlipidemia 11/17/2014  . Migraine headache 11/16/2014  . Nausea 11/16/2014  . Pain in limb 11/16/2014  . Panic disorder 11/16/2014  . Paroxysmal atrial fibrillation ([place]) 11/17/2014  . Swelling of joint 11/16/2014  . Symptomatic anemia 11/24/2016  . Tendonitis 11/16/2014  . UTI (urinary tract infection) 12/19/2015   Past Surgical History:  Procedure Laterality Date  . ABDOMINAL HYSTERECTOMY    . ATRIAL FIBRILLATION ABLATION N/A 03/14/2020   Procedure: ATRIAL FIBRILLATION ABLATION;  Surgeon: [name], MD;  Location: [facility];  Service: Cardiovascular;  Laterality: N/A;  . CARDIOVERSION    . CESAREAN SECTION    . COLONOSCOPY    . LOOP RECORDER  INSERTION N/A 02/04/2018   Procedure: LOOP RECORDER INSERTION;  Surgeon: [name], MD;  Location: [facility];  Service: Cardiovascular;  Laterality: N/A;  . ROTATOR CUFF REPAIR Left     Current Outpatient Medications  Medication Sig Dispense Refill  . acetaminophen (TYLENOL) 500 MG tablet Take 500 mg by mouth every 6 (six) hours as needed for mild pain or headache.    . butalbital-acetaminophen-caffeine (FIORICET) 50-325-40 MG tablet Take 1 tablet by mouth every 8 (eight) hours as needed for headache or migraine.     . butorphanol (STADOL) 10 MG/ML nasal spray Place 1 spray into the nose every 4 (four) hours as needed for headache or migraine.     . cetirizine (ZYRTEC) 10 MG tablet Take 10 mg by mouth daily as needed for allergies or rhinitis.    . clonazePAM (KLONOPIN) 1 MG tablet Take 1 mg by mouth 3 (three) times daily.    Marland Kitchen diltiazem (CARDIZEM) 30 MG tablet Take 1 Tablet Every 4 Hours As Needed For HR >100 45 tablet 1  . dofetilide (TIKOSYN) 500 MCG capsule TAKE 1 CAPSULE BY MOUTH TWICE A DAY 180 capsule 3  . ELIQUIS 5 MG TABS tablet TAKE 1 TABLET BY MOUTH TWICE A DAY 60 tablet 10  . fluticasone-salmeterol (ADVAIR HFA) 115-21 MCG/ACT inhaler Inhale 2 puffs into the lungs 2 (two) times daily.    . furosemide (LASIX) 20 MG tablet TAKE 1 TABLET BY MOUTH EVERY DAY AS NEEDED FOR EDEMA 90 tablet 2  . KLOR-CON M20 20 MEQ tablet TAKE 1  TABLET BY MOUTH EVERY DAY 90 tablet 3  . metoprolol succinate (TOPROL-XL) 50 MG 24 hr tablet Take 1 tablet (50 mg total) by mouth daily. Take with or immediately following a meal. 90 tablet 3  . pantoprazole (PROTONIX) 40 MG tablet Take 1 tablet (40 mg total) by mouth daily. 45 tablet 0  . pravastatin (PRAVACHOL) 40 MG tablet Take 40 mg by mouth at bedtime.     . promethazine (PHENERGAN) 25 MG tablet Take 25 mg by mouth every 6 (six) hours as needed for nausea or vomiting (from migraines).     . VENTOLIN HFA 108 (90 Base) MCG/ACT inhaler Inhale 2 puffs  into the lungs every 6 (six) hours as needed for wheezing or shortness of breath.     No current facility-administered medications for this encounter.    Allergies  Allergen Reactions  . Pneumococcal Vaccines Other (See Comments)    Went into A-Fib immediately after receiving this   . Shingrix [Zoster Vac Recomb Adjuvanted] Other (See Comments)    Went into A-Fib immediately after receiving this   . Other Other (See Comments)    Anesthesia- Takes a lot to anesthetize the patient  . Meperidine Nausea And Vomiting and Other (See Comments)    States she "About passed out," also  . Prednisone Palpitations and Other (See Comments)    Tachycardia     Social History   Socioeconomic History  . Marital status: Married    Spouse name: Not on file  . Number of children: Not on file  . Years of education: Not on file  . Highest education level: Not on file  Occupational History  . Not on file  Tobacco Use  . Smoking status: Never Smoker  . Smokeless tobacco: Never Used  Vaping Use  . Vaping Use: Never used  Substance and Sexual Activity  . Alcohol use: Never  . Drug use: Never  . Sexual activity: Not on file  Other Topics Concern  . Not on file  Social History Narrative   Lives in [place] with spouse   Retired from school system.   Social Determinants of Health   Financial Resource Strain: Not on file  Food Insecurity: Not on file  Transportation Needs: Not on file  Physical Activity: Not on file  Stress: Not on file  Social Connections: Not on file  Intimate Partner Violence: Not on file    Family History  Problem Relation Age of Onset  . Heart attack Mother   . Heart disease Mother   . Heart disease Sister   . Heart disease Sister   . Heart disease Sister   . Heart disease Sister   . Heart disease Sister     ROS- All systems are reviewed and negative except as per the HPI above  Physical Exam: Vitals:   04/06/20 1140  BP: 140/82  Pulse: 71  Weight: 88.5  kg  Height: 5\' 8"  (1.727 m)   Wt Readings from Last 3 Encounters:  04/06/20 88.5 kg  03/14/20 81.6 kg  01/23/20 88.5 kg    Labs: Lab Results  Component Value Date   NA 139 02/29/2020   K 4.0 02/29/2020   CL 107 02/29/2020   CO2 23 02/29/2020   GLUCOSE 100 (H) 02/29/2020   BUN 8 02/29/2020   CREATININE 0.86 02/29/2020   CALCIUM 9.2 02/29/2020   MG 2.2 02/29/2020   No results found for: INR No results found for: CHOL, HDL, LDLCALC, TRIG  GEN- The patient is  well appearing, alert and oriented x 3 today.   HEENT-head normocephalic, atraumatic, sclera clear, conjunctiva pink, hearing intact, trachea midline. Lungs- Clear to ausculation bilaterally, normal work of breathing Heart- Regular rate and rhythm, no murmurs, rubs or gallops  GI- soft, NT, ND, + BS Extremities- no clubbing, cyanosis, or edema MS- no significant deformity or atrophy Skin- no rash or lesion Psych- euthymic mood, full affect Neuro- strength and sensation are intact   EKG- SR HR 71, PR 190, QRS 88, QTc 447  Echo 02/24/20 demonstrated  1. Left ventricular ejection fraction, by estimation, is 60 to 65%. The  left ventricle has normal function. The left ventricle has no regional  wall motion abnormalities. Left ventricular diastolic parameters are  consistent with Grade II diastolic  dysfunction (pseudonormalization).  2. Right ventricular systolic function is normal. The right ventricular  size is normal. There is normal pulmonary artery systolic pressure. The  estimated right ventricular systolic pressure is 54.9 mmHg.  3. Left atrial size was mildly dilated.  4. The mitral valve is normal in structure. Mild to moderate mitral valve  regurgitation. No evidence of mitral stenosis.  5. The aortic valve is tricuspid. Aortic valve regurgitation is trivial.  No aortic stenosis is present.  6. Aortic dilatation noted. There is mild dilatation of the aortic root,  measuring 38 mm.  7. The inferior  vena cava is normal in size with greater than 50%  respiratory variability, suggesting right atrial pressure of 3 mmHg.   Epic records reviewed   Assessment and Plan: 1. Paroxysmal atrial fibrillation   S/p repeat ablation with Dr Rayann Heman 03/14/20 ILR shows no episodes of afib since the ablation.  Continue dofetilide 500 mcg BID. QT stable. Continue continue Toprol 50 mg daily.  Continue diltiazem 30 mg PRN q 4hrs for heart racing. Continue Eliquis 5 mg BID with no missed doses for at least 3 months post ablation.   Patient asks about Watchman device today. She is concerned about her bleeding risk because she has fallen several times in the past year. No head trauma. We briefly discussed rational for procedure as well as risks and benefits. She will d/w Dr Rayann Heman at her follow up appt.   This patients CHA2DS2-VASc Score and unadjusted Ischemic Stroke Rate (% per year) is equal to 3.2 % stroke rate/year from a score of 3  Above score calculated as 1 point each if present [CHF, HTN, DM, Vascular=MI/PAD/Aortic Plaque, Age if 65-74, or Female] Above score calculated as 2 points each if present [Age > 75, or Stroke/TIA/TE]  2. HTN Stable, no changes today.   Follow up with Dr Rayann Heman as scheduled.    [facility] [address] [place], [place] [address] [phone]

## 2020-04-06 ENCOUNTER — Encounter (HOSPITAL_COMMUNITY): Payer: Self-pay | Admitting: Physician Assistant

## 2020-04-06 ENCOUNTER — Other Ambulatory Visit: Payer: Self-pay

## 2020-04-06 ENCOUNTER — Ambulatory Visit (HOSPITAL_COMMUNITY)
Admission: RE | Admit: 2020-04-06 | Discharge: 2020-04-06 | Disposition: A | Discharge status: Home / Self Care | Payer: Medicare Other | Source: Ambulatory Visit | Attending: Physician Assistant | Admitting: Physician Assistant

## 2020-04-06 VITALS — BP 140/82 | HR 71 | Ht 68.0 in | Wt 195.2 lb

## 2020-04-06 DIAGNOSIS — E785 Hyperlipidemia, unspecified: Secondary | ICD-10-CM | POA: Diagnosis not present

## 2020-04-06 DIAGNOSIS — Z23 Encounter for immunization: Secondary | ICD-10-CM | POA: Diagnosis not present

## 2020-04-06 DIAGNOSIS — I10 Essential (primary) hypertension: Secondary | ICD-10-CM | POA: Diagnosis not present

## 2020-04-06 DIAGNOSIS — I480 Paroxysmal atrial fibrillation: Secondary | ICD-10-CM

## 2020-04-06 DIAGNOSIS — D6869 Other thrombophilia: Secondary | ICD-10-CM

## 2020-04-06 DIAGNOSIS — Z7901 Long term (current) use of anticoagulants: Secondary | ICD-10-CM | POA: Insufficient documentation

## 2020-04-10 ENCOUNTER — Ambulatory Visit (INDEPENDENT_AMBULATORY_CARE_PROVIDER_SITE_OTHER): Payer: Medicare Other

## 2020-04-10 DIAGNOSIS — I480 Paroxysmal atrial fibrillation: Secondary | ICD-10-CM | POA: Diagnosis not present

## 2020-04-11 LAB — CUP PACEART REMOTE DEVICE CHECK
Date Time Interrogation Session: 20211218231502
Implantable Pulse Generator Implant Date: 20191016

## 2020-04-17 DIAGNOSIS — G4700 Insomnia, unspecified: Secondary | ICD-10-CM | POA: Diagnosis not present

## 2020-04-17 DIAGNOSIS — G4459 Other complicated headache syndrome: Secondary | ICD-10-CM | POA: Diagnosis not present

## 2020-04-17 DIAGNOSIS — E785 Hyperlipidemia, unspecified: Secondary | ICD-10-CM | POA: Diagnosis not present

## 2020-04-17 DIAGNOSIS — F419 Anxiety disorder, unspecified: Secondary | ICD-10-CM | POA: Diagnosis not present

## 2020-04-17 DIAGNOSIS — I4891 Unspecified atrial fibrillation: Secondary | ICD-10-CM | POA: Diagnosis not present

## 2020-04-17 DIAGNOSIS — M179 Osteoarthritis of knee, unspecified: Secondary | ICD-10-CM | POA: Diagnosis not present

## 2020-04-17 DIAGNOSIS — I10 Essential (primary) hypertension: Secondary | ICD-10-CM | POA: Diagnosis not present

## 2020-04-20 ENCOUNTER — Other Ambulatory Visit: Payer: Self-pay | Admitting: Internal Medicine

## 2020-04-20 NOTE — Progress Notes (Signed)
Carelink Summary Report / Loop Recorder 

## 2020-05-04 ENCOUNTER — Telehealth: Payer: Self-pay

## 2020-05-04 NOTE — Telephone Encounter (Signed)
ILR summary report received. 237 new AF episode, known AF, on [facility].  AF burden is 0.5% of the time.  There were four symptom episodes.  Patient states yesterday morning the AF woke her up out of her sleep with palpitations and feels like she couldn't take a deep breath. Reports she took Cardizem then went back into SR. Reports compliance with all medications including Cardizem 30 (1 tablet every 4 hours if HR > 100 bpm), Tikosyn 500 mcg BID, Eliquis 5 mg BID, Lasix 20 mg prn, Toprol - XL 40 mg every day. Reports she is asymptomatic at this time. ED precautions given. Advised I will forward to Dr. Rayann Heman and Adline Peals, PA.   Unable to get other events transmitted d/t transmitter reading code 3248. [employer] phone number given and advised to call back with follow up. Agreeable to plan.

## 2020-05-04 NOTE — Telephone Encounter (Signed)
She appears to have done better since her ablation.  Some afib but remains within the 90 day healing period.  Continue current plan and follow-up.

## 2020-05-08 NOTE — Telephone Encounter (Signed)
Calling to follow up with patient. Patient called and advised. States she called [employer] and has a new hand held device ordered. Paitent reminded of follow up apt with Dr. Rayann Heman 06/21/20. Verbalized understanding.

## 2020-05-14 LAB — CUP PACEART REMOTE DEVICE CHECK
Date Time Interrogation Session: 20220121005351
Implantable Pulse Generator Implant Date: 20191016

## 2020-05-15 ENCOUNTER — Ambulatory Visit (INDEPENDENT_AMBULATORY_CARE_PROVIDER_SITE_OTHER): Payer: Medicare Other

## 2020-05-15 DIAGNOSIS — I480 Paroxysmal atrial fibrillation: Secondary | ICD-10-CM | POA: Diagnosis not present

## 2020-05-24 ENCOUNTER — Other Ambulatory Visit: Payer: Self-pay | Admitting: Internal Medicine

## 2020-05-25 NOTE — Progress Notes (Signed)
Carelink Summary Report / Loop Recorder 

## 2020-06-17 LAB — CUP PACEART REMOTE DEVICE CHECK
Date Time Interrogation Session: 20220223021051
Implantable Pulse Generator Implant Date: 20191016

## 2020-06-19 ENCOUNTER — Ambulatory Visit (INDEPENDENT_AMBULATORY_CARE_PROVIDER_SITE_OTHER): Payer: Medicare Other

## 2020-06-19 DIAGNOSIS — I480 Paroxysmal atrial fibrillation: Secondary | ICD-10-CM

## 2020-06-21 ENCOUNTER — Ambulatory Visit (INDEPENDENT_AMBULATORY_CARE_PROVIDER_SITE_OTHER): Payer: Medicare Other | Admitting: Internal Medicine

## 2020-06-21 ENCOUNTER — Encounter: Payer: Self-pay | Admitting: Internal Medicine

## 2020-06-21 ENCOUNTER — Other Ambulatory Visit: Payer: Self-pay

## 2020-06-21 VITALS — BP 124/78 | HR 75 | Ht 68.0 in | Wt 194.4 lb

## 2020-06-21 DIAGNOSIS — R55 Syncope and collapse: Secondary | ICD-10-CM

## 2020-06-21 DIAGNOSIS — I10 Essential (primary) hypertension: Secondary | ICD-10-CM

## 2020-06-21 DIAGNOSIS — I480 Paroxysmal atrial fibrillation: Secondary | ICD-10-CM

## 2020-06-21 NOTE — Progress Notes (Signed)
PCP: Harvie Junior, MD  Nicole [name] is a 70 y.o. female who presents today for routine electrophysiology followup.  Since his recent afib ablation, the patient reports doing very well.  she denies procedure related complications and is pleased with the results of the procedure.  Today, she denies symptoms of palpitations, chest pain, shortness of breath,  lower extremity edema, dizziness, presyncope, or syncope.  The patient is otherwise without complaint today.   Past Medical History:  Diagnosis Date  . Anticoagulant long-term use 11/16/2014  . Anxiety 10/06/2017  . Arthritis 10/06/2017   knees bilateral  . Asthma 11/16/2014  . Esophageal reflux 11/16/2014  . Essential hypertension 08/15/2015   pt denies  . H/O: GI bleed 12/11/2016  . Hyperlipidemia 11/17/2014  . Migraine headache 11/16/2014  . Nausea 11/16/2014  . Pain in limb 11/16/2014  . Panic disorder 11/16/2014  . Paroxysmal atrial fibrillation ([place]) 11/17/2014  . Swelling of joint 11/16/2014  . Symptomatic anemia 11/24/2016  . Tendonitis 11/16/2014  . UTI (urinary tract infection) 12/19/2015   Past Surgical History:  Procedure Laterality Date  . ABDOMINAL HYSTERECTOMY    . ATRIAL FIBRILLATION ABLATION N/A 03/14/2020   Procedure: ATRIAL FIBRILLATION ABLATION;  Surgeon: [name], MD;  Location: [facility];  Service: Cardiovascular;  Laterality: N/A;  . CARDIOVERSION    . CESAREAN SECTION    . COLONOSCOPY    . LOOP RECORDER INSERTION N/A 02/04/2018   Procedure: LOOP RECORDER INSERTION;  Surgeon: [name], MD;  Location: [facility];  Service: Cardiovascular;  Laterality: N/A;  . ROTATOR CUFF REPAIR Left     ROS- all systems are personally reviewed and negatives except as per HPI above  Current Outpatient Medications  Medication Sig Dispense Refill  . acetaminophen (TYLENOL) 500 MG tablet Take 500 mg by mouth every 6 (six) hours as needed for mild pain or headache.    .  butalbital-acetaminophen-caffeine (FIORICET) 50-325-40 MG tablet Take 1 tablet by mouth every 8 (eight) hours as needed for headache or migraine.     . butorphanol (STADOL) 10 MG/ML nasal spray Place 1 spray into the nose every 4 (four) hours as needed for headache or migraine.     . cetirizine (ZYRTEC) 10 MG tablet Take 10 mg by mouth daily as needed for allergies or rhinitis.    . clonazePAM (KLONOPIN) 1 MG tablet Take 1 mg by mouth 3 (three) times daily.    Marland Kitchen diltiazem (CARDIZEM) 30 MG tablet Take 1 Tablet Every 4 Hours As Needed For HR >100 45 tablet 1  . dofetilide (TIKOSYN) 500 MCG capsule TAKE 1 CAPSULE BY MOUTH TWICE A DAY 180 capsule 3  . ELIQUIS 5 MG TABS tablet TAKE 1 TABLET BY MOUTH TWICE A DAY 60 tablet 10  . fluticasone-salmeterol (ADVAIR HFA) 115-21 MCG/ACT inhaler Inhale 2 puffs into the lungs 2 (two) times daily.    . furosemide (LASIX) 20 MG tablet TAKE 1 TABLET BY MOUTH EVERY DAY AS NEEDED FOR EDEMA 90 tablet 2  . KLOR-CON M20 20 MEQ tablet TAKE 1 TABLET BY MOUTH EVERY DAY 90 tablet 3  . pantoprazole (PROTONIX) 40 MG tablet TAKE 1 TABLET BY MOUTH EVERY DAY 45 tablet 0  . pravastatin (PRAVACHOL) 40 MG tablet Take 40 mg by mouth at bedtime.     . promethazine (PHENERGAN) 25 MG tablet Take 25 mg by mouth every 6 (six) hours as needed for nausea or vomiting (from migraines).     . VENTOLIN HFA 108 (  90 Base) MCG/ACT inhaler Inhale 2 puffs into the lungs every 6 (six) hours as needed for wheezing or shortness of breath.    . metoprolol succinate (TOPROL-XL) 50 MG 24 hr tablet Take 1 tablet (50 mg total) by mouth daily. Take with or immediately following a meal. 90 tablet 3   No current facility-administered medications for this visit.    Physical Exam: Vitals:   06/21/20 0951  BP: 124/78  Pulse: 75  SpO2: 96%  Weight: 194 lb 6.4 oz (88.2 kg)  Height: 5\' 8"  (1.727 m)    GEN- The patient is well appearing, alert and oriented x 3 today.   Head- normocephalic, atraumatic Eyes-   Sclera clear, conjunctiva pink Ears- hearing intact Oropharynx- clear Lungs- Clear to ausculation bilaterally, normal work of breathing Heart- Regular rate and rhythm, no murmurs, rubs or gallops, PMI not laterally displaced GI- soft, NT, ND, + BS Extremities- no clubbing, cyanosis, or edema  EKG tracing ordered today is personally reviewed and shows sinus  Assessment and Plan:  1. Paroxysmal atrial fibrillation/ atach Doing well s/p ablation chads2vasc score is 3,  She is on eliquis AF burden is 0.4% Continue tikosyn She has a h/o anemia.  We discussed watchman at length today.  Risks of the procedure were discussed.  She wishes to continue medical therapy with eliquis at this time.  2. HTN Stable No change required today  3. Syncope Resolved   Return to see me in 3 months  [name] MD, [facility] 06/21/2020 9:53 AM

## 2020-06-21 NOTE — Patient Instructions (Addendum)
Medication Instructions:  Your physician recommends that you continue on your current medications as directed. Please refer to the Current Medication list given to you today.  Labwork: None ordered.  Testing/Procedures: None ordered.  Follow-Up: Your physician wants you to follow-up in: 09/14/20 at 9:30 am with [name], MD   Any Other Special Instructions Will Be Listed Below (If Applicable).  If you need a refill on your cardiac medications before your next appointment, please call your pharmacy.

## 2020-06-26 NOTE — Progress Notes (Signed)
Carelink Summary Report / Loop Recorder 

## 2020-07-17 ENCOUNTER — Ambulatory Visit (INDEPENDENT_AMBULATORY_CARE_PROVIDER_SITE_OTHER): Payer: Medicare Other

## 2020-07-17 DIAGNOSIS — I480 Paroxysmal atrial fibrillation: Secondary | ICD-10-CM | POA: Diagnosis not present

## 2020-07-17 LAB — CUP PACEART REMOTE DEVICE CHECK
Date Time Interrogation Session: 20220328031348
Implantable Pulse Generator Implant Date: 20191016

## 2020-07-31 NOTE — Progress Notes (Signed)
Carelink Summary Report / Loop Recorder 

## 2020-08-21 ENCOUNTER — Ambulatory Visit (INDEPENDENT_AMBULATORY_CARE_PROVIDER_SITE_OTHER): Payer: Medicare Other

## 2020-08-21 DIAGNOSIS — R55 Syncope and collapse: Secondary | ICD-10-CM | POA: Diagnosis not present

## 2020-08-22 LAB — CUP PACEART REMOTE DEVICE CHECK
Date Time Interrogation Session: 20220430031414
Implantable Pulse Generator Implant Date: 20191016

## 2020-08-24 ENCOUNTER — Other Ambulatory Visit: Payer: Self-pay | Admitting: Internal Medicine

## 2020-08-24 NOTE — Telephone Encounter (Signed)
Age 70, weight 88kg, SCr 0.86 on 02/29/20, afib indication, last visit March 2022

## 2020-09-02 ENCOUNTER — Other Ambulatory Visit: Payer: Self-pay | Admitting: Student

## 2020-09-02 DIAGNOSIS — I480 Paroxysmal atrial fibrillation: Secondary | ICD-10-CM

## 2020-09-12 NOTE — Progress Notes (Signed)
Carelink Summary Report / Loop Recorder 

## 2020-09-14 ENCOUNTER — Encounter: Payer: Medicare Other | Admitting: Internal Medicine

## 2020-09-17 ENCOUNTER — Other Ambulatory Visit (HOSPITAL_COMMUNITY): Payer: Self-pay | Admitting: Internal Medicine

## 2020-09-24 LAB — CUP PACEART REMOTE DEVICE CHECK
Date Time Interrogation Session: 20220602031543
Implantable Pulse Generator Implant Date: 20191016

## 2020-09-25 ENCOUNTER — Ambulatory Visit (INDEPENDENT_AMBULATORY_CARE_PROVIDER_SITE_OTHER): Payer: Medicare Other

## 2020-09-25 DIAGNOSIS — R55 Syncope and collapse: Secondary | ICD-10-CM | POA: Diagnosis not present

## 2020-09-27 ENCOUNTER — Encounter: Payer: Self-pay | Admitting: Internal Medicine

## 2020-09-27 ENCOUNTER — Other Ambulatory Visit: Payer: Self-pay

## 2020-09-27 ENCOUNTER — Ambulatory Visit (INDEPENDENT_AMBULATORY_CARE_PROVIDER_SITE_OTHER): Payer: Medicare Other | Admitting: Internal Medicine

## 2020-09-27 VITALS — BP 126/84 | HR 62 | Ht 68.0 in | Wt 199.0 lb

## 2020-09-27 DIAGNOSIS — I10 Essential (primary) hypertension: Secondary | ICD-10-CM

## 2020-09-27 DIAGNOSIS — I480 Paroxysmal atrial fibrillation: Secondary | ICD-10-CM

## 2020-09-27 DIAGNOSIS — I471 Supraventricular tachycardia: Secondary | ICD-10-CM

## 2020-09-27 LAB — CBC
Hematocrit: 36 % (ref 34.0–46.6)
Hemoglobin: 12.1 g/dL (ref 11.1–15.9)
MCH: 29.2 pg (ref 26.6–33.0)
MCHC: 33.6 g/dL (ref 31.5–35.7)
MCV: 87 fL (ref 79–97)
Platelets: 271 10*3/uL (ref 150–450)
RBC: 4.14 x10E6/uL (ref 3.77–5.28)
RDW: 12.1 % (ref 11.7–15.4)
WBC: 5.8 10*3/uL (ref 3.4–10.8)

## 2020-09-27 LAB — BASIC METABOLIC PANEL
BUN/Creatinine Ratio: 11 — ABNORMAL LOW (ref 12–28)
BUN: 10 mg/dL (ref 8–27)
CO2: 23 mmol/L (ref 20–29)
Calcium: 9.8 mg/dL (ref 8.7–10.3)
Chloride: 101 mmol/L (ref 96–106)
Creatinine, Ser: 0.93 mg/dL (ref 0.57–1.00)
Glucose: 98 mg/dL (ref 65–99)
Potassium: 4.4 mmol/L (ref 3.5–5.2)
Sodium: 140 mmol/L (ref 134–144)
eGFR: 67 mL/min/{1.73_m2} (ref >59)

## 2020-09-27 LAB — MAGNESIUM: Magnesium: 2.2 mg/dL (ref 1.6–2.3)

## 2020-09-27 NOTE — Patient Instructions (Addendum)
Medication Instructions:  Your physician recommends that you continue on your current medications as directed. Please refer to the Current Medication list given to you today.  Labwork: BMP, MAG, CBC  Testing/Procedures: None ordered.  Follow-Up: Your physician wants you to follow-up in: 3 months with the [facility]. They will contact you to schedule.  03/26/21 at 9:30 am with Dr. Rayann Heman.  Any Other Special Instructions Will Be Listed Below (If Applicable).  If you need a refill on your cardiac medications before your next appointment, please call your pharmacy.

## 2020-09-27 NOTE — Progress Notes (Signed)
PCP: Harvie Junior, MD   Primary EP: Dr Rayann Heman  Nicole [name] is a 70 y.o. female who presents today for routine electrophysiology followup.  Since last being seen in our clinic, the patient reports doing reasonably well.  Today, she denies symptoms of palpitations, chest pain, shortness of breath,  lower extremity edema, dizziness, presyncope, or syncope.  Her concern today is with weakness when in the heat.  + fatigue.  The patient is otherwise without complaint today.   Past Medical History:  Diagnosis Date  . Anticoagulant long-term use 11/16/2014  . Anxiety 10/06/2017  . Arthritis 10/06/2017   knees bilateral  . Asthma 11/16/2014  . Esophageal reflux 11/16/2014  . Essential hypertension 08/15/2015   pt denies  . H/O: GI bleed 12/11/2016  . Hyperlipidemia 11/17/2014  . Migraine headache 11/16/2014  . Nausea 11/16/2014  . Pain in limb 11/16/2014  . Panic disorder 11/16/2014  . Paroxysmal atrial fibrillation ([place]) 11/17/2014  . Swelling of joint 11/16/2014  . Symptomatic anemia 11/24/2016  . Tendonitis 11/16/2014  . UTI (urinary tract infection) 12/19/2015   Past Surgical History:  Procedure Laterality Date  . ABDOMINAL HYSTERECTOMY    . ATRIAL FIBRILLATION ABLATION N/A 03/14/2020   Procedure: ATRIAL FIBRILLATION ABLATION;  Surgeon: [name], MD;  Location: [facility];  Service: Cardiovascular;  Laterality: N/A;  . CARDIOVERSION    . CESAREAN SECTION    . COLONOSCOPY    . LOOP RECORDER INSERTION N/A 02/04/2018   Procedure: LOOP RECORDER INSERTION;  Surgeon: [name], MD;  Location: [facility];  Service: Cardiovascular;  Laterality: N/A;  . ROTATOR CUFF REPAIR Left     ROS- all systems are reviewed and negatives except as per HPI above  Current Outpatient Medications  Medication Sig Dispense Refill  . acetaminophen (TYLENOL) 500 MG tablet Take 500 mg by mouth every 6 (six) hours as needed for mild pain or headache.    .  butalbital-acetaminophen-caffeine (FIORICET) 50-325-40 MG tablet Take 1 tablet by mouth every 8 (eight) hours as needed for headache or migraine.     . butorphanol (STADOL) 10 MG/ML nasal spray Place 1 spray into the nose every 4 (four) hours as needed for headache or migraine.     . cetirizine (ZYRTEC) 10 MG tablet Take 10 mg by mouth daily as needed for allergies or rhinitis.    . clonazePAM (KLONOPIN) 1 MG tablet Take 1 mg by mouth 3 (three) times daily.    Marland Kitchen diltiazem (CARDIZEM) 30 MG tablet Take 1 Tablet Every 4 Hours As Needed For HR >100 45 tablet 1  . dofetilide (TIKOSYN) 500 MCG capsule TAKE 1 CAPSULE BY MOUTH TWICE A DAY 180 capsule 3  . ELIQUIS 5 MG TABS tablet TAKE 1 TABLET BY MOUTH TWICE A DAY 60 tablet 5  . fluticasone-salmeterol (ADVAIR HFA) 115-21 MCG/ACT inhaler Inhale 2 puffs into the lungs 2 (two) times daily.    . furosemide (LASIX) 20 MG tablet TAKE 1 TABLET BY MOUTH EVERY DAY AS NEEDED FOR EDEMA 90 tablet 3  . KLOR-CON M20 20 MEQ tablet TAKE 1 TABLET BY MOUTH EVERY DAY 90 tablet 3  . metoprolol succinate (TOPROL-XL) 50 MG 24 hr tablet TAKE 1 TABLET (50 MG TOTAL) BY MOUTH DAILY. TAKE WITH OR IMMEDIATELY FOLLOWING A MEAL. 90 tablet 3  . pravastatin (PRAVACHOL) 40 MG tablet Take 40 mg by mouth at bedtime.     . promethazine (PHENERGAN) 25 MG tablet Take 25 mg by mouth every 6 (  six) hours as needed for nausea or vomiting (from migraines).     . VENTOLIN HFA 108 (90 Base) MCG/ACT inhaler Inhale 2 puffs into the lungs every 6 (six) hours as needed for wheezing or shortness of breath.     No current facility-administered medications for this visit.    Physical Exam: Vitals:   09/27/20 0840  BP: 126/84  Pulse: 62  SpO2: 97%  Weight: 199 lb (90.3 kg)  Height: 5\' 8"  (1.727 m)    GEN- The patient is well appearing, alert and oriented x 3 today.   Head- normocephalic, atraumatic Eyes-  Sclera clear, conjunctiva pink Ears- hearing intact Oropharynx- clear Lungs- Clear to  ausculation bilaterally, normal work of breathing Heart- Regular rate and rhythm, no murmurs, rubs or gallops, PMI not laterally displaced GI- soft, NT, ND, + BS Extremities- no clubbing, cyanosis, or edema  Wt Readings from Last 3 Encounters:  09/27/20 199 lb (90.3 kg)  06/21/20 194 lb 6.4 oz (88.2 kg)  04/06/20 195 lb 3.2 oz (88.5 kg)    EKG tracing ordered today is personally reviewed and shows sinus  Assessment and Plan:  1. Paroxysmal atrial fibrillation/ atrial tachycardia Burden 0% by ILR (personally reviewed) chads2vasc score is 3.  Continue eliquis continue tikosyn We will need to follow her closely on this medicine to avoid toxicity Bmet, mg today  2. HTN Stable on metoprolol dose.  Medical management performed today No change required today  3. HL Continue pravochol 40mg  dose for medical management  4. Weakness/ fatigue I have offered that she could reduce toprol and she declines Check cbc given prior anemia  Risks, benefits and potential toxicities for medications prescribed and/or refilled reviewed with patient today.   Return in 3 months to see AF clinic, I will see in 6 months  [name] MD, [facility] 09/27/2020 9:03 AM

## 2020-10-16 ENCOUNTER — Other Ambulatory Visit: Payer: Self-pay | Admitting: Internal Medicine

## 2020-10-17 NOTE — Progress Notes (Signed)
Carelink Summary Report / Loop Recorder 

## 2020-10-25 ENCOUNTER — Telehealth: Payer: Self-pay

## 2020-10-25 NOTE — Telephone Encounter (Signed)
[facility] alert received for AF episode 10/25/20 beginning at 2:38 am and ongoing at time of transmission at 2:54 am. Successful telephone call to patient who states she was aware of palpitations, took PRN Cardizem and converted to NSR around 4 am. She feels well today with no additional palpitations or s/s of AF.

## 2020-10-30 ENCOUNTER — Ambulatory Visit (INDEPENDENT_AMBULATORY_CARE_PROVIDER_SITE_OTHER): Payer: Medicare Other

## 2020-10-30 DIAGNOSIS — I480 Paroxysmal atrial fibrillation: Secondary | ICD-10-CM | POA: Diagnosis not present

## 2020-10-31 LAB — CUP PACEART REMOTE DEVICE CHECK
Date Time Interrogation Session: 20220711082548
Implantable Pulse Generator Implant Date: 20191016

## 2020-11-14 ENCOUNTER — Telehealth: Payer: Self-pay | Admitting: Internal Medicine

## 2020-11-14 NOTE — Telephone Encounter (Signed)
Advised the patient there was no afib shown on monitor. Covid test was negative. Advised patient to call PCP. Verbalized understanding.

## 2020-11-14 NOTE — Telephone Encounter (Signed)
Pt is calling to consult with the nurse she has been sick for a week

## 2020-11-14 NOTE — Telephone Encounter (Signed)
Transmission from this morning shows no significant arrythmias.

## 2020-11-14 NOTE — Telephone Encounter (Signed)
The patient developed a cold last week and started using inhaler and she felt like it was breaking up but now she has no energy. She can only walk short distances, becomes short of breath and has to sit down. No, CP, nausea, vomiting, dizziness.  BP 116/63 HR 64. Covid was negative two weeks ago. Saw PCP two weeks ago for visit but she was not sick yet or having any of these symptoms. Advised patient to retake Covid test at home. Will have device reach out and check monitor to make sure she is not in rate controlled Afib; by then should have Covid results as well to report.

## 2020-11-21 NOTE — Progress Notes (Signed)
Carelink Summary Report / Loop Recorder 

## 2020-12-03 ENCOUNTER — Other Ambulatory Visit: Payer: Self-pay | Admitting: Internal Medicine

## 2020-12-04 ENCOUNTER — Ambulatory Visit (INDEPENDENT_AMBULATORY_CARE_PROVIDER_SITE_OTHER): Payer: Medicare Other

## 2020-12-04 DIAGNOSIS — I480 Paroxysmal atrial fibrillation: Secondary | ICD-10-CM | POA: Diagnosis not present

## 2020-12-04 LAB — CUP PACEART REMOTE DEVICE CHECK
Date Time Interrogation Session: 20220814232208
Implantable Pulse Generator Implant Date: 20191016

## 2020-12-21 IMAGING — DX DG CHEST 1V PORT
1 series · 1 of 1 positions shown · non-contrast
Comparison: September 02, 2018

CLINICAL DATA: AFib

EXAM:
PORTABLE CHEST 1 VIEW

[chest]
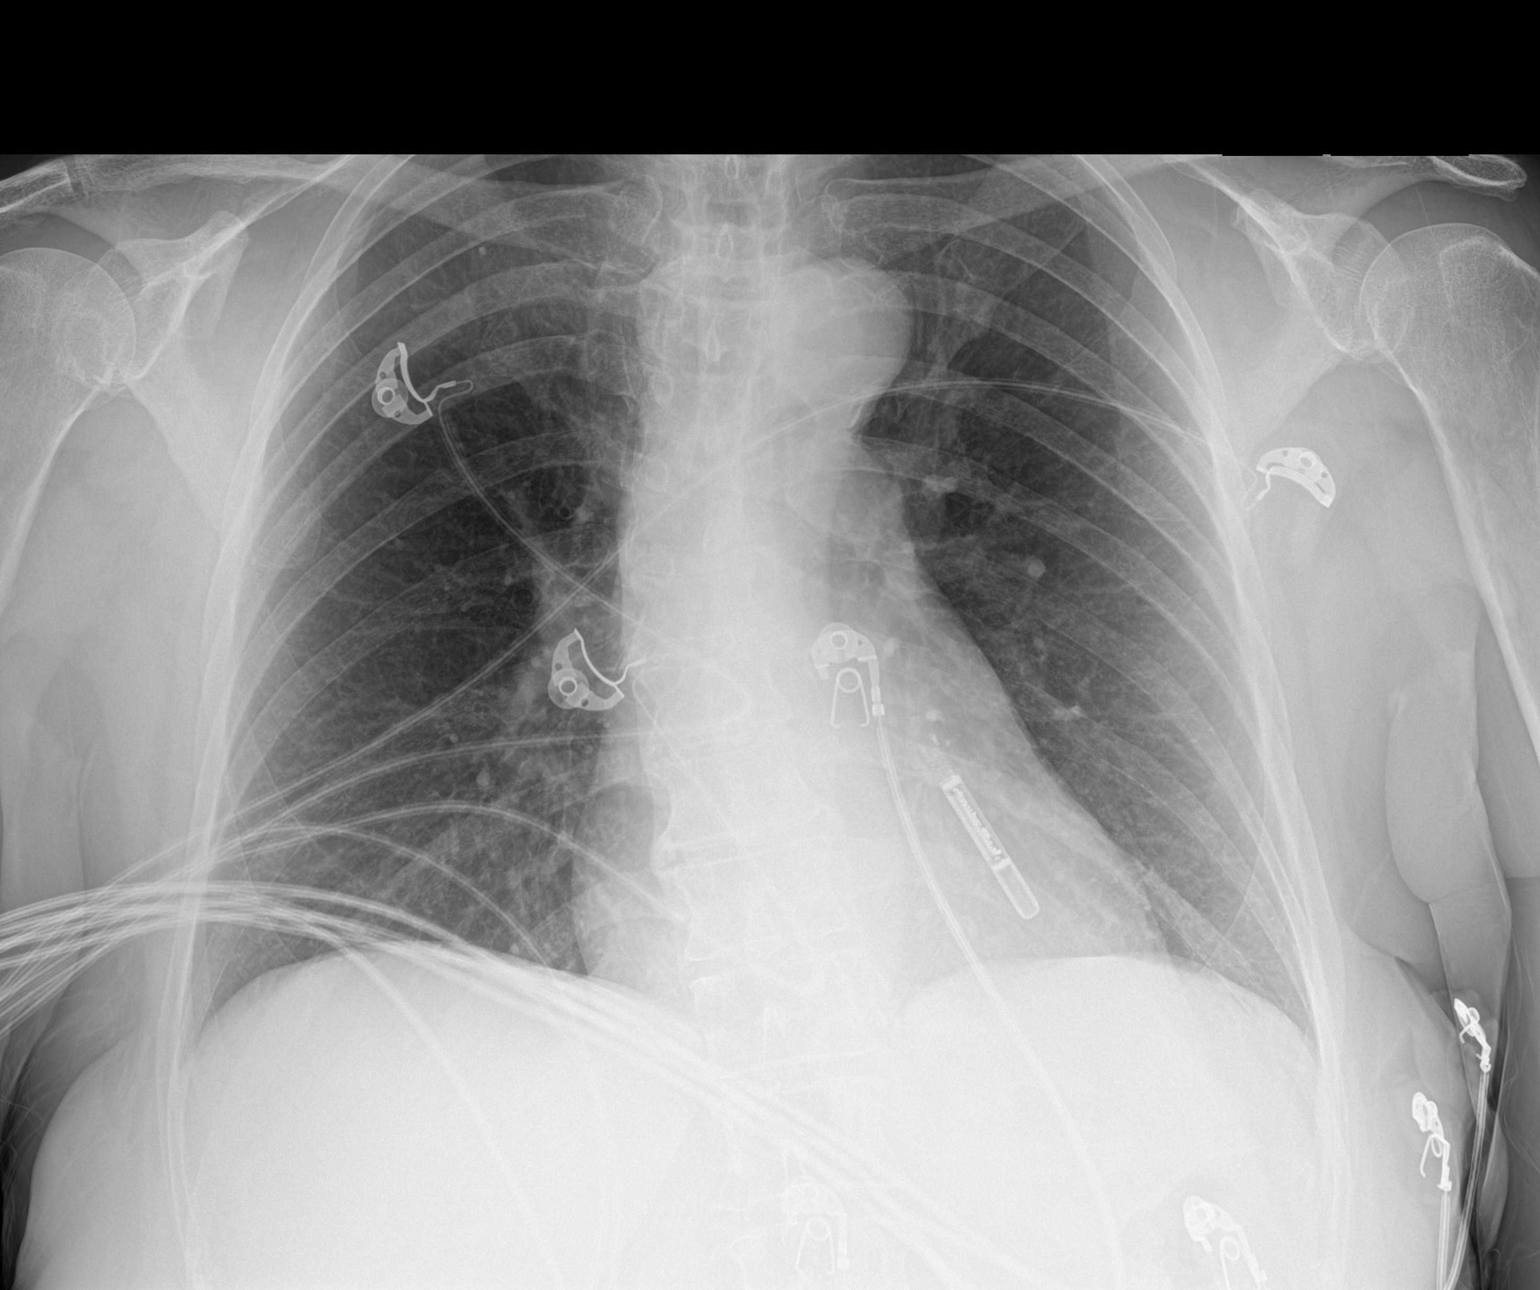

[1 of 1 positions shown; findings below may reference images not displayed]

FINDINGS: The heart size and mediastinal contours are within normal limits.
Aortic knob calcifications are seen. Overlying loop recorder is
noted. Both lungs are clear. The visualized skeletal structures are
unremarkable.
IMPRESSION: No active disease.

## 2020-12-23 NOTE — Progress Notes (Signed)
Carelink Summary Report / Loop Recorder 

## 2021-01-03 ENCOUNTER — Ambulatory Visit (HOSPITAL_COMMUNITY)
Admission: RE | Admit: 2021-01-03 | Discharge: 2021-01-03 | Disposition: A | Discharge status: Home / Self Care | Payer: Medicare Other | Source: Ambulatory Visit | Attending: Physician Assistant | Admitting: Physician Assistant

## 2021-01-03 ENCOUNTER — Other Ambulatory Visit: Payer: Self-pay

## 2021-01-03 VITALS — BP 140/80 | HR 68 | Ht 68.0 in | Wt 198.2 lb

## 2021-01-03 DIAGNOSIS — I10 Essential (primary) hypertension: Secondary | ICD-10-CM | POA: Insufficient documentation

## 2021-01-03 DIAGNOSIS — D6869 Other thrombophilia: Secondary | ICD-10-CM

## 2021-01-03 DIAGNOSIS — I480 Paroxysmal atrial fibrillation: Secondary | ICD-10-CM | POA: Diagnosis not present

## 2021-01-03 DIAGNOSIS — Z7901 Long term (current) use of anticoagulants: Secondary | ICD-10-CM | POA: Insufficient documentation

## 2021-01-03 DIAGNOSIS — Z8249 Family history of ischemic heart disease and other diseases of the circulatory system: Secondary | ICD-10-CM | POA: Insufficient documentation

## 2021-01-03 DIAGNOSIS — Z79899 Other long term (current) drug therapy: Secondary | ICD-10-CM | POA: Insufficient documentation

## 2021-01-03 LAB — BASIC METABOLIC PANEL
Anion gap: 9 (ref 5–15)
BUN: 11 mg/dL (ref 8–23)
CO2: 23 mmol/L (ref 22–32)
Calcium: 9.2 mg/dL (ref 8.9–10.3)
Chloride: 102 mmol/L (ref 98–111)
Creatinine, Ser: 0.93 mg/dL (ref 0.44–1.00)
GFR, Estimated: >60 mL/min (ref >60)
Glucose, Bld: 104 mg/dL — ABNORMAL HIGH (ref 70–99)
Potassium: 4.1 mmol/L (ref 3.5–5.1)
Sodium: 134 mmol/L — ABNORMAL LOW (ref 135–145)

## 2021-01-03 LAB — MAGNESIUM: Magnesium: 2.3 mg/dL (ref 1.7–2.4)

## 2021-01-03 NOTE — Progress Notes (Signed)
Primary Care Physician: Nicole Junior, MD Referring Physician: Dr. Berenice [name] [name] is a 70 y.o. female with a h/o anemia,  afib, and syncope possibly 2/2 termination pause  that presented to his office with afib with RVR at 160 bpm and was admitted for Tikosyn load. Converted to SR without pause. Qtc remained stable. She is on Eliquis for a CHADS2VASC score of 3. She is s/p repeat afib ablation with Dr Rayann Heman on 03/14/20.   On follow up today, patient reports that she has done well since her last visit. ILR shows one 60 minute episode on 10/25/20 but overall burden is 0%. She took PRN diltiazem for her afib episodes which resolved her symptoms quickly. She denies any bleeding issues on anticoagulation.   Today, she denies symptoms of palpitations, chest pain, shortness of breath, orthopnea, PND, dizziness, presyncope, syncope, or neurologic sequela. The patient is tolerating medications without difficulties and is otherwise without complaint today.   Past Medical History:  Diagnosis Date   Anticoagulant long-term use 11/16/2014   Anxiety 10/06/2017   Arthritis 10/06/2017   knees bilateral   Asthma 11/16/2014   Esophageal reflux 11/16/2014   Essential hypertension 08/15/2015   pt denies   H/O: GI bleed 12/11/2016   Hyperlipidemia 11/17/2014   Migraine headache 11/16/2014   Nausea 11/16/2014   Pain in limb 11/16/2014   Panic disorder 11/16/2014   Paroxysmal atrial fibrillation ([place]) 11/17/2014   Swelling of joint 11/16/2014   Symptomatic anemia 11/24/2016   Tendonitis 11/16/2014   UTI (urinary tract infection) 12/19/2015   Past Surgical History:  Procedure Laterality Date   ABDOMINAL HYSTERECTOMY     ATRIAL FIBRILLATION ABLATION N/A 03/14/2020   Procedure: ATRIAL FIBRILLATION ABLATION;  Surgeon: [name], MD;  Location: [facility];  Service: Cardiovascular;  Laterality: N/A;   CARDIOVERSION     CESAREAN SECTION     COLONOSCOPY     LOOP RECORDER INSERTION N/A  02/04/2018   Procedure: LOOP RECORDER INSERTION;  Surgeon: [name], MD;  Location: [facility];  Service: Cardiovascular;  Laterality: N/A;   ROTATOR CUFF REPAIR Left     Current Outpatient Medications  Medication Sig Dispense Refill   acetaminophen (TYLENOL) 500 MG tablet Take 500 mg by mouth every 6 (six) hours as needed for mild pain or headache.     butalbital-acetaminophen-caffeine (FIORICET) 50-325-40 MG tablet Take 1 tablet by mouth every 8 (eight) hours as needed for headache or migraine.      butorphanol (STADOL) 10 MG/ML nasal spray Place 1 spray into the nose every 4 (four) hours as needed for headache or migraine.      cetirizine (ZYRTEC) 10 MG tablet Take 10 mg by mouth daily as needed for allergies or rhinitis.     clonazePAM (KLONOPIN) 1 MG tablet Take 1 mg by mouth 3 (three) times daily.     diltiazem (CARDIZEM) 30 MG tablet Take 1 Tablet Every 4 Hours As Needed For HR >100 45 tablet 1   dofetilide (TIKOSYN) 500 MCG capsule TAKE 1 CAPSULE BY MOUTH TWICE A DAY 180 capsule 3   ELIQUIS 5 MG TABS tablet TAKE 1 TABLET BY MOUTH TWICE A DAY 60 tablet 5   fluticasone-salmeterol (ADVAIR HFA) 115-21 MCG/ACT inhaler Inhale 2 puffs into the lungs 2 (two) times daily.     furosemide (LASIX) 20 MG tablet TAKE 1 TABLET BY MOUTH EVERY DAY AS NEEDED FOR EDEMA 90 tablet 3   KLOR-CON M20 20 MEQ tablet TAKE  1 TABLET BY MOUTH EVERY DAY 90 tablet 3   metoprolol succinate (TOPROL-XL) 50 MG 24 hr tablet TAKE 1 TABLET (50 MG TOTAL) BY MOUTH DAILY. TAKE WITH OR IMMEDIATELY FOLLOWING A MEAL. 90 tablet 3   pravastatin (PRAVACHOL) 40 MG tablet Take 40 mg by mouth at bedtime.      promethazine (PHENERGAN) 25 MG tablet Take 25 mg by mouth every 6 (six) hours as needed for nausea or vomiting (from migraines).      VENTOLIN HFA 108 (90 Base) MCG/ACT inhaler Inhale 2 puffs into the lungs every 6 (six) hours as needed for wheezing or shortness of breath.     No current facility-administered  medications for this encounter.    Allergies  Allergen Reactions   Pneumococcal Vaccines Other (See Comments)    Went into A-Fib immediately after receiving this    Shingrix [Zoster Vac Recomb Adjuvanted] Other (See Comments)    Went into A-Fib immediately after receiving this    Other Other (See Comments)    Anesthesia- Takes a lot to anesthetize the patient   Meperidine Nausea And Vomiting and Other (See Comments)    States she "About passed out," also   Prednisone Palpitations and Other (See Comments)    Tachycardia     Social History   Socioeconomic History   Marital status: Married    Spouse name: Not on file   Number of children: Not on file   Years of education: Not on file   Highest education level: Not on file  Occupational History   Not on file  Tobacco Use   Smoking status: Never   Smokeless tobacco: Never  Vaping Use   Vaping Use: Never used  Substance and Sexual Activity   Alcohol use: Never   Drug use: Never   Sexual activity: Not on file  Other Topics Concern   Not on file  Social History Narrative   Lives in [place] with spouse   Retired from school system.   Social Determinants of Health   Financial Resource Strain: Not on file  Food Insecurity: Not on file  Transportation Needs: Not on file  Physical Activity: Not on file  Stress: Not on file  Social Connections: Not on file  Intimate Partner Violence: Not on file    Family History  Problem Relation Age of Onset   Heart attack Mother    Heart disease Mother    Heart disease Sister    Heart disease Sister    Heart disease Sister    Heart disease Sister    Heart disease Sister     ROS- All systems are reviewed and negative except as per the HPI above  Physical Exam: There were no vitals filed for this visit.  Wt Readings from Last 3 Encounters:  09/27/20 90.3 kg  06/21/20 88.2 kg  04/06/20 88.5 kg    Labs: Lab Results  Component Value Date   NA 140 09/27/2020   K 4.4  09/27/2020   CL 101 09/27/2020   CO2 23 09/27/2020   GLUCOSE 98 09/27/2020   BUN 10 09/27/2020   CREATININE 0.93 09/27/2020   CALCIUM 9.8 09/27/2020   MG 2.2 09/27/2020   No results found for: INR No results found for: CHOL, HDL, LDLCALC, TRIG  GEN- The patient is a well appearing obese female, alert and oriented x 3 today.   HEENT-head normocephalic, atraumatic, sclera clear, conjunctiva pink, hearing intact, trachea midline. Lungs- Clear to ausculation bilaterally, normal work of breathing Heart- Regular  rate and rhythm, no murmurs, rubs or gallops  GI- soft, NT, ND, + BS Extremities- no clubbing, cyanosis, or edema MS- no significant deformity or atrophy Skin- no rash or lesion Psych- euthymic mood, full affect Neuro- strength and sensation are intact   EKG- SR Vent. rate 68 BPM PR interval 188 ms QRS duration 80 ms QT/QTcB 414/440 ms  Echo 02/24/20 demonstrated  1. Left ventricular ejection fraction, by estimation, is 60 to 65%. The  left ventricle has normal function. The left ventricle has no regional  wall motion abnormalities. Left ventricular diastolic parameters are  consistent with Grade II diastolic dysfunction (pseudonormalization).   2. Right ventricular systolic function is normal. The right ventricular  size is normal. There is normal pulmonary artery systolic pressure. The estimated right ventricular systolic pressure is 123456 mmHg.   3. Left atrial size was mildly dilated.   4. The mitral valve is normal in structure. Mild to moderate mitral valve regurgitation. No evidence of mitral stenosis.   5. The aortic valve is tricuspid. Aortic valve regurgitation is trivial.  No aortic stenosis is present.   6. Aortic dilatation noted. There is mild dilatation of the aortic root,  measuring 38 mm.   7. The inferior vena cava is normal in size with greater than 50%  respiratory variability, suggesting right atrial pressure of 3 mmHg.   Epic records  reviewed   Assessment and Plan: 1. Paroxysmal atrial fibrillation   S/p repeat ablation with Dr Rayann Heman 03/14/20 ILR shows one 60 minute episode.  Continue dofetilide 500 mcg BID. QT stable. Continue continue Toprol 50 mg daily.  Continue diltiazem 30 mg PRN q 4hrs for heart racing. Continue Eliquis 5 mg BID   This patients CHA2DS2-VASc Score and unadjusted Ischemic Stroke Rate (% per year) is equal to 3.2 % stroke rate/year from a score of 3  Above score calculated as 1 point each if present [CHF, HTN, DM, Vascular=MI/PAD/Aortic Plaque, Age if 65-74, or Female] Above score calculated as 2 points each if present [Age > 75, or Stroke/TIA/TE]  2. HTN Stable, no changes today.   Follow up with Dr Rayann Heman as scheduled.    [facility] [address] [place], [place] [address] [phone]

## 2021-01-08 ENCOUNTER — Ambulatory Visit (INDEPENDENT_AMBULATORY_CARE_PROVIDER_SITE_OTHER): Payer: Medicare Other

## 2021-01-08 DIAGNOSIS — I480 Paroxysmal atrial fibrillation: Secondary | ICD-10-CM

## 2021-01-10 LAB — CUP PACEART REMOTE DEVICE CHECK
Date Time Interrogation Session: 20220916232225
Implantable Pulse Generator Implant Date: 20191016

## 2021-01-15 NOTE — Progress Notes (Signed)
Carelink Summary Report / Loop Recorder 

## 2021-02-07 ENCOUNTER — Other Ambulatory Visit: Payer: Self-pay | Admitting: Internal Medicine

## 2021-02-07 DIAGNOSIS — Z1231 Encounter for screening mammogram for malignant neoplasm of breast: Secondary | ICD-10-CM | POA: Diagnosis not present

## 2021-02-07 NOTE — Telephone Encounter (Signed)
Pt last saw Clint Fenton, PA 01/03/21, last labs 01/03/21 Creat 0.93, age 70, weight 89.9kg, based on specified criteria pt is on appropriate dosage of Eliquis 5mg  BID for afib.  Will refill rx.

## 2021-02-12 ENCOUNTER — Ambulatory Visit (INDEPENDENT_AMBULATORY_CARE_PROVIDER_SITE_OTHER): Payer: Medicare Other

## 2021-02-12 DIAGNOSIS — I480 Paroxysmal atrial fibrillation: Secondary | ICD-10-CM | POA: Diagnosis not present

## 2021-02-12 LAB — CUP PACEART REMOTE DEVICE CHECK
Date Time Interrogation Session: 20221019232616
Implantable Pulse Generator Implant Date: 20191016

## 2021-02-20 NOTE — Progress Notes (Signed)
Carelink Summary Report / Loop Recorder 

## 2021-02-25 ENCOUNTER — Other Ambulatory Visit (HOSPITAL_COMMUNITY): Payer: Self-pay | Admitting: Nurse Practitioner

## 2021-02-26 DIAGNOSIS — Z23 Encounter for immunization: Secondary | ICD-10-CM | POA: Diagnosis not present

## 2021-02-27 ENCOUNTER — Other Ambulatory Visit: Payer: Self-pay

## 2021-02-27 ENCOUNTER — Emergency Department (HOSPITAL_COMMUNITY)
Admission: EM | Admit: 2021-02-27 | Discharge: 2021-02-28 | Disposition: A | Discharge status: Home / Self Care | Payer: Medicare Other | Attending: Emergency Medicine | Admitting: Emergency Medicine

## 2021-02-27 ENCOUNTER — Emergency Department (HOSPITAL_COMMUNITY): Payer: Medicare Other

## 2021-02-27 DIAGNOSIS — R0789 Other chest pain: Secondary | ICD-10-CM | POA: Diagnosis not present

## 2021-02-27 DIAGNOSIS — Z7951 Long term (current) use of inhaled steroids: Secondary | ICD-10-CM | POA: Diagnosis not present

## 2021-02-27 DIAGNOSIS — I10 Essential (primary) hypertension: Secondary | ICD-10-CM | POA: Insufficient documentation

## 2021-02-27 DIAGNOSIS — Z79899 Other long term (current) drug therapy: Secondary | ICD-10-CM | POA: Insufficient documentation

## 2021-02-27 DIAGNOSIS — I4891 Unspecified atrial fibrillation: Secondary | ICD-10-CM | POA: Diagnosis not present

## 2021-02-27 DIAGNOSIS — R000 Tachycardia, unspecified: Secondary | ICD-10-CM | POA: Diagnosis not present

## 2021-02-27 DIAGNOSIS — Z7901 Long term (current) use of anticoagulants: Secondary | ICD-10-CM | POA: Insufficient documentation

## 2021-02-27 DIAGNOSIS — J45909 Unspecified asthma, uncomplicated: Secondary | ICD-10-CM | POA: Insufficient documentation

## 2021-02-27 DIAGNOSIS — R079 Chest pain, unspecified: Secondary | ICD-10-CM | POA: Diagnosis not present

## 2021-02-27 DIAGNOSIS — R42 Dizziness and giddiness: Secondary | ICD-10-CM | POA: Diagnosis not present

## 2021-02-27 LAB — CBC
HCT: 31.7 % — ABNORMAL LOW (ref 36.0–46.0)
Hemoglobin: 9.9 g/dL — ABNORMAL LOW (ref 12.0–15.0)
MCH: 27.1 pg (ref 26.0–34.0)
MCHC: 31.2 g/dL (ref 30.0–36.0)
MCV: 86.8 fL (ref 80.0–100.0)
Platelets: 261 10*3/uL (ref 150–400)
RBC: 3.65 MIL/uL — ABNORMAL LOW (ref 3.87–5.11)
RDW: 14.1 % (ref 11.5–15.5)
WBC: 6.1 10*3/uL (ref 4.0–10.5)
nRBC: 0 % (ref 0.0–0.2)

## 2021-02-27 LAB — TROPONIN I (HIGH SENSITIVITY)
Troponin I (High Sensitivity): 7 ng/L (ref <18)
Troponin I (High Sensitivity): 10 ng/L (ref <18)

## 2021-02-27 LAB — BASIC METABOLIC PANEL
Anion gap: 9 (ref 5–15)
BUN: 9 mg/dL (ref 8–23)
CO2: 22 mmol/L (ref 22–32)
Calcium: 9 mg/dL (ref 8.9–10.3)
Chloride: 104 mmol/L (ref 98–111)
Creatinine, Ser: 1 mg/dL (ref 0.44–1.00)
GFR, Estimated: >60 mL/min (ref >60)
Glucose, Bld: 101 mg/dL — ABNORMAL HIGH (ref 70–99)
Potassium: 4.4 mmol/L (ref 3.5–5.1)
Sodium: 135 mmol/L (ref 135–145)

## 2021-02-27 LAB — MAGNESIUM: Magnesium: 2.3 mg/dL (ref 1.7–2.4)

## 2021-02-27 LAB — TSH: TSH: 2.233 u[IU]/mL (ref 0.350–4.500)

## 2021-02-27 MED ORDER — DILTIAZEM HCL-DEXTROSE 125-5 MG/125ML-% IV SOLN (PREMIX)
5.0000 mg/h | INTRAVENOUS | Status: DC
  Administered 2021-02-27: 5 mg/h via INTRAVENOUS
  Filled 2021-02-27: qty 125

## 2021-02-27 NOTE — Discharge Instructions (Signed)
Your lab work showed a decrease in your hemoglobin, when compared to 5 months ago.  This is something that you should follow-up with your primary care doctor about for further lab work.  Continue to take your home medications, as prescribed.  You should get a call from the atrial fibrillation clinic to set up a close follow-up appointment.  If you experience any recurrence of your symptoms, please return to the emergency department.

## 2021-02-27 NOTE — Consult Note (Signed)
Cardiology Consultation:   Patient ID: Nicole [name] MRN: [id number]; DOB: 1951/04/04  Admit date: 02/27/2021 Date of Consult: 02/27/2021  PCP:  Harvie Junior, MD   [place] [facility] Cardiologist:  [name], MD  Electrophysiologist:  [name], MD       Patient Profile:   Nicole [name] is a 70 y.o. female with a hx of paroxysmal atrial fibrillation s/p ablation x2, HTN, HLD, GERD, and migraines who is being seen 02/27/2021 for the evaluation of atrial fibrillation with RVR at the request of Dr. Godfrey Pick.  History of Present Illness:   Nicole [name] was in her usual state of health until earlier this evening when she developed palpitations and chest pain while out to eat with family.  She has a longstanding history of paroxysmal atrial fibrillation that is historically been very symptomatic.  She is very much aware of when she goes into atrial fibrillation.  She takes Tikosyn, metoprolol, and as needed diltiazem for atrial fibrillation management and she states that she does not miss any of these medications.  When she felt the symptoms the patient checked her pulse via her home cardiac monitor which showed atrial fibrillation with RVR prompting her to call EMS.  She reports taking her home diltiazem but her tachycardia persisted.  On arrival EMS noted the patient was tachycardic and gave her IV diltiazem which converted her to NSR.  On arrival to the ED the patient's VS were T 37C, HR 70, BP 145/83, RR 19 and she was satting 90% on RA.  By the time I saw her the patient was completely asymptomatic and denied chest pain, shortness of breath, syncope, palpitations, nausea, vomiting, abdominal pain, weakness, or numbness.  Her EKG showed NSR.  Labs were only notable for hemoglobin 9.9 down from a prior level of 12.1.  I was consulted to evaluate for atrial fibrillation with RVR.   Past Medical History:  Diagnosis Date   Anticoagulant long-term use 11/16/2014   Anxiety  10/06/2017   Arthritis 10/06/2017   knees bilateral   Asthma 11/16/2014   Esophageal reflux 11/16/2014   Essential hypertension 08/15/2015   pt denies   H/O: GI bleed 12/11/2016   Hyperlipidemia 11/17/2014   Migraine headache 11/16/2014   Nausea 11/16/2014   Pain in limb 11/16/2014   Panic disorder 11/16/2014   Paroxysmal atrial fibrillation ([place]) 11/17/2014   Swelling of joint 11/16/2014   Symptomatic anemia 11/24/2016   Tendonitis 11/16/2014   UTI (urinary tract infection) 12/19/2015    Past Surgical History:  Procedure Laterality Date   ABDOMINAL HYSTERECTOMY     ATRIAL FIBRILLATION ABLATION N/A 03/14/2020   Procedure: ATRIAL FIBRILLATION ABLATION;  Surgeon: [name], MD;  Location: [facility];  Service: Cardiovascular;  Laterality: N/A;   CARDIOVERSION     CESAREAN SECTION     COLONOSCOPY     LOOP RECORDER INSERTION N/A 02/04/2018   Procedure: LOOP RECORDER INSERTION;  Surgeon: [name], MD;  Location: [facility];  Service: Cardiovascular;  Laterality: N/A;   ROTATOR CUFF REPAIR Left      Home Medications:  Prior to Admission medications   Medication Sig Start Date End Date Taking? Authorizing Provider  acetaminophen (TYLENOL) 500 MG tablet Take 500 mg by mouth every 6 (six) hours as needed for mild pain or headache.    [provider]  butalbital-acetaminophen-caffeine (FIORICET) 50-325-40 MG tablet Take 1 tablet by mouth every 8 (eight) hours as needed for headache or migraine.  [provider]  butorphanol (STADOL) 10 MG/ML nasal spray Place 1 spray into the nose every 4 (four) hours as needed for headache or migraine.     [provider]  cetirizine (ZYRTEC) 10 MG tablet Take 10 mg by mouth daily as needed for allergies or rhinitis.    [provider]  clonazePAM (KLONOPIN) 1 MG tablet Take 1 mg by mouth 3 (three) times daily.    [provider]  diltiazem (CARDIZEM) 30 MG tablet Take 1 Tablet Every 4 Hours As  Needed For HR >100 08/31/19   Fenton, Clint R, PA  dofetilide (TIKOSYN) 500 MCG capsule TAKE 1 CAPSULE BY MOUTH TWICE A DAY 12/04/20   Allred, Jeneen Rinks, MD  ELIQUIS 5 MG TABS tablet TAKE 1 TABLET BY MOUTH TWICE A DAY 02/07/21   Allred, Jeneen Rinks, MD  fluticasone-salmeterol (ADVAIR HFA) 115-21 MCG/ACT inhaler Inhale 2 puffs into the lungs 2 (two) times daily.    [provider]  furosemide (LASIX) 20 MG tablet TAKE 1 TABLET BY MOUTH EVERY DAY AS NEEDED FOR EDEMA 09/20/20   Allred, Jeneen Rinks, MD  KLOR-CON M20 20 MEQ tablet TAKE 1 TABLET BY MOUTH EVERY DAY 02/26/21   Allred, Jeneen Rinks, MD  metoprolol succinate (TOPROL-XL) 50 MG 24 hr tablet TAKE 1 TABLET (50 MG TOTAL) BY MOUTH DAILY. TAKE WITH OR IMMEDIATELY FOLLOWING A MEAL. 09/06/20 01/03/21  Allred, Jeneen Rinks, MD  pravastatin (PRAVACHOL) 40 MG tablet Take 40 mg by mouth at bedtime.  11/30/18   [provider]  promethazine (PHENERGAN) 25 MG tablet Take 25 mg by mouth every 6 (six) hours as needed for nausea or vomiting (from migraines).     [provider]  VENTOLIN HFA 108 (90 Base) MCG/ACT inhaler Inhale 2 puffs into the lungs every 6 (six) hours as needed for wheezing or shortness of breath.    [provider]    Inpatient Medications: Scheduled Meds:  Continuous Infusions:  PRN Meds:   Allergies:    Allergies  Allergen Reactions   Pneumococcal Vaccines Other (See Comments)    Went into A-Fib immediately after receiving this    Shingrix [Zoster Vac Recomb Adjuvanted] Other (See Comments)    Went into A-Fib immediately after receiving this    Other Other (See Comments)    Anesthesia- Takes a lot to anesthetize the patient   Meperidine Nausea And Vomiting and Other (See Comments)    States she "About passed out," also   Prednisone Palpitations and Other (See Comments)    Tachycardia     Social History:   Social History   Socioeconomic History   Marital status: Married    Spouse name: Not on file   Number of  children: Not on file   Years of education: Not on file   Highest education level: Not on file  Occupational History   Not on file  Tobacco Use   Smoking status: Never   Smokeless tobacco: Never  Vaping Use   Vaping Use: Never used  Substance and Sexual Activity   Alcohol use: Never   Drug use: Never   Sexual activity: Not on file  Other Topics Concern   Not on file  Social History Narrative   Lives in [place] with spouse   Retired from school system.   Social Determinants of Health   Financial Resource Strain: Not on file  Food Insecurity: Not on file  Transportation Needs: Not on file  Physical Activity: Not on file  Stress: Not on file  Social  Connections: Not on file  Intimate Partner Violence: Not on file    Family History:    Family History  Problem Relation Age of Onset   Heart attack Mother    Heart disease Mother    Heart disease Sister    Heart disease Sister    Heart disease Sister    Heart disease Sister    Heart disease Sister      ROS:  Please see the history of present illness.  All other ROS reviewed and negative.     Physical Exam/Data:   Vitals:   02/27/21 2145 02/27/21 2200 02/27/21 2235 02/27/21 2300  BP:  136/81 121/77 131/78  Pulse: (!) 57 (!) 56 (!) 53 (!) 57  Resp: 20 11 15 16   Temp:      TempSrc:      SpO2: 100% 100% 100% 100%  Weight:      Height:       No intake or output data in the 24 hours ending 02/27/21 2320 Last 3 Weights 02/27/2021 01/03/2021 09/27/2020  Weight (lbs) 191 lb 198 lb 3.2 oz 199 lb  Weight (kg) 86.637 kg 89.903 kg 90.266 kg     Body mass index is 29.04 kg/m.  General:  Well nourished, well developed, in no acute distress, very pleasant HEENT: normal Neck: no JVD Vascular: No carotid bruits; Distal pulses 2+ bilaterally Cardiac:  normal S1, S2; RRR; no murmur  Lungs:  clear to auscultation bilaterally, no wheezing, rhonchi or rales  Abd: soft, nontender, no hepatomegaly  Ext: no  edema Musculoskeletal:  No deformities, BUE and BLE strength normal and equal Skin: warm and dry  Neuro:  CNs 2-12 intact, no focal abnormalities noted Psych:  Normal affect   EKG:  The EKG was personally reviewed and demonstrates: NSR Telemetry:  Telemetry was personally reviewed and demonstrates: NSR  Relevant CV Studies:  TTE 02/24/20:  IMPRESSIONS     1. Left ventricular ejection fraction, by estimation, is 60 to 65%. The  left ventricle has normal function. The left ventricle has no regional  wall motion abnormalities. Left ventricular diastolic parameters are  consistent with Grade II diastolic  dysfunction (pseudonormalization).   2. Right ventricular systolic function is normal. The right ventricular  size is normal. There is normal pulmonary artery systolic pressure. The  estimated right ventricular systolic pressure is 65.9 mmHg.   3. Left atrial size was mildly dilated.   4. The mitral valve is normal in structure. Mild to moderate mitral valve  regurgitation. No evidence of mitral stenosis.   5. The aortic valve is tricuspid. Aortic valve regurgitation is trivial.  No aortic stenosis is present.   6. Aortic dilatation noted. There is mild dilatation of the aortic root,  measuring 38 mm.   7. The inferior vena cava is normal in size with greater than 50%  respiratory variability, suggesting right atrial pressure of 3 mmHg.   Laboratory Data:  High Sensitivity Troponin:   Recent Labs  Lab 02/27/21 2013  TROPONINIHS 7     Chemistry Recent Labs  Lab 02/27/21 2013  NA 135  K 4.4  CL 104  CO2 22  GLUCOSE 101*  BUN 9  CREATININE 1.00  CALCIUM 9.0  MG 2.3  GFRNONAA >60  ANIONGAP 9    No results for input(s): PROT, ALBUMIN, AST, ALT, ALKPHOS, BILITOT in the last 168 hours. Lipids No results for input(s): CHOL, TRIG, HDL, LABVLDL, LDLCALC, CHOLHDL in the last 168 hours.  Hematology Recent Labs  Lab 02/27/21 2013  WBC 6.1  RBC 3.65*  HGB 9.9*  HCT  31.7*  MCV 86.8  MCH 27.1  MCHC 31.2  RDW 14.1  PLT 261   Thyroid  Recent Labs  Lab 02/27/21 2013  TSH 2.233    BNPNo results for input(s): BNP, PROBNP in the last 168 hours.  DDimer No results for input(s): DDIMER in the last 168 hours.   Radiology/Studies:  DG Chest Port 1 View  Result Date: 02/27/2021 CLINICAL DATA:  Chest pain EXAM: PORTABLE CHEST 1 VIEW COMPARISON:  02/29/2020 FINDINGS: Lungs are clear.  No pleural effusion or pneumothorax. The heart is normal in size. IMPRESSION: No evidence of acute cardiopulmonary disease. Electronically Signed   By: Julian Hy M.D.   On: 02/27/2021 20:07     Assessment and Plan:   Nicole [name] is a 70 y.o. female with a hx of paroxysmal atrial fibrillation s/p ablation x2, HTN, HLD, GERD, and migraines who is being seen 02/27/2021 for the evaluation of atrial fibrillation with RVR at the request of Dr. Godfrey Pick.  #Paroxsymal Afib with RVR :: Patient initially called EMS for symptomatic A. fib that converted to NSR after IV diltiazem.  The patient is currently asymptomatic and feels great.  My physical exam is unremarkable.  I think this patient is safe for discharge from the ED and she should follow-up in atrial fibrillation clinic as well as with her PCP.  As far as why this happened, she states that she recently lost her niece yesterday and feels like the distress of that may have precipitated this.  Another possibility is that she is more anemic than usual and this perhaps is a driver of atrial fibrillation in the setting.  I encouraged her to undergo iron study testing with her PCP.  In terms of her medications for A. fib, her resting bradycardia limits my ability to increase her metoprolol.  Therefore, I recommend keeping her medications the same.   Risk Assessment/Risk Scores:          CHA2DS2-VASc Score = 3   This indicates a 3.2% annual risk of stroke. The patient's score is based upon:       [facility] will  sign off.   Medication Recommendations:  continue home regimen Other recommendations (labs, testing, etc):  iron studies as outpatient Follow up as an outpatient:  Afib clinic, PCP  For questions or updates, please contact [facility] Please consult [email or web address] for contact info under    Signed, Hershal Coria, MD  02/27/2021 11:21 PM

## 2021-02-27 NOTE — ED Triage Notes (Signed)
Pt arrived via via [facility] for cc of AFIB RVR (170-210bpm) with chest pain, heaviness, shortness of breath, dizziness. PMH of AFIB RVR, ablation last year, HTN.    EMS administered 10mg  Cardizem via 18 LAC enrote, pt converted to sinus rhythm (70bpm) enroute. Symptom resolution at this time with only mild chest discomfort.

## 2021-02-27 NOTE — ED Provider Notes (Signed)
[facility] EMERGENCY DEPARTMENT Provider Note   CSN: [id number] Arrival date & time: 02/27/21  1908     History Chief Complaint  Patient presents with   AFIB RVR    Nicole [name] is a 70 y.o. female.  HPI Patient presents for atrial fibrillation with RVR.  EMS reported heart rates in the range of 170-210.  At the time, she had symptoms of chest pain, shortness of breath, dizziness.  She has a history of the same and undergo ablation last year.  EMS gave 10 mg of Cardizem.  Patient converted to sinus rhythm.  Symptoms resolved with normalization of heart rate.  Patient also reports that she took her nighttime dose of metoprolol (50 mg of long-acting) at the time of symptom onset.  In addition to her nightly metoprolol, patient takes Eliquis.  She has been prescribed p.o. Cardizem as needed for rapid heart rate.  She has not needed to use this in over a year.  She denies any recurrence of her A. fib since her ablation, up until tonight.  Currently, she is asymptomatic.  She denies any recent infectious symptoms.  She did suffer the loss of her niece yesterday.  She has had grief and anxiety because of this.    Past Medical History:  Diagnosis Date   Anticoagulant long-term use 11/16/2014   Anxiety 10/06/2017   Arthritis 10/06/2017   knees bilateral   Asthma 11/16/2014   Esophageal reflux 11/16/2014   Essential hypertension 08/15/2015   pt denies   H/O: GI bleed 12/11/2016   Hyperlipidemia 11/17/2014   Migraine headache 11/16/2014   Nausea 11/16/2014   Pain in limb 11/16/2014   Panic disorder 11/16/2014   Paroxysmal atrial fibrillation ([place]) 11/17/2014   Swelling of joint 11/16/2014   Symptomatic anemia 11/24/2016   Tendonitis 11/16/2014   UTI (urinary tract infection) 12/19/2015    Patient Active Problem List   Diagnosis Date Noted   Secondary hypercoagulable state ([place]) 03/08/2019   Atrial flutter with rapid ventricular response ([place]) 12/31/2017   Anxiety 10/06/2017    Chest pain 10/06/2017   Arthritis 10/06/2017   H/O: GI bleed 12/11/2016   GI bleed 11/24/2016   Symptomatic anemia 11/24/2016   Generalized weakness 07/05/2016   Atrial fibrillation, chronic ([place]) 07/05/2016   Chest discomfort 12/19/2015   UTI (urinary tract infection) 12/19/2015   Essential hypertension 08/15/2015   Hyperlipidemia 11/17/2014   Paroxysmal atrial fibrillation ([place]) 11/17/2014   Anticoagulant long-term use 11/16/2014   Asthma 11/16/2014   Esophageal reflux 11/16/2014   Migraine headache 11/16/2014   Nausea 11/16/2014   Pain in limb 11/16/2014   Panic disorder 11/16/2014   Swelling of joint 11/16/2014   Tendonitis 11/16/2014    Past Surgical History:  Procedure Laterality Date   ABDOMINAL HYSTERECTOMY     ATRIAL FIBRILLATION ABLATION N/A 03/14/2020   Procedure: ATRIAL FIBRILLATION ABLATION;  Surgeon: [name], MD;  Location: [facility];  Service: Cardiovascular;  Laterality: N/A;   CARDIOVERSION     CESAREAN SECTION     COLONOSCOPY     LOOP RECORDER INSERTION N/A 02/04/2018   Procedure: LOOP RECORDER INSERTION;  Surgeon: [name], MD;  Location: [facility];  Service: Cardiovascular;  Laterality: N/A;   ROTATOR CUFF REPAIR Left      OB History   No obstetric history on file.     Family History  Problem Relation Age of Onset   Heart attack Mother    Heart disease Mother  Heart disease Sister    Heart disease Sister    Heart disease Sister    Heart disease Sister    Heart disease Sister     Social History   Tobacco Use   Smoking status: Never   Smokeless tobacco: Never  Vaping Use   Vaping Use: Never used  Substance Use Topics   Alcohol use: Never   Drug use: Never    Home Medications Prior to Admission medications   Medication Sig Start Date End Date Taking? Authorizing Provider  acetaminophen (TYLENOL) 500 MG tablet Take 500 mg by mouth every 6 (six) hours as needed for mild pain or headache.    [provider]  butalbital-acetaminophen-caffeine (FIORICET) 50-325-40 MG tablet Take 1 tablet by mouth every 8 (eight) hours as needed for headache or migraine.     [provider]  butorphanol (STADOL) 10 MG/ML nasal spray Place 1 spray into the nose every 4 (four) hours as needed for headache or migraine.     [provider]  cetirizine (ZYRTEC) 10 MG tablet Take 10 mg by mouth daily as needed for allergies or rhinitis.    [provider]  clonazePAM (KLONOPIN) 1 MG tablet Take 1 mg by mouth 3 (three) times daily.    [provider]  diltiazem (CARDIZEM) 30 MG tablet Take 1 Tablet Every 4 Hours As Needed For HR >100 08/31/19   Fenton, Clint R, PA  dofetilide (TIKOSYN) 500 MCG capsule TAKE 1 CAPSULE BY MOUTH TWICE A DAY 12/04/20   Allred, Jeneen Rinks, MD  ELIQUIS 5 MG TABS tablet TAKE 1 TABLET BY MOUTH TWICE A DAY 02/07/21   Allred, Jeneen Rinks, MD  fluticasone-salmeterol (ADVAIR HFA) 115-21 MCG/ACT inhaler Inhale 2 puffs into the lungs 2 (two) times daily.    [provider]  furosemide (LASIX) 20 MG tablet TAKE 1 TABLET BY MOUTH EVERY DAY AS NEEDED FOR EDEMA 09/20/20   Allred, Jeneen Rinks, MD  KLOR-CON M20 20 MEQ tablet TAKE 1 TABLET BY MOUTH EVERY DAY 02/26/21   Allred, Jeneen Rinks, MD  metoprolol succinate (TOPROL-XL) 50 MG 24 hr tablet TAKE 1 TABLET (50 MG TOTAL) BY MOUTH DAILY. TAKE WITH OR IMMEDIATELY FOLLOWING A MEAL. 09/06/20 01/03/21  Allred, Jeneen Rinks, MD  pravastatin (PRAVACHOL) 40 MG tablet Take 40 mg by mouth at bedtime.  11/30/18   [provider]  promethazine (PHENERGAN) 25 MG tablet Take 25 mg by mouth every 6 (six) hours as needed for nausea or vomiting (from migraines).     [provider]  VENTOLIN HFA 108 (90 Base) MCG/ACT inhaler Inhale 2 puffs into the lungs every 6 (six) hours as needed for wheezing or shortness of breath.    [provider]    Allergies    Pneumococcal vaccines, Shingrix [zoster vac recomb adjuvanted], Other,  Meperidine, and Prednisone  Review of Systems   Review of Systems  Constitutional:  Negative for activity change, appetite change, chills, fatigue and fever.  HENT:  Negative for congestion, ear pain and sore throat.   Eyes:  Negative for pain and visual disturbance.  Respiratory:  Positive for shortness of breath. Negative for cough.   Cardiovascular:  Positive for chest pain and palpitations.  Gastrointestinal:  Negative for abdominal pain, blood in stool, diarrhea, nausea and vomiting.  Genitourinary:  Negative for dysuria and hematuria.  Musculoskeletal:  Negative for arthralgias, back pain, joint swelling, myalgias and neck pain.  Skin:  Negative for color change and rash.  Neurological:  Positive for dizziness. Negative for  seizures, syncope, weakness and numbness.  Hematological:  Bruises/bleeds easily (On Eliquis).  Psychiatric/Behavioral:  Negative for confusion and decreased concentration.   All other systems reviewed and are negative.  Physical Exam Updated Vital Signs BP 131/78   Pulse (!) 57   Temp 98.6 F (37 C) (Oral)   Resp 16   Ht 5\' 8"  (1.727 m)   Wt 86.6 kg   SpO2 100%   BMI 29.04 kg/m   Physical Exam Vitals and nursing note reviewed.  Constitutional:      General: She is not in acute distress.    Appearance: Normal appearance. She is well-developed and normal weight. She is not ill-appearing, toxic-appearing or diaphoretic.  HENT:     Head: Normocephalic and atraumatic.     Right Ear: External ear normal.     Left Ear: External ear normal.     Nose: No congestion.     Mouth/Throat:     Mouth: Mucous membranes are moist.  Eyes:     General: No scleral icterus.    Extraocular Movements: Extraocular movements intact.     Conjunctiva/sclera: Conjunctivae normal.  Cardiovascular:     Rate and Rhythm: Normal rate and regular rhythm.     Heart sounds: No murmur heard. Pulmonary:     Effort: Pulmonary effort is normal. No respiratory distress.      Breath sounds: Normal breath sounds. No wheezing or rales.  Abdominal:     Palpations: Abdomen is soft.     Tenderness: There is no abdominal tenderness.  Musculoskeletal:        General: No swelling or deformity. Normal range of motion.     Cervical back: Normal range of motion and neck supple.  Skin:    General: Skin is warm and dry.     Coloration: Skin is not jaundiced or pale.  Neurological:     General: No focal deficit present.     Mental Status: She is alert and oriented to person, place, and time.     Cranial Nerves: No cranial nerve deficit.     Sensory: No sensory deficit.     Motor: No weakness.  Psychiatric:        Mood and Affect: Mood normal.        Behavior: Behavior normal.        Thought Content: Thought content normal.        Judgment: Judgment normal.    ED Results / Procedures / Treatments   Labs (all labs ordered are listed, but only abnormal results are displayed) Labs Reviewed  BASIC METABOLIC PANEL - Abnormal; Notable for the following components:      Result Value   Glucose, Bld 101 (*)    All other components within normal limits  CBC - Abnormal; Notable for the following components:   RBC 3.65 (*)    Hemoglobin 9.9 (*)    HCT 31.7 (*)    All other components within normal limits  MAGNESIUM  TSH  TROPONIN I (HIGH SENSITIVITY)  TROPONIN I (HIGH SENSITIVITY)    EKG EKG Interpretation  Date/Time:  Tuesday February 27 2021 19:12:58 EST Ventricular Rate:  71 PR Interval:  192 QRS Duration: 93 QT Interval:  413 QTC Calculation: 449 R Axis:   59 Text Interpretation: Sinus rhythm Borderline low voltage, extremity leads Confirmed by Godfrey Pick [phone]) on 02/27/2021 7:53:10 PM  Radiology DG Chest Port 1 View  Result Date: 02/27/2021 CLINICAL DATA:  Chest pain EXAM: PORTABLE CHEST 1 VIEW COMPARISON:  02/29/2020  FINDINGS: Lungs are clear.  No pleural effusion or pneumothorax. The heart is normal in size. IMPRESSION: No evidence of acute  cardiopulmonary disease. Electronically Signed   By: Julian Hy M.D.   On: 02/27/2021 20:07    Procedures Procedures   Medications Ordered in ED Medications - No data to display  ED Course  I have reviewed the triage vital signs and the nursing notes.  Pertinent labs & imaging results that were available during my care of the patient were reviewed by me and considered in my medical decision making (see chart for details).    MDM Rules/Calculators/A&P                           Patient is a 70 year old female who presents for acute onset of atrial fibrillation with RVR.  This was confirmed on EMS twelve-lead EKGs.  Onset of symptoms was 1 hour prior to arrival.  At that time, she took her nighttime dose of metoprolol.  In the ambulance, she had 10 mg of diltiazem.  She subsequently converted to normal sinus rhythm prior to arrival.  On arrival, she is asymptomatic.  EKG confirms normal sinus rhythm.  She is well-appearing on exam.  Lungs are clear to auscultation.  Vital signs are all normal.  Laboratory work-up was initiated.  Given her improvement following bolus of diltiazem, patient was started on diltiazem gtt.  She remained at a dose of 5 mg/h.  Initial plan was for admission to continue drip overnight and cardiology consult in the morning.  Patient stated that she does not want to come in the hospital.  I did speak with cardiology who agreed to come see the patient in the ED.  During her time of observation in the ED, she continued to be asymptomatic and remained in normal sinus rhythm with a heart rate in the range of 55-60.  Cardiology did not recommend any increased dose of her medications.  Patient can continue to take her current doses, as she has been doing.  They did recommend A. fib clinic follow-up.  Ambulatory referral was ordered.  Patient also has a new anemia.  She was advised to follow-up with her primary care doctor for further lab work and iron supplementation as needed.   She was also encouraged to return to the ED at any time if she does experience any recurrence of symptoms.  She was discharged in good condition.  Final Clinical Impression(s) / ED Diagnoses Final diagnoses:  Atrial fibrillation with RVR [facility])    Rx / DC Orders ED Discharge Orders          Ordered    Amb referral to AFIB Clinic        02/27/21 1919             Godfrey Pick, MD 02/27/21 2329

## 2021-03-05 NOTE — Progress Notes (Signed)
Primary Care Physician: Harvie Junior, MD Referring Physician: Dr. [name] Avakian is a 70 y.o. female with a h/o anemia,  afib, and syncope possibly 2/2 termination pause  that presented to his office with afib with RVR at 160 bpm and was admitted for Tikosyn load. Converted to SR without pause. Qtc remained stable. She is on Eliquis for a CHADS2VASC score of 3. She is s/p repeat afib ablation with Dr Rayann Heman on 03/14/20.   On follow up today, patient had tachypalpitations on 02/27/21 and took one of her PRN diltiazem which did not resolve her symptoms. EMS was called and ECG conformed afib with RVR. She was given IV diltiazem which converted her to SR. Patient does report that he niece has passed away the day before the onset of her symptoms. Overall, she has done very well since her afib ablation with a low burden of afib.  Today, she denies symptoms of palpitations, chest pain, shortness of breath, orthopnea, PND, dizziness, presyncope, syncope, or neurologic sequela. The patient is tolerating medications without difficulties and is otherwise without complaint today.   Past Medical History:  Diagnosis Date   Anticoagulant long-term use 11/16/2014   Anxiety 10/06/2017   Arthritis 10/06/2017   knees bilateral   Asthma 11/16/2014   Esophageal reflux 11/16/2014   Essential hypertension 08/15/2015   pt denies   H/O: GI bleed 12/11/2016   Hyperlipidemia 11/17/2014   Migraine headache 11/16/2014   Nausea 11/16/2014   Pain in limb 11/16/2014   Panic disorder 11/16/2014   Paroxysmal atrial fibrillation ([place]) 11/17/2014   Swelling of joint 11/16/2014   Symptomatic anemia 11/24/2016   Tendonitis 11/16/2014   UTI (urinary tract infection) 12/19/2015   Past Surgical History:  Procedure Laterality Date   ABDOMINAL HYSTERECTOMY     ATRIAL FIBRILLATION ABLATION N/A 03/14/2020   Procedure: ATRIAL FIBRILLATION ABLATION;  Surgeon: [name], MD;  Location: [facility];  Service:  Cardiovascular;  Laterality: N/A;   CARDIOVERSION     CESAREAN SECTION     COLONOSCOPY     LOOP RECORDER INSERTION N/A 02/04/2018   Procedure: LOOP RECORDER INSERTION;  Surgeon: [name], MD;  Location: [place] [facility];  Service: Cardiovascular;  Laterality: N/A;   ROTATOR CUFF REPAIR Left     Current Outpatient Medications  Medication Sig Dispense Refill   acetaminophen (TYLENOL) 500 MG tablet Take 500 mg by mouth every 6 (six) hours as needed for mild pain or headache.     butalbital-acetaminophen-caffeine (FIORICET) 50-325-40 MG tablet Take 1 tablet by mouth every 8 (eight) hours as needed for headache or migraine.      butorphanol (STADOL) 10 MG/ML nasal spray Place 1 spray into the nose every 4 (four) hours as needed for headache or migraine.      cetirizine (ZYRTEC) 10 MG tablet Take 10 mg by mouth daily as needed for allergies or rhinitis.     clonazePAM (KLONOPIN) 1 MG tablet Take 1 mg by mouth 3 (three) times daily.     diltiazem (CARDIZEM) 30 MG tablet Take 1 Tablet Every 4 Hours As Needed For HR >100 45 tablet 1   dofetilide (TIKOSYN) 500 MCG capsule TAKE 1 CAPSULE BY MOUTH TWICE A DAY 180 capsule 3   ELIQUIS 5 MG TABS tablet TAKE 1 TABLET BY MOUTH TWICE A DAY 60 tablet 5   fluticasone-salmeterol (ADVAIR HFA) 115-21 MCG/ACT inhaler Inhale 2 puffs into the lungs 2 (two) times daily.     furosemide (LASIX)  20 MG tablet TAKE 1 TABLET BY MOUTH EVERY DAY AS NEEDED FOR EDEMA 90 tablet 3   KLOR-CON M20 20 MEQ tablet TAKE 1 TABLET BY MOUTH EVERY DAY 90 tablet 3   metoprolol succinate (TOPROL-XL) 50 MG 24 hr tablet TAKE 1 TABLET (50 MG TOTAL) BY MOUTH DAILY. TAKE WITH OR IMMEDIATELY FOLLOWING A MEAL. 90 tablet 3   pravastatin (PRAVACHOL) 40 MG tablet Take 40 mg by mouth at bedtime.      promethazine (PHENERGAN) 25 MG tablet Take 25 mg by mouth every 6 (six) hours as needed for nausea or vomiting (from migraines).      VENTOLIN HFA 108 (90 Base) MCG/ACT inhaler Inhale 2 puffs into  the lungs every 6 (six) hours as needed for wheezing or shortness of breath.     No current facility-administered medications for this encounter.    Allergies  Allergen Reactions   Pneumococcal Vaccines Other (See Comments)    Went into A-Fib immediately after receiving this    Shingrix [Zoster Vac Recomb Adjuvanted] Other (See Comments)    Went into A-Fib immediately after receiving this    Other Other (See Comments)    Anesthesia- Takes a lot to anesthetize the patient   Meperidine Nausea And Vomiting and Other (See Comments)    States she "About passed out," also   Prednisone Palpitations and Other (See Comments)    Tachycardia     Social History   Socioeconomic History   Marital status: Married    Spouse name: Not on file   Number of children: Not on file   Years of education: Not on file   Highest education level: Not on file  Occupational History   Not on file  Tobacco Use   Smoking status: Never   Smokeless tobacco: Never  Vaping Use   Vaping Use: Never used  Substance and Sexual Activity   Alcohol use: Never   Drug use: Never   Sexual activity: Not on file  Other Topics Concern   Not on file  Social History Narrative   Lives in [place] with spouse   Retired from school system.   Social Determinants of Health   Financial Resource Strain: Not on file  Food Insecurity: Not on file  Transportation Needs: Not on file  Physical Activity: Not on file  Stress: Not on file  Social Connections: Not on file  Intimate Partner Violence: Not on file    Family History  Problem Relation Age of Onset   Heart attack Mother    Heart disease Mother    Heart disease Sister    Heart disease Sister    Heart disease Sister    Heart disease Sister    Heart disease Sister     ROS- All systems are reviewed and negative except as per the HPI above  Physical Exam: Vitals:   03/06/21 0838  BP: (!) 144/92  Pulse: 67  Weight: 94.4 kg  Height: 5\' 8"  (1.727 m)     Wt Readings from Last 3 Encounters:  03/06/21 94.4 kg  02/27/21 86.6 kg  01/03/21 89.9 kg    Labs: Lab Results  Component Value Date   NA 135 02/27/2021   K 4.4 02/27/2021   CL 104 02/27/2021   CO2 22 02/27/2021   GLUCOSE 101 (H) 02/27/2021   BUN 9 02/27/2021   CREATININE 1.00 02/27/2021   CALCIUM 9.0 02/27/2021   MG 2.3 02/27/2021   No results found for: INR No results found for: CHOL, HDL,  [place], TRIG  GEN- The patient is a well appearing obese female, alert and oriented x 3 today.   HEENT-head normocephalic, atraumatic, sclera clear, conjunctiva pink, hearing intact, trachea midline. Lungs- Clear to ausculation bilaterally, normal work of breathing Heart- Regular rate and rhythm, no murmurs, rubs or gallops  GI- soft, NT, ND, + BS Extremities- no clubbing, cyanosis, or edema MS- no significant deformity or atrophy Skin- no rash or lesion Psych- euthymic mood, full affect Neuro- strength and sensation are intact   EKG- SR Vent. rate 67 BPM PR interval 192 ms QRS duration 82 ms QT/QTcB 434/458 ms  Echo 02/24/20 demonstrated  1. Left ventricular ejection fraction, by estimation, is 60 to 65%. The  left ventricle has normal function. The left ventricle has no regional  wall motion abnormalities. Left ventricular diastolic parameters are  consistent with Grade II diastolic dysfunction (pseudonormalization).   2. Right ventricular systolic function is normal. The right ventricular  size is normal. There is normal pulmonary artery systolic pressure. The estimated right ventricular systolic pressure is 34.9 mmHg.   3. Left atrial size was mildly dilated.   4. The mitral valve is normal in structure. Mild to moderate mitral valve regurgitation. No evidence of mitral stenosis.   5. The aortic valve is tricuspid. Aortic valve regurgitation is trivial.  No aortic stenosis is present.   6. Aortic dilatation noted. There is mild dilatation of the aortic root,   measuring 38 mm.   7. The inferior vena cava is normal in size with greater than 50%  respiratory variability, suggesting right atrial pressure of 3 mmHg.   Epic records reviewed   Assessment and Plan: 1. Paroxysmal atrial fibrillation   S/p repeat ablation with Dr Rayann Heman 03/14/20 ILR shows 0.1% afib burden. Recent episode likely related to family stressor.  Continue dofetilide 500 mcg BID. QT stable. Continue continue Toprol 50 mg daily.  Continue diltiazem 30 mg PRN q 4hrs for heart racing. Continue Eliquis 5 mg BID  If she starts to have frequent episodes of afib, may consider alternate AAD vs another ablation. ? Effectiveness of another ablation given "multiple atypical atrial flutter circuits" noted on her previous report.   This patients CHA2DS2-VASc Score and unadjusted Ischemic Stroke Rate (% per year) is equal to 3.2 % stroke rate/year from a score of 3  Above score calculated as 1 point each if present [CHF, HTN, DM, Vascular=MI/PAD/Aortic Plaque, Age if 65-74, or Female] Above score calculated as 2 points each if present [Age > 75, or Stroke/TIA/TE]  2. HTN Stable, no changes today.   Follow up with Dr Rayann Heman as scheduled. AF clinic in 6 months.    [facility] [address] [place], [place] [address] [phone]

## 2021-03-06 ENCOUNTER — Encounter (HOSPITAL_COMMUNITY): Payer: Self-pay | Admitting: Physician Assistant

## 2021-03-06 ENCOUNTER — Ambulatory Visit (HOSPITAL_COMMUNITY)
Admission: RE | Admit: 2021-03-06 | Discharge: 2021-03-06 | Disposition: A | Discharge status: Home / Self Care | Payer: Medicare Other | Source: Ambulatory Visit | Attending: Physician Assistant | Admitting: Physician Assistant

## 2021-03-06 VITALS — BP 144/92 | HR 67 | Ht 68.0 in | Wt 208.2 lb

## 2021-03-06 DIAGNOSIS — I10 Essential (primary) hypertension: Secondary | ICD-10-CM | POA: Diagnosis not present

## 2021-03-06 DIAGNOSIS — Z634 Disappearance and death of family member: Secondary | ICD-10-CM | POA: Insufficient documentation

## 2021-03-06 DIAGNOSIS — R55 Syncope and collapse: Secondary | ICD-10-CM | POA: Insufficient documentation

## 2021-03-06 DIAGNOSIS — Z79899 Other long term (current) drug therapy: Secondary | ICD-10-CM | POA: Diagnosis not present

## 2021-03-06 DIAGNOSIS — I480 Paroxysmal atrial fibrillation: Secondary | ICD-10-CM | POA: Diagnosis not present

## 2021-03-06 DIAGNOSIS — D6869 Other thrombophilia: Secondary | ICD-10-CM

## 2021-03-06 DIAGNOSIS — D649 Anemia, unspecified: Secondary | ICD-10-CM | POA: Diagnosis not present

## 2021-03-06 DIAGNOSIS — Z7901 Long term (current) use of anticoagulants: Secondary | ICD-10-CM | POA: Diagnosis not present

## 2021-03-08 NOTE — Addendum Note (Signed)
Encounter addended by: Oliver Barre, PA on: 03/08/2021 8:36 AM  Actions taken: Level of Service modified

## 2021-03-19 ENCOUNTER — Ambulatory Visit (INDEPENDENT_AMBULATORY_CARE_PROVIDER_SITE_OTHER): Payer: Medicare Other

## 2021-03-19 DIAGNOSIS — I480 Paroxysmal atrial fibrillation: Secondary | ICD-10-CM | POA: Diagnosis not present

## 2021-03-19 LAB — CUP PACEART REMOTE DEVICE CHECK
Date Time Interrogation Session: 20221121222721
Implantable Pulse Generator Implant Date: 20191016

## 2021-03-20 DIAGNOSIS — I4891 Unspecified atrial fibrillation: Secondary | ICD-10-CM | POA: Diagnosis not present

## 2021-03-20 DIAGNOSIS — Z833 Family history of diabetes mellitus: Secondary | ICD-10-CM | POA: Diagnosis not present

## 2021-03-20 DIAGNOSIS — G4459 Other complicated headache syndrome: Secondary | ICD-10-CM | POA: Diagnosis not present

## 2021-03-20 DIAGNOSIS — I10 Essential (primary) hypertension: Secondary | ICD-10-CM | POA: Diagnosis not present

## 2021-03-20 DIAGNOSIS — Z85828 Personal history of other malignant neoplasm of skin: Secondary | ICD-10-CM | POA: Diagnosis not present

## 2021-03-20 DIAGNOSIS — E785 Hyperlipidemia, unspecified: Secondary | ICD-10-CM | POA: Diagnosis not present

## 2021-03-20 DIAGNOSIS — M179 Osteoarthritis of knee, unspecified: Secondary | ICD-10-CM | POA: Diagnosis not present

## 2021-03-26 ENCOUNTER — Ambulatory Visit (INDEPENDENT_AMBULATORY_CARE_PROVIDER_SITE_OTHER): Payer: Medicare Other | Admitting: Internal Medicine

## 2021-03-26 ENCOUNTER — Other Ambulatory Visit: Payer: Self-pay

## 2021-03-26 VITALS — BP 142/84 | HR 67 | Ht 68.0 in | Wt 207.8 lb

## 2021-03-26 DIAGNOSIS — I10 Essential (primary) hypertension: Secondary | ICD-10-CM | POA: Diagnosis not present

## 2021-03-26 DIAGNOSIS — I480 Paroxysmal atrial fibrillation: Secondary | ICD-10-CM | POA: Diagnosis not present

## 2021-03-26 DIAGNOSIS — I471 Supraventricular tachycardia: Secondary | ICD-10-CM | POA: Diagnosis not present

## 2021-03-26 NOTE — Patient Instructions (Addendum)
Medication Instructions:  Your physician recommends that you continue on your current medications as directed. Please refer to the Current Medication list given to you today. *If you need a refill on your cardiac medications before your next appointment, please call your pharmacy*  Lab Work: None. If you have labs (blood work) drawn today and your tests are completely normal, you will receive your results only by: [place] (if you have MyChart) OR A paper copy in the mail If you have any lab test that is abnormal or we need to change your treatment, we will call you to review the results.  Testing/Procedures: None.  Follow-Up: At [facility], you and your health needs are our priority.  As part of our continuing mission to provide you with exceptional heart care, we have created designated Provider Care Teams.  These Care Teams include your primary Cardiologist (physician) and Advanced Practice Providers (APPs -  Physician Assistants and Nurse Practitioners) who all work together to provide you with the care you need, when you need it.  Your physician wants you to follow-up in: 4 months with the Afib Clinic. They will contact you to schedule.    You will receive a reminder letter in the mail two months in advance. If you don't receive a letter, please call our office to schedule the follow-up appointment.  We recommend signing up for the patient portal called "MyChart".  Sign up information is provided on this After Visit Summary.  MyChart is used to connect with patients for Virtual Visits (Telemedicine).  Patients are able to view lab/test results, encounter notes, upcoming appointments, etc.  Non-urgent messages can be sent to your provider as well.   To learn more about what you can do with MyChart, go to [email or web address].    Any Other Special Instructions Will Be Listed Below (If Applicable).

## 2021-03-26 NOTE — Progress Notes (Signed)
PCP: Harvie Junior, MD   Primary EP: Dr Rayann Heman  Nicole [name] is a 70 y.o. female who presents today for routine electrophysiology followup.  Since last being seen in our clinic, the patient reports doing very well.  She had afib < 1 hour on 02/27/21 after hearing that her family member had overdosed on narcotics.  Today, she denies symptoms of palpitations, chest pain, shortness of breath,  lower extremity edema, dizziness, presyncope, or syncope.  The patient is otherwise without complaint today.   Past Medical History:  Diagnosis Date   Anticoagulant long-term use 11/16/2014   Anxiety 10/06/2017   Arthritis 10/06/2017   knees bilateral   Asthma 11/16/2014   Esophageal reflux 11/16/2014   Essential hypertension 08/15/2015   pt denies   H/O: GI bleed 12/11/2016   Hyperlipidemia 11/17/2014   Migraine headache 11/16/2014   Nausea 11/16/2014   Pain in limb 11/16/2014   Panic disorder 11/16/2014   Paroxysmal atrial fibrillation ([place]) 11/17/2014   Swelling of joint 11/16/2014   Symptomatic anemia 11/24/2016   Tendonitis 11/16/2014   UTI (urinary tract infection) 12/19/2015   Past Surgical History:  Procedure Laterality Date   ABDOMINAL HYSTERECTOMY     ATRIAL FIBRILLATION ABLATION N/A 03/14/2020   Procedure: ATRIAL FIBRILLATION ABLATION;  Surgeon: [name], MD;  Location: [place] [facility];  Service: Cardiovascular;  Laterality: N/A;   CARDIOVERSION     CESAREAN SECTION     COLONOSCOPY     LOOP RECORDER INSERTION N/A 02/04/2018   Procedure: LOOP RECORDER INSERTION;  Surgeon: [name], MD;  Location: [place] [facility];  Service: Cardiovascular;  Laterality: N/A;   ROTATOR CUFF REPAIR Left     ROS- all systems are reviewed and negatives except as per HPI above  Current Outpatient Medications  Medication Sig Dispense Refill   acetaminophen (TYLENOL) 500 MG tablet Take 500 mg by mouth every 6 (six) hours as needed for mild pain or headache.      butalbital-acetaminophen-caffeine (FIORICET) 50-325-40 MG tablet Take 1 tablet by mouth every 8 (eight) hours as needed for headache or migraine.      butorphanol (STADOL) 10 MG/ML nasal spray Place 1 spray into the nose every 4 (four) hours as needed for headache or migraine.      cetirizine (ZYRTEC) 10 MG tablet Take 10 mg by mouth daily as needed for allergies or rhinitis.     clonazePAM (KLONOPIN) 1 MG tablet Take 1 mg by mouth 3 (three) times daily.     diltiazem (CARDIZEM) 30 MG tablet Take 1 Tablet Every 4 Hours As Needed For HR >100 45 tablet 1   dofetilide (TIKOSYN) 500 MCG capsule TAKE 1 CAPSULE BY MOUTH TWICE A DAY 180 capsule 3   ELIQUIS 5 MG TABS tablet TAKE 1 TABLET BY MOUTH TWICE A DAY 60 tablet 5   fluticasone-salmeterol (ADVAIR HFA) 115-21 MCG/ACT inhaler Inhale 2 puffs into the lungs 2 (two) times daily.     furosemide (LASIX) 20 MG tablet TAKE 1 TABLET BY MOUTH EVERY DAY AS NEEDED FOR EDEMA 90 tablet 3   KLOR-CON M20 20 MEQ tablet TAKE 1 TABLET BY MOUTH EVERY DAY 90 tablet 3   pravastatin (PRAVACHOL) 40 MG tablet Take 40 mg by mouth at bedtime.      promethazine (PHENERGAN) 25 MG tablet Take 25 mg by mouth every 6 (six) hours as needed for nausea or vomiting (from migraines).      VENTOLIN HFA 108 (90 Base) MCG/ACT inhaler Inhale 2  puffs into the lungs every 6 (six) hours as needed for wheezing or shortness of breath.     metoprolol succinate (TOPROL-XL) 50 MG 24 hr tablet TAKE 1 TABLET (50 MG TOTAL) BY MOUTH DAILY. TAKE WITH OR IMMEDIATELY FOLLOWING A MEAL. 90 tablet 3   No current facility-administered medications for this visit.    Physical Exam: Vitals:   03/26/21 0948  BP: (!) 142/84  Pulse: 67  SpO2: 98%  Weight: 207 lb 12.8 oz (94.3 kg)  Height: 5\' 8"  (1.727 m)    GEN- The patient is well appearing, alert and oriented x 3 today.   Head- normocephalic, atraumatic Eyes-  Sclera clear, conjunctiva pink Ears- hearing intact Oropharynx- clear Lungs- Clear to  ausculation bilaterally, normal work of breathing Heart- Regular rate and rhythm, no murmurs, rubs or gallops, PMI not laterally displaced GI- soft, NT, ND, + BS Extremities- no clubbing, cyanosis, or edema  Wt Readings from Last 3 Encounters:  03/26/21 207 lb 12.8 oz (94.3 kg)  03/06/21 208 lb 3.2 oz (94.4 kg)  02/27/21 191 lb (86.6 kg)    EKG tracing ordered today is personally reviewed and shows sinus, qtc 452 msec  Assessment and Plan:  Paroxysmal atrial fibrillation/ atrial tachycardia Well controlled post ablation (burden 0.1% by ILR).  She did have short AF in November 8. 2022 but none since. She is on [place]. Given very rare AF post ablation, I would not advise changes at this time. Chads2vasc score is 3.  Continue eliquis.  2. HTN Stable No change required today   Risks, benefits and potential toxicities for medications prescribed and/or refilled reviewed with patient today.   Return to AF clinic in 4 months  [name] MD, [facility] 03/26/2021 10:02 AM

## 2021-03-27 NOTE — Progress Notes (Signed)
Carelink Summary Report / Loop Recorder 

## 2021-04-05 ENCOUNTER — Telehealth: Payer: Self-pay | Admitting: Internal Medicine

## 2021-04-05 NOTE — Telephone Encounter (Signed)
Patient states she would like to know when she needs to have her ILR removed. Please advise.

## 2021-04-17 ENCOUNTER — Ambulatory Visit (INDEPENDENT_AMBULATORY_CARE_PROVIDER_SITE_OTHER): Payer: Medicare Other

## 2021-04-17 DIAGNOSIS — I480 Paroxysmal atrial fibrillation: Secondary | ICD-10-CM | POA: Diagnosis not present

## 2021-04-17 LAB — CUP PACEART REMOTE DEVICE CHECK
Date Time Interrogation Session: 20221224222834
Implantable Pulse Generator Implant Date: 20191016

## 2021-04-27 NOTE — Progress Notes (Signed)
Carelink Summary Report / Loop Recorder 

## 2021-05-09 ENCOUNTER — Encounter (HOSPITAL_COMMUNITY): Payer: Self-pay

## 2021-05-09 ENCOUNTER — Other Ambulatory Visit: Payer: Self-pay

## 2021-05-09 ENCOUNTER — Emergency Department (HOSPITAL_COMMUNITY): Payer: Medicare Other

## 2021-05-09 ENCOUNTER — Emergency Department (HOSPITAL_COMMUNITY)
Admission: EM | Admit: 2021-05-09 | Discharge: 2021-05-09 | Disposition: A | Discharge status: Home / Self Care | Payer: Medicare Other | Attending: Emergency Medicine | Admitting: Emergency Medicine

## 2021-05-09 DIAGNOSIS — R0789 Other chest pain: Secondary | ICD-10-CM | POA: Diagnosis not present

## 2021-05-09 DIAGNOSIS — R079 Chest pain, unspecified: Secondary | ICD-10-CM | POA: Diagnosis not present

## 2021-05-09 DIAGNOSIS — Z79899 Other long term (current) drug therapy: Secondary | ICD-10-CM | POA: Insufficient documentation

## 2021-05-09 DIAGNOSIS — R0602 Shortness of breath: Secondary | ICD-10-CM | POA: Diagnosis present

## 2021-05-09 DIAGNOSIS — Z7901 Long term (current) use of anticoagulants: Secondary | ICD-10-CM | POA: Diagnosis not present

## 2021-05-09 DIAGNOSIS — I10 Essential (primary) hypertension: Secondary | ICD-10-CM | POA: Insufficient documentation

## 2021-05-09 DIAGNOSIS — D649 Anemia, unspecified: Secondary | ICD-10-CM | POA: Diagnosis not present

## 2021-05-09 DIAGNOSIS — I4891 Unspecified atrial fibrillation: Secondary | ICD-10-CM | POA: Insufficient documentation

## 2021-05-09 DIAGNOSIS — R000 Tachycardia, unspecified: Secondary | ICD-10-CM | POA: Diagnosis not present

## 2021-05-09 DIAGNOSIS — R42 Dizziness and giddiness: Secondary | ICD-10-CM | POA: Diagnosis not present

## 2021-05-09 LAB — BASIC METABOLIC PANEL
Anion gap: 8 (ref 5–15)
BUN: 12 mg/dL (ref 8–23)
CO2: 24 mmol/L (ref 22–32)
Calcium: 9.8 mg/dL (ref 8.9–10.3)
Chloride: 105 mmol/L (ref 98–111)
Creatinine, Ser: 0.83 mg/dL (ref 0.44–1.00)
GFR, Estimated: >60 mL/min (ref >60)
Glucose, Bld: 104 mg/dL — ABNORMAL HIGH (ref 70–99)
Potassium: 4.7 mmol/L (ref 3.5–5.1)
Sodium: 137 mmol/L (ref 135–145)

## 2021-05-09 LAB — CBC WITH DIFFERENTIAL/PLATELET
Abs Immature Granulocytes: 0.01 10*3/uL (ref 0.00–0.07)
Basophils Absolute: 0 10*3/uL (ref 0.0–0.1)
Basophils Relative: 1 %
Eosinophils Absolute: 0.4 10*3/uL (ref 0.0–0.5)
Eosinophils Relative: 8 %
HCT: 31.6 % — ABNORMAL LOW (ref 36.0–46.0)
Hemoglobin: 9.8 g/dL — ABNORMAL LOW (ref 12.0–15.0)
Immature Granulocytes: 0 %
Lymphocytes Relative: 38 %
Lymphs Abs: 2.2 10*3/uL (ref 0.7–4.0)
MCH: 26.8 pg (ref 26.0–34.0)
MCHC: 31 g/dL (ref 30.0–36.0)
MCV: 86.6 fL (ref 80.0–100.0)
Monocytes Absolute: 0.8 10*3/uL (ref 0.1–1.0)
Monocytes Relative: 13 %
Neutro Abs: 2.4 10*3/uL (ref 1.7–7.7)
Neutrophils Relative %: 40 %
Platelets: 260 10*3/uL (ref 150–400)
RBC: 3.65 MIL/uL — ABNORMAL LOW (ref 3.87–5.11)
RDW: 14.7 % (ref 11.5–15.5)
WBC: 5.8 10*3/uL (ref 4.0–10.5)
nRBC: 0 % (ref 0.0–0.2)

## 2021-05-09 LAB — TROPONIN I (HIGH SENSITIVITY)
Troponin I (High Sensitivity): 5 ng/L (ref <18)
Troponin I (High Sensitivity): 5 ng/L (ref <18)

## 2021-05-09 LAB — BRAIN NATRIURETIC PEPTIDE: B Natriuretic Peptide: 158.2 pg/mL — ABNORMAL HIGH (ref 0.0–100.0)

## 2021-05-09 NOTE — ED Triage Notes (Signed)
Pt brought to ED traige via wheelchair by [facility] with c/o atrial fibrillation that converted en route to facility. Pt was also complaining of lightheadedness and dizziness. Recorded rate with EMS prior to conversion  168 bpm.

## 2021-05-09 NOTE — ED Provider Notes (Signed)
[facility] EMERGENCY DEPARTMENT Provider Note   CSN: [id number] Arrival date & time: 05/09/21  0057     History  Chief Complaint  Patient presents with   Atrial Fibrillation    Nicole [name] is a 71 y.o. female who presents with concern for shortness of breath, palpitations, chest pressure that woke her from her sleep around 11 midnight this evening.  Had some associated lightheadedness and near syncopal sensation.  Per EMS patient was in atrial fibrillation with heart rate in the 160s, however while in route spontaneously converted back to normal sinus rhythm prior to any administration of medication.  Patient states she immediately felt better and has been feeling well since that time.  I have personally reviewed this patient's medical records.  She has history of chronic atrial fibrillation and is anticoagulated on Eliquis.  Also has history of hyperlipidemia, hypertension, GI bleed, asthma, and anxiety.  HPI     Home Medications Prior to Admission medications   Medication Sig Start Date End Date Taking? Authorizing Provider  acetaminophen (TYLENOL) 500 MG tablet Take 500 mg by mouth every 6 (six) hours as needed for mild pain or headache.    [provider]  butalbital-acetaminophen-caffeine (FIORICET) 50-325-40 MG tablet Take 1 tablet by mouth every 8 (eight) hours as needed for headache or migraine.     [provider]  butorphanol (STADOL) 10 MG/ML nasal spray Place 1 spray into the nose every 4 (four) hours as needed for headache or migraine.     [provider]  cetirizine (ZYRTEC) 10 MG tablet Take 10 mg by mouth daily as needed for allergies or rhinitis.    [provider]  clonazePAM (KLONOPIN) 1 MG tablet Take 1 mg by mouth 3 (three) times daily.    [provider]  diltiazem (CARDIZEM) 30 MG tablet Take 1 Tablet Every 4 Hours As Needed For HR >100 08/31/19   Fenton, Clint R, PA  dofetilide (TIKOSYN) 500 MCG  capsule TAKE 1 CAPSULE BY MOUTH TWICE A DAY 12/04/20   Allred, Jeneen Rinks, MD  ELIQUIS 5 MG TABS tablet TAKE 1 TABLET BY MOUTH TWICE A DAY 02/07/21   Allred, Jeneen Rinks, MD  fluticasone-salmeterol (ADVAIR HFA) 115-21 MCG/ACT inhaler Inhale 2 puffs into the lungs 2 (two) times daily.    [provider]  furosemide (LASIX) 20 MG tablet TAKE 1 TABLET BY MOUTH EVERY DAY AS NEEDED FOR EDEMA 09/20/20   Allred, Jeneen Rinks, MD  KLOR-CON M20 20 MEQ tablet TAKE 1 TABLET BY MOUTH EVERY DAY 02/26/21   Allred, Jeneen Rinks, MD  metoprolol succinate (TOPROL-XL) 50 MG 24 hr tablet TAKE 1 TABLET (50 MG TOTAL) BY MOUTH DAILY. TAKE WITH OR IMMEDIATELY FOLLOWING A MEAL. 09/06/20 03/06/21  Allred, Jeneen Rinks, MD  pravastatin (PRAVACHOL) 40 MG tablet Take 40 mg by mouth at bedtime.  11/30/18   [provider]  promethazine (PHENERGAN) 25 MG tablet Take 25 mg by mouth every 6 (six) hours as needed for nausea or vomiting (from migraines).     [provider]  VENTOLIN HFA 108 (90 Base) MCG/ACT inhaler Inhale 2 puffs into the lungs every 6 (six) hours as needed for wheezing or shortness of breath.    [provider]      Allergies    Pneumococcal vaccines, Shingrix [zoster vac recomb adjuvanted], Other, Meperidine, and Prednisone    Review of Systems   Review of Systems  Constitutional: Negative.   HENT: Negative.    Eyes: Negative.   Respiratory:  Positive for chest tightness and shortness of breath.   Cardiovascular:  Positive for chest pain and palpitations. Negative for leg swelling.  Gastrointestinal: Negative.   Genitourinary: Negative.   Neurological:  Positive for light-headedness.   Physical Exam Updated Vital Signs BP 133/90    Pulse 63    Temp 98.3 F (36.8 C) (Oral)    Resp 17    Ht 5\' 8"  (1.727 m)    Wt 94.3 kg    SpO2 100%    BMI 31.61 kg/m  Physical Exam Vitals and nursing note reviewed.  Constitutional:      Appearance: She is not ill-appearing or toxic-appearing.  HENT:     Head:  Normocephalic and atraumatic.     Mouth/Throat:     Mouth: Mucous membranes are moist.     Pharynx: No oropharyngeal exudate or posterior oropharyngeal erythema.  Eyes:     General:        Right eye: No discharge.        Left eye: No discharge.     Extraocular Movements: Extraocular movements intact.     Conjunctiva/sclera: Conjunctivae normal.     Pupils: Pupils are equal, round, and reactive to light.  Cardiovascular:     Rate and Rhythm: Normal rate and regular rhythm.     Pulses: Normal pulses.     Heart sounds: Normal heart sounds. No murmur heard. Pulmonary:     Effort: Pulmonary effort is normal. No respiratory distress.     Breath sounds: Normal breath sounds. No wheezing or rales.  Abdominal:     General: Bowel sounds are normal. There is no distension.     Palpations: Abdomen is soft.     Tenderness: There is no abdominal tenderness. There is no guarding.  Musculoskeletal:        General: No deformity.     Cervical back: Neck supple.     Right lower leg: No edema.     Left lower leg: No edema.  Skin:    General: Skin is warm and dry.     Capillary Refill: Capillary refill takes less than 2 seconds.  Neurological:     General: No focal deficit present.     Mental Status: She is alert and oriented to person, place, and time. Mental status is at baseline.  Psychiatric:        Mood and Affect: Mood normal.    ED Results / Procedures / Treatments   Labs (all labs ordered are listed, but only abnormal results are displayed) Labs Reviewed  CBC WITH DIFFERENTIAL/PLATELET - Abnormal; Notable for the following components:      Result Value   RBC 3.65 (*)    Hemoglobin 9.8 (*)    HCT 31.6 (*)    All other components within normal limits  BASIC METABOLIC PANEL - Abnormal; Notable for the following components:   Glucose, Bld 104 (*)    All other components within normal limits  BRAIN NATRIURETIC PEPTIDE - Abnormal; Notable for the following components:   B Natriuretic  Peptide 158.2 (*)    All other components within normal limits  TROPONIN I (HIGH SENSITIVITY)  TROPONIN I (HIGH SENSITIVITY)    EKG EKG Interpretation  Date/Time:  Wednesday May 09 2021 01:09:28 EST Ventricular Rate:  68 PR Interval:  182 QRS Duration: 80 QT Interval:  418 QTC Calculation: 444 R Axis:   72 Text Interpretation: Normal sinus rhythm with sinus arrhythmia Normal ECG When compared with ECG of 06-Mar-2021 08:41, PREVIOUS ECG  IS PRESENT Confirmed by Addison Lank [phone]) on 05/09/2021 5:39:51 AM  Radiology DG Chest 2 View  Result Date: 05/09/2021 CLINICAL DATA:  Atrial fibrillation. EXAM: CHEST - 2 VIEW COMPARISON:  Chest radiograph dated 02/27/2021 FINDINGS: No focal consolidation, pleural effusion or pneumothorax. Stable cardiac silhouette. Loop recorder device. No acute osseous pathology. IMPRESSION: No active cardiopulmonary disease. Electronically Signed   By: Anner Crete M.D.   On: 05/09/2021 01:48    Procedures Procedures    Medications Ordered in ED Medications - No data to display  ED Course/ Medical Decision Making/ A&P                           Medical Decision Making 71 year old female with history of atrial fibrillation who was found to be in A. fib with RVR with EMS but spontaneously converted in route.  Now in normal sinus rhythm.  Differential diagnosis for shortness of breath including limited to dysrhythmia, PE, pleural effusion, ACS, pneumonia, pneumothorax.  Mildly hypertensive on intake, vital signs otherwise normal.  Cardiopulmonary exam and abdominal exams are benign.  No dysrhythmia at this time.  Normal rate and rhythm.  Patient is very well-appearing and there is no focal deficit on neurologic exam.  Amount and/or Complexity of Data Reviewed Labs:     Details: CBC with anemia near patient's baseline with hemoglobin at 9.8.  BMP unremarkable.  BNP mildly elevated to 158.  Troponin negative, 5x2. Radiology:     Details: Chest  x-ray negative for acute cardiopulmonary disease.  EKG with normal sinus rhythm with sinus arrhythmia but without atrial fibrillation or STEMI at this time.   Given patient is well-appearing at this time and vital signs and physical exam are reassuring no further work-up is warranted in the ED.  Suspect she did experience A. fib with RVR but as she spontaneously converted no further work-up is warranted in the ED at this time.   This chart was dictated using voice recognition software, Dragon. Despite the best efforts of this provider to proofread and correct errors, errors may still occur which can change documentation meaning.  Final Clinical Impression(s) / ED Diagnoses Final diagnoses:  None    Rx / DC Orders ED Discharge Orders     None         Aura Dials 05/09/21 [place], [place], MD 05/10/21 [phone]

## 2021-05-09 NOTE — ED Provider Triage Note (Signed)
Emergency Medicine Provider Triage Evaluation Note  Nicole [name] , a 71 y.o. female  was evaluated in triage.  Pt complains of AFIB.  Rate 160's when EMS picked her up but self converted prior to cardizem being given.  Followed by cardiology, Dr. Rayann Heman.  On Eliquis, has been compliant  Review of Systems  Positive: AFIB Negative: fever  Physical Exam  BP (!) 155/85 (BP Location: Left Arm)    Pulse 64    Temp 98.3 F (36.8 C) (Oral)    Resp 16    Ht 5\' 8"  (1.727 m)    Wt 94.3 kg    SpO2 98%    BMI 31.61 kg/m   Gen:   Awake, no distress   Resp:  Normal effort  MSK:   Moves extremities without difficulty  Other:    Medical Decision Making  Medically screening exam initiated at 12:56 AM.  Appropriate orders placed.  Nicole [name] was informed that the remainder of the evaluation will be completed by another provider, this initial triage assessment does not replace that evaluation, and the importance of remaining in the ED until their evaluation is complete.  AFIB, converted en route with EMS.  EKG, labs, CXR.   Larene Pickett, PA-C 05/09/21 [phone]

## 2021-05-09 NOTE — Discharge Instructions (Addendum)
You were seen in the ER today for your shortness of breath. You were in Afib with a rapid heart rate, that resolved on its own injury to the hospital.  Your physical exam and vital signs were very reassuring.  Please follow-up with your cardiologist and your primary care doctor in the outpatient setting continue take your Eliquis.  Return to the ER with any severe symptoms.

## 2021-05-15 NOTE — Progress Notes (Signed)
Cardiology Office Note Date:  05/16/2021  Patient ID:  Nicole [name], Nicole [name] June 18, 1950, MRN [id number] PCP:  Harvie Junior, MD  Electrophysiologist: Dr. Rayann Heman    Chief Complaint:  hospital follow up  History of Present Illness: Nicole [name] is a 71 y.o. female with history of HTN, HLD, migraine HA's, Afib.  She comes in today to be seen for Dr. Rayann Heman, last seen by him Dec 2022, doing well, reported a single episode of Afib associated with the news of hearing about a trajic death in the family. AF burden by her ILR was 0.1%, no changes were made.  She called EMS 05/09/21 for Afib w/RVR, by notes rates 160's reportedly spontaneously converted prior to any drugs being given by EMS and reported she immediately felt better. Discharged from the ER after some observation K+ 4.7 BUN/Creat 12/0.83 HS Trop 5,5  BNP 158 WBC 5.8 H/H 9.8/31  (November 2022 was 9.9/31.7) Plts 260  TODAY She has not had further AF since her ER visit She is a bit emotional talking about her experience there, mentions she had never been treated so poorly, felt she had just been set off to the side and unimportant, or forgotten about. She denies CP or SOB Does feel like it is taking longer then usual to feel like she is back to normal though, usually after he Afib episodes/RVR she feels pretty tired/wiped out for a couple days, taking longer to recover this time.  She is concerned, having had 2 episodes of Afib in only a couple months.  No near syncope or syncope. No bleeding or signs of bleeding  She is very good with her medicines/compliance   Device information MDT LINQ, implanted 02/04/2018  Afib Hx Remote flecainide > amiodarone > Tikosyn started Sept 2019 Reports of Afib ablation by Dr. Ola Spurr (record unavailable) reported by the patient Jan and May 2019 Referred to Dr. Rayann Heman June 2019 PVI and L atrial ablation 2021 (noted as well  Multiple atypical atrial flutter circuits observed with  atrial pacing during isuprel infusion.  These were variable and not suitable for 3D mapping or ablation)  Past Medical History:  Diagnosis Date   Anticoagulant long-term use 11/16/2014   Anxiety 10/06/2017   Arthritis 10/06/2017   knees bilateral   Asthma 11/16/2014   Esophageal reflux 11/16/2014   Essential hypertension 08/15/2015   pt denies   H/O: GI bleed 12/11/2016   Hyperlipidemia 11/17/2014   Migraine headache 11/16/2014   Nausea 11/16/2014   Pain in limb 11/16/2014   Panic disorder 11/16/2014   Paroxysmal atrial fibrillation ([place]) 11/17/2014   Swelling of joint 11/16/2014   Symptomatic anemia 11/24/2016   Tendonitis 11/16/2014   UTI (urinary tract infection) 12/19/2015    Past Surgical History:  Procedure Laterality Date   ABDOMINAL HYSTERECTOMY     ATRIAL FIBRILLATION ABLATION N/A 03/14/2020   Procedure: ATRIAL FIBRILLATION ABLATION;  Surgeon: [name], MD;  Location: [facility];  Service: Cardiovascular;  Laterality: N/A;   CARDIOVERSION     CESAREAN SECTION     COLONOSCOPY     LOOP RECORDER INSERTION N/A 02/04/2018   Procedure: LOOP RECORDER INSERTION;  Surgeon: [name], MD;  Location: [facility];  Service: Cardiovascular;  Laterality: N/A;   ROTATOR CUFF REPAIR Left     Current Outpatient Medications  Medication Sig Dispense Refill   acetaminophen (TYLENOL) 500 MG tablet Take 500 mg by mouth every 6 (six) hours as needed for mild pain or headache.  butalbital-acetaminophen-caffeine (FIORICET) 50-325-40 MG tablet Take 1 tablet by mouth every 8 (eight) hours as needed for headache or migraine.      butorphanol (STADOL) 10 MG/ML nasal spray Place 1 spray into the nose every 4 (four) hours as needed for headache or migraine.      cetirizine (ZYRTEC) 10 MG tablet Take 10 mg by mouth daily as needed for allergies or rhinitis.     clonazePAM (KLONOPIN) 1 MG tablet Take 1 mg by mouth 3 (three) times daily.     diltiazem (CARDIZEM) 30 MG tablet Take 1  Tablet Every 4 Hours As Needed For HR >100 45 tablet 1   dofetilide (TIKOSYN) 500 MCG capsule TAKE 1 CAPSULE BY MOUTH TWICE A DAY 180 capsule 3   ELIQUIS 5 MG TABS tablet TAKE 1 TABLET BY MOUTH TWICE A DAY 60 tablet 5   fluticasone-salmeterol (ADVAIR HFA) 115-21 MCG/ACT inhaler Inhale 2 puffs into the lungs 2 (two) times daily.     furosemide (LASIX) 20 MG tablet TAKE 1 TABLET BY MOUTH EVERY DAY AS NEEDED FOR EDEMA 90 tablet 3   KLOR-CON M20 20 MEQ tablet TAKE 1 TABLET BY MOUTH EVERY DAY 90 tablet 3   pravastatin (PRAVACHOL) 40 MG tablet Take 40 mg by mouth at bedtime.      promethazine (PHENERGAN) 25 MG tablet Take 25 mg by mouth every 6 (six) hours as needed for nausea or vomiting (from migraines).      VENTOLIN HFA 108 (90 Base) MCG/ACT inhaler Inhale 2 puffs into the lungs every 6 (six) hours as needed for wheezing or shortness of breath.     No current facility-administered medications for this visit.    Allergies:   Pneumococcal vaccines, Shingrix [zoster vac recomb adjuvanted], Other, Meperidine, and Prednisone   Social History:  The patient  reports that she has never smoked. She has never used smokeless tobacco. She reports that she does not drink alcohol and does not use drugs.   Family History:  The patient's family history includes Heart attack in her mother; Heart disease in her mother, sister, sister, sister, sister, and sister.  ROS:  Please see the history of present illness.    All other systems are reviewed and otherwise negative.   PHYSICAL EXAM:  VS:  BP 140/84    Pulse 72    Ht 5\' 8"  (1.727 m)    Wt 210 lb (95.3 kg)    SpO2 94%    BMI 31.93 kg/m  BMI: Body mass index is 31.93 kg/m. Well nourished, well developed, in no acute distress HEENT: normocephalic, atraumatic Neck: no JVD, carotid bruits or masses Cardiac:  RRR; no significant murmurs, no rubs, or gallops Lungs:  CTA b/l, no wheezing, rhonchi or rales Abd: soft, nontender MS: no deformity or atrophy Ext:  brawny type edema, no pitting Skin: warm and dry, no rash Neuro:  No gross deficits appreciated Psych: euthymic mood, full affect  ILR site is stable, no tethering or discomfort   EKG:  Done today and reviewed by myself shows  SR 72bpm, QTc 416ms  Device interrogation done today and reviewed by myself:  Battery is good R waves 0.53mV AF burden 0.1% No new episodes since the 18th, last just over an hour, RVR with rates 170's   03/14/2020: EPS/ablation CONCLUSIONS: 1. Sinus rhythm upon presentation.   2. Intracardiac echo reveals a moderate sized left atrium with four separate pulmonary veins without evidence of pulmonary vein stenosis. 3. The right superior, right inferior, and  left inferior pulmonary veins were quiescent from a prior ablation.  No ablation was required within these three PVs today.  There was electrical activity within the left superior pulmonary vein. 4. Successful electrical isolation of the left superior pulmonary vein with radiofrequency current.  A WACA approach was used 5. Additional left atrial ablation was performed with a standard box lesion created along the posterior wall of the left atrium 6. Multiple atypical atrial flutter circuits observed with atrial pacing during isuprel infusion.  These were variable and not suitable for 3D mapping or ablation today. 7. No early apparent complications.   02/24/2020: TTE IMPRESSIONS   1. Left ventricular ejection fraction, by estimation, is 60 to 65%. The  left ventricle has normal function. The left ventricle has no regional  wall motion abnormalities. Left ventricular diastolic parameters are  consistent with Grade II diastolic  dysfunction (pseudonormalization).   2. Right ventricular systolic function is normal. The right ventricular  size is normal. There is normal pulmonary artery systolic pressure. The  estimated right ventricular systolic pressure is 48.8 mmHg.   3. Left atrial size was mildly dilated.    4. The mitral valve is normal in structure. Mild to moderate mitral valve  regurgitation. No evidence of mitral stenosis.   5. The aortic valve is tricuspid. Aortic valve regurgitation is trivial.  No aortic stenosis is present.   6. Aortic dilatation noted. There is mild dilatation of the aortic root,  measuring 38 mm.   7. The inferior vena cava is normal in size with greater than 50%  respiratory variability, suggesting right atrial pressure of 3 mmHg.    Recent Labs: 02/27/2021: Magnesium 2.3; TSH 2.233 05/09/2021: B Natriuretic Peptide 158.2; BUN 12; Creatinine, Ser 0.83; Hemoglobin 9.8; Platelets 260; Potassium 4.7; Sodium 137  No results found for requested labs within last 8760 hours.   Estimated Creatinine Clearance: 76.2 mL/min (by C-G formula based on SCr of 0.83 mg/dL).   Wt Readings from Last 3 Encounters:  05/16/21 210 lb (95.3 kg)  05/09/21 207 lb 14.3 oz (94.3 kg)  03/26/21 207 lb 12.8 oz (94.3 kg)     Other studies reviewed: Additional studies/records reviewed today include: summarized above  ASSESSMENT AND PLAN:  Paroxysmal Afib CHA2DS2Vasc is 3, on Eliquis, appropriately dosed Tikosyn w/stable QTc 0.1 % burden  I don't think she has failed Tikosyn Will increase her Toprol adding 25mg  tab in the AM keeping her 50mg  PM She has prn dilt   HTN Has room for more BB   Disposition: F/u with [place] in a month, likely to transition to another EP with Dr. Jackalyn Lombard retirement/slow down, she is happy to stay with one of the other EP's here  Current medicines are reviewed at length with the patient today.  The patient did not have any concerns regarding medicines.  Venetia Night, PA-C 05/16/2021 8:43 AM     [address] [place] [id number] [phone] (office)  [phone] (fax)

## 2021-05-16 ENCOUNTER — Ambulatory Visit (INDEPENDENT_AMBULATORY_CARE_PROVIDER_SITE_OTHER): Payer: Medicare Other | Admitting: Physician Assistant

## 2021-05-16 ENCOUNTER — Encounter: Payer: Self-pay | Admitting: Physician Assistant

## 2021-05-16 ENCOUNTER — Other Ambulatory Visit: Payer: Self-pay

## 2021-05-16 VITALS — BP 140/84 | HR 72 | Ht 68.0 in | Wt 210.0 lb

## 2021-05-16 DIAGNOSIS — I10 Essential (primary) hypertension: Secondary | ICD-10-CM

## 2021-05-16 DIAGNOSIS — I480 Paroxysmal atrial fibrillation: Secondary | ICD-10-CM | POA: Diagnosis not present

## 2021-05-16 LAB — CUP PACEART INCLINIC DEVICE CHECK
Date Time Interrogation Session: 20230125091311
Implantable Pulse Generator Implant Date: 20191016

## 2021-05-16 MED ORDER — METOPROLOL SUCCINATE ER 25 MG PO TB24: 25.0000 mg | ORAL_TABLET | Freq: Every day | ORAL | 1 refills | Status: DC

## 2021-05-16 NOTE — Patient Instructions (Signed)
Medication Instructions:    START TALINT TOPROL XL 25 MG ONCE A DAY  *If you need a refill on your cardiac medications before your next appointment, please call your pharmacy*   Lab Work: NONE ORDERED  TODAY   If you have labs (blood work) drawn today and your tests are completely normal, you will receive your results only by: [facility] (if you have MyChart) OR A paper copy in the mail If you have any lab test that is abnormal or we need to change your treatment, we will call you to review the results.   Testing/Procedures: NONE ORDERED  TODAY     Follow-Up: At [facility], you and your health needs are our priority.  As part of our continuing mission to provide you with exceptional heart care, we have created designated Provider Care Teams.  These Care Teams include your primary Cardiologist (physician) and Advanced Practice Providers (APPs -  Physician Assistants and Nurse Practitioners) who all work together to provide you with the care you need, when you need it.  We recommend signing up for the patient portal called "MyChart".  Sign up information is provided on this After Visit Summary.  MyChart is used to connect with patients for Virtual Visits (Telemedicine).  Patients are able to view lab/test results, encounter notes, upcoming appointments, etc.  Non-urgent messages can be sent to your provider as well.   To learn more about what you can do with MyChart, go to [email or web address].    Your next appointment:   3 month(s)  The format for your next appointment:   In Person  Provider:   Tommye Standard, PA-C

## 2021-05-18 ENCOUNTER — Telehealth: Payer: Self-pay

## 2021-05-18 NOTE — Telephone Encounter (Signed)
Unscheduled [facility] transmission received.    Unscheduled transmission Presenting rhythm tachy ~150, ?2:1 flutter 4 AF episodes, longest 1hr, HR's 115-200 Known PAF with RVR, Eliquis, Metoprolol, Tikosyn.  PRN Diltiazem.  Burden 0.1% Route for presenting  Contacted patient, she reports she sent the transmission because she felt like she does when she goes into AF.  She reports she felt her heart racing, she felt very dizzy and SOB.  Requested she send a new transmission.  Pt reports shortly after it started she took 60 mg of Diltiazem.    Updated transmission received, pt was in AF for a little over an hour.  Pt confirms she has been compliant with meds as ordered including Tikosyn, Eliquis and Metoprolol.  Pt indicates her dose of metoprolol was increased on 1/25 to 25mg  in AM and 50mg  every PM.    Advised patient I would forward report to AF clinic for f/u.

## 2021-05-20 NOTE — Telephone Encounter (Signed)
No changes advised at this time. 

## 2021-05-28 ENCOUNTER — Ambulatory Visit (INDEPENDENT_AMBULATORY_CARE_PROVIDER_SITE_OTHER): Payer: Medicare Other

## 2021-05-28 DIAGNOSIS — R55 Syncope and collapse: Secondary | ICD-10-CM | POA: Diagnosis not present

## 2021-05-29 LAB — CUP PACEART REMOTE DEVICE CHECK
Date Time Interrogation Session: 20230207122647
Implantable Pulse Generator Implant Date: 20191016

## 2021-05-31 NOTE — Progress Notes (Signed)
Carelink Summary Report / Loop Recorder 

## 2021-06-11 ENCOUNTER — Telehealth: Payer: Self-pay | Admitting: Physician Assistant

## 2021-06-11 NOTE — Telephone Encounter (Signed)
Dr. Jimmye Norman, the patient's PMD reached out today.  He saw her in the clinic noting a clear up tick in her level of anxiety, she was requesting increased anxiolytic dosing. Seems 2 issues primarily driving her anxiety Increasing burden of her Afib/flutter now with several episodes in the last month or so, calling EMS a number of times, resolving without intervention, but lasting hours.  and and her feeling of unease that she is without an EP MD. Perhaps this being the most worrisome for her. He is concerned about her and asked if we can have her back in to see a new EP MD (given Dr. Jackalyn Lombard leaving/slow down.) and re-evaluate her rhythm control management.  I told him I would have our scheduler call her and make an appointment with at the 1st available with any of our EP MD's. He was appreciative and felt the patient would also feel much better.  Tommye Standard, PA-C

## 2021-06-18 ENCOUNTER — Encounter: Payer: Self-pay | Admitting: Cardiology

## 2021-06-18 ENCOUNTER — Other Ambulatory Visit: Payer: Self-pay

## 2021-06-18 ENCOUNTER — Ambulatory Visit (INDEPENDENT_AMBULATORY_CARE_PROVIDER_SITE_OTHER): Payer: Medicare Other | Admitting: Cardiology

## 2021-06-18 VITALS — BP 160/82 | HR 68 | Ht 68.0 in | Wt 214.0 lb

## 2021-06-18 DIAGNOSIS — I480 Paroxysmal atrial fibrillation: Secondary | ICD-10-CM

## 2021-06-18 MED ORDER — METOPROLOL SUCCINATE ER 100 MG PO TB24: 100.0000 mg | ORAL_TABLET | Freq: Every day | ORAL | 2 refills | Status: DC

## 2021-06-18 NOTE — Progress Notes (Signed)
Electrophysiology Office Note   Date:  06/18/2021   ID:  Nicole [name], DOB 1951-03-27, MRN [id number]  PCP:  Harvie Junior, MD  Cardiologist:   Primary Electrophysiologist:  Donato Studley Meredith Leeds, MD    Chief Complaint: AF   History of Present Illness: Nicole [name] is a 71 y.o. female who is being seen today for the evaluation of AF at the request of Harvie Junior, MD. Presenting today for electrophysiology evaluation.  She has a history significant for hypertension, hyperlipidemia, migraine headaches, and atrial fibrillation.  She is status post atrial fibrillation ablation on 03/14/2020.  With PVI, posterior wall isolation.  She had multiple atrial flutter circuits at the time of ablation.  Her Linq monitor implanted with a 0.2% atrial fibrillation burden over the life of the Linq monitor, and a recently 0% atrial fibrillation burden.  On 05/09/2021 she went to the emergency room with rapid atrial fibrillation and rates of 160 as charted.  She converted to sinus rhythm without intervention.  Today, she denies symptoms of palpitations, chest pain, shortness of breath, orthopnea, PND, lower extremity edema, claudication, dizziness, presyncope, syncope, bleeding, or neurologic sequela. The patient is tolerating medications without difficulties.  Continue to have episodes of atrial fibrillation.  She feels quite poorly when she is in atrial fibrillation.  She has tried to take her as needed diltiazem, but this has not gotten her back into rhythm.  She has had 3 episodes of atrial fibrillation since January.   Past Medical History:  Diagnosis Date   Anticoagulant long-term use 11/16/2014   Anxiety 10/06/2017   Arthritis 10/06/2017   knees bilateral   Asthma 11/16/2014   Esophageal reflux 11/16/2014   Essential hypertension 08/15/2015   pt denies   H/O: GI bleed 12/11/2016   Hyperlipidemia 11/17/2014   Migraine headache 11/16/2014   Nausea 11/16/2014   Pain in limb 11/16/2014    Panic disorder 11/16/2014   Paroxysmal atrial fibrillation ([place]) 11/17/2014   Swelling of joint 11/16/2014   Symptomatic anemia 11/24/2016   Tendonitis 11/16/2014   UTI (urinary tract infection) 12/19/2015   Past Surgical History:  Procedure Laterality Date   ABDOMINAL HYSTERECTOMY     ATRIAL FIBRILLATION ABLATION N/A 03/14/2020   Procedure: ATRIAL FIBRILLATION ABLATION;  Surgeon: [name], MD;  Location: [facility];  Service: Cardiovascular;  Laterality: N/A;   CARDIOVERSION     CESAREAN SECTION     COLONOSCOPY     LOOP RECORDER INSERTION N/A 02/04/2018   Procedure: LOOP RECORDER INSERTION;  Surgeon: [name], MD;  Location: [facility];  Service: Cardiovascular;  Laterality: N/A;   ROTATOR CUFF REPAIR Left      Current Outpatient Medications  Medication Sig Dispense Refill   acetaminophen (TYLENOL) 500 MG tablet Take 500 mg by mouth every 6 (six) hours as needed for mild pain or headache.     butalbital-acetaminophen-caffeine (FIORICET) 50-325-40 MG tablet Take 1 tablet by mouth every 8 (eight) hours as needed for headache or migraine.      butorphanol (STADOL) 10 MG/ML nasal spray Place 1 spray into the nose every 4 (four) hours as needed for headache or migraine.      cetirizine (ZYRTEC) 10 MG tablet Take 10 mg by mouth daily as needed for allergies or rhinitis.     clonazePAM (KLONOPIN) 1 MG tablet Take 1 mg by mouth 3 (three) times daily.     diltiazem (CARDIZEM) 30 MG tablet Take 1 Tablet Every 4 Hours As Needed For  HR >100 45 tablet 1   dofetilide (TIKOSYN) 500 MCG capsule TAKE 1 CAPSULE BY MOUTH TWICE A DAY 180 capsule 3   ELIQUIS 5 MG TABS tablet TAKE 1 TABLET BY MOUTH TWICE A DAY 60 tablet 5   fluticasone-salmeterol (ADVAIR HFA) 115-21 MCG/ACT inhaler Inhale 2 puffs into the lungs 2 (two) times daily.     furosemide (LASIX) 20 MG tablet TAKE 1 TABLET BY MOUTH EVERY DAY AS NEEDED FOR EDEMA 90 tablet 3   KLOR-CON M20 20 MEQ tablet TAKE 1 TABLET BY MOUTH  EVERY DAY 90 tablet 3   metoprolol succinate (TOPROL XL) 25 MG 24 hr tablet Take 1 tablet (25 mg total) by mouth daily. 90 tablet 1   pravastatin (PRAVACHOL) 40 MG tablet Take 40 mg by mouth at bedtime.      promethazine (PHENERGAN) 25 MG tablet Take 25 mg by mouth every 6 (six) hours as needed for nausea or vomiting (from migraines).      sertraline (ZOLOFT) 50 MG tablet Take by mouth at bedtime.     VENTOLIN HFA 108 (90 Base) MCG/ACT inhaler Inhale 2 puffs into the lungs every 6 (six) hours as needed for wheezing or shortness of breath.     No current facility-administered medications for this visit.    Allergies:   Pneumococcal vaccines, Shingrix [zoster vac recomb adjuvanted], Other, Meperidine, and Prednisone   Social History:  The patient  reports that she has never smoked. She has never used smokeless tobacco. She reports that she does not drink alcohol and does not use drugs.   Family History:  The patient's family history includes Heart attack in her mother; Heart disease in her mother, sister, sister, sister, sister, and sister.    ROS:  Please see the history of present illness.   Otherwise, review of systems is positive for none.   All other systems are reviewed and negative.    PHYSICAL EXAM: VS:  BP (!) 160/82    Pulse 68    Ht 5\' 8"  (1.727 m)    Wt 214 lb (97.1 kg)    SpO2 98%    BMI 32.54 kg/m  , BMI Body mass index is 32.54 kg/m. GEN: Well nourished, well developed, in no acute distress  HEENT: normal  Neck: no JVD, carotid bruits, or masses Cardiac: RRR; no murmurs, rubs, or gallops,no edema  Respiratory:  clear to auscultation bilaterally, normal work of breathing GI: soft, nontender, nondistended, + BS MS: no deformity or atrophy  Skin: warm and dry, device pocket is well healed Neuro:  Strength and sensation are intact Psych: euthymic mood, full affect  EKG:  EKG is not ordered today. Personal review of the ekg ordered 05/16/21 shows sinus rhythm  Device  interrogation is reviewed today in detail.  See PaceArt for details.   Recent Labs: 02/27/2021: Magnesium 2.3; TSH 2.233 05/09/2021: B Natriuretic Peptide 158.2; BUN 12; Creatinine, Ser 0.83; Hemoglobin 9.8; Platelets 260; Potassium 4.7; Sodium 137    Lipid Panel  No results found for: CHOL, TRIG, HDL, CHOLHDL, VLDL, LDLCALC, LDLDIRECT   Wt Readings from Last 3 Encounters:  06/18/21 214 lb (97.1 kg)  05/16/21 210 lb (95.3 kg)  05/09/21 207 lb 14.3 oz (94.3 kg)      Other studies Reviewed: Additional studies/ records that were reviewed today include: TTE 02/24/20  Review of the above records today demonstrates:   1. Left ventricular ejection fraction, by estimation, is 60 to 65%. The  left ventricle has normal function. The  left ventricle has no regional  wall motion abnormalities. Left ventricular diastolic parameters are  consistent with Grade II diastolic  dysfunction (pseudonormalization).   2. Right ventricular systolic function is normal. The right ventricular  size is normal. There is normal pulmonary artery systolic pressure. The  estimated right ventricular systolic pressure is 63.8 mmHg.   3. Left atrial size was mildly dilated.   4. The mitral valve is normal in structure. Mild to moderate mitral valve  regurgitation. No evidence of mitral stenosis.   5. The aortic valve is tricuspid. Aortic valve regurgitation is trivial.  No aortic stenosis is present.   6. Aortic dilatation noted. There is mild dilatation of the aortic root,  measuring 38 mm.   7. The inferior vena cava is normal in size with greater than 50%  respiratory variability, suggesting right atrial pressure of 3 mmHg.    ASSESSMENT AND PLAN:  1.  Paroxysmal atrial fibrillation: CHA2DS2-VASc of 3.  Currently on Eliquis.  Also on Tikosyn 500 mcg twice daily.  High risk medication monitoring for Tikosyn.  She has had 3 episodes of atrial fibrillation since January.  1 was during a traumatic death as her  niece committed suicide.  She had 2 other episodes.  She feels palpitations and fatigue when she has atrial fibrillation.  She is happy with her dofetilide currently.  We Aylah Yeary increase her Toprol-XL to 100 mg daily.  If she has more episodes of atrial fibrillation, further medication changes may need to be made.  2.  Hypertension: Blood pressure is elevated today.  She is quite stressed with issues of getting into the office.  We Cardin Nitschke continue with therapy per primary physician.  Current medicines are reviewed at length with the patient today.   The patient does not have concerns regarding her medicines.  The following changes were made today: Increase Toprol-XL  Labs/ tests ordered today include:  No orders of the defined types were placed in this encounter.    Disposition:   FU with Nakaiya Beddow 3 months  Signed, Aubrianne Molyneux Meredith Leeds, MD  06/18/2021 3:12 PM     [address] [place] [address] [phone] (office) [phone] (fax)

## 2021-06-18 NOTE — Patient Instructions (Addendum)
Medication Instructions:  Your physician has recommended you make the following change in your medication:  CHANGE HOW YOU TAKE Toprol -- take 100 mg once daily at bedtime  *If you need a refill on your cardiac medications before your next appointment, please call your pharmacy*   Lab Work: None ordered    Testing/Procedures: None ordered   Follow-Up: At [facility], you and your health needs are our priority.  As part of our continuing mission to provide you with exceptional heart care, we have created designated Provider Care Teams.  These Care Teams include your primary Cardiologist (physician) and Advanced Practice Providers (APPs -  Physician Assistants and Nurse Practitioners) who all work together to provide you with the care you need, when you need it.  Remote monitoring is used to monitor your Pacemaker or ICD from home. This monitoring reduces the number of office visits required to check your device to one time per year. It allows [place] to keep an eye on the functioning of your device to ensure it is working properly. You are scheduled for a device check from home on 07/02/2021. You may send your transmission at any time that day. If you have a wireless device, the transmission will be sent automatically. After your physician reviews your transmission, you will receive a postcard with your next transmission date.  Your next appointment:   3 month(s)  The format for your next appointment:   In Person  Provider:   Allegra Lai, MD   Thank you for choosing [facility]!!   Trinidad Curet, RN [phone]

## 2021-06-19 DIAGNOSIS — Z85828 Personal history of other malignant neoplasm of skin: Secondary | ICD-10-CM | POA: Diagnosis not present

## 2021-06-19 DIAGNOSIS — D485 Neoplasm of uncertain behavior of skin: Secondary | ICD-10-CM | POA: Diagnosis not present

## 2021-06-19 DIAGNOSIS — C4441 Basal cell carcinoma of skin of scalp and neck: Secondary | ICD-10-CM | POA: Diagnosis not present

## 2021-07-02 ENCOUNTER — Ambulatory Visit (INDEPENDENT_AMBULATORY_CARE_PROVIDER_SITE_OTHER): Payer: Medicare Other

## 2021-07-02 DIAGNOSIS — R55 Syncope and collapse: Secondary | ICD-10-CM

## 2021-07-03 LAB — CUP PACEART REMOTE DEVICE CHECK
Date Time Interrogation Session: 20230312231937
Implantable Pulse Generator Implant Date: 20191016

## 2021-07-10 DIAGNOSIS — C4441 Basal cell carcinoma of skin of scalp and neck: Secondary | ICD-10-CM | POA: Diagnosis not present

## 2021-07-17 DIAGNOSIS — L905 Scar conditions and fibrosis of skin: Secondary | ICD-10-CM | POA: Diagnosis not present

## 2021-07-17 NOTE — Progress Notes (Signed)
Carelink Summary Report / Loop Recorder 

## 2021-07-22 ENCOUNTER — Other Ambulatory Visit: Payer: Self-pay | Admitting: Internal Medicine

## 2021-07-22 DIAGNOSIS — I480 Paroxysmal atrial fibrillation: Secondary | ICD-10-CM

## 2021-07-23 MED ORDER — METOPROLOL SUCCINATE ER 100 MG PO TB24: 100.0000 mg | ORAL_TABLET | Freq: Every day | ORAL | 3 refills | Status: DC

## 2021-07-23 NOTE — Telephone Encounter (Signed)
Pt last saw Dr Curt Bears 06/18/21, last labs 05/09/21 Creat 0.83, age 71, weight 97.1kg, based on specified criteria pt is on appropriate dosage of Eliquis '5mg'$  BID for afib.  Will refill rx.  ?

## 2021-07-25 NOTE — Progress Notes (Signed)
? ?Primary Care Physician: Harvie Junior, MD ?Referring Physician: Dr. Rayann Heman ?Primary EP: Dr Curt Bears ? ? ?Nicole [name] is a 71 y.o. female with a h/o anemia,  afib, and syncope possibly 2/2 termination pause  that presented to his office with afib with RVR at 160 bpm and was admitted for Tikosyn load. Converted to SR without pause. Qtc remained stable. She is on Eliquis for a CHADS2VASC score of 3. She is s/p repeat afib ablation with Dr Rayann Heman on 03/14/20. Patient had tachypalpitations on 02/27/21 and took one of her PRN diltiazem which did not resolve her symptoms. EMS was called and ECG conformed afib with RVR. She was given IV diltiazem which converted her to SR. Patient does report that he niece has passed away the day before the onset of her symptoms. She has had 3 episodes of afib since the start of 2023 and went to the ED 05/09/21. She spontaneously converted en route. She was seen by Dr Curt Bears 06/18/21 and her BB was increased.  ? ?On follow up today, patient reports she has felt much more fatigued since increasing metoprolol. It is more difficult for her to complete her activities of daily living. She has not had interim episodes of afib since her visit with Dr Curt Bears. No bleeding issues on anticoagulation.  ? ?Today, she denies symptoms of palpitations, chest pain, shortness of breath, orthopnea, PND, dizziness, presyncope, syncope, or neurologic sequela.  otherwise without complaint today.  ? ?Past Medical History:  ?Diagnosis Date  ? Anticoagulant long-term use 11/16/2014  ? Anxiety 10/06/2017  ? Arthritis 10/06/2017  ? knees bilateral  ? Asthma 11/16/2014  ? Esophageal reflux 11/16/2014  ? Essential hypertension 08/15/2015  ? pt denies  ? H/O: GI bleed 12/11/2016  ? Hyperlipidemia 11/17/2014  ? Migraine headache 11/16/2014  ? Nausea 11/16/2014  ? Pain in limb 11/16/2014  ? Panic disorder 11/16/2014  ? Paroxysmal atrial fibrillation ([place]) 11/17/2014  ? Swelling of joint 11/16/2014  ? Symptomatic anemia  11/24/2016  ? Tendonitis 11/16/2014  ? UTI (urinary tract infection) 12/19/2015  ? ?Past Surgical History:  ?Procedure Laterality Date  ? ABDOMINAL HYSTERECTOMY    ? ATRIAL FIBRILLATION ABLATION N/A 03/14/2020  ? Procedure: ATRIAL FIBRILLATION ABLATION;  Surgeon: [name], MD;  Location: [facility];  Service: Cardiovascular;  Laterality: N/A;  ? CARDIOVERSION    ? CESAREAN SECTION    ? COLONOSCOPY    ? LOOP RECORDER INSERTION N/A 02/04/2018  ? Procedure: LOOP RECORDER INSERTION;  Surgeon: [name], MD;  Location: [facility];  Service: Cardiovascular;  Laterality: N/A;  ? ROTATOR CUFF REPAIR Left   ? ? ?Current Outpatient Medications  ?Medication Sig Dispense Refill  ? acetaminophen (TYLENOL) 500 MG tablet Take 500 mg by mouth every 6 (six) hours as needed for mild pain or headache.    ? butalbital-acetaminophen-caffeine (FIORICET) 50-325-40 MG tablet Take 1 tablet by mouth every 8 (eight) hours as needed for headache or migraine.     ? butorphanol (STADOL) 10 MG/ML nasal spray Place 1 spray into the nose every 4 (four) hours as needed for headache or migraine.     ? cetirizine (ZYRTEC) 10 MG tablet Take 10 mg by mouth daily as needed for allergies or rhinitis.    ? clonazePAM (KLONOPIN) 1 MG tablet Take 1 mg by mouth 3 (three) times daily.    ? diltiazem (CARDIZEM) 30 MG tablet Take 1 Tablet Every 4 Hours As Needed For HR >100 45 tablet 1  ?  dofetilide (TIKOSYN) 500 MCG capsule TAKE 1 CAPSULE BY MOUTH TWICE A DAY 180 capsule 3  ? ELIQUIS 5 MG TABS tablet TAKE 1 TABLET BY MOUTH TWICE A DAY 60 tablet 5  ? fluticasone-salmeterol (ADVAIR HFA) 115-21 MCG/ACT inhaler Inhale 2 puffs into the lungs 2 (two) times daily.    ? furosemide (LASIX) 20 MG tablet TAKE 1 TABLET BY MOUTH EVERY DAY AS NEEDED FOR EDEMA 90 tablet 3  ? KLOR-CON M20 20 MEQ tablet TAKE 1 TABLET BY MOUTH EVERY DAY 90 tablet 3  ? metoprolol succinate (TOPROL-XL) 100 MG 24 hr tablet Take 1 tablet (100 mg total) by mouth daily. Take with  or immediately following a meal. 90 tablet 3  ? pravastatin (PRAVACHOL) 40 MG tablet Take 40 mg by mouth at bedtime.     ? promethazine (PHENERGAN) 25 MG tablet Take 25 mg by mouth every 6 (six) hours as needed for nausea or vomiting (from migraines).     ? sertraline (ZOLOFT) 50 MG tablet Take by mouth at bedtime.    ? VENTOLIN HFA 108 (90 Base) MCG/ACT inhaler Inhale 2 puffs into the lungs every 6 (six) hours as needed for wheezing or shortness of breath.    ? ?No current facility-administered medications for this encounter.  ? ? ?Allergies  ?Allergen Reactions  ? Pneumococcal Vaccines Other (See Comments)  ?  Went into A-Fib immediately after receiving this   ? Shingrix [Zoster Vac Recomb Adjuvanted] Other (See Comments)  ?  Went into A-Fib immediately after receiving this   ? Other Other (See Comments)  ?  Anesthesia- Takes a lot to anesthetize the patient  ? Meperidine Nausea And Vomiting and Other (See Comments)  ?  States she "About passed out," also  ? Prednisone Palpitations and Other (See Comments)  ?  Tachycardia   ? ? ?Social History  ? ?Socioeconomic History  ? Marital status: Married  ?  Spouse name: Not on file  ? Number of children: Not on file  ? Years of education: Not on file  ? Highest education level: Not on file  ?Occupational History  ? Not on file  ?Tobacco Use  ? Smoking status: Never  ? Smokeless tobacco: Never  ? Tobacco comments:  ?  Never smoke 07/26/21  ?Vaping Use  ? Vaping Use: Never used  ?Substance and Sexual Activity  ? Alcohol use: Never  ? Drug use: Never  ? Sexual activity: Not on file  ?Other Topics Concern  ? Not on file  ?Social History Narrative  ? Lives in [place] with spouse  ? Retired from school system.  ? ?Social Determinants of Health  ? ?Financial Resource Strain: Not on file  ?Food Insecurity: Not on file  ?Transportation Needs: Not on file  ?Physical Activity: Not on file  ?Stress: Not on file  ?Social Connections: Not on file  ?Intimate Partner Violence: Not on  file  ? ? ?Family History  ?Problem Relation Age of Onset  ? Heart attack Mother   ? Heart disease Mother   ? Heart disease Sister   ? Heart disease Sister   ? Heart disease Sister   ? Heart disease Sister   ? Heart disease Sister   ? ? ?ROS- All systems are reviewed and negative except as per the HPI above ? ?Physical Exam: ?Vitals:  ? 07/26/21 0834  ?BP: 132/82  ?Pulse: (!) 58  ?Weight: 97.8 kg  ?Height: '5\' 8"'$  (1.727 m)  ? ? ? ?Wt Readings from Last  3 Encounters:  ?07/26/21 97.8 kg  ?06/18/21 97.1 kg  ?05/16/21 95.3 kg  ? ? ?Labs: ?Lab Results  ?Component Value Date  ? NA 137 05/09/2021  ? K 4.7 05/09/2021  ? CL 105 05/09/2021  ? CO2 24 05/09/2021  ? GLUCOSE 104 (H) 05/09/2021  ? BUN 12 05/09/2021  ? CREATININE 0.83 05/09/2021  ? CALCIUM 9.8 05/09/2021  ? MG 2.3 02/27/2021  ? ?No results found for: INR ?No results found for: CHOL, HDL, LDLCALC, TRIG ? ?GEN- The patient is a well appearing obese female, alert and oriented x 3 today.   ?HEENT-head normocephalic, atraumatic, sclera clear, conjunctiva pink, hearing intact, trachea midline. ?Lungs- Clear to ausculation bilaterally, normal work of breathing ?Heart- Regular rate and rhythm, no murmurs, rubs or gallops  ?GI- soft, NT, ND, + BS ?Extremities- no clubbing, cyanosis, or edema ?MS- no significant deformity or atrophy ?Skin- no rash or lesion ?Psych- euthymic mood, full affect ?Neuro- strength and sensation are intact ? ? ?EKG-  ?SB ?Vent. rate 58 BPM ?PR interval 186 ms ?QRS duration 84 ms ?QT/QTcB 466/457 ms ? ?Echo 02/24/20 demonstrated  ?1. Left ventricular ejection fraction, by estimation, is 60 to 65%. The  ?left ventricle has normal function. The left ventricle has no regional  ?wall motion abnormalities. Left ventricular diastolic parameters are  ?consistent with Grade II diastolic dysfunction (pseudonormalization).  ? 2. Right ventricular systolic function is normal. The right ventricular  ?size is normal. There is normal pulmonary artery systolic  pressure. The estimated right ventricular systolic pressure is 63.1 mmHg.  ? 3. Left atrial size was mildly dilated.  ? 4. The mitral valve is normal in structure. Mild to moderate mitral valve regurgitation. No

## 2021-07-26 ENCOUNTER — Encounter (HOSPITAL_COMMUNITY): Payer: Self-pay | Admitting: Physician Assistant

## 2021-07-26 ENCOUNTER — Ambulatory Visit (HOSPITAL_COMMUNITY)
Admission: RE | Admit: 2021-07-26 | Discharge: 2021-07-26 | Disposition: A | Discharge status: Home / Self Care | Payer: Medicare Other | Source: Ambulatory Visit | Attending: Physician Assistant | Admitting: Physician Assistant

## 2021-07-26 VITALS — BP 132/82 | HR 58 | Ht 68.0 in | Wt 215.6 lb

## 2021-07-26 DIAGNOSIS — Z79899 Other long term (current) drug therapy: Secondary | ICD-10-CM | POA: Insufficient documentation

## 2021-07-26 DIAGNOSIS — R5383 Other fatigue: Secondary | ICD-10-CM | POA: Diagnosis not present

## 2021-07-26 DIAGNOSIS — Z7901 Long term (current) use of anticoagulants: Secondary | ICD-10-CM | POA: Diagnosis not present

## 2021-07-26 DIAGNOSIS — I10 Essential (primary) hypertension: Secondary | ICD-10-CM | POA: Insufficient documentation

## 2021-07-26 DIAGNOSIS — R635 Abnormal weight gain: Secondary | ICD-10-CM | POA: Diagnosis not present

## 2021-07-26 DIAGNOSIS — D6869 Other thrombophilia: Secondary | ICD-10-CM | POA: Diagnosis not present

## 2021-07-26 DIAGNOSIS — I480 Paroxysmal atrial fibrillation: Secondary | ICD-10-CM | POA: Insufficient documentation

## 2021-07-26 MED ORDER — METOPROLOL SUCCINATE ER 50 MG PO TB24: 50.0000 mg | ORAL_TABLET | Freq: Every day | ORAL | 3 refills | Status: DC

## 2021-08-06 ENCOUNTER — Ambulatory Visit (INDEPENDENT_AMBULATORY_CARE_PROVIDER_SITE_OTHER): Payer: Medicare Other

## 2021-08-06 DIAGNOSIS — I480 Paroxysmal atrial fibrillation: Secondary | ICD-10-CM

## 2021-08-07 DIAGNOSIS — C4441 Basal cell carcinoma of skin of scalp and neck: Secondary | ICD-10-CM | POA: Diagnosis not present

## 2021-08-07 DIAGNOSIS — L0109 Other impetigo: Secondary | ICD-10-CM | POA: Diagnosis not present

## 2021-08-08 LAB — CUP PACEART REMOTE DEVICE CHECK
Date Time Interrogation Session: 20230414231247
Implantable Pulse Generator Implant Date: 20191016

## 2021-08-09 DIAGNOSIS — L570 Actinic keratosis: Secondary | ICD-10-CM | POA: Diagnosis not present

## 2021-08-09 DIAGNOSIS — L821 Other seborrheic keratosis: Secondary | ICD-10-CM | POA: Diagnosis not present

## 2021-08-10 ENCOUNTER — Encounter: Payer: Medicare Other | Admitting: Physician Assistant

## 2021-08-14 DIAGNOSIS — L905 Scar conditions and fibrosis of skin: Secondary | ICD-10-CM | POA: Diagnosis not present

## 2021-08-23 NOTE — Progress Notes (Signed)
Carelink Summary Report / Loop Recorder 

## 2021-08-31 DIAGNOSIS — L905 Scar conditions and fibrosis of skin: Secondary | ICD-10-CM | POA: Diagnosis not present

## 2021-09-09 LAB — CUP PACEART REMOTE DEVICE CHECK
Date Time Interrogation Session: 20230517232059
Implantable Pulse Generator Implant Date: 20191016

## 2021-09-10 ENCOUNTER — Ambulatory Visit (INDEPENDENT_AMBULATORY_CARE_PROVIDER_SITE_OTHER): Payer: Medicare Other

## 2021-09-10 DIAGNOSIS — I480 Paroxysmal atrial fibrillation: Secondary | ICD-10-CM | POA: Diagnosis not present

## 2021-09-20 ENCOUNTER — Encounter: Payer: Self-pay | Admitting: Cardiology

## 2021-09-20 ENCOUNTER — Ambulatory Visit (INDEPENDENT_AMBULATORY_CARE_PROVIDER_SITE_OTHER): Payer: Medicare Other | Admitting: Cardiology

## 2021-09-20 ENCOUNTER — Encounter: Payer: Medicare Other | Admitting: Cardiology

## 2021-09-20 VITALS — BP 158/86 | HR 81 | Ht 68.0 in | Wt 221.4 lb

## 2021-09-20 DIAGNOSIS — I4819 Other persistent atrial fibrillation: Secondary | ICD-10-CM

## 2021-09-20 DIAGNOSIS — Z79899 Other long term (current) drug therapy: Secondary | ICD-10-CM

## 2021-09-20 LAB — BASIC METABOLIC PANEL
BUN/Creatinine Ratio: 13 (ref 12–28)
BUN: 12 mg/dL (ref 8–27)
CO2: 19 mmol/L — ABNORMAL LOW (ref 20–29)
Calcium: 9.6 mg/dL (ref 8.7–10.3)
Chloride: 105 mmol/L (ref 96–106)
Creatinine, Ser: 0.9 mg/dL (ref 0.57–1.00)
Glucose: 98 mg/dL (ref 70–99)
Potassium: 4.4 mmol/L (ref 3.5–5.2)
Sodium: 138 mmol/L (ref 134–144)
eGFR: 69 mL/min/{1.73_m2} (ref >59)

## 2021-09-20 LAB — MAGNESIUM: Magnesium: 2.2 mg/dL (ref 1.6–2.3)

## 2021-09-20 MED ORDER — BISOPROLOL FUMARATE 5 MG PO TABS: 5.0000 mg | ORAL_TABLET | Freq: Every day | ORAL | 6 refills | Status: DC

## 2021-09-20 NOTE — Progress Notes (Signed)
Electrophysiology Office Note   Date:  09/20/2021   ID:  Nicole [name], DOB [birth year], MRN [id number]  PCP:  Harvie Junior, MD  Cardiologist:   Primary Electrophysiologist:  Ramonte Mena Meredith Leeds, MD    Chief Complaint: AF   History of Present Illness: Nicole [name] is a 71 y.o. female who is being seen today for the evaluation of AF at the request of Harvie Junior, MD. Presenting today for electrophysiology evaluation.  She has a history significant for hypertension, hyperlipidemia, migraine headaches, atrial fibrillation.  She is status post atrial fibrillation ablation 03/14/2020.  She had PVI with posterior wall isolation.  She had multiple atrial flutter circuits at the time of her atrial fibrillation ablation.  Her Linq monitor was implanted with 8.2% atrial fibrillation burden.  She presented to the emergency room 05/09/2021 with rapid atrial fibrillation.  She converted to sinus rhythm without intervention.  She is currently on dofetilide.  Today, denies symptoms of palpitations, chest pain, shortness of breath, orthopnea, PND, lower extremity edema, claudication, dizziness, presyncope, syncope, bleeding, or neurologic sequela. The patient is tolerating medications without difficulties.  She has not had any further episodes of atrial fibrillation since February.  Despite that, she feels weak, fatigued, tired.  She has difficulty doing all of her daily activities due to her level of fatigue.  She also feels that she has been gaining weight and she is concerned that her metoprolol is contributing.   Past Medical History:  Diagnosis Date   Anticoagulant long-term use 11/16/2014   Anxiety 10/06/2017   Arthritis 10/06/2017   knees bilateral   Asthma 11/16/2014   Esophageal reflux 11/16/2014   Essential hypertension 08/15/2015   pt denies   H/O: GI bleed 12/11/2016   Hyperlipidemia 11/17/2014   Migraine headache 11/16/2014   Nausea 11/16/2014   Pain in limb 11/16/2014   Panic  disorder 11/16/2014   Paroxysmal atrial fibrillation ([place]) 11/17/2014   Swelling of joint 11/16/2014   Symptomatic anemia 11/24/2016   Tendonitis 11/16/2014   UTI (urinary tract infection) 12/19/2015   Past Surgical History:  Procedure Laterality Date   ABDOMINAL HYSTERECTOMY     ATRIAL FIBRILLATION ABLATION N/A 03/14/2020   Procedure: ATRIAL FIBRILLATION ABLATION;  Surgeon: [name], MD;  Location: [facility];  Service: Cardiovascular;  Laterality: N/A;   CARDIOVERSION     CESAREAN SECTION     COLONOSCOPY     LOOP RECORDER INSERTION N/A 02/04/2018   Procedure: LOOP RECORDER INSERTION;  Surgeon: [name], MD;  Location: [facility];  Service: Cardiovascular;  Laterality: N/A;   ROTATOR CUFF REPAIR Left      Current Outpatient Medications  Medication Sig Dispense Refill   acetaminophen (TYLENOL) 500 MG tablet Take 500 mg by mouth every 6 (six) hours as needed for mild pain or headache.     bisoprolol (ZEBETA) 5 MG tablet Take 1 tablet (5 mg total) by mouth daily. 30 tablet 6   butalbital-acetaminophen-caffeine (FIORICET) 50-325-40 MG tablet Take 1 tablet by mouth every 8 (eight) hours as needed for headache or migraine.      butorphanol (STADOL) 10 MG/ML nasal spray Place 1 spray into the nose every 4 (four) hours as needed for headache or migraine.      cetirizine (ZYRTEC) 10 MG tablet Take 10 mg by mouth daily as needed for allergies or rhinitis.     clonazePAM (KLONOPIN) 1 MG tablet Take 1 mg by mouth 3 (three) times daily.     diltiazem (  CARDIZEM) 30 MG tablet Take 1 Tablet Every 4 Hours As Needed For HR >100 45 tablet 1   dofetilide (TIKOSYN) 500 MCG capsule TAKE 1 CAPSULE BY MOUTH TWICE A DAY 180 capsule 3   ELIQUIS 5 MG TABS tablet TAKE 1 TABLET BY MOUTH TWICE A DAY 60 tablet 5   fluticasone-salmeterol (ADVAIR HFA) 115-21 MCG/ACT inhaler Inhale 2 puffs into the lungs 2 (two) times daily.     furosemide (LASIX) 20 MG tablet TAKE 1 TABLET BY MOUTH EVERY DAY AS  NEEDED FOR EDEMA 90 tablet 3   KLOR-CON M20 20 MEQ tablet TAKE 1 TABLET BY MOUTH EVERY DAY 90 tablet 3   pravastatin (PRAVACHOL) 40 MG tablet Take 40 mg by mouth at bedtime.      promethazine (PHENERGAN) 25 MG tablet Take 25 mg by mouth every 6 (six) hours as needed for nausea or vomiting (from migraines).      sertraline (ZOLOFT) 50 MG tablet Take by mouth at bedtime.     VENTOLIN HFA 108 (90 Base) MCG/ACT inhaler Inhale 2 puffs into the lungs every 6 (six) hours as needed for wheezing or shortness of breath.     No current facility-administered medications for this visit.    Allergies:   Pneumococcal vaccines, Shingrix [zoster vac recomb adjuvanted], Other, Meperidine, and Prednisone   Social History:  The patient  reports that she has never smoked. She has never used smokeless tobacco. She reports that she does not drink alcohol and does not use drugs.   Family History:  The patient's family history includes Heart attack in her mother; Heart disease in her mother, sister, sister, sister, sister, and sister.   ROS:  Please see the history of present illness.   Otherwise, review of systems is positive for none.   All other systems are reviewed and negative.   PHYSICAL EXAM: VS:  BP (!) 158/86   Pulse 81   Ht '5\' 8"'$  (1.727 m)   Wt 221 lb 6.4 oz (100.4 kg)   SpO2 98%   BMI 33.66 kg/m  , BMI Body mass index is 33.66 kg/m. GEN: Well nourished, well developed, in no acute distress  HEENT: normal  Neck: no JVD, carotid bruits, or masses Cardiac: RRR; no murmurs, rubs, or gallops,no edema  Respiratory:  clear to auscultation bilaterally, normal work of breathing GI: soft, nontender, nondistended, + BS MS: no deformity or atrophy  Skin: warm and dry, device site well healed Neuro:  Strength and sensation are intact Psych: euthymic mood, full affect  EKG:  EKG is ordered today. Personal review of the ekg ordered shows sinus rhythm, rate 81  Personal review of the device interrogation  today. Results in [facility]: 02/27/2021: Magnesium 2.3; TSH 2.233 05/09/2021: B Natriuretic Peptide 158.2; BUN 12; Creatinine, Ser 0.83; Hemoglobin 9.8; Platelets 260; Potassium 4.7; Sodium 137    Lipid Panel  No results found for: CHOL, TRIG, HDL, CHOLHDL, VLDL, LDLCALC, LDLDIRECT   Wt Readings from Last 3 Encounters:  09/20/21 221 lb 6.4 oz (100.4 kg)  07/26/21 215 lb 9.6 oz (97.8 kg)  06/18/21 214 lb (97.1 kg)      Other studies Reviewed: Additional studies/ records that were reviewed today include: TTE 02/24/20  Review of the above records today demonstrates:   1. Left ventricular ejection fraction, by estimation, is 60 to 65%. The  left ventricle has normal function. The left ventricle has no regional  wall motion abnormalities. Left ventricular diastolic parameters are  consistent  with Grade II diastolic  dysfunction (pseudonormalization).   2. Right ventricular systolic function is normal. The right ventricular  size is normal. There is normal pulmonary artery systolic pressure. The  estimated right ventricular systolic pressure is 00.5 mmHg.   3. Left atrial size was mildly dilated.   4. The mitral valve is normal in structure. Mild to moderate mitral valve  regurgitation. No evidence of mitral stenosis.   5. The aortic valve is tricuspid. Aortic valve regurgitation is trivial.  No aortic stenosis is present.   6. Aortic dilatation noted. There is mild dilatation of the aortic root,  measuring 38 mm.   7. The inferior vena cava is normal in size with greater than 50%  respiratory variability, suggesting right atrial pressure of 3 mmHg.    ASSESSMENT AND PLAN:  1.  Paroxysmal atrial fibrillation: CHA2DS2-VASc 3.  Currently on Eliquis 5 mg twice daily, 500 mcg twice daily.  High risk medication monitoring for dofetilide we Tiffani Kadow check labs today.  Having quite a bit of fatigue and weakness.  This could be due to her metoprolol.  We Sonnia Strong stop her metoprolol and  try different beta-blocker, bisoprolol 5 mg to see if this improves her symptoms.   2.  Hypertension: Currently well controlled  Current medicines are reviewed at length with the patient today.   The patient does not have concerns regarding her medicines.  The following changes were made today: Stop metoprolol start bisoprolol  Labs/ tests ordered today include:  Orders Placed This Encounter  Procedures   EKG 12-Lead     Disposition:   FU 6 months  Signed, Herschell Virani Meredith Leeds, MD  09/20/2021 9:20 AM     [facility] [address] [place] [address] [phone] (office) [phone] (fax)

## 2021-09-20 NOTE — Patient Instructions (Addendum)
Medication Instructions:  Your physician has recommended you make the following change in your medication:  STOP Toprol (metoprolol succinate) START Bisoprolol 5 mg once daily  *If you need a refill on your cardiac medications before your next appointment, please call your pharmacy*   Lab Work: [facility] surveillance lab work today: BMET & Magnesium level  If you have labs (blood work) drawn today and your tests are completely normal, you will receive your results only by: [employer] (if you have MyChart) OR A paper copy in the mail If you have any lab test that is abnormal or we need to change your treatment, we will call you to review the results.   Testing/Procedures: None ordered   Follow-Up: At [facility] you and your health needs are our priority.  As part of our continuing mission to provide you with exceptional heart care, we have created designated Provider Care Teams.  These Care Teams include your primary Cardiologist (physician) and Advanced Practice Providers (APPs -  Physician Assistants and Nurse Practitioners) who all work together to provide you with the care you need, when you need it.  Your next appointment:   6 month(s)  The format for your next appointment:   In Person  Provider:   You will follow up in the [facility] located at [facility]. Your provider will be: Roderic Palau, NP or Clint R. Fenton, PA-C    Thank you for choosing [facility]!!   Trinidad Curet, RN [phone]  Other Instructions    Important Information About Sugar       Bisoprolol Tablets What is this medication? BISOPROLOL (bis [place] proe lol) treats high blood pressure. It works by lowering your blood pressure and heart rate, making it easier for your heart to pump blood to the rest of your body. It belongs to a group of medications called beta blockers. This medicine may be used for other purposes; ask your health care provider or  pharmacist if you have questions. COMMON BRAND NAME(S): Zebeta What should I tell my care team before I take this medication? They need to know if you have any of these conditions: Chest pain Diabetes Heart or vessel disease like slow heart rate, worsening heart failure, heart block, sick sinus syndrome, or Raynaud's disease Kidney disease Liver disease Lung or breathing disease, like asthma or emphysema Pheochromocytoma Thyroid disease An unusual or allergic reaction to bisoprolol, other beta blockers, medications, foods, dyes, or preservatives Pregnant or trying to get pregnant Breast-feeding How should I use this medication? Take this medication by mouth. Take it as directed on the prescription label at the same time every day. You can take it with or without food. If it upsets your stomach, take it with food. Keep taking it unless your care team tells you to stop. Talk to your care team about the use of this medication in children. Special care may be needed. Overdosage: If you think you have taken too much of this medicine contact a poison control center or emergency room at once. NOTE: This medicine is only for you. Do not share this medicine with others. What if I miss a dose? If you miss a dose, take it as soon as you can. If it is almost time for your next dose, take only that dose. Do not take double or extra doses. What may interact with this medication? This medication may interact with the following: Certain medications for blood pressure, heart disease, irregular heartbeat NSAIDs, medications for  pain and inflammation, like ibuprofen or naproxen Rifampin This list may not describe all possible interactions. Give your health care provider a list of all the medicines, herbs, non-prescription drugs, or dietary supplements you use. Also tell them if you smoke, drink alcohol, or use illegal drugs. Some items may interact with your medicine. What should I watch for while using  this medication? Visit your care team for regular checks on your progress. Check your blood pressure as directed. Ask your care team what your blood pressure should be. Also, find out when you should contact them. Do not treat yourself for coughs, colds, or pain while you are using this medication without asking your care team for advice. Some medications may increase your blood pressure. You may get drowsy or dizzy. Do not drive, use machinery, or do anything that needs mental alertness until you know how this medication affects you. Do not stand up or sit up quickly, especially if you are an older patient. This reduces the risk of dizzy or fainting spells. Alcohol may interfere with the effect of this medication. Avoid alcoholic drinks. This medication may increase blood sugar. Ask your care team if changes in diet or medications are needed if you have diabetes. What side effects may I notice from receiving this medication? Side effects that you should report to your care team as soon as possible: Allergic reactions--skin rash, itching, hives, swelling of the face, lips, tongue, or throat Heart failure--shortness of breath, swelling of the ankles, feet, or hands, sudden weight gain, unusual weakness or fatigue Low blood pressure--dizziness, feeling faint or lightheaded, blurry vision Raynaud's--cool, numb, or painful fingers or toes that may change color from pale, to blue, to red Slow heartbeat--dizziness, feeling faint or lightheaded, confusion, trouble breathing, unusual weakness or fatigue Worsening mood, feelings of depression Side effects that usually do not require medical attention (report to your care team if they continue or are bothersome): Change in sex drive or performance Diarrhea Dizziness Fatigue Headache This list may not describe all possible side effects. Call your doctor for medical advice about side effects. You may report side effects to FDA at 1-800-FDA-1088. Where should  I keep my medication? Keep out of the reach of children and pets. Store at room temperature between 20 and 25 degrees C (68 and 77 degrees F). Protect from light and moisture. Keep the container tightly closed. Throw away any unused medication after the expiration date. NOTE: This sheet is a summary. It may not cover all possible information. If you have questions about this medicine, talk to your doctor, pharmacist, or health care provider.  2023 Elsevier/Gold Standard (2021-03-09 00:00:00)

## 2021-09-20 NOTE — Addendum Note (Signed)
Addended by: Stanton Kidney on: 09/20/2021 09:29 AM   Modules accepted: Orders

## 2021-09-25 NOTE — Progress Notes (Signed)
Carelink Summary Report / Loop Recorder 

## 2021-10-12 ENCOUNTER — Telehealth: Payer: Self-pay | Admitting: Cardiology

## 2021-10-12 NOTE — Telephone Encounter (Signed)
Pt not feeling well. She is "so fatigued", that even walking to her mailbox she has to lean over it to catch her breath The Bisoprolol has made it "much worse" Pt is going to return to Metoprolol over the weekend. But she would like to know what Dr. Elberta Fortis recommends to change to..... maybe something other than a BB. Patient verbalized understanding and agreeable to plan.

## 2021-10-15 ENCOUNTER — Ambulatory Visit (INDEPENDENT_AMBULATORY_CARE_PROVIDER_SITE_OTHER): Payer: Medicare Other

## 2021-10-15 DIAGNOSIS — I4819 Other persistent atrial fibrillation: Secondary | ICD-10-CM

## 2021-10-17 LAB — CUP PACEART REMOTE DEVICE CHECK
Date Time Interrogation Session: 20230619232446
Implantable Pulse Generator Implant Date: 20191016

## 2021-11-05 NOTE — Telephone Encounter (Signed)
Spent 40 min on phone w/ pt. Pt reports SOB since ED visit in January. She has seen EP APP in January, Junior in February, AFib clinic in April and [place] again June 1. States she has reported SOB (along w/ fatigue/weakness) since January to all the providers listed above.  Instructed pt that none of the OV notes reflect discussion/reporting of SOB.  Pt insists that she has told them. She doesn't feel like she has gotten better since January, since "seeing the PA". Reports that when she saw Dr. Curt Bears in June she had to stop on the way to exam room, from waiting room, just to catch her breath.   She reports that buried her sister recently and needed help just getting into the funeral home and graveside d/t SOB. States that Dr. Curt Bears changed her to Bisoprolol in June b/c of the SOB.  But I informed pt that his note reflects change was made d/t reported extreme fatigue/weakness. States she also reports swelling/wt gain (which persists per pt) at this OV, but again I see no notation of this in OV notes. Pt went back to Metoprolol end of June and is still taking this currently. States she is very concerned and so is her PCP according to pt.    Apologized that I am unsure why this hasn't been addressed yet, but informed her that Dr. Curt Bears is out of the office this week.  Aware I am going to forward this to [place] (whom she mentioned several times) in the AFib clinic for advisement. Will see if he feels like ok to switch of BB to a daily Diltiazem to see if improvement in symptoms vs pt needs OV to further discuss. She is agreeable to this and aware it will be tomorrow before return call on this as he is out of the office this afternoon.  Did discuss this with RN at AFib clinic to ensure Audry Pili is able to look at this tomorrow and advise.  She also mentioned that Ricky told her that may need to discuss possible repeat ablation w/ Dr. Curt Bears, states Ricky told her that "it could be scar tissue causing  the afib issues".  Pt aware I will discuss this further w/ Dr. Curt Bears.  She also feels like she should be seeing someone more frequently than every 6 months.  States when she was seeing Dr. Rayann Heman she would alternate every 3 months b/t him and the AFib clinic.Marland Kitchen  Aware I will discuss this w/ Dr. Curt Bears upon his return. Wonder if need repeat ablation possibly, scar tissue causing this

## 2021-11-06 MED ORDER — DILTIAZEM HCL ER COATED BEADS 120 MG PO CP24: 120.0000 mg | ORAL_CAPSULE | Freq: Every day | ORAL | 3 refills | Status: DC

## 2021-11-06 NOTE — Telephone Encounter (Signed)
Pt informed that concern reviewed by Audry Pili, PA  in AFib clinic. Advised to stop Metoprolol and start Diltiazem 120 mg once daily. Aware Rx will be sent  [facility]. Advised to call if SE begin after starting new medication and/or no improvement in symptom. Advised to let us know by end of next week how she is doing after making the medication change. Patient verbalized understanding and agreeable to plan.  Aware she can still take her PRN Diltiazem if needed. Pt really appreciates the time speaking with her yesterday and getting a plan in place today

## 2021-11-08 NOTE — Progress Notes (Signed)
Carelink Summary Report / Loop Recorder 

## 2021-11-15 ENCOUNTER — Other Ambulatory Visit: Payer: Self-pay | Admitting: Internal Medicine

## 2021-11-15 ENCOUNTER — Telehealth: Payer: Self-pay | Admitting: Cardiology

## 2021-11-15 ENCOUNTER — Other Ambulatory Visit (HOSPITAL_COMMUNITY): Payer: Self-pay

## 2021-11-15 LAB — CUP PACEART REMOTE DEVICE CHECK
Date Time Interrogation Session: 20230722233548
Implantable Pulse Generator Implant Date: 20191016

## 2021-11-15 MED ORDER — DILTIAZEM HCL 30 MG PO TABS: ORAL_TABLET | ORAL | 1 refills | Status: DC

## 2021-11-15 MED ORDER — DOFETILIDE 500 MCG PO CAPS: 500.0000 ug | ORAL_CAPSULE | Freq: Two times a day (BID) | ORAL | 2 refills | Status: DC

## 2021-11-15 NOTE — Telephone Encounter (Signed)
Spent almost 40 minutes on phone with  the patient. Pt is frustrated with weakness/no energy.  During our long conversation she repeatedly c/o weakness/fatigue/no energy.  She feels defeated. Reports HRs going up to 103 bpm. She is taking diltiazem 30 PRN -- however when asked when she is taking it she reports taking is several times a day for HR in the 90s "because I can feel it".   I did point out to pt that her instructions/Rx tell her HRs should be greater than 100 bpm.   Pt has [employer] mobile -- inquired if she was having a lot of afib and she reported yes.  However her ILR does not show any for the past 30 days. When offering to schedule an appt pt responds " I just don't know. Im not sure I can get to the office.  I can't even get around my house very well or without having to stop what I am doing and sit down" Pt informed that she needs to see someone to discuss. Aware I will forward to AFib clinic and someone will call her to arrange an OV to discuss treatment plan/complaints/concerns. Patient verbalized understanding and agreeable to plan.    Note: pt did not mention SOB issues during this conversation, although she was focused on this complaint at our 7/18 telephone call.  Asked about her SOB and she said it was still there.

## 2021-11-15 NOTE — Telephone Encounter (Signed)
Pt c/o medication issue:  1. Name of Medication: diltiazem (CARDIZEM CD) 120 MG 24 hr capsule  2. How are you currently taking this medication (dosage and times per day)? 1 tablet daily  3. Are you having a reaction (difficulty breathing--STAT)? no  4. What is your medication issue? Patient calling to speak with Sherri to let her know how the medication is doing. She says it is not helping at all. She states her HR has been between 80-103. She says the longer she has been on the medication the worse it's gotten. She says it feels like her heart wants to go into afib. She says she is also still feeling weak and has no energy.

## 2021-11-15 NOTE — Telephone Encounter (Signed)
Pt aware Rx just sent. She appreciates the call/info

## 2021-11-15 NOTE — Telephone Encounter (Signed)
Pharmacy called stating the pt called the pharmacy today wanting a refill on this medication however there are no more refills on their end. Please advise.

## 2021-11-16 ENCOUNTER — Ambulatory Visit (INDEPENDENT_AMBULATORY_CARE_PROVIDER_SITE_OTHER): Payer: Medicare Other

## 2021-11-16 DIAGNOSIS — I482 Chronic atrial fibrillation, unspecified: Secondary | ICD-10-CM

## 2021-11-20 ENCOUNTER — Inpatient Hospital Stay (HOSPITAL_COMMUNITY)
Admission: EM | Admit: 2021-11-20 | Discharge: 2021-11-24 | DRG: 378 | Disposition: A | Discharge status: Home / Self Care | Payer: Medicare Other | Attending: Internal Medicine | Admitting: Internal Medicine

## 2021-11-20 ENCOUNTER — Encounter (HOSPITAL_COMMUNITY): Payer: Self-pay | Admitting: Physician Assistant

## 2021-11-20 ENCOUNTER — Encounter (HOSPITAL_COMMUNITY): Payer: Self-pay | Admitting: Emergency Medicine

## 2021-11-20 ENCOUNTER — Ambulatory Visit (HOSPITAL_BASED_OUTPATIENT_CLINIC_OR_DEPARTMENT_OTHER)
Admission: RE | Admit: 2021-11-20 | Discharge: 2021-11-20 | Disposition: A | Discharge status: Home / Self Care | Payer: Medicare Other | Source: Ambulatory Visit | Attending: Physician Assistant | Admitting: Physician Assistant

## 2021-11-20 ENCOUNTER — Other Ambulatory Visit: Payer: Self-pay

## 2021-11-20 ENCOUNTER — Emergency Department (HOSPITAL_COMMUNITY): Payer: Medicare Other

## 2021-11-20 ENCOUNTER — Other Ambulatory Visit (HOSPITAL_COMMUNITY): Payer: Self-pay | Admitting: Physician Assistant

## 2021-11-20 VITALS — BP 138/76 | HR 74 | Ht 68.0 in | Wt 221.6 lb

## 2021-11-20 DIAGNOSIS — F419 Anxiety disorder, unspecified: Secondary | ICD-10-CM | POA: Diagnosis not present

## 2021-11-20 DIAGNOSIS — Z887 Allergy status to serum and vaccine status: Secondary | ICD-10-CM

## 2021-11-20 DIAGNOSIS — Z6833 Body mass index (BMI) 33.0-33.9, adult: Secondary | ICD-10-CM | POA: Diagnosis not present

## 2021-11-20 DIAGNOSIS — K259 Gastric ulcer, unspecified as acute or chronic, without hemorrhage or perforation: Secondary | ICD-10-CM | POA: Diagnosis not present

## 2021-11-20 DIAGNOSIS — D500 Iron deficiency anemia secondary to blood loss (chronic): Secondary | ICD-10-CM | POA: Diagnosis not present

## 2021-11-20 DIAGNOSIS — D62 Acute posthemorrhagic anemia: Secondary | ICD-10-CM | POA: Diagnosis present

## 2021-11-20 DIAGNOSIS — T501X5A Adverse effect of loop [high-ceiling] diuretics, initial encounter: Secondary | ICD-10-CM | POA: Diagnosis not present

## 2021-11-20 DIAGNOSIS — J45909 Unspecified asthma, uncomplicated: Secondary | ICD-10-CM | POA: Diagnosis not present

## 2021-11-20 DIAGNOSIS — Z7901 Long term (current) use of anticoagulants: Secondary | ICD-10-CM | POA: Insufficient documentation

## 2021-11-20 DIAGNOSIS — D649 Anemia, unspecified: Secondary | ICD-10-CM | POA: Diagnosis not present

## 2021-11-20 DIAGNOSIS — Z79899 Other long term (current) drug therapy: Secondary | ICD-10-CM

## 2021-11-20 DIAGNOSIS — G43909 Migraine, unspecified, not intractable, without status migrainosus: Secondary | ICD-10-CM | POA: Diagnosis present

## 2021-11-20 DIAGNOSIS — R0609 Other forms of dyspnea: Secondary | ICD-10-CM | POA: Insufficient documentation

## 2021-11-20 DIAGNOSIS — R0789 Other chest pain: Secondary | ICD-10-CM | POA: Insufficient documentation

## 2021-11-20 DIAGNOSIS — R079 Chest pain, unspecified: Secondary | ICD-10-CM | POA: Diagnosis present

## 2021-11-20 DIAGNOSIS — E669 Obesity, unspecified: Secondary | ICD-10-CM | POA: Diagnosis present

## 2021-11-20 DIAGNOSIS — K921 Melena: Secondary | ICD-10-CM | POA: Diagnosis not present

## 2021-11-20 DIAGNOSIS — K635 Polyp of colon: Secondary | ICD-10-CM | POA: Diagnosis not present

## 2021-11-20 DIAGNOSIS — D509 Iron deficiency anemia, unspecified: Secondary | ICD-10-CM

## 2021-11-20 DIAGNOSIS — K3189 Other diseases of stomach and duodenum: Secondary | ICD-10-CM | POA: Diagnosis not present

## 2021-11-20 DIAGNOSIS — Z86010 Personal history of colonic polyps: Secondary | ICD-10-CM

## 2021-11-20 DIAGNOSIS — R2243 Localized swelling, mass and lump, lower limb, bilateral: Secondary | ICD-10-CM | POA: Diagnosis not present

## 2021-11-20 DIAGNOSIS — E876 Hypokalemia: Secondary | ICD-10-CM | POA: Diagnosis not present

## 2021-11-20 DIAGNOSIS — Z888 Allergy status to other drugs, medicaments and biological substances status: Secondary | ICD-10-CM | POA: Diagnosis not present

## 2021-11-20 DIAGNOSIS — D6869 Other thrombophilia: Secondary | ICD-10-CM | POA: Diagnosis not present

## 2021-11-20 DIAGNOSIS — R1013 Epigastric pain: Secondary | ICD-10-CM | POA: Diagnosis not present

## 2021-11-20 DIAGNOSIS — I480 Paroxysmal atrial fibrillation: Secondary | ICD-10-CM | POA: Diagnosis not present

## 2021-11-20 DIAGNOSIS — K219 Gastro-esophageal reflux disease without esophagitis: Secondary | ICD-10-CM | POA: Diagnosis present

## 2021-11-20 DIAGNOSIS — D6832 Hemorrhagic disorder due to extrinsic circulating anticoagulants: Secondary | ICD-10-CM | POA: Diagnosis present

## 2021-11-20 DIAGNOSIS — R5383 Other fatigue: Secondary | ICD-10-CM | POA: Insufficient documentation

## 2021-11-20 DIAGNOSIS — I10 Essential (primary) hypertension: Secondary | ICD-10-CM | POA: Diagnosis present

## 2021-11-20 DIAGNOSIS — D122 Benign neoplasm of ascending colon: Secondary | ICD-10-CM | POA: Diagnosis not present

## 2021-11-20 DIAGNOSIS — T45515A Adverse effect of anticoagulants, initial encounter: Secondary | ICD-10-CM | POA: Diagnosis present

## 2021-11-20 DIAGNOSIS — Z803 Family history of malignant neoplasm of breast: Secondary | ICD-10-CM

## 2021-11-20 DIAGNOSIS — R600 Localized edema: Secondary | ICD-10-CM | POA: Diagnosis not present

## 2021-11-20 DIAGNOSIS — R1310 Dysphagia, unspecified: Secondary | ICD-10-CM

## 2021-11-20 DIAGNOSIS — K319 Disease of stomach and duodenum, unspecified: Secondary | ICD-10-CM | POA: Diagnosis not present

## 2021-11-20 DIAGNOSIS — Z7951 Long term (current) use of inhaled steroids: Secondary | ICD-10-CM

## 2021-11-20 DIAGNOSIS — I4891 Unspecified atrial fibrillation: Secondary | ICD-10-CM | POA: Diagnosis not present

## 2021-11-20 DIAGNOSIS — Z90710 Acquired absence of both cervix and uterus: Secondary | ICD-10-CM

## 2021-11-20 DIAGNOSIS — E785 Hyperlipidemia, unspecified: Secondary | ICD-10-CM | POA: Diagnosis present

## 2021-11-20 DIAGNOSIS — R0602 Shortness of breath: Secondary | ICD-10-CM | POA: Diagnosis not present

## 2021-11-20 DIAGNOSIS — K254 Chronic or unspecified gastric ulcer with hemorrhage: Secondary | ICD-10-CM | POA: Diagnosis not present

## 2021-11-20 DIAGNOSIS — K5520 Angiodysplasia of colon without hemorrhage: Secondary | ICD-10-CM | POA: Diagnosis present

## 2021-11-20 DIAGNOSIS — Z8249 Family history of ischemic heart disease and other diseases of the circulatory system: Secondary | ICD-10-CM

## 2021-11-20 LAB — CBC
HCT: 19.4 % — ABNORMAL LOW (ref 36.0–46.0)
Hemoglobin: 5.2 g/dL — CL (ref 12.0–15.0)
MCH: 18.4 pg — ABNORMAL LOW (ref 26.0–34.0)
MCHC: 26.8 g/dL — ABNORMAL LOW (ref 30.0–36.0)
MCV: 68.6 fL — ABNORMAL LOW (ref 80.0–100.0)
Platelets: 284 10*3/uL (ref 150–400)
RBC: 2.83 MIL/uL — ABNORMAL LOW (ref 3.87–5.11)
RDW: 19.6 % — ABNORMAL HIGH (ref 11.5–15.5)
WBC: 5.5 10*3/uL (ref 4.0–10.5)
nRBC: 0 % (ref 0.0–0.2)

## 2021-11-20 LAB — POC OCCULT BLOOD, ED: Fecal Occult Bld: POSITIVE — AB

## 2021-11-20 LAB — BASIC METABOLIC PANEL
Anion gap: 6 (ref 5–15)
Anion gap: 6 (ref 5–15)
BUN: 8 mg/dL (ref 8–23)
BUN: 8 mg/dL (ref 8–23)
CO2: 20 mmol/L — ABNORMAL LOW (ref 22–32)
CO2: 22 mmol/L (ref 22–32)
Calcium: 9 mg/dL (ref 8.9–10.3)
Calcium: 9.1 mg/dL (ref 8.9–10.3)
Chloride: 112 mmol/L — ABNORMAL HIGH (ref 98–111)
Chloride: 111 mmol/L (ref 98–111)
Creatinine, Ser: 0.85 mg/dL (ref 0.44–1.00)
Creatinine, Ser: 0.82 mg/dL (ref 0.44–1.00)
GFR, Estimated: >60 mL/min (ref >60)
GFR, Estimated: >60 mL/min (ref >60)
Glucose, Bld: 113 mg/dL — ABNORMAL HIGH (ref 70–99)
Glucose, Bld: 117 mg/dL — ABNORMAL HIGH (ref 70–99)
Potassium: 4.2 mmol/L (ref 3.5–5.1)
Potassium: 4.1 mmol/L (ref 3.5–5.1)
Sodium: 138 mmol/L (ref 135–145)
Sodium: 139 mmol/L (ref 135–145)

## 2021-11-20 LAB — CBC WITH DIFFERENTIAL/PLATELET
Abs Immature Granulocytes: 0.07 10*3/uL (ref 0.00–0.07)
Basophils Absolute: 0.1 10*3/uL (ref 0.0–0.1)
Basophils Relative: 1 %
Eosinophils Absolute: 0.1 10*3/uL (ref 0.0–0.5)
Eosinophils Relative: 2 %
HCT: 18.9 % — ABNORMAL LOW (ref 36.0–46.0)
Hemoglobin: 5 g/dL — CL (ref 12.0–15.0)
Immature Granulocytes: 1 %
Lymphocytes Relative: 22 %
Lymphs Abs: 1.1 10*3/uL (ref 0.7–4.0)
MCH: 18.1 pg — ABNORMAL LOW (ref 26.0–34.0)
MCHC: 26.5 g/dL — ABNORMAL LOW (ref 30.0–36.0)
MCV: 68.5 fL — ABNORMAL LOW (ref 80.0–100.0)
Monocytes Absolute: 0.6 10*3/uL (ref 0.1–1.0)
Monocytes Relative: 13 %
Neutro Abs: 3.2 10*3/uL (ref 1.7–7.7)
Neutrophils Relative %: 61 %
Platelets: 303 10*3/uL (ref 150–400)
RBC: 2.76 MIL/uL — ABNORMAL LOW (ref 3.87–5.11)
RDW: 19.2 % — ABNORMAL HIGH (ref 11.5–15.5)
WBC: 5.1 10*3/uL (ref 4.0–10.5)
nRBC: 0 % (ref 0.0–0.2)

## 2021-11-20 LAB — BRAIN NATRIURETIC PEPTIDE
B Natriuretic Peptide: 165.3 pg/mL — ABNORMAL HIGH (ref 0.0–100.0)
B Natriuretic Peptide: 173.4 pg/mL — ABNORMAL HIGH (ref 0.0–100.0)

## 2021-11-20 LAB — MAGNESIUM: Magnesium: 2 mg/dL (ref 1.7–2.4)

## 2021-11-20 LAB — PREPARE RBC (CROSSMATCH)

## 2021-11-20 LAB — PROTIME-INR
INR: 1.5 — ABNORMAL HIGH (ref 0.8–1.2)
Prothrombin Time: 17.9 seconds — ABNORMAL HIGH (ref 11.4–15.2)

## 2021-11-20 LAB — IRON AND TIBC
Iron: 14 ug/dL — ABNORMAL LOW (ref 28–170)
Saturation Ratios: 3 % — ABNORMAL LOW (ref 10.4–31.8)
TIBC: 430 ug/dL (ref 250–450)
UIBC: 416 ug/dL

## 2021-11-20 LAB — VITAMIN B12: Vitamin B-12: 326 pg/mL (ref 180–914)

## 2021-11-20 LAB — FOLATE: Folate: 9.7 ng/mL (ref >5.9)

## 2021-11-20 LAB — ABO/RH: ABO/RH(D): O POS

## 2021-11-20 LAB — FERRITIN: Ferritin: 4 ng/mL — ABNORMAL LOW (ref 11–307)

## 2021-11-20 MED ORDER — CLONAZEPAM 0.5 MG PO TABS
1.0000 mg | ORAL_TABLET | Freq: Three times a day (TID) | ORAL | Status: DC
  Administered 2021-11-20 – 2021-11-24 (×10): 1 mg via ORAL
  Filled 2021-11-20 (×10): qty 2

## 2021-11-20 MED ORDER — SODIUM CHLORIDE 0.9 % IV SOLN
10.0000 mL/h | Freq: Once | INTRAVENOUS | Status: AC
  Administered 2021-11-20: 10 mL/h via INTRAVENOUS

## 2021-11-20 MED ORDER — PANTOPRAZOLE SODIUM 40 MG PO TBEC
40.0000 mg | DELAYED_RELEASE_TABLET | Freq: Every day | ORAL | Status: DC
  Administered 2021-11-21: 40 mg via ORAL
  Filled 2021-11-20: qty 1

## 2021-11-20 MED ORDER — ALBUTEROL SULFATE (2.5 MG/3ML) 0.083% IN NEBU: 2.5000 mg | INHALATION_SOLUTION | Freq: Four times a day (QID) | RESPIRATORY_TRACT | Status: DC | PRN

## 2021-11-20 MED ORDER — ACETAMINOPHEN 325 MG PO TABS
650.0000 mg | ORAL_TABLET | Freq: Four times a day (QID) | ORAL | Status: DC | PRN
  Administered 2021-11-20: 650 mg via ORAL

## 2021-11-20 MED ORDER — SODIUM CHLORIDE 0.9 % IV BOLUS
1000.0000 mL | Freq: Once | INTRAVENOUS | Status: AC
  Administered 2021-11-20: 1000 mL via INTRAVENOUS

## 2021-11-20 MED ORDER — DILTIAZEM HCL ER COATED BEADS 120 MG PO CP24
120.0000 mg | ORAL_CAPSULE | Freq: Every day | ORAL | Status: DC
  Administered 2021-11-21 – 2021-11-24 (×4): 120 mg via ORAL
  Filled 2021-11-20 (×5): qty 1

## 2021-11-20 MED ORDER — SERTRALINE HCL 50 MG PO TABS
50.0000 mg | ORAL_TABLET | Freq: Every day | ORAL | Status: DC
  Administered 2021-11-20 – 2021-11-23 (×4): 50 mg via ORAL
  Filled 2021-11-20 (×4): qty 1

## 2021-11-20 MED ORDER — PRAVASTATIN SODIUM 40 MG PO TABS
40.0000 mg | ORAL_TABLET | Freq: Every day | ORAL | Status: DC
Start: 2021-11-20 — End: 2021-11-24
  Administered 2021-11-20 – 2021-11-23 (×4): 40 mg via ORAL
  Filled 2021-11-20 (×4): qty 1

## 2021-11-20 MED ORDER — BISACODYL 5 MG PO TBEC
10.0000 mg | DELAYED_RELEASE_TABLET | Freq: Four times a day (QID) | ORAL | Status: DC
  Filled 2021-11-20: qty 2

## 2021-11-20 MED ORDER — MOMETASONE FURO-FORMOTEROL FUM 200-5 MCG/ACT IN AERO
2.0000 | INHALATION_SPRAY | Freq: Two times a day (BID) | RESPIRATORY_TRACT | Status: DC
  Administered 2021-11-20 – 2021-11-24 (×8): 2 via RESPIRATORY_TRACT
  Filled 2021-11-20 (×2): qty 8.8

## 2021-11-20 MED ORDER — APIXABAN 5 MG PO TABS: 5.0000 mg | ORAL_TABLET | Freq: Two times a day (BID) | ORAL | Status: DC

## 2021-11-20 MED ORDER — DOFETILIDE 500 MCG PO CAPS
500.0000 ug | ORAL_CAPSULE | Freq: Two times a day (BID) | ORAL | Status: DC
  Administered 2021-11-20 – 2021-11-23 (×6): 500 ug via ORAL
  Filled 2021-11-20 (×7): qty 1

## 2021-11-20 NOTE — ED Notes (Signed)
Critical hemoglobin reported to Dr. Langston Masker

## 2021-11-20 NOTE — ED Provider Notes (Signed)
[place] EMERGENCY DEPARTMENT Provider Note   CSN: [id number] Arrival date & time: 11/20/21  1248     History  Chief Complaint  Patient presents with   Anemia    [name] is a 71 y.o. female with a history of A-fib on Eliquis presenting to ED with concern for generalized weakness, shortness of breath.  She reports she was told her hemoglobin was low and outpatient testing and told to come into the ED for further treatment.  She does report a history of GI bleed in the past, approximately 4 years ago, which time she was switched from Xarelto to Eliquis.  She did require blood transfusion at that time.  She was worked up at [facility], and has seen GI over there.  She reportedly underwent capsule endoscopy at that time.    HPI     Home Medications Prior to Admission medications   Medication Sig Start Date End Date Taking? Authorizing Provider  acetaminophen (TYLENOL) 500 MG tablet Take 500 mg by mouth every 6 (six) hours as needed for mild pain or headache.   Yes [provider]  butalbital-acetaminophen-caffeine (FIORICET) 50-325-40 MG tablet Take 1 tablet by mouth every 8 (eight) hours as needed for headache or migraine.    Yes [provider]  butorphanol (STADOL) 10 MG/ML nasal spray Place 1 spray into the nose every 4 (four) hours as needed for headache or migraine.    Yes [provider]  cetirizine (ZYRTEC) 10 MG tablet Take 10 mg by mouth daily as needed for allergies or rhinitis.   Yes [provider]  clonazePAM (KLONOPIN) 1 MG tablet Take 1 mg by mouth 3 (three) times daily.   Yes [provider]  diltiazem (CARDIZEM CD) 120 MG 24 hr capsule Take 1 capsule (120 mg total) by mouth daily. 11/06/21  Yes Fenton, Clint R, PA  diltiazem (CARDIZEM) 30 MG tablet Take 1 Tablet Every 4 Hours As Needed For HR >100 11/15/21  Yes Fenton, Clint R, PA  dofetilide (TIKOSYN) 500 MCG capsule Take 1 capsule  (500 mcg total) by mouth 2 (two) times daily. 11/15/21  Yes Camnitz, Will Hassell Done, MD  ELIQUIS 5 MG TABS tablet TAKE 1 TABLET BY MOUTH TWICE A DAY Patient taking differently: Take 5 mg by mouth 2 (two) times daily. 07/23/21  Yes Camnitz, Will Hassell Done, MD  fluticasone-salmeterol (ADVAIR HFA) 193-79 MCG/ACT inhaler Inhale 2 puffs into the lungs 2 (two) times daily.   Yes [provider]  furosemide (LASIX) 20 MG tablet TAKE 1 TABLET BY MOUTH EVERY DAY AS NEEDED FOR EDEMA Patient taking differently: Take 20 mg by mouth daily as needed for fluid. 07/23/21  Yes Camnitz, Will Hassell Done, MD  KLOR-CON M20 20 MEQ tablet TAKE 1 TABLET BY MOUTH EVERY DAY Patient taking differently: Take 20 mEq by mouth daily. 02/26/21  Yes Allred, Jeneen Rinks, MD  pravastatin (PRAVACHOL) 40 MG tablet Take 40 mg by mouth at bedtime.  11/30/18  Yes [provider]  promethazine (PHENERGAN) 25 MG tablet Take 25 mg by mouth every 6 (six) hours as needed for nausea or vomiting (from migraines).    Yes [provider]  sertraline (ZOLOFT) 50 MG tablet Take 50 mg by mouth at bedtime. 06/11/21  Yes [provider]  VENTOLIN HFA 108 (90 Base) MCG/ACT inhaler Inhale 2 puffs into the lungs every 6 (six) hours as needed for wheezing or shortness of breath.   Yes [provider]  Allergies    Pneumococcal vaccines, Shingrix [zoster vac recomb adjuvanted], Other, Meperidine, and Prednisone    Review of Systems   Review of Systems  Physical Exam Updated Vital Signs BP (!) 150/65   Pulse 82   Temp 98.3 F (36.8 C) (Oral)   Resp (!) 22   SpO2 100%  Physical Exam Constitutional:      General: She is not in acute distress. HENT:     Head: Normocephalic and atraumatic.  Eyes:     Conjunctiva/sclera: Conjunctivae normal.     Pupils: Pupils are equal, round, and reactive to light.  Cardiovascular:     Rate and Rhythm: Normal rate and regular rhythm.  Pulmonary:     Effort: Pulmonary effort is  normal. No respiratory distress.  Abdominal:     General: There is no distension.     Tenderness: There is no abdominal tenderness.  Genitourinary:    Comments: Stool is melanotic on GU exam, performed with chaperone present Skin:    General: Skin is warm and dry.  Neurological:     General: No focal deficit present.     Mental Status: She is alert. Mental status is at baseline.  Psychiatric:        Mood and Affect: Mood normal.        Behavior: Behavior normal.     ED Results / Procedures / Treatments   Labs (all labs ordered are listed, but only abnormal results are displayed) Labs Reviewed  CBC WITH DIFFERENTIAL/PLATELET - Abnormal; Notable for the following components:      Result Value   RBC 2.76 (*)    Hemoglobin 5.0 (*)    HCT 18.9 (*)    MCV 68.5 (*)    MCH 18.1 (*)    MCHC 26.5 (*)    RDW 19.2 (*)    All other components within normal limits  BASIC METABOLIC PANEL - Abnormal; Notable for the following components:   Glucose, Bld 117 (*)    All other components within normal limits  PROTIME-INR - Abnormal; Notable for the following components:   Prothrombin Time 17.9 (*)    INR 1.5 (*)    All other components within normal limits  BRAIN NATRIURETIC PEPTIDE - Abnormal; Notable for the following components:   B Natriuretic Peptide 173.4 (*)    All other components within normal limits  POC OCCULT BLOOD, ED - Abnormal; Notable for the following components:   Fecal Occult Bld POSITIVE (*)    All other components within normal limits  TYPE AND SCREEN  ABO/RH  PREPARE RBC (CROSSMATCH)    EKG EKG Interpretation  Date/Time:  Tuesday November 20 2021 13:24:04 EDT Ventricular Rate:  80 PR Interval:  176 QRS Duration: 82 QT Interval:  422 QTC Calculation: 486 R Axis:   70 Text Interpretation: Sinus rhythm with Premature supraventricular complexes When compared with ECG of 20-Nov-2021 09:55, PREVIOUS ECG IS PRESENT Confirmed by Octaviano Glow [phone]) on 11/20/2021  1:50:33 PM  Radiology DG Chest 2 View  Result Date: 11/20/2021 CLINICAL DATA:  71 year old female presenting for evaluation of shortness of breath. EXAM: CHEST - 2 VIEW COMPARISON:  May 09, 2021. FINDINGS: Cardiac loop recorder projects over the LEFT chest. Cardiomediastinal contours and hilar structures are stable. Lungs are clear. No sign of pleural effusion. No visible pneumothorax. On limited assessment no acute skeletal findings. IMPRESSION: No active cardiopulmonary disease. Electronically Signed   By: Zetta Bills M.D.   On: 11/20/2021 13:53    Procedures .Critical  Care  Performed by: Wyvonnia Dusky, MD Authorized by: Wyvonnia Dusky, MD   Critical care provider statement:    Critical care time (minutes):  45   Critical care time was exclusive of:  Separately billable procedures and treating other patients   Critical care was necessary to treat or prevent imminent or life-threatening deterioration of the following conditions:  Circulatory failure   Critical care was time spent personally by me on the following activities:  Ordering and performing treatments and interventions, ordering and review of laboratory studies, ordering and review of radiographic studies, pulse oximetry, review of old charts, examination of patient and evaluation of patient's response to treatment   Care discussed with: admitting provider   Comments:     Symptomatic anemia requiring blood transfusion resuscitation.     Medications Ordered in ED Medications  0.9 %  sodium chloride infusion (has no administration in time range)    ED Course/ Medical Decision Making/ A&P Clinical Course as of 11/20/21 1516  Tue Nov 20, 2021  1452 Gi consult placed with Velora Heckler [MT]    Clinical Course User Index [MT] Wyvonnia Dusky, MD                           Medical Decision Making Amount and/or Complexity of Data Reviewed Labs: ordered.  Risk Prescription drug management. Decision regarding  hospitalization.   This patient presents to the ED with concern for generalized weakness, shortness of breath. This involves an extensive number of treatment options, and is a complaint that carries with it a high risk of complications and morbidity.  The differential diagnosis includes symptomatic anemia versus arrhythmia versus atypical ACS versus pneumonia versus other  Based on outpatient records which I personally reviewed, the patient is acutely anemic.  Repeat hemoglobin today shows persistent anemia with hemoglobin 5.0.  Fortunately she is stable from a hemodynamic perspective, blood pressure is normal, she is mentating well.  I doubt GI hemorrhage at this time, but I do believe that she would need blood transfusions and medical admission and GI consultation.  The patient and her husband are in agreement.  Co-morbidities that complicate the patient evaluation: History of blood thinner use and GI bleeding in the past raise concern for possible recurrent GI bleed  Additional history obtained from patient's husband at the bedside  I ordered and personally interpreted labs.  The pertinent results include: BUN within normal limits, BMP unremarkable.  Hemoglobin 5.0.  Hemoccult positive.  I ordered imaging studies including x-ray of the chest I independently visualized and interpreted imaging which showed no acute abnormality I agree with the radiologist interpretation  The patient was maintained on a cardiac monitor.  I personally viewed and interpreted the cardiac monitored which showed an underlying rhythm of: Normal heart rate  Per my interpretation the patient's ECG shows no acute ischemic  I ordered medication including 2 units packed red blood cells for symptomatic anemia.  Suspect this is likely lower GI bleed - I do not see an indication at this time for Rocephin, Pepcid or Protonix.  GI team was consulted and will see the patient as well.  After the interventions noted above, I  reevaluated the patient and found that they have: stayed the same   Dispostion:  After consideration of the diagnostic results and the patients response to treatment, I feel that the patent would benefit from medical admission.         Final Clinical  Impression(s) / ED Diagnoses Final diagnoses:  Symptomatic anemia  Melena    Rx / DC Orders ED Discharge Orders     None         Wyvonnia Dusky, MD 11/20/21 [phone]

## 2021-11-20 NOTE — Progress Notes (Addendum)
Primary Care Physician: Harvie Junior, MD Referring Physician: Dr. Rayann Heman Primary EP: Dr Emogene Morgan is a 71 y.o. female with a h/o anemia,  afib, and syncope possibly 2/2 termination pause  that presented to his office with afib with RVR at 160 bpm and was admitted for Tikosyn load. Converted to SR without pause. Qtc remained stable. She is on Eliquis for a CHADS2VASC score of 3. She is s/p repeat afib ablation with Dr Rayann Heman on 03/14/20. Patient had tachypalpitations on 02/27/21 and took one of her PRN diltiazem which did not resolve her symptoms. EMS was called and ECG conformed afib with RVR. She was given IV diltiazem which converted her to SR. Patient does report that he niece has passed away the day before the onset of her symptoms. She has had 3 episodes of afib since the start of 2023 and went to the ED 05/09/21. She spontaneously converted en route. She was seen by Dr Curt Bears 06/18/21 and her BB was increased.   On follow up today, patient reports that she has been feeling "terrible". She becomes very SOB with minimal exertion. She has also had intermittent "stabbing" chest discomfort with exertion that can last up to 30 minutes. The pain does not radiate, can be up to 7/10 in severity. Her ILR shows no episodes of afib since February.   Today, she denies symptoms of orthopnea, PND, dizziness, presyncope, syncope, or neurologic sequela.  otherwise without complaint today.   Past Medical History:  Diagnosis Date   Anticoagulant long-term use 11/16/2014   Anxiety 10/06/2017   Arthritis 10/06/2017   knees bilateral   Asthma 11/16/2014   Esophageal reflux 11/16/2014   Essential hypertension 08/15/2015   pt denies   H/O: GI bleed 12/11/2016   Hyperlipidemia 11/17/2014   Migraine headache 11/16/2014   Nausea 11/16/2014   Pain in limb 11/16/2014   Panic disorder 11/16/2014   Paroxysmal atrial fibrillation ([place]) 11/17/2014   Swelling of joint 11/16/2014   Symptomatic anemia  11/24/2016   Tendonitis 11/16/2014   UTI (urinary tract infection) 12/19/2015   Past Surgical History:  Procedure Laterality Date   ABDOMINAL HYSTERECTOMY     ATRIAL FIBRILLATION ABLATION N/A 03/14/2020   Procedure: ATRIAL FIBRILLATION ABLATION;  Surgeon: [name], MD;  Location: [facility];  Service: Cardiovascular;  Laterality: N/A;   CARDIOVERSION     CESAREAN SECTION     COLONOSCOPY     LOOP RECORDER INSERTION N/A 02/04/2018   Procedure: LOOP RECORDER INSERTION;  Surgeon: [name], MD;  Location: [facility];  Service: Cardiovascular;  Laterality: N/A;   ROTATOR CUFF REPAIR Left     Current Outpatient Medications  Medication Sig Dispense Refill   acetaminophen (TYLENOL) 500 MG tablet Take 500 mg by mouth every 6 (six) hours as needed for mild pain or headache.     butalbital-acetaminophen-caffeine (FIORICET) 50-325-40 MG tablet Take 1 tablet by mouth every 8 (eight) hours as needed for headache or migraine.      butorphanol (STADOL) 10 MG/ML nasal spray Place 1 spray into the nose every 4 (four) hours as needed for headache or migraine.      cetirizine (ZYRTEC) 10 MG tablet Take 10 mg by mouth daily as needed for allergies or rhinitis.     clonazePAM (KLONOPIN) 1 MG tablet Take 1 mg by mouth 3 (three) times daily.     diltiazem (CARDIZEM CD) 120 MG 24 hr capsule Take 1 capsule (120 mg total) by mouth  daily. 30 capsule 3   diltiazem (CARDIZEM) 30 MG tablet Take 1 Tablet Every 4 Hours As Needed For HR >100 45 tablet 1   dofetilide (TIKOSYN) 500 MCG capsule Take 1 capsule (500 mcg total) by mouth 2 (two) times daily. 180 capsule 2   ELIQUIS 5 MG TABS tablet TAKE 1 TABLET BY MOUTH TWICE A DAY 60 tablet 5   fluticasone-salmeterol (ADVAIR HFA) 115-21 MCG/ACT inhaler Inhale 2 puffs into the lungs 2 (two) times daily.     furosemide (LASIX) 20 MG tablet TAKE 1 TABLET BY MOUTH EVERY DAY AS NEEDED FOR EDEMA 90 tablet 3   KLOR-CON M20 20 MEQ tablet TAKE 1 TABLET BY MOUTH  EVERY DAY 90 tablet 3   pravastatin (PRAVACHOL) 40 MG tablet Take 40 mg by mouth at bedtime.      promethazine (PHENERGAN) 25 MG tablet Take 25 mg by mouth every 6 (six) hours as needed for nausea or vomiting (from migraines).      sertraline (ZOLOFT) 50 MG tablet Take by mouth at bedtime.     VENTOLIN HFA 108 (90 Base) MCG/ACT inhaler Inhale 2 puffs into the lungs every 6 (six) hours as needed for wheezing or shortness of breath.     No current facility-administered medications for this encounter.    Allergies  Allergen Reactions   Pneumococcal Vaccines Other (See Comments)    Went into A-Fib immediately after receiving this    Shingrix [Zoster Vac Recomb Adjuvanted] Other (See Comments)    Went into A-Fib immediately after receiving this    Other Other (See Comments)    Anesthesia- Takes a lot to anesthetize the patient   Meperidine Nausea And Vomiting and Other (See Comments)    States she "About passed out," also   Prednisone Palpitations and Other (See Comments)    Tachycardia     Social History   Socioeconomic History   Marital status: Married    Spouse name: Not on file   Number of children: Not on file   Years of education: Not on file   Highest education level: Not on file  Occupational History   Not on file  Tobacco Use   Smoking status: Never   Smokeless tobacco: Never   Tobacco comments:    Never smoke 07/26/21  Vaping Use   Vaping Use: Never used  Substance and Sexual Activity   Alcohol use: Never   Drug use: Never   Sexual activity: Not on file  Other Topics Concern   Not on file  Social History Narrative   Lives in [place] with spouse   Retired from school system.   Social Determinants of Health   Financial Resource Strain: Not on file  Food Insecurity: Not on file  Transportation Needs: Not on file  Physical Activity: Not on file  Stress: Not on file  Social Connections: Not on file  Intimate Partner Violence: Not on file    Family History   Problem Relation Age of Onset   Heart attack Mother    Heart disease Mother    Heart disease Sister    Heart disease Sister    Heart disease Sister    Heart disease Sister    Heart disease Sister     ROS- All systems are reviewed and negative except as per the HPI above  Physical Exam: Vitals:   11/20/21 0925  Height: '5\' 8"'$  (1.727 m)     Wt Readings from Last 3 Encounters:  09/20/21 100.4 kg  07/26/21 97.8  kg  06/18/21 97.1 kg    Labs: Lab Results  Component Value Date   NA 138 09/20/2021   K 4.4 09/20/2021   CL 105 09/20/2021   CO2 19 (L) 09/20/2021   GLUCOSE 98 09/20/2021   BUN 12 09/20/2021   CREATININE 0.90 09/20/2021   CALCIUM 9.6 09/20/2021   MG 2.2 09/20/2021   No results found for: "INR" No results found for: "CHOL", "HDL", "LDLCALC", "TRIG"   GEN- The patient is a pale appearing obese female, alert and oriented x 3 today.   HEENT-head normocephalic, atraumatic, sclera clear, conjunctiva pink, hearing intact, trachea midline. Lungs- Clear to ausculation bilaterally, normal work of breathing Heart- Regular rate and rhythm, no murmurs, rubs or gallops  GI- soft, NT, ND, + BS Extremities- no clubbing, cyanosis, or edema MS- no significant deformity or atrophy Skin- no rash or lesion Psych- euthymic mood, full affect Neuro- strength and sensation are intact   EKG-  SR, PAC Vent. rate 74 BPM PR interval 184 ms QRS duration 84 ms QT/QTcB 418/463 ms  Echo 02/24/20 demonstrated  1. Left ventricular ejection fraction, by estimation, is 60 to 65%. The  left ventricle has normal function. The left ventricle has no regional  wall motion abnormalities. Left ventricular diastolic parameters are  consistent with Grade II diastolic dysfunction (pseudonormalization).   2. Right ventricular systolic function is normal. The right ventricular  size is normal. There is normal pulmonary artery systolic pressure. The estimated right ventricular systolic pressure  is 99.8 mmHg.   3. Left atrial size was mildly dilated.   4. The mitral valve is normal in structure. Mild to moderate mitral valve regurgitation. No evidence of mitral stenosis.   5. The aortic valve is tricuspid. Aortic valve regurgitation is trivial.  No aortic stenosis is present.   6. Aortic dilatation noted. There is mild dilatation of the aortic root,  measuring 38 mm.   7. The inferior vena cava is normal in size with greater than 50%  respiratory variability, suggesting right atrial pressure of 3 mmHg.   Epic records reviewed   Assessment and Plan: 1. Paroxysmal atrial fibrillation   S/p repeat ablation with Dr Rayann Heman 03/14/20 ILR shows no afib since February.  Continue dofetilide 500 mcg BID. QT stable. Continue diltiazem 120 mg daily with 30 mg PRN q 4hrs for heart racing. Continue Eliquis 5 mg BID  Check echocardiogram  We discussed that if she has more frequent afib we may need to consider alternate AAD (amiodarone). Unsure if repeat ablation would be effective given "multiple atypical atrial flutter circuits" noted on her previous report.   This patients CHA2DS2-VASc Score and unadjusted Ischemic Stroke Rate (% per year) is equal to 3.2 % stroke rate/year from a score of 3  Above score calculated as 1 point each if present [CHF, HTN, DM, Vascular=MI/PAD/Aortic Plaque, Age if 65-74, or Female] Above score calculated as 2 points each if present [Age > 75, or Stroke/TIA/TE]  2. HTN Stable, no changes today.  3. Fatigue/DOE/CP Patient having significant DOE and occasionally chest pain with both typical and atypical features. CT scan two years ago showed CAC score 519 with no significant stenosis.  Check CBC/Bmet/BNP Echocardiogram as above.  Will check Lexiscan myoview to evaluate for ischemia. Does not appear related to afib.   Follow up in the AF clinic in 2 weeks.    Addendum: Hgb 5.2 today. Suspect this is responsible for her symptoms. Will cancel stress test.  Called patient with result and  recommend that she proceed to the ED for transfusion/workup. Patient agreeable to plan.    [facility] [address] [place], [place] [address] [phone]

## 2021-11-20 NOTE — ED Triage Notes (Signed)
Patient here for anemia, reported Hgb 5. Patient complains of fatigue and generalized weakness. Patient denies pain, denies known source of bleeding. Patient is alert, oriented, and in no apparent distress at this time. Patient on eliquis.

## 2021-11-20 NOTE — Addendum Note (Signed)
Encounter addended by: Oliver Barre, PA on: 11/20/2021 12:04 PM  Actions taken: Clinical Note Signed

## 2021-11-20 NOTE — ED Notes (Signed)
Informed consent for blood administration obtained and at bedside

## 2021-11-20 NOTE — ED Provider Triage Note (Signed)
Emergency Medicine Provider Triage Evaluation Note  Nicole [name] , a 71 y.o. female  was evaluated in triage.  Pt complains of abnormal labs.  Was seen by the A-fib clinic for shortness of breath.  Noted to have new hemoglobin of 5.  States she has needed blood transfusion previously however has been many years.  Patient denies any bleeding.  No melena blood per rectum.  She is chronically anticoagulated on Eliquis.  No chest pain.  Does have shortness of breath, no cough.  Feels very fatigued.  Review of Systems  Positive: Abnormal lab, fatigue, shortness of breath Negative: CP  Physical Exam  BP 122/70 (BP Location: Right Arm)   Pulse 72   Temp 98.3 F (36.8 C) (Oral)   Resp 18   SpO2 100%  Gen:   Awake, no distress   Resp:  Normal effort  MSK:   Moves extremities without difficulty  Other:  Conjunctival, skin pallor  Medical Decision Making  Medically screening exam initiated at 1:20 PM.  Appropriate orders placed.  Nicole [name] was informed that the remainder of the evaluation will be completed by another provider, this initial triage assessment does not replace that evaluation, and the importance of remaining in the ED until their evaluation is complete.  Shortness of breath, abnormal lab  Reviewed records hemoglobin 5.2   Nicole [name] A, PA-C 11/20/21 1321

## 2021-11-20 NOTE — H&P (Signed)
Date: 11/20/2021               Patient Name:  Nicole [name] MRN: [id number]  DOB: 1950-09-29 Age / Sex: 71 y.o., female   PCP: Harvie Junior, MD         Medical Service: Internal Medicine Teaching Service         Attending Physician: Dr. Lottie Mussel, MD    First Contact: Dr. Alton Revere Pager: [phone]  Second Contact: Dr. Collene Gobble Pager: [phone]       After Hours (After 5p/  First Contact Pager: [phone]  weekends / holidays): Second Contact Pager: [phone]   Chief Complaint: Generalized weakness  History of Present Illness:  Nicole [name] is a 71 y/o female with history of GI bleed (2018), chronic anemia, A-fib/RVR on chronic Eliquis, s/p internal loop recorder (2019), and migraines that presents with 3 months of generalized weakness.  Patient had outpatient lab work done today that demonstrated low hemoglobin and she was told to come into the ED.   During this period of time the patient has also experienced fatigue, shortness of breath, lightheadedness, dizziness, and lower extremity swelling.  Patient is also complaining of 3 months of intermittent centralized chest pain.  Patient describes this pain as sharp, worse with activity, and improves with relaxation.  She states that her pain is similar to previous episodes of chest pain that she notes at the onset of her episodes of A-fib.  She last experienced chest pain 2 days ago and is not currently having chest pain.  Patient also endorses headache but states that this is consistent with her normal migraines.  She states that she experiences nausea and vomiting with her migraines but not outside of these episodes. She states that she has been having regular bowel movements with the last one occurring this morning.  She states that her stool is soft and appears normal to her.  Patient denies recent trauma or falls. Patient denies having nosebleeds, hemoptysis, melena, fever, chills, abdominal pain, syncope, dysuria, changes to the  color of her urine. Patient denies a history of hypertension, diabetes, cancer.  Patient is a full code.   The patient experienced GI bleed approximately 4 years ago where she required blood transfusion at [facility]. She had a colonoscopy at that time where 3 small colon polyps were removed and small internal hemorrhoids were noted.   Hemoccult positive with hemoglobin of 5 in the ED. BNP 165.3 in ED. Normal BP and normal mentation while in the ED. Received 2 units of pRBCs in ED. GI was consulted and will see the patient. GI initiated Bisacodyl ('10mg'$ , q6 hrs).   PCP: Dr. York Ram Electrophysiologist: Dr. Ocie Doyne Camnitz  Meds:  Current Meds  Medication Sig   acetaminophen (TYLENOL) 500 MG tablet Take 500 mg by mouth every 6 (six) hours as needed for mild pain or headache.   butalbital-acetaminophen-caffeine (FIORICET) 50-325-40 MG tablet Take 1 tablet by mouth every 8 (eight) hours as needed for headache or migraine.    butorphanol (STADOL) 10 MG/ML nasal spray Place 1 spray into the nose every 4 (four) hours as needed for headache or migraine.    cetirizine (ZYRTEC) 10 MG tablet Take 10 mg by mouth daily as needed for allergies or rhinitis.   clonazePAM (KLONOPIN) 1 MG tablet Take 1 mg by mouth 3 (three) times daily.   diltiazem (CARDIZEM CD) 120 MG 24 hr capsule Take 1 capsule (120 mg total) by mouth  daily.   diltiazem (CARDIZEM) 30 MG tablet Take 1 Tablet Every 4 Hours As Needed For HR >100   dofetilide (TIKOSYN) 500 MCG capsule Take 1 capsule (500 mcg total) by mouth 2 (two) times daily.   ELIQUIS 5 MG TABS tablet TAKE 1 TABLET BY MOUTH TWICE A DAY (Patient taking differently: Take 5 mg by mouth 2 (two) times daily.)   fluticasone-salmeterol (ADVAIR HFA) 115-21 MCG/ACT inhaler Inhale 2 puffs into the lungs 2 (two) times daily.   furosemide (LASIX) 20 MG tablet TAKE 1 TABLET BY MOUTH EVERY DAY AS NEEDED FOR EDEMA (Patient taking differently: Take 20 mg by  mouth daily as needed for fluid.)   KLOR-CON M20 20 MEQ tablet TAKE 1 TABLET BY MOUTH EVERY DAY (Patient taking differently: Take 20 mEq by mouth daily.)   pravastatin (PRAVACHOL) 40 MG tablet Take 40 mg by mouth at bedtime.    promethazine (PHENERGAN) 25 MG tablet Take 25 mg by mouth every 6 (six) hours as needed for nausea or vomiting (from migraines).    sertraline (ZOLOFT) 50 MG tablet Take 50 mg by mouth at bedtime.   VENTOLIN HFA 108 (90 Base) MCG/ACT inhaler Inhale 2 puffs into the lungs every 6 (six) hours as needed for wheezing or shortness of breath.     Allergies: Allergies as of 11/20/2021 - Review Complete 11/20/2021  Allergen Reaction Noted   Pneumococcal vaccines Other (See Comments) 02/29/2020   Shingrix [zoster vac recomb adjuvanted] Other (See Comments) 02/29/2020   Other Other (See Comments) 02/29/2020   Meperidine Nausea And Vomiting and Other (See Comments) 12/21/2014   Prednisone Palpitations and Other (See Comments) 09/26/2016   Past Medical History:  Diagnosis Date   Anticoagulant long-term use 11/16/2014   Anxiety 10/06/2017   Arthritis 10/06/2017   knees bilateral   Asthma 11/16/2014   Esophageal reflux 11/16/2014   Essential hypertension 08/15/2015   pt denies   H/O: GI bleed 12/11/2016   Hyperlipidemia 11/17/2014   Migraine headache 11/16/2014   Nausea 11/16/2014   Pain in limb 11/16/2014   Panic disorder 11/16/2014   Paroxysmal atrial fibrillation ([place]) 11/17/2014   Swelling of joint 11/16/2014   Symptomatic anemia 11/24/2016   Tendonitis 11/16/2014   UTI (urinary tract infection) 12/19/2015    Family History: History of cancer and 3 brothers and 3 sisters (unsure of which type of cancer). Maternal history of cardiac arrest.   Social History: Lives at home with her husband.  Denies ever using alcohol.  Denies ever using tobacco.  Denies ever using recreational drugs.  Surgical History: Left rotator cuff repair, 2X C-sections, hysterectomy, atrial fibrillation  ablation, cardioversion, loop recorder insertion.   Review of Systems: A complete ROS was negative except as per HPI.   Physical Exam: Blood pressure (!) 153/58, pulse 87, temperature 98.3 F (36.8 C), resp. rate 18, SpO2 100 %.  Constitutional:      General: She is not in acute distress. HENT:     Head: Normocephalic and atraumatic.  Eyes:     Conjunctiva/sclera: Conjunctivae normal.  Cardiovascular:     Rate and Rhythm: Normal rate and regular rhythm.    General: no murmurs, rubs, or gallops.  Pulmonary:     Effort: Pulmonary effort is normal.       General: No respiratory distress. No wheezes, rales, rhonchi.  Abdominal:     General: There is no distension. Normal bowel sounds.     Tenderness: There is no abdominal tenderness.  Genitourinary:    Comments: No  internal or external hemorrhoids present. No visible bleeding. Skin:     General: Skin is warm and dry. Normal skin turgor.  Musculoskeletal:     General: 3+ edema of bilateral LE.  Neurological:     Mental Status: She is alert. Mental status is at baseline.    EKG: personally reviewed my interpretation is relatively normal. Few PVCs.   CXR: personally reviewed my interpretation is no acute cardiopulmonary disease.   Assessment & Plan by Problem: Principal Problem:   Symptomatic anemia Symptomatic anemia with suspected GI bleed Patient presented with 3 months of generalized weakness, fatigue, shortness of breath, lightheadedness, dizziness.  Patient has been on long-term Eliquis which is likely contributing to her presentation. Hemoccult positive with hemoglobin of 5 in the ED. Received 2 units of pRBCs in ED. Hypertensive in the ED but otherwise hemodynamically stable. Spo2 100% on RA. Concern for potential lower GI bleed given that BUN is WNL. GI was consulted and will see the patient. EGD scheduled for 11/22/21.  - Will order CBC and CMP - Will order ferritin, folate, iron, TIBC, vitamin B12 - Eliquis  held  Atrial fibrillation Patient not currently in atrial fibrillation. No tachycardia. Eliquis held due to concern for potential GI bleed. Will continue home medications: - Diltiazem (120 mg daily) - Dofetilide (500 mcg, 2X daily)  Asthma Patient is complaining of chronic shortness of breath. I suspect that her shortness of breath is secondary to her anemia as opposed to her history of asthma. Will continue home medications:  - Dulera (2 puffs, 2X daily) - Albuterol (2.5 mg, inhalation, q6 PRN)  Esophageal Reflux Potentially related to the patients chest pain. Will continue home medications: - Protonix (40 mg, 1X daily )  5. Hyperlipidemia - Will continue home medications:  - Pravastatin ('40mg'$ , 1X daily)  6. Anxiety Patient does not appear to be anxious currently. Will continue home medications: - Sertraline ('50mg'$ , 1X daily) - Clonazepam ('1mg'$ , 3X daily)   Dispo: Admit patient to Inpatient with expected length of stay greater than 2 midnights.  SignedStarlyn Skeans, MD 11/20/2021, 5:39 PM  Pager: [phone] After 5pm on weekdays and 1pm on weekends: On Call pager: [phone]

## 2021-11-20 NOTE — Consult Note (Addendum)
[facility] Consult: 2:54 PM 11/20/2021  LOS: 0 days    Referring Provider: Dr Langston Masker in ED.    Primary Care Physician:  Harvie Junior, MD Primary Gastroenterologist:  Dr Charolotte Capuchin in [place]: [facility]  Leron Croak, MD. Last seen 02/2019    Reason for Consultation: GI bleed with black stools FOBT positive.  Hgb 5.   HPI: Nicole [name] is a 71 y.o. female.  PMH chronic anemia.  A-fib.  A-fib/RVR.  Chronic Eliquis. S/p internal loop recorder 2019.  Headaches.  Obesity.  BMI 33.  No H2 blocker or PPI at home.   GI bleed in 2018 with acute on chronic anemia.  Previous treatment for iron deficiency anemia. 11/2016 colonoscopy.  Dr. Bing [facility].  3 small colon polyps.  Small, nonbleeding internal hemorrhoids.  Pathology confirmed tubular adenomas and benign lymphoid polyp. 11/2016 EGD.  Normal esophagus, stomach, duodenum, examined jejunum.  Pathology from duodenum showed benign small bowel mucosa, no celiac disease. Because of the bleeding and anemia the Xarelto was switched to Eliquis.  Experiencing dyspnea with minor exertion, tachycardia, intermittent stabbing chest discomfort.  Progressive over at least 6 months.  Cardiology down titrated her metoprolol and then stopped it altogether and added a new medication but sxs continued.  Seen today at cardiology office.  Review of internal recorder: no A-fib since February.  Checked labs: Hgb was low.  Sent to ED. on DRE the ED physician noted a trace amount of stool which looked black/dark and was very heme positive.  Patient herself says she is had yellow-brown stools she has never seen blood or black/tarry stool.  She gets a lot of heartburn.  She used to take Zantac but has not been on H2 blocker or PPI  for some years.  She describes dysphagia especially to her potassium pills.  She also experiences sense of choking in the upper esophagus with liquids and solids.  Her appetite is poor.  No vomiting or nausea.  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  Weight is actually up in the last few months.  No use of ASA, NSAIDs.  Hgb 5.  MCV 68.  These were 9.8 and 86 in January.  Platelets, WBCs normal. INR 1.5. BUN/creatinine, electrolytes normal.  Family history of various cancers in siblings but she does not know the specifics other than a brother who had cancer in his spine.  She just buried one of her sisters last month with some sort of cancer of unclear etiology.  Another sister had breast cancer.  She is not aware of any colorectal, gastric, esophageal, pancreatic cancers.   Social history.  No alcohol.  No cigarettes.  Lives in [place]   Past Medical History:  Diagnosis Date   Anticoagulant long-term use 11/16/2014   Anxiety 10/06/2017   Arthritis 10/06/2017   knees bilateral   Asthma 11/16/2014   Esophageal reflux 11/16/2014   Essential hypertension 08/15/2015   pt denies   H/O: GI  bleed 12/11/2016   Hyperlipidemia 11/17/2014   Migraine headache 11/16/2014   Nausea 11/16/2014   Pain in limb 11/16/2014   Panic disorder 11/16/2014   Paroxysmal atrial fibrillation ([place]) 11/17/2014   Swelling of joint 11/16/2014   Symptomatic anemia 11/24/2016   Tendonitis 11/16/2014   UTI (urinary tract infection) 12/19/2015    Past Surgical History:  Procedure Laterality Date   ABDOMINAL HYSTERECTOMY     ATRIAL FIBRILLATION ABLATION N/A 03/14/2020   Procedure: ATRIAL FIBRILLATION ABLATION;  Surgeon: [name], MD;  Location: [facility];  Service: Cardiovascular;  Laterality: N/A;   CARDIOVERSION     CESAREAN SECTION     COLONOSCOPY     LOOP RECORDER INSERTION N/A 02/04/2018   Procedure: LOOP RECORDER INSERTION;  Surgeon: [name], MD;   Location: [facility];  Service: Cardiovascular;  Laterality: N/A;   ROTATOR CUFF REPAIR Left     Prior to Admission medications   Medication Sig Start Date End Date Taking? Authorizing Provider  acetaminophen (TYLENOL) 500 MG tablet Take 500 mg by mouth every 6 (six) hours as needed for mild pain or headache.   Yes [provider]  butalbital-acetaminophen-caffeine (FIORICET) 50-325-40 MG tablet Take 1 tablet by mouth every 8 (eight) hours as needed for headache or migraine.    Yes [provider]  butorphanol (STADOL) 10 MG/ML nasal spray Place 1 spray into the nose every 4 (four) hours as needed for headache or migraine.    Yes [provider]  cetirizine (ZYRTEC) 10 MG tablet Take 10 mg by mouth daily as needed for allergies or rhinitis.   Yes [provider]  clonazePAM (KLONOPIN) 1 MG tablet Take 1 mg by mouth 3 (three) times daily.   Yes [provider]  diltiazem (CARDIZEM CD) 120 MG 24 hr capsule Take 1 capsule (120 mg total) by mouth daily. 11/06/21  Yes Fenton, Clint R, PA  diltiazem (CARDIZEM) 30 MG tablet Take 1 Tablet Every 4 Hours As Needed For HR >100 11/15/21  Yes Fenton, Clint R, PA  dofetilide (TIKOSYN) 500 MCG capsule Take 1 capsule (500 mcg total) by mouth 2 (two) times daily. 11/15/21  Yes Camnitz, Will Hassell Done, MD  ELIQUIS 5 MG TABS tablet TAKE 1 TABLET BY MOUTH TWICE A DAY 07/23/21  Yes Camnitz, Will Hassell Done, MD  fluticasone-salmeterol (ADVAIR HFA) 115-21 MCG/ACT inhaler Inhale 2 puffs into the lungs 2 (two) times daily.   Yes [provider]  furosemide (LASIX) 20 MG tablet TAKE 1 TABLET BY MOUTH EVERY DAY AS NEEDED FOR EDEMA 07/23/21  Yes Camnitz, Will Hassell Done, MD  KLOR-CON M20 20 MEQ tablet TAKE 1 TABLET BY MOUTH EVERY DAY 02/26/21  Yes Allred, Jeneen Rinks, MD  pravastatin (PRAVACHOL) 40 MG tablet Take 40 mg by mouth at bedtime.  11/30/18  Yes [provider]  promethazine (PHENERGAN) 25 MG tablet Take 25 mg by mouth  every 6 (six) hours as needed for nausea or vomiting (from migraines).    Yes [provider]  sertraline (ZOLOFT) 50 MG tablet Take 50 mg by mouth at bedtime. 06/11/21  Yes [provider]  VENTOLIN HFA 108 (90 Base) MCG/ACT inhaler Inhale 2 puffs into the lungs every 6 (six) hours as needed for wheezing or shortness of breath.   Yes [provider]    Scheduled Meds:  Infusions:  sodium chloride     PRN Meds:    Allergies as of 11/20/2021 - Review Complete 11/20/2021  Allergen Reaction Noted  Pneumococcal vaccines Other (See Comments) 02/29/2020   Shingrix [zoster vac recomb adjuvanted] Other (See Comments) 02/29/2020   Other Other (See Comments) 02/29/2020   Meperidine Nausea And Vomiting and Other (See Comments) 12/21/2014   Prednisone Palpitations and Other (See Comments) 09/26/2016    Family History  Problem Relation Age of Onset   Heart attack Mother    Heart disease Mother    Heart disease Sister    Heart disease Sister    Heart disease Sister    Heart disease Sister    Heart disease Sister     Social History   Socioeconomic History   Marital status: Married    Spouse name: Not on file   Number of children: Not on file   Years of education: Not on file   Highest education level: Not on file  Occupational History   Not on file  Tobacco Use   Smoking status: Never   Smokeless tobacco: Never   Tobacco comments:    Never smoke 07/26/21  Vaping Use   Vaping Use: Never used  Substance and Sexual Activity   Alcohol use: Never   Drug use: Never   Sexual activity: Not on file  Other Topics Concern   Not on file  Social History Narrative   Lives in [place] with spouse   Retired from school system.   Social Determinants of Health   Financial Resource Strain: Not on file  Food Insecurity: Not on file  Transportation Needs: Not on file  Physical Activity: Not on file  Stress: Not on file  Social Connections: Not on file   Intimate Partner Violence: Not on file    REVIEW OF SYSTEMS: Constitutional: Weakness, fatigue as per HPI. ENT:  No nose bleeds Pulm: DOE as per HPI.  No active cough CV:  No palpitations, no LE edema.  Chest pressure with exertion as per HPI GU:  No hematuria, no frequency GI: See HPI. Heme: Bruises easily but no large hematomas.  No unusual bleeding. Transfusions: 1 unit PRBCs in 2018. Neuro:  No headaches, no peripheral tingling or numbness.  No syncope.  No seizures.  Some dizziness. Derm:  No itching, no rash or sores.  Endocrine:  No sweats or chills.  No polyuria or dysuria Immunization: Reviewed Travel: Not queried.   PHYSICAL EXAM: Vital signs in last 24 hours: Vitals:   11/20/21 1415 11/20/21 1430  BP: 100/89 119/67  Pulse: 95 81  Resp: (!) 24 20  Temp:    SpO2: 100% 100%   Wt Readings from Last 3 Encounters:  11/20/21 100.5 kg  09/20/21 100.4 kg  07/26/21 97.8 kg    General: Patient is pale.  Looks unwell. Head: No facial asymmetry or swelling.  No signs of head trauma. Eyes: Conjunctiva pale. Ears: Not hard of hearing Nose: No congestion or discharge Mouth: Poor dentition.  Many absent teeth.  Mucosa is slightly pale but moist and clear.  Tongue midline. Neck: No JVD, no masses, no thyromegaly. Lungs: Clear bilaterally.  No labored breathing at rest.  No cough. Heart: RRR with PVCs.  No MRG.  S1, S2 present Abdomen: Obese.  Soft.  Not tender.  Active bowel sounds.  No HSM, masses, bruits, hernias.   Rectal: Small amount of black appearing stool which was heme positive per Dr. Langston Masker.   Musc/Skeltl: No joint swelling, redness or gross deformity. Extremities: Nonpitting pedal swelling/obesity. Neurologic: Oriented x3.  Not confused.  Rambles in her history and needs redirection to stay on course and  answer specific questions. Skin: No telangiectasia, no significant bruising.  No sores or suspicious lesions. Nodes:  No cervical adenopathy.     Psych:  Cooperative.  Fluid speech.  Mildly perturbed by situation.  Expresses interest in going home but realizes she needs to stay here  Intake/Output from previous day: No intake/output data recorded. Intake/Output this shift: No intake/output data recorded.  LAB RESULTS: Recent Labs    11/20/21 1039 11/20/21 1340  WBC 5.5 5.1  HGB 5.2* 5.0*  HCT 19.4* 18.9*  PLT 284 303   BMET Lab Results  Component Value Date   NA 139 11/20/2021   NA 138 11/20/2021   NA 138 09/20/2021   K 4.1 11/20/2021   K 4.2 11/20/2021   K 4.4 09/20/2021   CL 111 11/20/2021   CL 112 (H) 11/20/2021   CL 105 09/20/2021   CO2 22 11/20/2021   CO2 20 (L) 11/20/2021   CO2 19 (L) 09/20/2021   GLUCOSE 117 (H) 11/20/2021   GLUCOSE 113 (H) 11/20/2021   GLUCOSE 98 09/20/2021   BUN 8 11/20/2021   BUN 8 11/20/2021   BUN 12 09/20/2021   CREATININE 0.82 11/20/2021   CREATININE 0.85 11/20/2021   CREATININE 0.90 09/20/2021   CALCIUM 9.1 11/20/2021   CALCIUM 9.0 11/20/2021   CALCIUM 9.6 09/20/2021   LFT No results for input(s): "PROT", "ALBUMIN", "AST", "ALT", "ALKPHOS", "BILITOT", "BILIDIR", "IBILI" in the last 72 hours. PT/INR Lab Results  Component Value Date   INR 1.5 (H) 11/20/2021   Hepatitis Panel No results for input(s): "HEPBSAG", "HCVAB", "HEPAIGM", "HEPBIGM" in the last 72 hours. C-Diff No components found for: "CDIFF" Lipase  No results found for: "LIPASE"  Drugs of Abuse  No results found for: "LABOPIA", "COCAINSCRNUR", "LABBENZ", "AMPHETMU", "THCU", "LABBARB"   RADIOLOGY STUDIES: DG Chest 2 View  Result Date: 11/20/2021 CLINICAL DATA:  71 year old female presenting for evaluation of shortness of breath. EXAM: CHEST - 2 VIEW COMPARISON:  May 09, 2021. FINDINGS: Cardiac loop recorder projects over the LEFT chest. Cardiomediastinal contours and hilar structures are stable. Lungs are clear. No sign of pleural effusion. No visible pneumothorax. On limited assessment no acute skeletal  findings. IMPRESSION: No active cardiopulmonary disease. Electronically Signed   By: Zetta Bills M.D.   On: 11/20/2021 13:53     IMPRESSION:   Microcytic anemia.  Profound.  Symptomatic for months.  Hx IDA in past years took oral iron but has not used this for 4 or more years.  FOBT positive stool described as dark/black by ED physician.  Patient says her stools have actually been yellow/brown.  Had colonoscopy/EGD in August 2018.  The EGD was normal including biopsies for celiac disease.  She had 3 small colon polyps (tubular adenomas, benign lymphoid type), nonbleeding hemorrhoids.  Significant daily heartburn.  Also complained of dysphagia.  Not on acid controlling medication.  Chronic Eliquis for A-fib.  Last dose was this morning.    PLAN:     EGD and colonoscopy on 8/3.  Clear liquids.  Begin daily oral Protonix.  Ordered 2 doses of Dulcolax beginning tomorrow morning.  Will write prep orders tomorrow.  2 PRBCs ordered by ED physician.  Follow-up CBC.  Iron studies, folate and B12 studies are ordered.  Hold Eliquis.     Azucena Freed  11/20/2021, 2:54 PM Phone [phone]

## 2021-11-21 ENCOUNTER — Other Ambulatory Visit: Payer: Self-pay

## 2021-11-21 ENCOUNTER — Ambulatory Visit (HOSPITAL_COMMUNITY): Payer: Medicare Other | Admitting: Physician Assistant

## 2021-11-21 ENCOUNTER — Encounter (HOSPITAL_COMMUNITY): Payer: Self-pay | Admitting: Internal Medicine

## 2021-11-21 DIAGNOSIS — K921 Melena: Secondary | ICD-10-CM

## 2021-11-21 DIAGNOSIS — R600 Localized edema: Secondary | ICD-10-CM | POA: Diagnosis not present

## 2021-11-21 DIAGNOSIS — I4891 Unspecified atrial fibrillation: Secondary | ICD-10-CM | POA: Diagnosis not present

## 2021-11-21 DIAGNOSIS — E785 Hyperlipidemia, unspecified: Secondary | ICD-10-CM | POA: Diagnosis not present

## 2021-11-21 DIAGNOSIS — D500 Iron deficiency anemia secondary to blood loss (chronic): Secondary | ICD-10-CM

## 2021-11-21 DIAGNOSIS — Z7901 Long term (current) use of anticoagulants: Secondary | ICD-10-CM

## 2021-11-21 DIAGNOSIS — D649 Anemia, unspecified: Secondary | ICD-10-CM

## 2021-11-21 LAB — CBC
HCT: 23.1 % — ABNORMAL LOW (ref 36.0–46.0)
HCT: 25.8 % — ABNORMAL LOW (ref 36.0–46.0)
HCT: 27.1 % — ABNORMAL LOW (ref 36.0–46.0)
Hemoglobin: 7 g/dL — ABNORMAL LOW (ref 12.0–15.0)
Hemoglobin: 8.1 g/dL — ABNORMAL LOW (ref 12.0–15.0)
Hemoglobin: 8.4 g/dL — ABNORMAL LOW (ref 12.0–15.0)
MCH: 21.7 pg — ABNORMAL LOW (ref 26.0–34.0)
MCH: 22.8 pg — ABNORMAL LOW (ref 26.0–34.0)
MCH: 22.4 pg — ABNORMAL LOW (ref 26.0–34.0)
MCHC: 30.3 g/dL (ref 30.0–36.0)
MCHC: 31.4 g/dL (ref 30.0–36.0)
MCHC: 31 g/dL (ref 30.0–36.0)
MCV: 71.7 fL — ABNORMAL LOW (ref 80.0–100.0)
MCV: 72.5 fL — ABNORMAL LOW (ref 80.0–100.0)
MCV: 72.3 fL — ABNORMAL LOW (ref 80.0–100.0)
Platelets: 224 10*3/uL (ref 150–400)
Platelets: 226 10*3/uL (ref 150–400)
Platelets: 256 10*3/uL (ref 150–400)
RBC: 3.22 MIL/uL — ABNORMAL LOW (ref 3.87–5.11)
RBC: 3.56 MIL/uL — ABNORMAL LOW (ref 3.87–5.11)
RBC: 3.75 MIL/uL — ABNORMAL LOW (ref 3.87–5.11)
RDW: 22.1 % — ABNORMAL HIGH (ref 11.5–15.5)
RDW: 21.8 % — ABNORMAL HIGH (ref 11.5–15.5)
RDW: 21.9 % — ABNORMAL HIGH (ref 11.5–15.5)
WBC: 6.1 10*3/uL (ref 4.0–10.5)
WBC: 6 10*3/uL (ref 4.0–10.5)
WBC: 6.4 10*3/uL (ref 4.0–10.5)
nRBC: 0 % (ref 0.0–0.2)
nRBC: 0 % (ref 0.0–0.2)
nRBC: 0 % (ref 0.0–0.2)

## 2021-11-21 LAB — COMPREHENSIVE METABOLIC PANEL
ALT: 8 U/L (ref 0–44)
AST: 17 U/L (ref 15–41)
Albumin: 3 g/dL — ABNORMAL LOW (ref 3.5–5.0)
Alkaline Phosphatase: 55 U/L (ref 38–126)
Anion gap: 5 (ref 5–15)
BUN: 6 mg/dL — ABNORMAL LOW (ref 8–23)
CO2: 21 mmol/L — ABNORMAL LOW (ref 22–32)
Calcium: 8.4 mg/dL — ABNORMAL LOW (ref 8.9–10.3)
Chloride: 114 mmol/L — ABNORMAL HIGH (ref 98–111)
Creatinine, Ser: 0.7 mg/dL (ref 0.44–1.00)
GFR, Estimated: >60 mL/min (ref >60)
Glucose, Bld: 90 mg/dL (ref 70–99)
Potassium: 4 mmol/L (ref 3.5–5.1)
Sodium: 140 mmol/L (ref 135–145)
Total Bilirubin: 0.4 mg/dL (ref 0.3–1.2)
Total Protein: 6 g/dL — ABNORMAL LOW (ref 6.5–8.1)

## 2021-11-21 LAB — PREPARE RBC (CROSSMATCH)

## 2021-11-21 MED ORDER — FUROSEMIDE 20 MG PO TABS
20.0000 mg | ORAL_TABLET | Freq: Every day | ORAL | Status: DC
  Administered 2021-11-21 – 2021-11-24 (×4): 20 mg via ORAL
  Filled 2021-11-21 (×4): qty 1

## 2021-11-21 MED ORDER — POLYETHYLENE GLYCOL 3350 17 GM/SCOOP PO POWD
1.0000 | Freq: Once | ORAL | Status: AC
Start: 2021-11-21 — End: 2021-11-21
  Administered 2021-11-21: 238 g via ORAL
  Filled 2021-11-21: qty 255

## 2021-11-21 MED ORDER — SODIUM CHLORIDE 0.9 % IV SOLN
510.0000 mg | Freq: Once | INTRAVENOUS | Status: AC
  Administered 2021-11-21: 510 mg via INTRAVENOUS
  Filled 2021-11-21: qty 17

## 2021-11-21 MED ORDER — SODIUM CHLORIDE 0.9% IV SOLUTION: Freq: Once | INTRAVENOUS | Status: DC

## 2021-11-21 MED ORDER — PANTOPRAZOLE SODIUM 40 MG IV SOLR
40.0000 mg | INTRAVENOUS | Status: DC
  Administered 2021-11-21 – 2021-11-23 (×3): 40 mg via INTRAVENOUS
  Filled 2021-11-21 (×3): qty 10

## 2021-11-21 MED ORDER — BISACODYL 5 MG PO TBEC
20.0000 mg | DELAYED_RELEASE_TABLET | Freq: Once | ORAL | Status: AC
  Administered 2021-11-21: 20 mg via ORAL
  Filled 2021-11-21: qty 4

## 2021-11-21 MED ORDER — METOCLOPRAMIDE HCL 5 MG/ML IJ SOLN
10.0000 mg | Freq: Four times a day (QID) | INTRAMUSCULAR | Status: AC
  Administered 2021-11-21 – 2021-11-22 (×2): 10 mg via INTRAVENOUS
  Filled 2021-11-21 (×2): qty 2

## 2021-11-21 NOTE — Plan of Care (Signed)
  Problem: Education: Goal: Knowledge of General Education information will improve Description: Including pain rating scale, medication(s)/side effects and non-pharmacologic comfort measures Outcome: Completed/Met

## 2021-11-21 NOTE — Hospital Course (Addendum)
No blood or dark stool. Watery diarrhea. Stopped bisacodyl  Feels ok at the moment, had infusion of two units of blood.  Endorses feeling better, no stomach pain,   Legs: Bilateral swelling, ordered lasix 20 mg  Mentions she has had colonoscopy before and wants extra sedation due to previous experience  Able to walk from here to bathroom now, so a lot of progress.     8/3   Discontinued telemetry   Pt has had diarrhea all night   Informed about likely outpatient follow up   Pt feels swelling has improved a little   Been peeing a lot because of lasix   PT eval put in for tomorrow   Pt reports increased mobility from when she was at home   BLE extremity edema, non-pitting edema   Denies stomach pain Will order diet for her if GI approves    8/4: Pt is moving around, states she is feeling better., relaxing sitting in chair. Denies abdominal pain   Endorses legs doing better, continues peeing   Explained pt colonoscopy results to her, seems to understand She had a capsule camera done yesterday as well   Explained good to go if GI approves  States her swelling ahs improved since she's gotten here  Will speak with GI to see if restarting eliquis is good idea   PCP appointment on Wednesday with Dr. York Ram  8/5: -

## 2021-11-21 NOTE — TOC Initial Note (Signed)
Transition of Care [facility]) - Initial/Assessment Note    Patient Details  Name: Nicole [name] MRN: [id number] Date of Birth: [birth year]  Transition of Care [facility]) CM/SW Contact:    Tom-Johnson, Renea Ee, RN Phone Number: 11/21/2021, 5:28 PM  Clinical Narrative:                  CM spoke with patient at bedside about needs for post hospital transition. Admitted for Symptomatic Anemia. Hgb on admission was 5.2, 2 unit PRBC has been given, now reads 8.2. On Eliquis for A-fib, on hold for potential GI bleed. Has scheduled EGD and Colonoscopy tomorrow 11/22/21.  From home with husband. Had two children. Currently retired from [employer]. Independent with care and drive self prior to admission. Has a shower seat at home.  PCP is Harvie Junior, MD and uses [facility] in [place].   Expected Discharge Plan: Home/Self Care Barriers to Discharge: Continued Medical Work up   Patient Goals and CMS Choice Patient states their goals for this hospitalization and ongoing recovery are:: To return home CMS Medicare.gov Compare Post Acute Care list provided to:: Patient Choice offered to / list presented to : NA  Expected Discharge Plan and Services Expected Discharge Plan: Home/Self Care   Discharge Planning Services: CM Consult Post Acute Care Choice: NA Living arrangements for the past 2 months: Single Family Home                 DME Arranged: N/A DME Agency: NA       HH Arranged: NA HH Agency: NA        Prior Living Arrangements/Services Living arrangements for the past 2 months: Single Family Home Lives with:: Spouse Patient language and need for interpreter reviewed:: Yes Do you feel safe going back to the place where you live?: Yes      Need for Family Participation in Patient Care: Yes (Comment) Care giver support system in place?: Yes (comment) Current home services: DME (Shower seat) Criminal Activity/Legal Involvement Pertinent to Current  Situation/Hospitalization: No - Comment as needed  Activities of Daily Living Home Assistive Devices/Equipment: None ADL Screening (condition at time of admission) Patient's cognitive ability adequate to safely complete daily activities?: Yes Is the patient deaf or have difficulty hearing?: No Does the patient have difficulty seeing, even when wearing glasses/contacts?: No Does the patient have difficulty concentrating, remembering, or making decisions?: No Patient able to express need for assistance with ADLs?: Yes Does the patient have difficulty dressing or bathing?: No Independently performs ADLs?: Yes (appropriate for developmental age) Does the patient have difficulty walking or climbing stairs?: No Weakness of Legs: None Weakness of Arms/Hands: None  Permission Sought/Granted Permission sought to share information with : Case Manager, Family Supports Permission granted to share information with : Yes, Verbal Permission Granted              Emotional Assessment Appearance:: Appears stated age Attitude/Demeanor/Rapport: Engaged, Gracious Affect (typically observed): Accepting, Appropriate, Calm, Hopeful, Pleasant Orientation: : Oriented to Self, Oriented to Place, Oriented to  Time, Oriented to Situation Alcohol / Substance Use: Not Applicable Psych Involvement: No (comment)  Admission diagnosis:  Melena [K92.1] Symptomatic anemia [D64.9] Patient Active Problem List   Diagnosis Date Noted   Melena    Iron deficiency anemia due to chronic blood loss    Secondary hypercoagulable state ([place]) 03/08/2019   Atrial flutter with rapid ventricular response ([place]) 12/31/2017   Anxiety 10/06/2017   Chest pain 10/06/2017   Arthritis  10/06/2017   H/O: GI bleed 12/11/2016   GI bleed 11/24/2016   Symptomatic anemia 11/24/2016   Generalized weakness 07/05/2016   Atrial fibrillation, chronic ([place]) 07/05/2016   Chest discomfort 12/19/2015   UTI (urinary tract infection) 12/19/2015    Essential hypertension 08/15/2015   Hyperlipidemia 11/17/2014   Paroxysmal atrial fibrillation (H[facility]) 11/17/2014   Anticoagulant long-term use 11/16/2014   Asthma 11/16/2014   Esophageal reflux 11/16/2014   Migraine headache 11/16/2014   Nausea 11/16/2014   Pain in limb 11/16/2014   Panic disorder 11/16/2014   Swelling of joint 11/16/2014   Tendonitis 11/16/2014   PCP:  Harvie Junior, MD Pharmacy:   [id number] [place], [place] - [id number] [id number]: [phone]: [phone]    Social Determinants of Health (SDOH) Interventions    Readmission Risk Interventions     No data to display

## 2021-11-21 NOTE — Progress Notes (Signed)
          Daily Rounding Note  11/21/2021, 9:49 AM  LOS: 1 day   SUBJECTIVE:   Chief complaint: anemia.  Blood loss anemia.  IDA     Feels sooo much better after transfusion.  Passing non melenic BMs  OBJECTIVE:         Vital signs in last 24 hours:    Temp:  [98 F (36.7 C)-99 F (37.2 C)] 98.8 F (37.1 C) (08/02 0850) Pulse Rate:  [72-95] 82 (08/02 0850) Resp:  [14-24] 18 (08/02 0850) BP: (100-160)/(58-92) 133/60 (08/02 0850) SpO2:  [97 %-100 %] 99 % (08/02 0850) Weight:  [100.5 kg] 100.5 kg (08/01 1853) Last BM Date : 11/20/21 Filed Weights   11/20/21 1853  Weight: 100.5 kg   General: alert, NAD.  No longer pale and looks better   Heart: RRR Chest: clear bil Abdomen: soft, NT, ND.  Active BS  Extremities: no CCE Neuro/Psych:  oriented x 3.  Garrulous and chatty and appropriate.  No deficits of tremors  Intake/Output from previous day: 08/01 0701 - 08/02 0700 In: 628 [P.O.:240; Blood:388] Out: -   Intake/Output this shift: Total I/O In: 400 [P.O.:400] Out: -   Lab Results: Recent Labs    11/20/21 1039 11/20/21 1340 11/21/21 0357  WBC 5.5 5.1 6.1  HGB 5.2* 5.0* 7.0*  HCT 19.4* 18.9* 23.1*  PLT 284 303 224   BMET Recent Labs    11/20/21 1039 11/20/21 1340 11/21/21 0357  NA 138 139 140  K 4.2 4.1 4.0  CL 112* 111 114*  CO2 20* 22 21*  GLUCOSE 113* 117* 90  BUN 8 8 6*  CREATININE 0.85 0.82 0.70  CALCIUM 9.0 9.1 8.4*   LFT Recent Labs    11/21/21 0357  PROT 6.0*  ALBUMIN 3.0*  AST 17  ALT 8  ALKPHOS 55  BILITOT 0.4   PT/INR Recent Labs    11/20/21 1340  LABPROT 17.9*  INR 1.5*   Hepatitis Panel No results for input(s): "HEPBSAG", "HCVAB", "HEPAIGM", "HEPBIGM" in the last 72 hours.  Studies/Results: DG Chest 2 View  Result Date: 11/20/2021 CLINICAL DATA:  71 year old female presenting for evaluation of shortness of breath. EXAM: CHEST - 2 VIEW COMPARISON:  May 09, 2021.  FINDINGS: Cardiac loop recorder projects over the LEFT chest. Cardiomediastinal contours and hilar structures are stable. Lungs are clear. No sign of pleural effusion. No visible pneumothorax. On limited assessment no acute skeletal findings. IMPRESSION: No active cardiopulmonary disease. Electronically Signed   By: Zetta Bills M.D.   On: 11/20/2021 13:53    ASSESMENT:      Profound, symptomatic microcytic anemia.  Previous iron supplement for IDA several years ago.  Hgb 5.. 2 PRBCs.. 7.  Low iron 14.  Low ferritin 4.  Low iron sats 3%.  Folate, B12 normal.  FOBT positive stool.  Tubular adenoma and lymphoid type polyps at colonoscopy 2018.  Normal EGD 2018.  Chronic Eliquis for A-fib.  Last dose AM 8/1.   PLAN   EGD and colonoscopy 8/3 @ 10:454 AM.      See prep orders.      Azucena Freed  11/21/2021, 9:49 AM Phone [phone]

## 2021-11-21 NOTE — H&P (View-Only) (Signed)
          Daily Rounding Note  11/21/2021, 9:49 AM  LOS: 1 day   SUBJECTIVE:   Chief complaint: anemia.  Blood loss anemia.  IDA     Feels sooo much better after transfusion.  Passing non melenic BMs  OBJECTIVE:         Vital signs in last 24 hours:    Temp:  [98 F (36.7 C)-99 F (37.2 C)] 98.8 F (37.1 C) (08/02 0850) Pulse Rate:  [72-95] 82 (08/02 0850) Resp:  [14-24] 18 (08/02 0850) BP: (100-160)/(58-92) 133/60 (08/02 0850) SpO2:  [97 %-100 %] 99 % (08/02 0850) Weight:  [100.5 kg] 100.5 kg (08/01 1853) Last BM Date : 11/20/21 Filed Weights   11/20/21 1853  Weight: 100.5 kg   General: alert, NAD.  No longer pale and looks better   Heart: RRR Chest: clear bil Abdomen: soft, NT, ND.  Active BS  Extremities: no CCE Neuro/Psych:  oriented x 3.  Garrulous and chatty and appropriate.  No deficits of tremors  Intake/Output from previous day: 08/01 0701 - 08/02 0700 In: 628 [P.O.:240; Blood:388] Out: -   Intake/Output this shift: Total I/O In: 400 [P.O.:400] Out: -   Lab Results: Recent Labs    11/20/21 1039 11/20/21 1340 11/21/21 0357  WBC 5.5 5.1 6.1  HGB 5.2* 5.0* 7.0*  HCT 19.4* 18.9* 23.1*  PLT 284 303 224   BMET Recent Labs    11/20/21 1039 11/20/21 1340 11/21/21 0357  NA 138 139 140  K 4.2 4.1 4.0  CL 112* 111 114*  CO2 20* 22 21*  GLUCOSE 113* 117* 90  BUN 8 8 6*  CREATININE 0.85 0.82 0.70  CALCIUM 9.0 9.1 8.4*   LFT Recent Labs    11/21/21 0357  PROT 6.0*  ALBUMIN 3.0*  AST 17  ALT 8  ALKPHOS 55  BILITOT 0.4   PT/INR Recent Labs    11/20/21 1340  LABPROT 17.9*  INR 1.5*   Hepatitis Panel No results for input(s): "HEPBSAG", "HCVAB", "HEPAIGM", "HEPBIGM" in the last 72 hours.  Studies/Results: DG Chest 2 View  Result Date: 11/20/2021 CLINICAL DATA:  71 year old female presenting for evaluation of shortness of breath. EXAM: CHEST - 2 VIEW COMPARISON:  May 09, 2021.  FINDINGS: Cardiac loop recorder projects over the LEFT chest. Cardiomediastinal contours and hilar structures are stable. Lungs are clear. No sign of pleural effusion. No visible pneumothorax. On limited assessment no acute skeletal findings. IMPRESSION: No active cardiopulmonary disease. Electronically Signed   By: Zetta Bills M.D.   On: 11/20/2021 13:53    ASSESMENT:      Profound, symptomatic microcytic anemia.  Previous iron supplement for IDA several years ago.  Hgb 5.. 2 PRBCs.. 7.  Low iron 14.  Low ferritin 4.  Low iron sats 3%.  Folate, B12 normal.  FOBT positive stool.  Tubular adenoma and lymphoid type polyps at colonoscopy 2018.  Normal EGD 2018.  Chronic Eliquis for A-fib.  Last dose AM 8/1.   PLAN   EGD and colonoscopy 8/3 @ 10:454 AM.      See prep orders.      Azucena Freed  11/21/2021, 9:49 AM Phone [phone]

## 2021-11-21 NOTE — Progress Notes (Addendum)
   Subjective:  Patient is complaining of watery diarrhea overnight. States that she is feeling well overall and has been able to ambulate to the bathroom independently. Denies bloody or dark stool, abdominal pain. Discussed plan for GI to perform EGD and colonoscopy on 8/3.    Objective:  Vital signs in last 24 hours: Vitals:   11/20/21 2110 11/20/21 2131 11/21/21 0033 11/21/21 0645  BP: 131/64 (!) 142/63 128/70 130/68  Pulse: 89 89 84 84  Resp: '16 16 16 16  '$ Temp: 98.6 F (37 C) 99 F (37.2 C) 98 F (36.7 C) 98.5 F (36.9 C)  TempSrc: Oral Oral Oral Oral  SpO2: 100% 99% 97% 98%  Weight:      Height:       Constitutional:      General: She is not in acute distress. Cardiovascular:     Rate and Rhythm: Normal rate and regular rhythm.    General: no murmurs, rubs, or gallops.  Pulmonary:     Effort: Pulmonary effort is normal.        General: No respiratory distress. No wheezes, rales, rhonchi.  Abdominal:     General: There is no distension. Normal bowel sounds.     Tenderness: There is no abdominal tenderness.  Skin:     General: Skin is warm and dry. Normal skin turgor.  Musculoskeletal:     General: 3+ edema of bilateral LE.  Neurological:     Mental Status: She is alert. Mental status is at baseline.   Assessment/Plan:  Principal Problem:   Symptomatic anemia  Symptomatic anemia with suspected GI bleed Patient on long-term Eliquis. Patient received 2 units of pRBCs overnight. Hgb improved to 8.1 today. Patient remains hemodynamically stable. EGD and colonoscopy scheduled for 8/3 with GI. Patient has microcytic anemia with low iron of 14 and low ferritin of 4. Folate, TIBC, vitamin B12 normal.  - Eliquis held - Will trend CBC - Starting Feraheme for anemia - Discontinued bisacodyl due to diarrhea  2. Bilateral LE swelling - Restarting home lasix (20 mg daily).   3. Atrial fibrillation Not currently in a-fib. No tachycardia.  Eliquis held for potential GI  bleed.  Continue home medications: - Diltiazem (120 mg daily) - Dofetilide (500 mcg, 2X daily)   4. Asthma Continue home medications:  - Dulera (2 puffs, 2X daily) - Albuterol (2.5 mg, inhalation, q6 PRN)   5. Esophageal Reflux Continue home medications: - Protonix (40 mg, 1X daily) switched from oral to IV for potential GI bleed.    6. Hyperlipidemia Continue home medications:  - Pravastatin ('40mg'$ , 1X daily)   7. Anxiety Continue home medications: - Sertraline ('50mg'$ , 1X daily) - Clonazepam ('1mg'$ , 3X daily)   Diet: clear liquid DVT: none IVF: 0.9% sodium chloride Bowel: none Code: full code   Prior to Admission Living Arrangement: Anticipated Discharge Location: Barriers to Discharge: Dispo: Anticipated discharge in approximately more than 2 day(s).   Starlyn Skeans, MD 11/21/2021, 7:18 AM Pager: [phone] After 5pm on weekdays and 1pm on weekends: On Call pager [phone]     Internal Medicine Attending:   I saw and examined the patient. I reviewed the resident's note and I agree with the resident's findings and plan as documented in the resident's note.    See my H&P addendum for full details    Eliquis contributed to Melena

## 2021-11-22 ENCOUNTER — Inpatient Hospital Stay (HOSPITAL_COMMUNITY): Payer: Medicare Other | Admitting: Anesthesiology

## 2021-11-22 ENCOUNTER — Other Ambulatory Visit (HOSPITAL_COMMUNITY): Payer: Self-pay | Admitting: Cardiology

## 2021-11-22 ENCOUNTER — Encounter (HOSPITAL_COMMUNITY): Payer: Self-pay | Admitting: Internal Medicine

## 2021-11-22 ENCOUNTER — Encounter (HOSPITAL_COMMUNITY)
Admission: EM | Disposition: A | Discharge status: Home / Self Care | Payer: Self-pay | Source: Home / Self Care | Attending: Internal Medicine

## 2021-11-22 DIAGNOSIS — D122 Benign neoplasm of ascending colon: Secondary | ICD-10-CM

## 2021-11-22 DIAGNOSIS — J45909 Unspecified asthma, uncomplicated: Secondary | ICD-10-CM | POA: Diagnosis not present

## 2021-11-22 DIAGNOSIS — D500 Iron deficiency anemia secondary to blood loss (chronic): Secondary | ICD-10-CM | POA: Diagnosis not present

## 2021-11-22 DIAGNOSIS — D509 Iron deficiency anemia, unspecified: Secondary | ICD-10-CM | POA: Diagnosis not present

## 2021-11-22 DIAGNOSIS — K3189 Other diseases of stomach and duodenum: Secondary | ICD-10-CM

## 2021-11-22 DIAGNOSIS — K259 Gastric ulcer, unspecified as acute or chronic, without hemorrhage or perforation: Secondary | ICD-10-CM | POA: Diagnosis not present

## 2021-11-22 DIAGNOSIS — K635 Polyp of colon: Secondary | ICD-10-CM

## 2021-11-22 HISTORY — PX: GIVENS CAPSULE STUDY: SHX5432

## 2021-11-22 HISTORY — PX: ESOPHAGOGASTRODUODENOSCOPY (EGD) WITH PROPOFOL: SHX5813

## 2021-11-22 HISTORY — PX: COLONOSCOPY: SHX5424

## 2021-11-22 HISTORY — PX: BIOPSY: SHX5522

## 2021-11-22 HISTORY — PX: POLYPECTOMY: SHX5525

## 2021-11-22 LAB — CBC
HCT: 27.4 % — ABNORMAL LOW (ref 36.0–46.0)
HCT: 29.8 % — ABNORMAL LOW (ref 36.0–46.0)
Hemoglobin: 8.7 g/dL — ABNORMAL LOW (ref 12.0–15.0)
Hemoglobin: 8.9 g/dL — ABNORMAL LOW (ref 12.0–15.0)
MCH: 23 pg — ABNORMAL LOW (ref 26.0–34.0)
MCH: 22.2 pg — ABNORMAL LOW (ref 26.0–34.0)
MCHC: 31.8 g/dL (ref 30.0–36.0)
MCHC: 29.9 g/dL — ABNORMAL LOW (ref 30.0–36.0)
MCV: 72.3 fL — ABNORMAL LOW (ref 80.0–100.0)
MCV: 74.3 fL — ABNORMAL LOW (ref 80.0–100.0)
Platelets: 236 10*3/uL (ref 150–400)
Platelets: 251 10*3/uL (ref 150–400)
RBC: 3.79 MIL/uL — ABNORMAL LOW (ref 3.87–5.11)
RBC: 4.01 MIL/uL (ref 3.87–5.11)
RDW: 22 % — ABNORMAL HIGH (ref 11.5–15.5)
RDW: 22.3 % — ABNORMAL HIGH (ref 11.5–15.5)
WBC: 8.8 10*3/uL (ref 4.0–10.5)
WBC: 6.7 10*3/uL (ref 4.0–10.5)
nRBC: 0 % (ref 0.0–0.2)
nRBC: 0 % (ref 0.0–0.2)

## 2021-11-22 LAB — TYPE AND SCREEN
ABO/RH(D): O POS
Antibody Screen: NEGATIVE
Unit division: 0
Unit division: 0
Unit division: 0

## 2021-11-22 LAB — BPAM RBC
Blood Product Expiration Date: 202308092359
Blood Product Expiration Date: 202308292359
Blood Product Expiration Date: 202308292359
ISSUE DATE / TIME: 202308011541
ISSUE DATE / TIME: 202308012109
ISSUE DATE / TIME: 202308021038
Unit Type and Rh: 5100
Unit Type and Rh: 5100
Unit Type and Rh: 5100

## 2021-11-22 SURGERY — ESOPHAGOGASTRODUODENOSCOPY (EGD) WITH PROPOFOL
Anesthesia: Monitor Anesthesia Care

## 2021-11-22 MED ORDER — PROPOFOL 10 MG/ML IV BOLUS
INTRAVENOUS | Status: DC | PRN
  Administered 2021-11-22: 30 mg via INTRAVENOUS

## 2021-11-22 MED ORDER — MIDAZOLAM HCL 2 MG/2ML IJ SOLN
INTRAMUSCULAR | Status: AC
  Filled 2021-11-22: qty 2

## 2021-11-22 MED ORDER — PROPOFOL 500 MG/50ML IV EMUL
INTRAVENOUS | Status: DC | PRN
  Administered 2021-11-22: 100 ug/kg/min via INTRAVENOUS

## 2021-11-22 MED ORDER — SODIUM CHLORIDE 0.9 % IV SOLN: INTRAVENOUS | Status: DC | PRN

## 2021-11-22 MED ORDER — MIDAZOLAM HCL 2 MG/2ML IJ SOLN
INTRAMUSCULAR | Status: DC | PRN
  Administered 2021-11-22: 2 mg via INTRAVENOUS

## 2021-11-22 MED ORDER — SODIUM CHLORIDE 0.9 % IV SOLN
INTRAVENOUS | Status: AC | PRN
  Administered 2021-11-22: 500 mL via INTRAVENOUS

## 2021-11-22 SURGICAL SUPPLY — 15 items

## 2021-11-22 NOTE — Anesthesia Preprocedure Evaluation (Signed)
Anesthesia Evaluation  Patient identified by MRN, date of birth, ID band Patient awake    Reviewed: Allergy & Precautions, NPO status , Patient's Chart, lab work & pertinent test results  History of Anesthesia Complications Negative for: history of anesthetic complications  Airway Mallampati: II  TM Distance: >3 FB Neck ROM: Full    Dental  (+) Poor Dentition, Missing, Loose,    Pulmonary asthma ,    Pulmonary exam normal        Cardiovascular hypertension, Normal cardiovascular exam+ dysrhythmias Atrial Fibrillation      Neuro/Psych negative neurological ROS     GI/Hepatic Neg liver ROS, GERD  ,  Endo/Other  negative endocrine ROS  Renal/GU negative Renal ROS  negative genitourinary   Musculoskeletal negative musculoskeletal ROS (+)   Abdominal   Peds  Hematology  (+) Blood dyscrasia, anemia ,   Anesthesia Other Findings   Reproductive/Obstetrics                           Anesthesia Physical Anesthesia Plan  ASA: 3  Anesthesia Plan: MAC   Post-op Pain Management: Minimal or no pain anticipated   Induction: Intravenous  PONV Risk Score and Plan: 2 and Propofol infusion, TIVA and Treatment may vary due to age or medical condition  Airway Management Planned: Natural Airway, Nasal Cannula and Simple Face Mask  Additional Equipment: None  Intra-op Plan:   Post-operative Plan:   Informed Consent: I have reviewed the patients History and Physical, chart, labs and discussed the procedure including the risks, benefits and alternatives for the proposed anesthesia with the patient or authorized representative who has indicated his/her understanding and acceptance.       Plan Discussed with:   Anesthesia Plan Comments:         Anesthesia Quick Evaluation

## 2021-11-22 NOTE — Interval H&P Note (Signed)
History and Physical Interval Note:  11/22/2021 2:26 PM  Nicole [name]  has presented today for surgery, with the diagnosis of FOBT positive.  Symptomatic severe microcytic anemia.  History of IDA some years ago.  Chronic Eliquis for A-fib.  The various methods of treatment have been discussed with the patient and family. After consideration of risks, benefits and other options for treatment, the patient has consented to  Procedure(s): ESOPHAGOGASTRODUODENOSCOPY (EGD) WITH PROPOFOL (N/A) COLONOSCOPY (N/A) as a surgical intervention.  The patient's history has been reviewed, patient examined, no change in status, stable for surgery.  I have reviewed the patient's chart and labs.  Questions were answered to the patient's satisfaction.     Daryel November

## 2021-11-22 NOTE — Progress Notes (Signed)
Subjective:  Patient continued to have diarrhea last night likely secondary to her bowel prep.  She denies abdominal pain.  Patient states that she has been peeing a lot due to the Lasix.  She states that her lower extremity swelling has improved mildly.  Patient states that she is feeling strong and able to ambulate independently to the bathroom and back.  She reports increased mobility compared to when she was at home prior to hospitalization.  Patient is scheduled to undergo EGD and colonoscopy today with GI.  Objective:  Vital signs in last 24 hours: Vitals:   11/22/21 0947 11/22/21 1304 11/22/21 1518 11/22/21 1533  BP: (!) 148/83 (!) 158/78 121/69 (!) 141/79  Pulse: 86 82 76 74  Resp: '19 14 16 16  '$ Temp: 98.8 F (37.1 C) 98.7 F (37.1 C) (!) 97.2 F (36.2 C)   TempSrc: Oral Temporal    SpO2: 98% 95% 99% 98%  Weight:  98.9 kg    Height:  '5\' 8"'$  (1.727 m)     Physical Exam:  Constitutional:      General: She is not in acute distress. Cardiovascular:     Rate and Rhythm: Normal rate and regular rhythm.    General: no murmurs, rubs, or gallops.  Pulmonary:     Effort: Pulmonary effort is normal.        General: No respiratory distress. No wheezes, rales, rhonchi.  Abdominal:     General: There is no distension. Normal bowel sounds.     Tenderness: There is no abdominal tenderness.  Skin:     General: Skin is warm and dry.  Musculoskeletal:     General: 2+ non-pitting edema of bilateral LE. Good posterior tibial pulses. Neurological:     Mental Status: She is alert. Mental status is at baseline.   Assessment/Plan:  Principal Problem:   Symptomatic anemia Active Problems:   Melena   Iron deficiency anemia due to chronic blood loss   Patient on long-term Eliquis which is likely contributing to her presentation.  Hemoglobin improved to 8.7 today.  Iron is low at 14.  Ferritin is low at 4.  Hemodynamically stable.  EGD and colonoscopy scheduled for today with GI.  Will  follow-up on findings from GI and recommendations.  Symptomatic anemia with suspected GI bleed Patient on long-term Eliquis which is likely contributing to her presentation.  Hemoglobin improved to 8.7 today. Has microcytic anemia likely secondary to potential GI bleed. Hemodynamically stable.  Feraheme given yesterday.  EGD and colonoscopy scheduled for today with GI.  Will follow-up on findings from GI and recommendations. - Eliquis held - Trend CBC - Will transition diet as tolerated   2. Bilateral LE swelling - Continue home lasix (20 mg daily). - BMP ordered    3. Atrial fibrillation Not in a-fib currently. No tachycardia present. Eliquis held for potential GI bleed.  Will continue home medications: - Diltiazem (120 mg daily) - Dofetilide (500 mcg, 2X daily)   4. Asthma Will continue home medications:  - Dulera (2 puffs, 2X daily) - Albuterol (2.5 mg, inhalation, q6 PRN)   5. Esophageal Reflux - Continue IV Protonix (40 mg, 1X daily) for potential GI bleed.    6. Hyperlipidemia Will continue home medications:  - Pravastatin ('40mg'$ , 1X daily)   7. Anxiety Will continue home medications: - Sertraline ('50mg'$ , 1X daily) - Clonazepam ('1mg'$ , 3X daily)     Diet: clear liquid DVT: none IVF: 0.9% sodium chloride Bowel: none Code: full code  Prior to Admission Living Arrangement: home Anticipated Discharge Location: home Barriers to Discharge: continued management Dispo: Anticipated discharge in approximately less than 2 day(s).   Starlyn Skeans, MD 11/22/2021, 3:37 PM Pager: [phone] After 5pm on weekdays and 1pm on weekends: On Call pager [phone]

## 2021-11-22 NOTE — Op Note (Signed)
[facility] Patient Name: Nicole [name] Procedure Date : 11/22/2021 MRN: [id number] Attending MD: Gladstone Pih. Candis Schatz , MD Date of Birth: 1950-06-21 CSN: [id number] Age: 71 Admit Type: Inpatient Procedure:                Upper GI endoscopy Indications:              Iron deficiency anemia Providers:                Nicki Reaper E. Candis Schatz, MD, Mikey College, RN, Burtis Junes, RN, Gloris Ham, Technician Referring MD:              Medicines:                Monitored Anesthesia Care Complications:            No immediate complications. Estimated Blood Loss:     Estimated blood loss was minimal. Procedure:                Pre-Anesthesia Assessment:                           - Prior to the procedure, a History and Physical                            was performed, and patient medications and                            allergies were reviewed. The patient's tolerance of                            previous anesthesia was also reviewed. The risks                            and benefits of the procedure and the sedation                            options and risks were discussed with the patient.                            All questions were answered, and informed consent                            was obtained. Prior Anticoagulants: The patient has                            taken Eliquis (apixaban), last dose was 2 days                            prior to procedure. ASA Grade Assessment: III - A                            patient with severe systemic disease. After  reviewing the risks and benefits, the patient was                            deemed in satisfactory condition to undergo the                            procedure.                           After obtaining informed consent, the endoscope was                            passed under direct vision. Throughout the                            procedure, the patient's  blood pressure, pulse, and                            oxygen saturations were monitored continuously. The                            [id number]-[id number] ([id number]) Olympus endoscope was introduced                            through the mouth, and advanced to the third part                            of duodenum. The upper GI endoscopy was                            accomplished without difficulty. The patient                            tolerated the procedure well. Scope In: Scope Out: Findings:      The examined portions of the nasopharynx, oropharynx and larynx were       normal.      The examined esophagus was normal.      A few dispersed diminutive erosions with no stigmata of recent bleeding       were found in the gastric antrum. Biopsies were taken with a cold       forceps for Helicobacter pylori testing. Estimated blood loss was       minimal.      The exam of the stomach was otherwise normal.      The exam was otherwise without abnormality.      Biopsies were taken with a cold forceps in the gastric body for       histology. Estimated blood loss was minimal.      The examined duodenum was normal. Biopsies for histology were taken with       a cold forceps for evaluation of celiac disease. Estimated blood loss       was minimal. Impression:               - The examined portions of the nasopharynx,  oropharynx and larynx were normal.                           - Normal esophagus.                           - Erosive gastropathy with no stigmata of recent                            bleeding. Biopsied.                           - The examination was otherwise normal.                           - Normal examined duodenum. Biopsied.                           - Biopsies were taken with a cold forceps for                            histology in the gastric body.                           - No endoscopic abnormalities to explain IDA. Recommendation:           - Return  patient to hospital ward for ongoing care.                           - Resume previous diet.                           - Continue present medications.                           - Await pathology results.                           - Proceed with colonoscopy. Procedure Code(s):        --- Professional ---                           [phone], Esophagogastroduodenoscopy, flexible,                            transoral; with biopsy, single or multiple Diagnosis Code(s):        --- Professional ---                           K31.89, Other diseases of stomach and duodenum                           D50.9, Iron deficiency anemia, unspecified CPT copyright 2019 [employer]. All rights reserved. The codes documented in this report are preliminary and upon coder review may  be revised to meet current compliance requirements. Upton Russey E. Candis Schatz, MD 11/22/2021 3:22:15 PM This report has been signed electronically. Number of Addenda: 0

## 2021-11-22 NOTE — Anesthesia Postprocedure Evaluation (Signed)
Anesthesia Post Note  Patient: Nicole [name]  Procedure(s) Performed: ESOPHAGOGASTRODUODENOSCOPY (EGD) WITH PROPOFOL COLONOSCOPY BIOPSY     Patient location during evaluation: PACU Anesthesia Type: MAC Level of consciousness: awake and alert Pain management: pain level controlled Vital Signs Assessment: post-procedure vital signs reviewed and stable Respiratory status: spontaneous breathing, nonlabored ventilation, respiratory function stable and patient connected to nasal cannula oxygen Cardiovascular status: stable and blood pressure returned to baseline Postop Assessment: no apparent nausea or vomiting Anesthetic complications: no   No notable events documented.  Last Vitals:  Vitals:   11/22/21 1533 11/22/21 1600  BP: (!) 141/79 (!) 144/76  Pulse: 74 72  Resp: 16 16  Temp: (!) 36.2 C 36.6 C  SpO2: 98% 99%    Last Pain:  Vitals:   11/22/21 1600  TempSrc: Oral  PainSc:                  [name]

## 2021-11-22 NOTE — Transfer of Care (Signed)
Immediate Anesthesia Transfer of Care Note  Patient: Nicole [name]  Procedure(s) Performed: ESOPHAGOGASTRODUODENOSCOPY (EGD) WITH PROPOFOL COLONOSCOPY BIOPSY  Patient Location: PACU  Anesthesia Type:MAC  Level of Consciousness: awake, alert  and oriented  Airway & Oxygen Therapy: Patient Spontanous Breathing  Post-op Assessment: Report given to RN and Post -op Vital signs reviewed and stable  Post vital signs: Reviewed and stable  Last Vitals:  Vitals Value Taken Time  BP 121/69 11/22/21 1518  Temp 36.2 C 11/22/21 1518  Pulse 77 11/22/21 1522  Resp 20 11/22/21 1522  SpO2 98 % 11/22/21 1522  Vitals shown include unvalidated device data.  Last Pain:  Vitals:   11/22/21 1518  TempSrc:   PainSc: 0-No pain         Complications: No notable events documented.

## 2021-11-22 NOTE — Op Note (Signed)
[facility] Patient Name: Nicole [name] Procedure Date : 11/22/2021 MRN: [id number] Attending MD: Gladstone Pih. Candis Schatz , MD Date of Birth: [birth year] CSN: [id number] Age: 71 Admit Type: Inpatient Procedure:                Colonoscopy Indications:              Iron deficiency anemia Providers:                Nicki Reaper E. Candis Schatz, MD, Burtis Junes, RN, Mikey College, RN, Gloris Ham, Technician Referring MD:              Medicines:                Monitored Anesthesia Care Complications:            No immediate complications. Estimated Blood Loss:     Estimated blood loss was minimal. Procedure:                Pre-Anesthesia Assessment:                           - Prior to the procedure, a History and Physical                            was performed, and patient medications and                            allergies were reviewed. The patient's tolerance of                            previous anesthesia was also reviewed. The risks                            and benefits of the procedure and the sedation                            options and risks were discussed with the patient.                            All questions were answered, and informed consent                            was obtained. Prior Anticoagulants: The patient has                            taken Eliquis (apixaban), last dose was 2 days                            prior to procedure. ASA Grade Assessment: III - A                            patient with severe systemic disease. After  reviewing the risks and benefits, the patient was                            deemed in satisfactory condition to undergo the                            procedure.                           After obtaining informed consent, the colonoscope                            was passed under direct vision. Throughout the                            procedure, the patient's blood  pressure, pulse, and                            oxygen saturations were monitored continuously. The                            PCF-HQ190L ([id number]) Olympus colonoscope was                            introduced through the anus and advanced to the the                            cecum, identified by appendiceal orifice and                            ileocecal valve. The colonoscopy was performed                            without difficulty. The patient tolerated the                            procedure well. The quality of the bowel                            preparation was fair. The ileocecal valve,                            appendiceal orifice, and rectum were photographed. Scope In: 2:52:41 PM Scope Out: 3:09:32 PM Scope Withdrawal Time: 0 hours 13 minutes 20 seconds  Total Procedure Duration: 0 hours 16 minutes 51 seconds  Findings:      The perianal and digital rectal examinations were normal. Pertinent       negatives include normal sphincter tone and no palpable rectal lesions.      A 7 mm polyp was found in the ascending colon. The polyp was sessile.       The polyp was removed with a cold snare. Resection and retrieval were       complete. Estimated blood loss was minimal.      The exam was otherwise normal throughout the examined colon.      The retroflexed  view of the distal rectum and anal verge was normal and       showed no anal or rectal abnormalities. Impression:               - Preparation of the colon was fair.                           - One 7 mm polyp in the ascending colon, removed                            with a cold snare. Resected and retrieved.                           - The distal rectum and anal verge are normal on                            retroflexion view.                           - No abnormalities to explain iron deficiency                            anemia on upper or lower endoscopy Recommendation:           - Return patient to hospital ward for  ongoing care.                           - Resume previous diet (following VCE protocol).                           - Continue present medications.                           - To visualize the small bowel, perform video                            capsule endoscopy today. Procedure Code(s):        --- Professional ---                           [id number], Colonoscopy, flexible; with removal of                            tumor(s), polyp(s), or other lesion(s) by snare                            technique Diagnosis Code(s):        --- Professional ---                           K63.5, Polyp of colon                           D50.9, Iron deficiency anemia, unspecified CPT copyright 2019 [employer]. All rights reserved. The codes documented in this report are preliminary and upon coder review may  be revised to meet  current compliance requirements. [name], MD 11/22/2021 3:27:58 PM This report has been signed electronically. Number of Addenda: 0

## 2021-11-23 DIAGNOSIS — R2243 Localized swelling, mass and lump, lower limb, bilateral: Secondary | ICD-10-CM | POA: Diagnosis not present

## 2021-11-23 DIAGNOSIS — E876 Hypokalemia: Secondary | ICD-10-CM | POA: Diagnosis not present

## 2021-11-23 DIAGNOSIS — D649 Anemia, unspecified: Secondary | ICD-10-CM | POA: Diagnosis not present

## 2021-11-23 LAB — BASIC METABOLIC PANEL
Anion gap: 11 (ref 5–15)
Anion gap: 9 (ref 5–15)
BUN: 7 mg/dL — ABNORMAL LOW (ref 8–23)
BUN: 10 mg/dL (ref 8–23)
CO2: 21 mmol/L — ABNORMAL LOW (ref 22–32)
CO2: 21 mmol/L — ABNORMAL LOW (ref 22–32)
Calcium: 9.2 mg/dL (ref 8.9–10.3)
Calcium: 9.4 mg/dL (ref 8.9–10.3)
Chloride: 106 mmol/L (ref 98–111)
Chloride: 110 mmol/L (ref 98–111)
Creatinine, Ser: 0.9 mg/dL (ref 0.44–1.00)
Creatinine, Ser: 0.92 mg/dL (ref 0.44–1.00)
GFR, Estimated: >60 mL/min (ref >60)
GFR, Estimated: >60 mL/min (ref >60)
Glucose, Bld: 115 mg/dL — ABNORMAL HIGH (ref 70–99)
Glucose, Bld: 102 mg/dL — ABNORMAL HIGH (ref 70–99)
Potassium: 2.9 mmol/L — ABNORMAL LOW (ref 3.5–5.1)
Potassium: 3.9 mmol/L (ref 3.5–5.1)
Sodium: 138 mmol/L (ref 135–145)
Sodium: 140 mmol/L (ref 135–145)

## 2021-11-23 LAB — HEMOGLOBIN AND HEMATOCRIT, BLOOD
HCT: 30.7 % — ABNORMAL LOW (ref 36.0–46.0)
Hemoglobin: 9.5 g/dL — ABNORMAL LOW (ref 12.0–15.0)

## 2021-11-23 LAB — MAGNESIUM: Magnesium: 2.2 mg/dL (ref 1.7–2.4)

## 2021-11-23 MED ORDER — POTASSIUM CHLORIDE 10 MEQ/100ML IV SOLN
10.0000 meq | INTRAVENOUS | Status: AC
  Administered 2021-11-23 (×3): 10 meq via INTRAVENOUS
  Filled 2021-11-23 (×2): qty 100

## 2021-11-23 MED ORDER — DOFETILIDE 500 MCG PO CAPS
500.0000 ug | ORAL_CAPSULE | Freq: Two times a day (BID) | ORAL | Status: DC
  Administered 2021-11-23 – 2021-11-24 (×2): 500 ug via ORAL
  Filled 2021-11-23 (×2): qty 1

## 2021-11-23 MED ORDER — POTASSIUM CHLORIDE 20 MEQ PO PACK
40.0000 meq | PACK | Freq: Once | ORAL | Status: AC
  Administered 2021-11-23: 40 meq via ORAL
  Filled 2021-11-23: qty 2

## 2021-11-23 MED ORDER — POTASSIUM CHLORIDE 20 MEQ PO PACK
40.0000 meq | PACK | ORAL | Status: AC
  Administered 2021-11-23: 40 meq via ORAL
  Filled 2021-11-23: qty 2

## 2021-11-23 MED ORDER — APIXABAN 5 MG PO TABS
5.0000 mg | ORAL_TABLET | Freq: Two times a day (BID) | ORAL | Status: DC
  Administered 2021-11-23 – 2021-11-24 (×2): 5 mg via ORAL
  Filled 2021-11-23 (×2): qty 1

## 2021-11-23 MED ORDER — POTASSIUM CHLORIDE 20 MEQ PO PACK
40.0000 meq | PACK | Freq: Two times a day (BID) | ORAL | Status: DC
  Administered 2021-11-23: 40 meq via ORAL
  Filled 2021-11-23: qty 2

## 2021-11-23 NOTE — Progress Notes (Signed)
Subjective:  Patient is moving around well and states that she feels better overall.  She denies abdominal pain.  She states that her lower extremity swelling has improved and that she has been peeing a lot with her Lasix.  Discussed results of her colonoscopy and that we were awaiting the results of her pill endoscopy.  Discussed a plan to speak with GI to see if patient is safe to restart Eliquis.  Discussed potentially going home today.  Objective:  Vital signs in last 24 hours: Vitals:   11/22/21 2010 11/23/21 0442 11/23/21 0834 11/23/21 0848  BP: 118/63 134/70  130/80  Pulse: 72 78  88  Resp: '20 20  19  '$ Temp: 98.9 F (37.2 C) 98.4 F (36.9 C)  98.5 F (36.9 C)  TempSrc: Oral Oral  Oral  SpO2: 100% 97% 98% 95%  Weight:      Height:       Physical Exam: Constitutional:      General: She is not in acute distress. Cardiovascular:     Rate and Rhythm: Normal rate and regular rhythm.    General: no murmurs, rubs, or gallops.  Pulmonary:     Effort: Pulmonary effort is normal.        General: No respiratory distress. No wheezes, rales, rhonchi.  Abdominal:     General: There is no distension. Normal bowel sounds.     Tenderness: There is no abdominal tenderness.  Skin:     General: Skin is warm and dry.  Musculoskeletal:     General: 2+ non-pitting edema of bilateral LE. Good posterior tibial pulses. Neurological:     Mental Status: She is alert. Mental status is at baseline.   Assessment/Plan:  Principal Problem:   Symptomatic anemia Active Problems:   Melena   Iron deficiency anemia due to chronic blood loss  Nicole [name] is a 71 y/o female with history GI bleed (2018), chronic anemia, A-fib/RVR on chronic Eliquis, s/p internal loop recorder (2019), and migraines that presented with 3 months of generalized weakness and was admitted for suspected GI bleed.  Symptomatic anemia with suspected GI bleed Patient on long-term Eliquis, likely contributed to her  presentation.  Hemoglobin improved to 9.5 today. Continues to be hemodynamically stable.  EGD and colonoscopy were reassuring and did not identify a cause of her iron deficiency anemia.  Awaiting results of pill endoscopy.   Patient was considerably upset about having to wait for the results of her pill endoscopy in order to be restarted on Eliquis and discharged safely. Patient is eating well and ready to go home. Awaiting recommendation from GI to restart the patient's Eliquis.  - Continue holding Eliquis  - Will transition diet as tolerated  2. Hypokalemia Potassium of 2.9 this morning.  Likely secondary to bowel prep yesterday.  Will replete with oral and IV potassium.  We will hold Tikosyn until potassium is normalized. - Ordered magnesium level - Ordered EKG  3. Bilateral LE swelling Continuing home lasix (20 mg daily). - Ordered BMP    4. Atrial fibrillation Continues to not be in A-fib.  Continues to not be tachycardic. Eliquis held for potential GI bleed.  Continuing home medications: - Diltiazem (120 mg daily) - Dofetilide (500 mcg, 2X daily)   5. Asthma Continuing home medications:  - Dulera (2 puffs, 2X daily) - Albuterol (2.5 mg, inhalation, q6 PRN)   6. Esophageal Reflux Continuing IV Protonix (40 mg, 1X daily) for potential GI bleed.    7. Hyperlipidemia Continuing  home medications:  - Pravastatin ('40mg'$ , 1X daily)   8. Anxiety Continuing home medications: - Sertraline ('50mg'$ , 1X daily) - Clonazepam ('1mg'$ , 3X daily)     Diet: clear liquid DVT: none IVF: 0.9% sodium chloride Bowel: none Code: full code  Prior to Admission Living Arrangement: home with husband Anticipated Discharge Location: home with husband Barriers to Discharge: continued management Dispo: Anticipated discharge in approximately less than day(s).   Starlyn Skeans, MD 11/23/2021, 3:22 PM Pager: [phone] After 5pm on weekdays and 1pm on weekends: On Call pager [phone]

## 2021-11-23 NOTE — Discharge Summary (Addendum)
Name: Nicole [name] MRN: [id number] DOB: April 26, 1950 71 y.o. PCP: Harvie Junior, MD  Date of Admission: 11/20/2021  1:13 PM Date of Discharge: 11/24/2021 Attending Physician: Velna Ochs, MD  Discharge Diagnosis: 1. Principal Problem:   Symptomatic anemia Active Problems:   Melena   Iron deficiency anemia due to chronic blood loss Lower extremity edema  Discharge Medications: Allergies as of 11/24/2021       Reactions   Demerol [meperidine Hcl]    migraine   Pneumococcal Vaccines Other (See Comments)   Went into A-Fib immediately after receiving this    Shingrix [zoster Vac Recomb Adjuvanted] Other (See Comments)   Went into A-Fib immediately after receiving this    Other Other (See Comments)   Anesthesia- Takes a lot to anesthetize the patient   Meperidine Nausea And Vomiting, Other (See Comments)   States she "About passed out," also   Prednisone Palpitations, Other (See Comments)   Tachycardia         Medication List     TAKE these medications    acetaminophen 500 MG tablet Commonly known as: TYLENOL Take 500 mg by mouth every 6 (six) hours as needed for mild pain or headache.   butalbital-acetaminophen-caffeine 50-325-40 MG tablet Commonly known as: FIORICET Take 1 tablet by mouth every 8 (eight) hours as needed for headache or migraine.   butorphanol 10 MG/ML nasal spray Commonly known as: STADOL Place 1 spray into the nose every 4 (four) hours as needed for headache or migraine.   cetirizine 10 MG tablet Commonly known as: ZYRTEC Take 10 mg by mouth daily as needed for allergies or rhinitis.   clonazePAM 1 MG tablet Commonly known as: KLONOPIN Take 1 mg by mouth 3 (three) times daily.   diltiazem 120 MG 24 hr capsule Commonly known as: CARDIZEM CD Take 1 capsule (120 mg total) by mouth daily.   diltiazem 30 MG tablet Commonly known as: CARDIZEM Take 1 Tablet Every 4 Hours As Needed For HR >100   dofetilide 500 MCG capsule Commonly  known as: TIKOSYN Take 1 capsule (500 mcg total) by mouth 2 (two) times daily.   Eliquis 5 MG Tabs tablet Generic drug: apixaban TAKE 1 TABLET BY MOUTH TWICE A DAY What changed: how much to take   fluticasone-salmeterol 115-21 MCG/ACT inhaler Commonly known as: ADVAIR HFA Inhale 2 puffs into the lungs 2 (two) times daily.   furosemide 20 MG tablet Commonly known as: LASIX TAKE 1 TABLET BY MOUTH EVERY DAY AS NEEDED FOR EDEMA What changed: See the new instructions.   Klor-Con M20 20 MEQ tablet Generic drug: potassium chloride SA TAKE 1 TABLET BY MOUTH EVERY DAY What changed: how much to take   pravastatin 40 MG tablet Commonly known as: PRAVACHOL Take 40 mg by mouth at bedtime.   promethazine 25 MG tablet Commonly known as: PHENERGAN Take 25 mg by mouth every 6 (six) hours as needed for nausea or vomiting (from migraines).   sertraline 50 MG tablet Commonly known as: ZOLOFT Take 50 mg by mouth at bedtime.   Ventolin HFA 108 (90 Base) MCG/ACT inhaler Generic drug: albuterol Inhale 2 puffs into the lungs every 6 (six) hours as needed for wheezing or shortness of breath.        Disposition and follow-up:   Nicole [name] was discharged from [facility] in Good condition.  At the hospital follow up visit please address:  1.  Symptomatic anemia with suspected GI Bleed - Discuss positive FOBT -  Discuss colonoscopy/EGD/pill endoscopy results (biopsy results)  2.  Labs / imaging needed at time of follow-up: CBC  3.  Pending labs/ test needing follow-up: none  Follow-up Appointments: -Please follow-up with your primary care provider Dr. Jimmye Norman on 11/28/2021.   Hospital Course by problem list: Nicole [name] is a 71 y/o female with history GI bleed (2018), chronic anemia, A-fib/RVR on chronic Eliquis, s/p internal loop recorder (2019), and migraines that presented with 3 months of generalized weakness and admitted for suspected GI bleed.    Symptomatic anemia with suspected GI bleed Patient on long-term Eliquis and likely contributed to her presentation.  Hemoglobin of 5 on admission.  Hemoglobin improved to 8.9 on day of discharge.  Patient was hemodynamically stable throughout her hospital course.  Patient was given 2 units of PRBCs as well as Feraheme.  Patient underwent EGD/pill endoscopy and colonoscopy that did not identify a cause of her iron deficiency anemia. Eliquis was held initially due to suspected GI bleed.  Diet was transition as tolerated.  Dr. Lyndel Safe updated the patient on the results of her pill endoscopy on the afternoon of 11/23/2021 noting two areas of nonactive bleeding in her small intestine that could explain her positive FOBT.  Dr. Lyndel Safe asked the patient if she would prefer to restart her Eliquis now and if she develops a bleed in the future then they would intervene with cauterization or if she would prefer to stay here in the hospital to have these two areas cauterized in the next day or two.  The patient stated that she would like to restart her Eliquis today and receive cauterization at a later date if necessary. Eliquis was restarted after this conversation on 11/23/2021. Dr. Lyndel Safe also stated that the patient could be transition to a normal diet.    2. Bilateral LE swelling Patient was treated with her home Lasix (20 mg daily).  3. Hypokalemia Likely secondary to Lasix. Replenished with oral and IV potassium.  Tikosyn initially held due to hypokalemia but restarted after potassium normalized.   4. Atrial fibrillation No episodes of A-fib during her hospitalization.  No tachycardia.  Eliquis was held for potential GI bleed.  Continued home medications Diltiazem (120 mg daily) and Dofetilide (500 mcg, 2X daily).   5. Asthma Continued home medications Dulera (2 puffs, 2X daily) and Albuterol (2.5 mg, inhalation, q6 PRN).   6. Esophageal Reflux Gave IV Protonix (40 mg, 1X daily) for potential GI bleed.    7.  Hyperlipidemia Continued home medication Pravastatin ('40mg'$ , 1X daily).   8. Anxiety Continued home medications Sertraline ('50mg'$ , 1X daily) and Clonazepam ('1mg'$ , 3X daily)   Discharge Exam:   BP 130/80   Pulse 88   Temp 98.5 F (36.9 C) (Oral)   Resp 19   Ht '5\' 8"'$  (1.727 m)   Wt 98.9 kg   SpO2 95%   BMI 33.15 kg/m   Constitutional:      General: She is not in acute distress. Cardiovascular:     Rate and Rhythm: Normal rate and regular rhythm.    General: no murmurs, rubs, or gallops.  Pulmonary:     Effort: Pulmonary effort is normal.        General: No respiratory distress. No wheezes, rales, rhonchi.  Abdominal:     General: There is no distension. Normal bowel sounds.     Tenderness: There is no abdominal tenderness.  Skin:     General: Skin is warm and dry.  Musculoskeletal:     General:  2+ non-pitting edema of bilateral LE. Good posterior tibial pulses. Neurological:     Mental Status: She is alert. Mental status is at baseline.   Pertinent Labs, Studies, and Procedures:      Latest Ref Rng & Units 11/23/2021    9:13 AM 11/22/2021    4:52 PM 11/22/2021    4:37 AM  CBC  WBC 4.0 - 10.5 K/uL  6.7  8.8   Hemoglobin 12.0 - 15.0 g/dL 9.5  8.9  8.7   Hematocrit 36.0 - 46.0 % 30.7  29.8  27.4   Platelets 150 - 400 K/uL  251  236       Latest Ref Rng & Units 11/24/2021    3:49 AM 11/23/2021    9:18 PM 11/23/2021    9:13 AM  BMP  Glucose 70 - 99 mg/dL 96  102  115   BUN 8 - 23 mg/dL '11  10  7   '$ Creatinine 0.44 - 1.00 mg/dL 0.83  0.92  0.90   Sodium 135 - 145 mmol/L 137  140  138   Potassium 3.5 - 5.1 mmol/L 4.1  3.9  2.9   Chloride 98 - 111 mmol/L 110  110  106   CO2 22 - 32 mmol/L '20  21  21   '$ Calcium 8.9 - 10.3 mg/dL 9.2  9.4  9.2     Colonoscopy  Impression: -Preparation of the colon was fair. - One 7 mm polyp in the ascending colon, removed with a cold snare. Resected and retrieved. - The distal rectum and anal verge are normal on retroflexion view. - No  abnormalities to explain iron deficiency anemia on upper or lower endoscopy  Recommendation: - Return patient to hospital ward for ongoing care. - Resume previous diet (following VCE protocol). - Continue present medications. - To visualize the small bowel, perform video capsule endoscopy today.  Upper Endoscopy  Impression: - The examined portions of the nasopharynx, oropharynx and larynx were normal. - Normal esophagus. - Erosive gastropathy with no stigmata of recent bleeding. Biopsied. - The examination was otherwise normal. - Normal examined duodenum. Biopsied. - Biopsies were taken with a cold forceps for histology in the gastric body. - No endoscopic abnormalities to explain IDA.  Recommendation: - Return patient to hospital ward for ongoing care. - Resume previous diet. - Continue present medications. - Await pathology results. - Proceed with colonoscopy.    Discharge Instructions:  You were hospitalized for weakness that was likely caused by your anemia.   Hospital course: -We treated your anemia by giving you more blood and iron.  We did not give you your Eliquis while you were hospitalized in case we found a bleed.  We looked at your bowel and stomach with cameras to look for bleed but did not find one.  We gave you your home dose of lasix to help with your leg swelling.   Medications:   Please continue to take: -Acetaminophen (Tylenol) 500 mg tablet (by mouth every 6 hours as needed) for mild pain or headache. -Butalbital-acetaminophen-caffeine (Fioricet) 50-325-40 MG (1 tablet by mouth every 8 hours as needed) for headache or migraine. -Butorphanol (Stadol) 10 mg/ML nasal spray (1 spray into the nose every 4 hours as needed) for headache or migraine. -Cetirizine (Zyrtec) 10 mg tablet (1 by mouth daily as needed) for allergies. -Clonazepam (Klonopin) 1 mg tablet (by mouth 3 times daily) for anxiety. -Diltiazem (Cardizem CD) 120 mg 24-hour capsule (1 capsule by  mouth daily) for your atrial  fibrillation.  -Diltiazem (Cardizem) 30 mg tablet (1 tablet every 4 hours as needed for heart rate greater than 100) for your atrial fibrillation. -Dofetilide (Tikosyn) 500 mcg capsule (1 capsule by mouth twice daily) for your atrial fibrillation.  -Eliquis 5 mg tablet (1 by mouth twice daily) for your atrial fibrillation. -Fluticasone-salmeterol (Advair HFA) 115-21 mcg/ACT inhaler (inhale 2 puffs into the lungs twice daily) for your asthma. -Furosemide (Lasix) 20 mg tablet (120 mg daily as needed) for leg swelling. -Klor-Con M20 20 MEQ tablet (1 tablet by mouth daily) for low potassium. -Pravastatin (Pravachol) 40 mg tablet (1 by mouth at bedtime) for your high lipids. -Promethazine (Phenergan) 25 mg tablet (once by mouth every 6 hours as needed) for nausea or vomiting. -Sertraline (Zoloft) 50 mg tablet (1 tablet by mouth at bedtime) for sleep. -Ventolin HFA 108 (90 base) MCG/ACT inhaler (inhale 2 puffs in the lungs every 6 hours as needed) for wheezing or shortness of breath.   Follow-up: -Please follow-up with your primary care provider Dr. Jimmye Norman on 11/28/2021.   Signed: Starlyn Skeans, MD 11/23/2021, 3:08 PM   Pager: [phone]

## 2021-11-23 NOTE — Evaluation (Signed)
Physical Therapy Evaluation Patient Details Name: Nicole [name] MRN: [id number] DOB: [birth year] Today's Date: 11/23/2021  History of Present Illness  Pt is a 71 y/o female admitted secondary to generalized weakness with low Hgb, suspected GIB. Pt now s/p EGD and colonoscopy on 8/3. PMH including but not limited to GI bleed (2018), chronic anemia, A-fib/RVR on chronic Eliquis, s/p internal loop recorder (2019), and migraines.   Clinical Impression  Pt presented supine in bed with HOB elevated, awake and willing to participate in therapy session. Prior to admission, pt reported that she was independent with all functional mobility and ADLs. Pt lives with her spouse in a two level home (able to live on main level) and four steps to enter. At the time of evaluation, pt able to complete bed mobility and transfers at a modified independent level. She was also able to ambulate in the hallway with min guard to supervision level from PT for safety. She demonstrated some mild instability with higher level balance tasks but no overt LOB throughout. PT will continue to f/u with pt acutely to progress mobility as tolerated and to ensure a safe d/c home when medically appropriate.        Recommendations for follow up therapy are one component of a multi-disciplinary discharge planning process, led by the attending physician.  Recommendations may be updated based on patient status, additional functional criteria and insurance authorization.  Follow Up Recommendations No PT follow up      Assistance Recommended at Discharge PRN  Patient can return home with the following       Equipment Recommendations None recommended by PT  Recommendations for Other Services       Functional Status Assessment Patient has had a recent decline in their functional status and demonstrates the ability to make significant improvements in function in a reasonable and predictable amount of time.     Precautions / Restrictions  Precautions Precautions: Fall Restrictions Weight Bearing Restrictions: No      Mobility  Bed Mobility Overal bed mobility: Modified Independent                  Transfers Overall transfer level: Modified independent Equipment used: None                    Ambulation/Gait Ambulation/Gait assistance: Min guard, Supervision Gait Distance (Feet): 200 Feet Assistive device: None Gait Pattern/deviations: Step-through pattern Gait velocity: decreased     General Gait Details: pt with mild instability (more notable with higher level balance tasks) but no overt LOB or need for external physical assistance  Stairs            Wheelchair Mobility    Modified Rankin (Stroke Patients Only)       Balance Overall balance assessment: Needs assistance Sitting-balance support: Feet supported Sitting balance-Leahy Scale: Good     Standing balance support: During functional activity, No upper extremity supported Standing balance-Leahy Scale: Fair               High level balance activites: Direction changes, Turns, Head turns High Level Balance Comments: mild instability noted but no overt LOB or need for physical assistance             Pertinent Vitals/Pain Pain Assessment Pain Assessment: No/denies pain    Home Living Family/patient expects to be discharged to:: Private residence Living Arrangements: Spouse/significant other Available Help at Discharge: Family;Available PRN/intermittently Type of Home: House Home Access: Stairs to enter Entrance Stairs-Rails: None Entrance  Stairs-Number of Steps: 4   Home Layout: Able to live on main level with bedroom/bathroom Home Equipment: Shower seat      Prior Function Prior Level of Function : Independent/Modified Independent;Driving                     Hand Dominance        Extremity/Trunk Assessment   Upper Extremity Assessment Upper Extremity Assessment: Overall WFL for tasks  assessed    Lower Extremity Assessment Lower Extremity Assessment: Overall WFL for tasks assessed    Cervical / Trunk Assessment Cervical / Trunk Assessment: Normal  Communication   Communication: No difficulties  Cognition Arousal/Alertness: Awake/alert Behavior During Therapy: WFL for tasks assessed/performed Overall Cognitive Status: Within Functional Limits for tasks assessed                                          General Comments      Exercises     Assessment/Plan    PT Assessment Patient needs continued PT services  PT Problem List Decreased balance;Decreased mobility       PT Treatment Interventions DME instruction;Gait training;Stair training;Functional mobility training;Therapeutic activities;Therapeutic exercise;Balance training;Neuromuscular re-education;Patient/family education    PT Goals (Current goals can be found in the Care Plan section)  Acute Rehab PT Goals Patient Stated Goal: to return to her normal level of independence PT Goal Formulation: With patient Time For Goal Achievement: 12/07/21 Potential to Achieve Goals: Good    Frequency Min 3X/week     Co-evaluation               AM-PAC PT "6 Clicks" Mobility  Outcome Measure Help needed turning from your back to your side while in a flat bed without using bedrails?: None Help needed moving from lying on your back to sitting on the side of a flat bed without using bedrails?: None Help needed moving to and from a bed to a chair (including a wheelchair)?: None Help needed standing up from a chair using your arms (e.g., wheelchair or bedside chair)?: None Help needed to walk in hospital room?: None Help needed climbing 3-5 steps with a railing? : A Little 6 Click Score: 23    End of Session   Activity Tolerance: Patient tolerated treatment well Patient left: in chair;with call bell/phone within reach Nurse Communication: Mobility status PT Visit Diagnosis: Other  abnormalities of gait and mobility (R26.89)    Time: 5009-3818 PT Time Calculation (min) (ACUTE ONLY): 24 min   Charges:   PT Evaluation $PT Eval Low Complexity: 1 Low PT Treatments $Gait Training: 8-22 mins        Anastasio Champion, DPT  Acute Rehabilitation Services [facility] [place] 11/23/2021, 9:25 AM

## 2021-11-23 NOTE — Discharge Instructions (Addendum)
You were hospitalized for weakness that was likely caused by your anemia.  Hospital course: -We treated your anemia by giving you more blood and iron.  We did not give you your Eliquis while you were hospitalized in case we found a bleed.  We looked at your bowel and stomach with cameras to look for bleed but did not find one.  We gave you your home dose of lasix to help with your leg swelling.  Medications:  Please continue to take: -Acetaminophen (Tylenol) 500 mg tablet (by mouth every 6 hours as needed) for mild pain or headache. -Butalbital-acetaminophen-caffeine (Fioricet) 50-325-40 MG (1 tablet by mouth every 8 hours as needed) for headache or migraine. -Butorphanol (Stadol) 10 mg/ML nasal spray (1 spray into the nose every 4 hours as needed) for headache or migraine. -Cetirizine (Zyrtec) 10 mg tablet (1 by mouth daily as needed) for allergies. -Clonazepam (Klonopin) 1 mg tablet (by mouth 3 times daily) for anxiety. -Diltiazem (Cardizem CD) 120 mg 24-hour capsule (1 capsule by mouth daily) for your atrial fibrillation.  -Diltiazem (Cardizem) 30 mg tablet (1 tablet every 4 hours as needed for heart rate greater than 100) for your atrial fibrillation. -Dofetilide (Tikosyn) 500 mcg capsule (1 capsule by mouth twice daily) for your atrial fibrillation.  -Eliquis 5 mg tablet (1 by mouth twice daily) for your atrial fibrillation. -Fluticasone-salmeterol (Advair HFA) 115-21 mcg/ACT inhaler (inhale 2 puffs into the lungs twice daily) for your asthma. -Furosemide (Lasix) 20 mg tablet (120 mg daily as needed) for leg swelling. -Klor-Con M20 20 MEQ tablet (1 tablet by mouth daily) for low potassium. -Pravastatin (Pravachol) 40 mg tablet (1 by mouth at bedtime) for your high lipids. -Promethazine (Phenergan) 25 mg tablet (once by mouth every 6 hours as needed) for nausea or vomiting. -Sertraline (Zoloft) 50 mg tablet (1 tablet by mouth at bedtime) for sleep. -Ventolin HFA 108 (90 base) MCG/ACT  inhaler (inhale 2 puffs in the lungs every 6 hours as needed) for wheezing or shortness of breath.  Follow-up: -Please follow-up with your primary care provider Dr. Jimmye Norman on 11/28/2021.

## 2021-11-23 NOTE — Progress Notes (Addendum)
[facility] Progress Note  CC: Iron deficiency anemia  Subjective: She is tolerating a solid diet.  No nausea or vomiting.  No abdominal pain.  She passed 2 nonbloody watery brown stools earlier today.  She is anxiously awaiting the results of the small bowel capsule endoscopy.  She is concerned about being off Eliquis for the 3 days with history of atrial fibrillation.  EKG technician arrived to obtain a twelve-lead EKG.  No chest pain, palpitations or shortness of breath.  Her husband is at the bedside.  Objective:  Vital signs in last 24 hours: Temp:  [97.2 F (36.2 C)-98.9 F (37.2 C)] 98.5 F (36.9 C) (08/04 0848) Pulse Rate:  [72-88] 88 (08/04 0848) Resp:  [16-20] 19 (08/04 0848) BP: (118-144)/(63-80) 130/80 (08/04 0848) SpO2:  [95 %-100 %] 95 % (08/04 0848) Last BM Date : 11/23/21 General: 71 year old female in no acute distress, sitting up in the chair. Heart: Regular rhythm, no murmurs Pulm: Sounds clear throughout Abdomen: Soft, nontender.  Positive bowel sounds to all 4 quadrants. Extremities:  Without edema. Neurologic:  Alert and  oriented x 4. Grossly normal neurologically. Psych:  Alert and cooperative. Normal mood and affect.  Intake/Output from previous day: 08/03 0701 - 08/04 0700 In: 455.2 [P.O.:120; I.V.:335.2] Out: 0  Intake/Output this shift: Total I/O In: 460 [P.O.:460] Out: -   Lab Results: Recent Labs    11/21/21 1632 11/22/21 0437 11/22/21 1652 11/23/21 0913  WBC 6.4 8.8 6.7  --   HGB 8.4* 8.7* 8.9* 9.5*  HCT 27.1* 27.4* 29.8* 30.7*  PLT 256 236 251  --    BMET Recent Labs    11/21/21 0357 11/23/21 0913  NA 140 138  K 4.0 2.9*  CL 114* 106  CO2 21* 21*  GLUCOSE 90 115*  BUN 6* 7*  CREATININE 0.70 0.90  CALCIUM 8.4* 9.2   LFT Recent Labs    11/21/21 0357  PROT 6.0*  ALBUMIN 3.0*  AST 17  ALT 8  ALKPHOS 55  BILITOT 0.4   PT/INR No results for input(s): "LABPROT", "INR" in the last 72 hours. Hepatitis  Panel No results for input(s): "HEPBSAG", "HCVAB", "HEPAIGM", "HEPBIGM" in the last 72 hours.  No results found.  Assessment / Plan:  71 year old female with profound symptomatic microcytic anemia.  Previously on iron supplement for IDA several years ago.  Hg 5 transfused 2 PRBCs -> Hg 7.  Low iron 14.  Low ferritin 4.  Low iron sats 3%.  Folate, B12 normal. FOBT positive stool.  Received Feraheme IV x 1.  S/P EGD 8/3 showed normal esophagus, erosive gastropathy without stigmata of recent bleeding. S/P colonoscopy 8/3 showed one 7 mm polyp removed from the ascending colon, preparation was fair.  VCE capsule placed, results pending. Hg stable 8.9 -> 9.5.  No overt GI bleeding.  Hemodynamically stable. -Await VCE results -Await colonoscopy path result -CBC in a.m. -Transfuse as needed -Further recommendations per Dr. 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  Hypokalemia. K+ 2.9.  -KCl replacement per the hospitalist  Principal Problem:   Symptomatic anemia Active Problems:   Melena   Iron deficiency anemia due to chronic blood loss     LOS: 3 days   Noralyn Pick  11/23/2021, 2:51 PM   Attending physician's note   I have  taken history, reviewed the chart and examined the patient. I performed a substantive portion of this encounter, including complete performance of at least one of the key components, in conjunction with the APP. I agree with the Advanced Practitioner's note, impression and recommendations.   IDA with H+ stools. 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  Beyond the reach of conventional push enteroscopy. -Retained  fluid/secretions did limit exam  We have offered push enteroscopy this adm. She would like to go home  Resume Eliquis Monitor CBC as outpt FU GI in 3-4 weeks If any further bleeding, recommend push enteroscopy (if still, may require double-balloon enteroscopy) Avoid nonsteroidals Will sign off for now D/W pt and pt's husband   Carmell Austria, MD [facility] [phone]

## 2021-11-23 NOTE — Care Management Important Message (Signed)
Important Message  Patient Details  Name: Nicole [name] MRN: [id number] Date of Birth: Aug 05, 1950   Medicare Important Message Given:  Yes     Kelcey Korus 11/23/2021, 2:30 PM

## 2021-11-24 ENCOUNTER — Encounter (HOSPITAL_COMMUNITY): Payer: Self-pay | Admitting: Gastroenterology

## 2021-11-24 DIAGNOSIS — R2243 Localized swelling, mass and lump, lower limb, bilateral: Secondary | ICD-10-CM | POA: Diagnosis not present

## 2021-11-24 DIAGNOSIS — D649 Anemia, unspecified: Secondary | ICD-10-CM | POA: Diagnosis not present

## 2021-11-24 DIAGNOSIS — E876 Hypokalemia: Secondary | ICD-10-CM | POA: Diagnosis not present

## 2021-11-24 LAB — BASIC METABOLIC PANEL
Anion gap: 7 (ref 5–15)
BUN: 11 mg/dL (ref 8–23)
CO2: 20 mmol/L — ABNORMAL LOW (ref 22–32)
Calcium: 9.2 mg/dL (ref 8.9–10.3)
Chloride: 110 mmol/L (ref 98–111)
Creatinine, Ser: 0.83 mg/dL (ref 0.44–1.00)
GFR, Estimated: >60 mL/min (ref >60)
Glucose, Bld: 96 mg/dL (ref 70–99)
Potassium: 4.1 mmol/L (ref 3.5–5.1)
Sodium: 137 mmol/L (ref 135–145)

## 2021-11-24 LAB — MAGNESIUM: Magnesium: 2.2 mg/dL (ref 1.7–2.4)

## 2021-11-24 NOTE — Progress Notes (Signed)
DISCHARGE NOTE HOME Nicole [name] to be discharged Home per MD order. Discussed prescriptions and follow up appointments with the patient. Prescriptions given to patient; medication list explained in detail. Patient verbalized understanding.  Skin clean, dry and intact without evidence of skin break down, no evidence of skin tears noted. IV catheter discontinued intact. Site without signs and symptoms of complications. Dressing and pressure applied. Pt denies pain at the site currently. No complaints noted.  Patient free of lines, drains, and wounds.   An After Visit Summary (AVS) was printed and given to the patient. Patient escorted via wheelchair, and discharged home via private auto.  Berneta Levins, RN

## 2021-11-24 NOTE — Progress Notes (Signed)
Discharge instructions (including medications) discussed with and copy provided to patient/caregiver. All belonging sent home with patient.

## 2021-11-26 ENCOUNTER — Ambulatory Visit (HOSPITAL_COMMUNITY): Payer: Medicare Other

## 2021-11-26 ENCOUNTER — Ambulatory Visit: Payer: Self-pay

## 2021-11-26 LAB — SURGICAL PATHOLOGY

## 2021-11-26 NOTE — Patient Outreach (Signed)
  Care Coordination TOC Note Transition Care Management Follow-up Telephone Call Date of discharge and from where: [facility] 11/20/21-11/24/21 How have you been since you were released from the hospital? "I am feeling very good". Any questions or concerns? No  Items Reviewed: Did the pt receive and understand the discharge instructions provided? Yes  Medications obtained and verified? Yes  Other? No  Any new allergies since your discharge? No  Dietary orders reviewed? Yes Do you have support at home? Yes   Home Care and Equipment/Supplies: Were home health services ordered? no If so, what is the name of the agency? N/A  Has the agency set up a time to come to the patient's home? not applicable Were any new equipment or medical supplies ordered?  No What is the name of the medical supply agency? N/A Were you able to get the supplies/equipment? no Do you have any questions related to the use of the equipment or supplies? No  Functional Questionnaire: (I = Independent and D = Dependent) ADLs: I  Bathing/Dressing- I  Meal Prep- I  Eating- I  Maintaining continence- I  Transferring/Ambulation- I  Managing Meds- I  Follow up appointments reviewed:  PCP Hospital f/u appt confirmed? No  Patient noted Dr. Jimmye Norman office making post hospital appt. [facility] f/u appt confirmed? Yes  Scheduled to see Dr. [name] on 12/06/21 @ 0930 after echo. Are transportation arrangements needed? No  If their condition worsens, is the pt aware to call PCP or go to the Emergency Dept.? Yes Was the patient provided with contact information for the PCP's office or ED? Yes Was to pt encouraged to call back with questions or concerns? Yes  SDOH assessments and interventions completed:   Yes  Care Coordination Interventions Activated:  No   Care Coordination Interventions:   No interventions needed at this time.     Encounter Outcome:  Pt. Visit Completed

## 2021-11-28 ENCOUNTER — Other Ambulatory Visit (HOSPITAL_COMMUNITY): Payer: Self-pay | Admitting: Internal Medicine

## 2021-12-05 NOTE — Progress Notes (Signed)
Carelink Summary Report / Loop Recorder 

## 2021-12-06 ENCOUNTER — Ambulatory Visit (HOSPITAL_BASED_OUTPATIENT_CLINIC_OR_DEPARTMENT_OTHER)
Admission: RE | Admit: 2021-12-06 | Discharge: 2021-12-06 | Disposition: A | Discharge status: Home / Self Care | Payer: Medicare Other | Source: Ambulatory Visit | Attending: Physician Assistant | Admitting: Physician Assistant

## 2021-12-06 ENCOUNTER — Ambulatory Visit (HOSPITAL_COMMUNITY)
Admission: RE | Admit: 2021-12-06 | Discharge: 2021-12-06 | Disposition: A | Discharge status: Home / Self Care | Payer: Medicare Other | Source: Ambulatory Visit | Attending: Physician Assistant | Admitting: Physician Assistant

## 2021-12-06 ENCOUNTER — Encounter (HOSPITAL_COMMUNITY): Payer: Self-pay | Admitting: Physician Assistant

## 2021-12-06 VITALS — BP 134/88 | HR 84 | Ht 68.0 in | Wt 215.0 lb

## 2021-12-06 DIAGNOSIS — D6869 Other thrombophilia: Secondary | ICD-10-CM | POA: Diagnosis not present

## 2021-12-06 DIAGNOSIS — Z7901 Long term (current) use of anticoagulants: Secondary | ICD-10-CM | POA: Diagnosis not present

## 2021-12-06 DIAGNOSIS — I480 Paroxysmal atrial fibrillation: Secondary | ICD-10-CM | POA: Diagnosis not present

## 2021-12-06 DIAGNOSIS — I10 Essential (primary) hypertension: Secondary | ICD-10-CM | POA: Insufficient documentation

## 2021-12-06 DIAGNOSIS — R55 Syncope and collapse: Secondary | ICD-10-CM | POA: Diagnosis not present

## 2021-12-06 DIAGNOSIS — D649 Anemia, unspecified: Secondary | ICD-10-CM | POA: Insufficient documentation

## 2021-12-06 LAB — ECHOCARDIOGRAM COMPLETE
Area-P 1/2: 4.39 cm2
S' Lateral: 2.6 cm

## 2021-12-06 NOTE — Progress Notes (Signed)
Primary Care Physician: Harvie Junior, MD Referring Physician: Dr. Rayann Heman Primary EP: Dr Emogene Morgan is a 71 y.o. female with a h/o anemia,  afib, and syncope possibly 2/2 termination pause  that presented to his office with afib with RVR at 160 bpm and was admitted for Tikosyn load. Converted to SR without pause. Qtc remained stable. She is on Eliquis for a CHADS2VASC score of 3. She is s/p repeat afib ablation with Dr Rayann Heman on 03/14/20. Patient had tachypalpitations on 02/27/21 and took one of her PRN diltiazem which did not resolve her symptoms. EMS was called and ECG conformed afib with RVR. She was given IV diltiazem which converted her to SR. Patient does report that he niece has passed away the day before the onset of her symptoms. She has had 3 episodes of afib since the start of 2023 and went to the ED 05/09/21. She spontaneously converted en route. She was seen by Dr Curt Bears 06/18/21 and her BB was increased.   At her visit 11/20/21, patient reported that she had been feeling "terrible". She becomes very SOB with minimal exertion. CBC showed severe anemia with Hgb 5.2, she was sent to the ED for evaluation and transfusion, given 2 units PRBCs. Patient underwent EGD/pill endoscopy and colonoscopy. Eliquis was held initially due to suspected GI bleed. Dr. Lyndel Safe updated the patient on the results of her pill endoscopy on the afternoon of 11/23/2021 noting two areas of nonactive bleeding in her small intestine that could explain her positive FOBT.  On follow up today, patient reports that she felt better almost immediately after her first transfusion. She is doing well today with resolution of her SOB, CP, and fatigue.   Today, she denies symptoms of palpitations, SOB, CP, orthopnea, PND, dizziness, presyncope, syncope, or neurologic sequela.  otherwise without complaint today.   Past Medical History:  Diagnosis Date   Anticoagulant long-term use 11/16/2014   Anxiety 10/06/2017    Arthritis 10/06/2017   knees bilateral   Asthma 11/16/2014   Esophageal reflux 11/16/2014   Essential hypertension 08/15/2015   pt denies   H/O: GI bleed 12/11/2016   Hyperlipidemia 11/17/2014   Migraine headache 11/16/2014   Nausea 11/16/2014   Pain in limb 11/16/2014   Panic disorder 11/16/2014   Paroxysmal atrial fibrillation ([place]) 11/17/2014   Swelling of joint 11/16/2014   Symptomatic anemia 11/24/2016   Tendonitis 11/16/2014   UTI (urinary tract infection) 12/19/2015   Past Surgical History:  Procedure Laterality Date   ABDOMINAL HYSTERECTOMY     ATRIAL FIBRILLATION ABLATION N/A 03/14/2020   Procedure: ATRIAL FIBRILLATION ABLATION;  Surgeon: [name], MD;  Location: [facility];  Service: Cardiovascular;  Laterality: N/A;   BIOPSY  11/22/2021   Procedure: BIOPSY;  Surgeon: Daryel November, MD;  Location: [facility];  Service: Gastroenterology;;   CARDIOVERSION     CESAREAN SECTION     COLONOSCOPY     COLONOSCOPY N/A 11/22/2021   Procedure: COLONOSCOPY;  Surgeon: Daryel November, MD;  Location: [place];  Service: Gastroenterology;  Laterality: N/A;   ESOPHAGOGASTRODUODENOSCOPY (EGD) WITH PROPOFOL N/A 11/22/2021   Procedure: ESOPHAGOGASTRODUODENOSCOPY (EGD) WITH PROPOFOL;  Surgeon: Daryel November, MD;  Location: [place];  Service: Gastroenterology;  Laterality: N/A;   GIVENS CAPSULE STUDY N/A 11/22/2021   Procedure: GIVENS CAPSULE STUDY;  Surgeon: Daryel November, MD;  Location: [place];  Service: Gastroenterology;  Laterality: N/A;   LOOP RECORDER INSERTION N/A 02/04/2018   Procedure: LOOP  RECORDER INSERTION;  Surgeon: [name], MD;  Location: [facility];  Service: Cardiovascular;  Laterality: N/A;   POLYPECTOMY  11/22/2021   Procedure: POLYPECTOMY;  Surgeon: Daryel November, MD;  Location: [facility] ENDOSCOPY;  Service: Gastroenterology;;   ROTATOR CUFF REPAIR Left     Current Outpatient Medications  Medication Sig Dispense Refill    acetaminophen (TYLENOL) 500 MG tablet Take 500 mg by mouth every 6 (six) hours as needed for mild pain or headache.     butalbital-acetaminophen-caffeine (FIORICET) 50-325-40 MG tablet Take 1 tablet by mouth every 8 (eight) hours as needed for headache or migraine.      butorphanol (STADOL) 10 MG/ML nasal spray Place 1 spray into the nose every 4 (four) hours as needed for headache or migraine.      cetirizine (ZYRTEC) 10 MG tablet Take 10 mg by mouth daily as needed for allergies or rhinitis.     clonazePAM (KLONOPIN) 1 MG tablet Take 1 mg by mouth 3 (three) times daily.     diltiazem (CARDIZEM CD) 120 MG 24 hr capsule Take 1 capsule (120 mg total) by mouth daily. 30 capsule 3   diltiazem (CARDIZEM) 30 MG tablet Take 1 Tablet Every 4 Hours As Needed For HR >100 45 tablet 1   dofetilide (TIKOSYN) 500 MCG capsule Take 1 capsule (500 mcg total) by mouth 2 (two) times daily. 180 capsule 2   ELIQUIS 5 MG TABS tablet TAKE 1 TABLET BY MOUTH TWICE A DAY 60 tablet 5   ferrous sulfate 325 (65 FE) MG EC tablet Take 1 tablet by mouth daily.     fluticasone-salmeterol (ADVAIR HFA) 115-21 MCG/ACT inhaler Inhale 2 puffs into the lungs 2 (two) times daily.     furosemide (LASIX) 20 MG tablet TAKE 1 TABLET BY MOUTH EVERY DAY AS NEEDED FOR EDEMA 90 tablet 3   KLOR-CON M20 20 MEQ tablet TAKE 1 TABLET BY MOUTH EVERY DAY 90 tablet 3   pravastatin (PRAVACHOL) 40 MG tablet Take 40 mg by mouth at bedtime.      promethazine (PHENERGAN) 25 MG tablet Take 25 mg by mouth every 6 (six) hours as needed for nausea or vomiting (from migraines).      sertraline (ZOLOFT) 50 MG tablet Take 50 mg by mouth at bedtime.     VENTOLIN HFA 108 (90 Base) MCG/ACT inhaler Inhale 2 puffs into the lungs every 6 (six) hours as needed for wheezing or shortness of breath.     No current facility-administered medications for this encounter.    Allergies  Allergen Reactions   Demerol [Meperidine Hcl]     migraine   Pneumococcal Vaccines  Other (See Comments)    Went into A-Fib immediately after receiving this    Shingrix [Zoster Vac Recomb Adjuvanted] Other (See Comments)    Went into A-Fib immediately after receiving this    Other Other (See Comments)    Anesthesia- Takes a lot to anesthetize the patient   Meperidine Nausea And Vomiting and Other (See Comments)    States she "About passed out," also   Prednisone Palpitations and Other (See Comments)    Tachycardia     Social History   Socioeconomic History   Marital status: Married    Spouse name: Not on file   Number of children: Not on file   Years of education: Not on file   Highest education level: Not on file  Occupational History   Not on file  Tobacco Use   Smoking status:  Never   Smokeless tobacco: Never   Tobacco comments:    Never smoke 07/26/21  Vaping Use   Vaping Use: Never used  Substance and Sexual Activity   Alcohol use: Never   Drug use: Never   Sexual activity: Not on file  Other Topics Concern   Not on file  Social History Narrative   Lives in [place] with spouse   Retired from school system.   Social Determinants of Health   Financial Resource Strain: Not on file  Food Insecurity: Not on file  Transportation Needs: No Transportation Needs (11/26/2021)   PRAPARE - Hydrologist (Medical): No    Lack of Transportation (Non-Medical): No  Physical Activity: Not on file  Stress: Not on file  Social Connections: Not on file  Intimate Partner Violence: Not on file    Family History  Problem Relation Age of Onset   Heart attack Mother    Heart disease Mother    Heart disease Sister    Heart disease Sister    Heart disease Sister    Heart disease Sister    Heart disease Sister     ROS- All systems are reviewed and negative except as per the HPI above  Physical Exam: Vitals:   12/06/21 0851  BP: 134/88  Pulse: 84  Weight: 97.5 kg  Height: '5\' 8"'$  (1.727 m)     Wt Readings from Last 3  Encounters:  12/06/21 97.5 kg  11/22/21 98.9 kg  11/20/21 100.5 kg    Labs: Lab Results  Component Value Date   NA 137 11/24/2021   K 4.1 11/24/2021   CL 110 11/24/2021   CO2 20 (L) 11/24/2021   GLUCOSE 96 11/24/2021   BUN 11 11/24/2021   CREATININE 0.83 11/24/2021   CALCIUM 9.2 11/24/2021   MG 2.2 11/24/2021   Lab Results  Component Value Date   INR 1.5 (H) 11/20/2021   No results found for: "CHOL", "HDL", "LDLCALC", "TRIG"   GEN- The patient is a well appearing obese female, alert and oriented x 3 today.   HEENT-head normocephalic, atraumatic, sclera clear, conjunctiva pink, hearing intact, trachea midline. Lungs- Clear to ausculation bilaterally, normal work of breathing Heart- Regular rate and rhythm, no murmurs, rubs or gallops  GI- soft, NT, ND, + BS Extremities- no clubbing, cyanosis, or edema MS- no significant deformity or atrophy Skin- no rash or lesion Psych- euthymic mood, full affect Neuro- strength and sensation are intact   EKG-  SR Vent. rate 84 BPM PR interval 180 ms QRS duration 84 ms QT/QTcB 384/453 ms  Echo 02/24/20 demonstrated  1. Left ventricular ejection fraction, by estimation, is 60 to 65%. The  left ventricle has normal function. The left ventricle has no regional  wall motion abnormalities. Left ventricular diastolic parameters are  consistent with Grade II diastolic dysfunction (pseudonormalization).   2. Right ventricular systolic function is normal. The right ventricular  size is normal. There is normal pulmonary artery systolic pressure. The estimated right ventricular systolic pressure is 09.3 mmHg.   3. Left atrial size was mildly dilated.   4. The mitral valve is normal in structure. Mild to moderate mitral valve regurgitation. No evidence of mitral stenosis.   5. The aortic valve is tricuspid. Aortic valve regurgitation is trivial.  No aortic stenosis is present.   6. Aortic dilatation noted. There is mild dilatation of the  aortic root,  measuring 38 mm.   7. The inferior vena cava is normal  in size with greater than 50%  respiratory variability, suggesting right atrial pressure of 3 mmHg.   Epic records reviewed  CHA2DS2-VASc Score = 3  The patient's score is based upon: CHF History: 0 HTN History: 1 Diabetes History: 0 Stroke History: 0 Vascular Disease History: 0 Age Score: 1 Gender Score: 1       ASSESSMENT AND PLAN: 1. Paroxysmal Atrial Fibrillation (ICD10:  I48.0) The patient's CHA2DS2-VASc score is 3, indicating a 3.2% annual risk of stroke.   S/p repeat ablation with Dr Rayann Heman 03/14/20 ILR shows no interim afib episodes.  Continue dofetilide 500 mcg BID. QT stable.  Continue diltiazem 120 mg daily with 30 mg PRN q 4hrs for heart racing. Continue Eliquis 5 mg BID   Echo results pending.  2. Secondary Hypercoagulable State (ICD10:  D68.69) The patient is at significant risk for stroke/thromboembolism based upon her CHA2DS2-VASc Score of 3.  Continue Apixaban (Eliquis). If she has recurrent anemia, could consider referral for [facility].   3. HTN Stable, no changes today.  4. Anemia OK per GI to resume Eliquis If she has further bleeding, may need push enteroscopy to reach AVM in distal duodenum.  Followed by Dr Lyndel Safe.   Follow up in the AF clinic in 6 months.     [facility] [address] [place], [place] [address] [phone]

## 2021-12-06 NOTE — Progress Notes (Signed)
  Echocardiogram 2D Echocardiogram has been performed.  Nicole [name] 12/06/2021, 8:48 AM

## 2021-12-16 LAB — CUP PACEART REMOTE DEVICE CHECK
Date Time Interrogation Session: 20230824233424
Implantable Pulse Generator Implant Date: 20191016

## 2021-12-18 ENCOUNTER — Ambulatory Visit (INDEPENDENT_AMBULATORY_CARE_PROVIDER_SITE_OTHER): Payer: Medicare Other

## 2021-12-18 DIAGNOSIS — R55 Syncope and collapse: Secondary | ICD-10-CM

## 2021-12-25 DIAGNOSIS — Z85828 Personal history of other malignant neoplasm of skin: Secondary | ICD-10-CM | POA: Diagnosis not present

## 2021-12-25 DIAGNOSIS — C44311 Basal cell carcinoma of skin of nose: Secondary | ICD-10-CM | POA: Diagnosis not present

## 2021-12-25 DIAGNOSIS — L570 Actinic keratosis: Secondary | ICD-10-CM | POA: Diagnosis not present

## 2021-12-25 DIAGNOSIS — L578 Other skin changes due to chronic exposure to nonionizing radiation: Secondary | ICD-10-CM | POA: Diagnosis not present

## 2021-12-25 DIAGNOSIS — X32XXXS Exposure to sunlight, sequela: Secondary | ICD-10-CM | POA: Diagnosis not present

## 2021-12-25 DIAGNOSIS — D0462 Carcinoma in situ of skin of left upper limb, including shoulder: Secondary | ICD-10-CM | POA: Diagnosis not present

## 2021-12-25 DIAGNOSIS — D485 Neoplasm of uncertain behavior of skin: Secondary | ICD-10-CM | POA: Diagnosis not present

## 2021-12-25 DIAGNOSIS — L814 Other melanin hyperpigmentation: Secondary | ICD-10-CM | POA: Diagnosis not present

## 2021-12-31 ENCOUNTER — Telehealth: Payer: Self-pay

## 2021-12-31 NOTE — Telephone Encounter (Signed)
LINQ alert received. Device reached RRT 9/10.  Atetmpted to contact patient to advise. No answer, LTMCB.

## 2021-12-31 NOTE — Telephone Encounter (Signed)
I spoke with the patient to let her know that she has reached RRT. I let her know that I will send a return kit to her so she can return the monitor.  The patient states she will like for the Loop to be removed at the hospital. I told her I will have the scheduler give her a call to set up an appointment to speak with Dr. Curt Bears or App about her concerns.

## 2021-12-31 NOTE — Telephone Encounter (Signed)
Marked "I" in [employer]. Discontinued from [place]. Canceled future remotes.

## 2021-12-31 NOTE — Telephone Encounter (Signed)
Spoke to pt. Informed that MD would take ILR out in the office but no in the hospital. Pt reports that insurance wouldn't pay for it to be implanted in the office back in 2019.  Aware I will check on having it taken out in the office and let her know. Patient verbalized understanding and agreeable to plan.

## 2022-01-01 NOTE — Telephone Encounter (Signed)
Patient called and advised her ILR has reached RRT. Return kit requested from [facility] via email. Patient advised if she has further questions or concerns call back. Patient verbalized understanding.

## 2022-01-09 ENCOUNTER — Other Ambulatory Visit: Payer: Self-pay | Admitting: Cardiology

## 2022-01-09 NOTE — Telephone Encounter (Signed)
Prescription refill request for Eliquis received. Indication: PAF Last office visit: 12/06/21  C Fenton PA Scr: 0.83 on 11/24/21 Age:  71 Weight: 97.5kg  Based on above findings Eliquis '5mg'$  twice daily is the appropriate dose.  Refill approved.

## 2022-01-11 NOTE — Progress Notes (Signed)
Carelink Summary Report / Loop Recorder 

## 2022-01-15 DIAGNOSIS — D0462 Carcinoma in situ of skin of left upper limb, including shoulder: Secondary | ICD-10-CM | POA: Diagnosis not present

## 2022-01-23 DIAGNOSIS — I4891 Unspecified atrial fibrillation: Secondary | ICD-10-CM | POA: Diagnosis not present

## 2022-01-23 DIAGNOSIS — D649 Anemia, unspecified: Secondary | ICD-10-CM | POA: Diagnosis not present

## 2022-01-26 IMAGING — DX DG CHEST 1V PORT
1 series · 1 of 1 positions shown · non-contrast
Comparison: 02/29/2020

CLINICAL DATA: Chest pain

EXAM:
PORTABLE CHEST 1 VIEW

[chest ap]
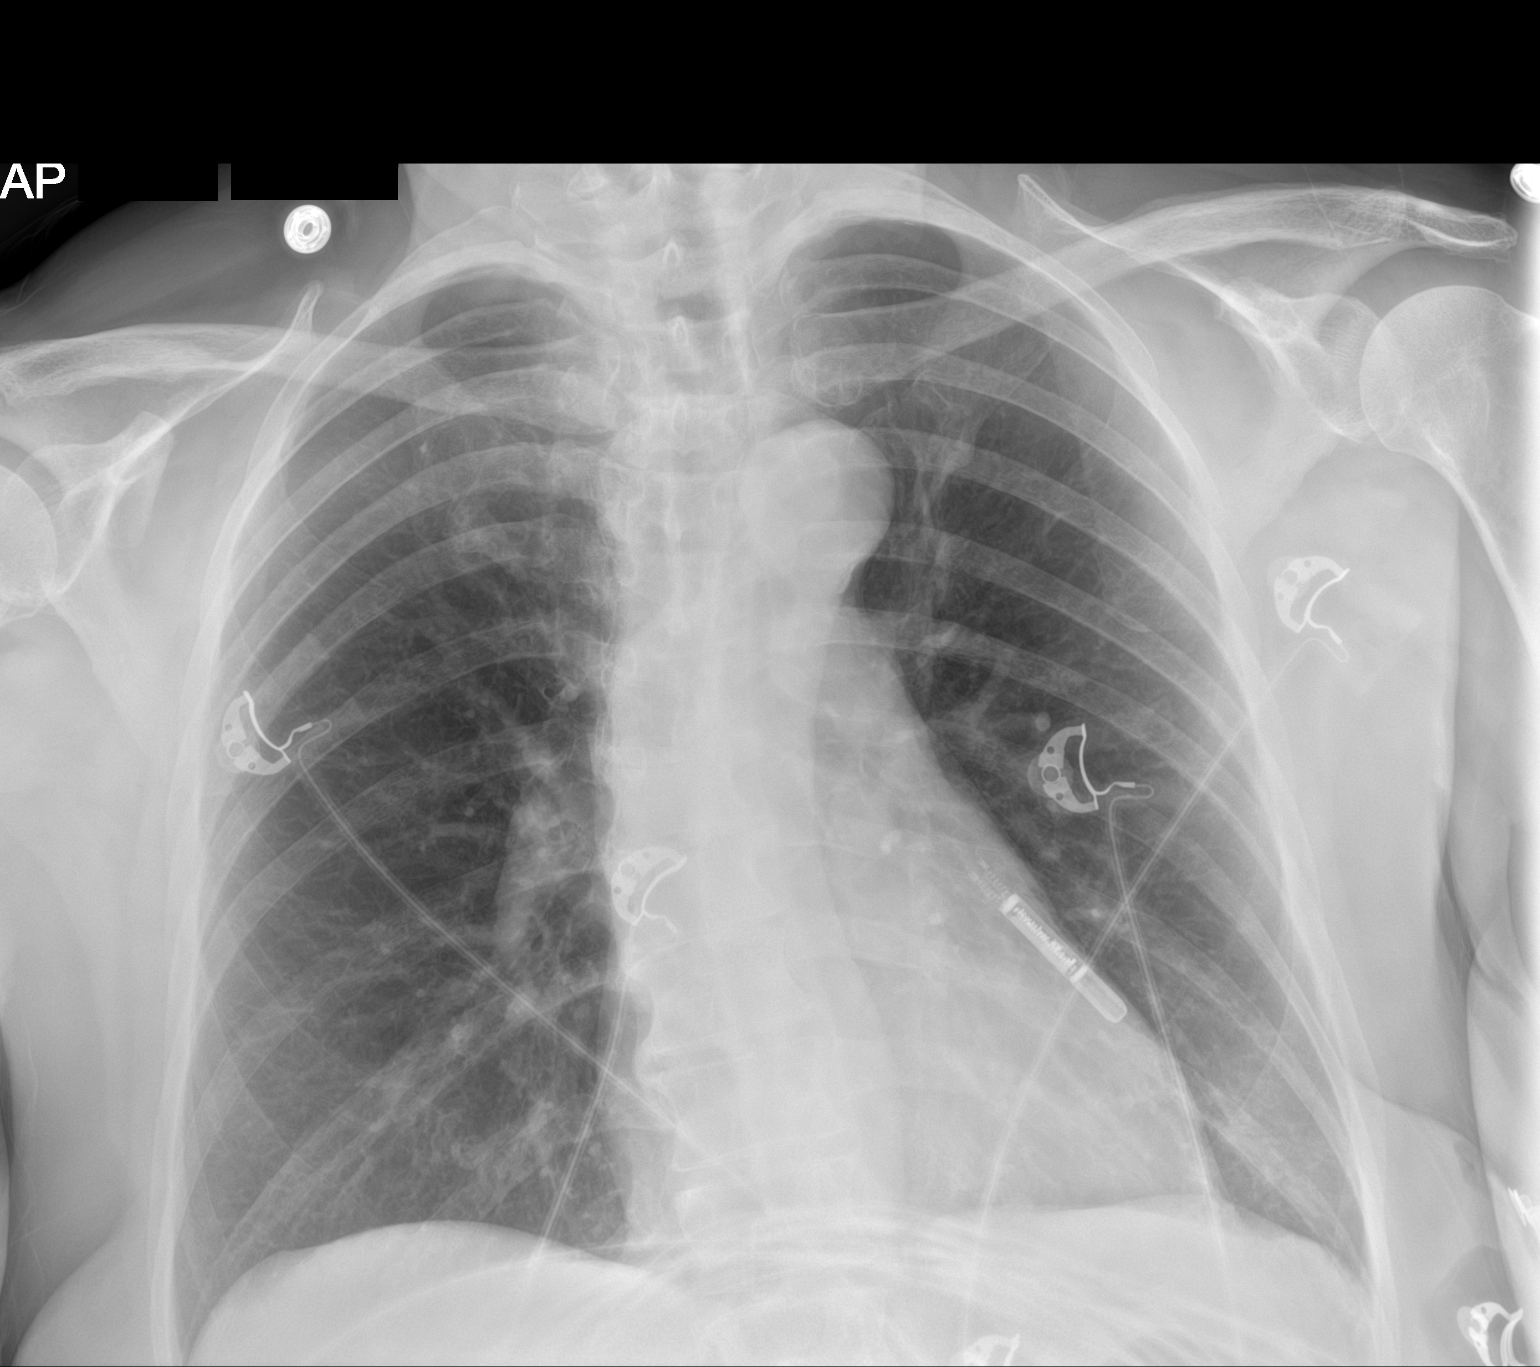

[1 of 1 positions shown; findings below may reference images not displayed]

FINDINGS: Lungs are clear.  No pleural effusion or pneumothorax.

The heart is normal in size.
IMPRESSION: No evidence of acute cardiopulmonary disease.

## 2022-01-30 DIAGNOSIS — C44311 Basal cell carcinoma of skin of nose: Secondary | ICD-10-CM | POA: Diagnosis not present

## 2022-02-01 ENCOUNTER — Emergency Department (HOSPITAL_COMMUNITY)
Admission: EM | Admit: 2022-02-01 | Discharge: 2022-02-01 | Disposition: A | Discharge status: Home / Self Care | Payer: Medicare Other | Attending: Emergency Medicine | Admitting: Emergency Medicine

## 2022-02-01 ENCOUNTER — Emergency Department (HOSPITAL_COMMUNITY): Payer: Medicare Other

## 2022-02-01 DIAGNOSIS — I4891 Unspecified atrial fibrillation: Secondary | ICD-10-CM | POA: Diagnosis not present

## 2022-02-01 DIAGNOSIS — R002 Palpitations: Secondary | ICD-10-CM

## 2022-02-01 DIAGNOSIS — N390 Urinary tract infection, site not specified: Secondary | ICD-10-CM | POA: Insufficient documentation

## 2022-02-01 DIAGNOSIS — R079 Chest pain, unspecified: Secondary | ICD-10-CM | POA: Diagnosis not present

## 2022-02-01 DIAGNOSIS — Z7901 Long term (current) use of anticoagulants: Secondary | ICD-10-CM | POA: Diagnosis not present

## 2022-02-01 DIAGNOSIS — R000 Tachycardia, unspecified: Secondary | ICD-10-CM | POA: Diagnosis not present

## 2022-02-01 DIAGNOSIS — R0789 Other chest pain: Secondary | ICD-10-CM | POA: Diagnosis not present

## 2022-02-01 LAB — CBC WITH DIFFERENTIAL/PLATELET
Abs Immature Granulocytes: 0.02 10*3/uL (ref 0.00–0.07)
Basophils Absolute: 0 10*3/uL (ref 0.0–0.1)
Basophils Relative: 1 %
Eosinophils Absolute: 0.2 10*3/uL (ref 0.0–0.5)
Eosinophils Relative: 4 %
HCT: 31.9 % — ABNORMAL LOW (ref 36.0–46.0)
Hemoglobin: 10 g/dL — ABNORMAL LOW (ref 12.0–15.0)
Immature Granulocytes: 0 %
Lymphocytes Relative: 20 %
Lymphs Abs: 1.2 10*3/uL (ref 0.7–4.0)
MCH: 28.9 pg (ref 26.0–34.0)
MCHC: 31.3 g/dL (ref 30.0–36.0)
MCV: 92.2 fL (ref 80.0–100.0)
Monocytes Absolute: 0.7 10*3/uL (ref 0.1–1.0)
Monocytes Relative: 12 %
Neutro Abs: 3.8 10*3/uL (ref 1.7–7.7)
Neutrophils Relative %: 63 %
Platelets: 267 10*3/uL (ref 150–400)
RBC: 3.46 MIL/uL — ABNORMAL LOW (ref 3.87–5.11)
RDW: 19.6 % — ABNORMAL HIGH (ref 11.5–15.5)
WBC: 5.9 10*3/uL (ref 4.0–10.5)
nRBC: 0 % (ref 0.0–0.2)

## 2022-02-01 LAB — URINALYSIS, ROUTINE W REFLEX MICROSCOPIC
Bilirubin Urine: NEGATIVE
Glucose, UA: NEGATIVE mg/dL
Hgb urine dipstick: NEGATIVE
Ketones, ur: NEGATIVE mg/dL
Nitrite: NEGATIVE
Protein, ur: NEGATIVE mg/dL
Specific Gravity, Urine: 1.006 (ref 1.005–1.030)
pH: 6 (ref 5.0–8.0)

## 2022-02-01 LAB — COMPREHENSIVE METABOLIC PANEL
ALT: 14 U/L (ref 0–44)
AST: 26 U/L (ref 15–41)
Albumin: 3.7 g/dL (ref 3.5–5.0)
Alkaline Phosphatase: 68 U/L (ref 38–126)
Anion gap: 9 (ref 5–15)
BUN: 12 mg/dL (ref 8–23)
CO2: 21 mmol/L — ABNORMAL LOW (ref 22–32)
Calcium: 9.2 mg/dL (ref 8.9–10.3)
Chloride: 108 mmol/L (ref 98–111)
Creatinine, Ser: 1.02 mg/dL — ABNORMAL HIGH (ref 0.44–1.00)
GFR, Estimated: 59 mL/min — ABNORMAL LOW (ref >60)
Glucose, Bld: 115 mg/dL — ABNORMAL HIGH (ref 70–99)
Potassium: 4.1 mmol/L (ref 3.5–5.1)
Sodium: 138 mmol/L (ref 135–145)
Total Bilirubin: 0.6 mg/dL (ref 0.3–1.2)
Total Protein: 6.9 g/dL (ref 6.5–8.1)

## 2022-02-01 LAB — TSH: TSH: 2.228 u[IU]/mL (ref 0.350–4.500)

## 2022-02-01 LAB — T4, FREE: Free T4: 0.71 ng/dL (ref 0.61–1.12)

## 2022-02-01 LAB — MAGNESIUM: Magnesium: 2.1 mg/dL (ref 1.7–2.4)

## 2022-02-01 MED ORDER — CEFDINIR 300 MG PO CAPS: 300.0000 mg | ORAL_CAPSULE | Freq: Two times a day (BID) | ORAL | 0 refills | Status: AC

## 2022-02-01 MED ORDER — SODIUM CHLORIDE 0.9 % IV SOLN
1.0000 g | Freq: Once | INTRAVENOUS | Status: AC
  Administered 2022-02-01: 1 g via INTRAVENOUS
  Filled 2022-02-01: qty 10

## 2022-02-01 NOTE — ED Provider Notes (Signed)
[facility] EMERGENCY DEPARTMENT Provider Note   CSN: [id number] Arrival date & time: 02/01/22  0730     History  Chief Complaint  Patient presents with   Palpitations    Nicole [name] is a 71 y.o. female.  With PMH of chronic anemia, paroxysmal A-fib on Eliquis, recent admission in August 2023 for anemia secondary to GI bleed found to have 2 nonbleeding areas in her small intestine which were not cauterized but recommended by Dr. Lyndel Safe to be cauterized in the future if rebleed now presenting with palpitations and A-fib RVR with EMS.  She woke up this morning from palpitations checked her cardia mobile and found to be A-fib RVR with rate in the low 200s.  She took a dose of Cardizem 30 mg at 1 AM and a repeat dose of Cardizem 30 mg at 5 in the morning and also took her home Eliquis dose and Tikosyn dose.  When EMS got to her, she was A-fib RVR 1 20-1 40s.  They did not administer any medications.  Since having this episode and arriving to the ER, she feels significantly better.  She feels only slight palpitations but denies any chest pain or active shortness of breath.  She has some mild dyspnea on exertion at baseline.  There is no new changes there.  The swelling in her legs is chronic and there has been no new change or worsening.  She denies any recent fevers, nausea, vomiting, urinary symptoms, melena or hematochezia.  She has been generally feeling well.  She only notes increased episodes of A-fib RVR since her recent hospitalization.  She is consistent with taking electrolyte supplements daily and not missing any of her medications.  HPI     Home Medications Prior to Admission medications   Medication Sig Start Date End Date Taking? Authorizing Provider  acetaminophen (TYLENOL) 500 MG tablet Take 1,000 mg by mouth every 6 (six) hours as needed for mild pain or headache.   Yes [provider]  butalbital-acetaminophen-caffeine (FIORICET) 50-325-40 MG tablet  Take 1 tablet by mouth every 8 (eight) hours as needed for headache or migraine.    Yes [provider]  butorphanol (STADOL) 10 MG/ML nasal spray Place 1 spray into the nose every 4 (four) hours as needed for headache or migraine.    Yes [provider]  cefdinir (OMNICEF) 300 MG capsule Take 1 capsule (300 mg total) by mouth 2 (two) times daily for 5 days. 02/01/22 02/06/22 Yes Elgie Congo, MD  cetirizine (ZYRTEC) 10 MG tablet Take 10 mg by mouth daily as needed for allergies or rhinitis.   Yes [provider]  clonazePAM (KLONOPIN) 1 MG tablet Take 1 mg by mouth 3 (three) times daily.   Yes [provider]  diltiazem (CARDIZEM CD) 120 MG 24 hr capsule Take 1 capsule (120 mg total) by mouth daily. 11/06/21  Yes Fenton, Clint R, PA  diltiazem (CARDIZEM) 30 MG tablet Take 1 Tablet Every 4 Hours As Needed For HR >100 11/15/21  Yes Fenton, Clint R, PA  dofetilide (TIKOSYN) 500 MCG capsule Take 1 capsule (500 mcg total) by mouth 2 (two) times daily. 11/15/21  Yes Camnitz, Will Hassell Done, MD  ELIQUIS 5 MG TABS tablet TAKE 1 TABLET BY MOUTH TWICE A DAY Patient taking differently: Take 5 mg by mouth 2 (two) times daily. 01/09/22  Yes Camnitz, Ocie Doyne, MD  ferrous sulfate 325 (65 FE) MG EC tablet Take 325 mg by mouth daily. 11/28/21  Yes [provider]  fluticasone-salmeterol (ADVAIR HFA) 115-21 MCG/ACT inhaler Inhale 2 puffs into the lungs 2 (two) times daily as needed (asthma).   Yes [provider]  furosemide (LASIX) 20 MG tablet TAKE 1 TABLET BY MOUTH EVERY DAY AS NEEDED FOR EDEMA Patient taking differently: Take 20 mg by mouth daily as needed for edema. 07/23/21  Yes Camnitz, Will Hassell Done, MD  gentamicin ointment (GARAMYCIN) 0.1 % Apply 1 Application topically 2 (two) times daily. 01/24/22  Yes [provider]  KLOR-CON M20 20 MEQ tablet TAKE 1 TABLET BY MOUTH EVERY DAY Patient taking differently: Take 20 mEq by mouth once. 11/28/21  Yes  Camnitz, Will Hassell Done, MD  pravastatin (PRAVACHOL) 40 MG tablet Take 40 mg by mouth at bedtime.  11/30/18  Yes [provider]  promethazine (PHENERGAN) 25 MG tablet Take 25 mg by mouth every 6 (six) hours as needed for nausea or vomiting (from migraines).    Yes [provider]  sertraline (ZOLOFT) 50 MG tablet Take 50 mg by mouth at bedtime. 06/11/21  Yes [provider]  VENTOLIN HFA 108 (90 Base) MCG/ACT inhaler Inhale 2 puffs into the lungs every 6 (six) hours as needed for wheezing or shortness of breath.   Yes [provider]      Allergies    Demerol [meperidine hcl], Pneumococcal vaccines, Shingrix [zoster vac recomb adjuvanted], Other, Meperidine, and Prednisone    Review of Systems   Review of Systems  Physical Exam Updated Vital Signs BP 133/80   Pulse 68   Temp 98.1 F (36.7 C)   Resp (!) 24   SpO2 99%  Physical Exam Constitutional: Alert and oriented. Well appearing and in no distress. Eyes: Conjunctivae are normal. ENT      Head: Normocephalic and atraumatic.      Nose: No congestion.      Mouth/Throat: Mucous membranes are moist.      Neck: No stridor. Cardiovascular: S1, S2, regular rhythm, regular rate, normal and symmetric distal pulses are present in all extremities.Warm and well perfused. Respiratory: Normal respiratory effort. Breath sounds are normal.  O2 sat 99 on RA. Gastrointestinal: Soft and nontender.  Musculoskeletal: Normal range of motion in all extremities. Nontender equal nonpitting edema of the bilateral lower extremities extending from the foot to the knee Neurologic: Normal speech and language.  No facial droop.  Moving all extremities equally.  Sensation grossly intact.  No gross focal neurologic deficits are appreciated. Skin: Skin is warm, dry and intact. No rash noted. Psychiatric: Mood and affect are normal. Speech and behavior are normal.  ED Results / Procedures / Treatments   Labs (all labs ordered  are listed, but only abnormal results are displayed) Labs Reviewed  COMPREHENSIVE METABOLIC PANEL - Abnormal; Notable for the following components:      Result Value   CO2 21 (*)    Glucose, Bld 115 (*)    Creatinine, Ser 1.02 (*)    GFR, Estimated 59 (*)    All other components within normal limits  CBC WITH DIFFERENTIAL/PLATELET - Abnormal; Notable for the following components:   RBC 3.46 (*)    Hemoglobin 10.0 (*)    HCT 31.9 (*)    RDW 19.6 (*)    All other components within normal limits  URINALYSIS, ROUTINE W REFLEX MICROSCOPIC - Abnormal; Notable for the following components:   Leukocytes,Ua LARGE (*)    Bacteria, UA FEW (*)    All other components within normal limits  MAGNESIUM  T4, FREE  TSH    EKG EKG Interpretation  Date/Time:  Friday February 01 2022 07:48:54 EDT Ventricular Rate:  88 PR Interval:  184 QRS Duration: 92 QT Interval:  381 QTC Calculation: 461 R Axis:   57 Text Interpretation: Sinus rhythm Borderline T wave abnormalities Confirmed by Georgina Snell [phone]) on 02/01/2022 7:54:13 AM  Radiology DG Chest 2 View  Result Date: 02/01/2022 CLINICAL DATA:  Atrial fibrillation with rapid ventricular response, palpitations EXAM: CHEST - 2 VIEW COMPARISON:  11/20/2021 FINDINGS: Loop recorder projects over LEFT chest. Normal heart size, mediastinal contours, and pulmonary vascularity. Atherosclerotic calcification aorta. Lungs clear. No pulmonary infiltrate, pleural effusion, or pneumothorax. Bones demineralized with biconvex thoracic scoliosis. IMPRESSION: No acute abnormalities. Aortic Atherosclerosis (ICD10-I70.0). Electronically Signed   By: Lavonia Dana M.D.   On: 02/01/2022 08:10    Procedures Procedures  Remain on constant cardiac monitoring, remained in normal sinus rhythm with normal rates.  Medications Ordered in ED Medications  cefTRIAXone (ROCEPHIN) 1 g in sodium chloride 0.9 % 100 mL IVPB (0 g Intravenous Stopped 02/01/22 1007)    ED  Course/ Medical Decision Making/ A&P Clinical Course as of 02/01/22 1023  Fri Feb 01, 2022  0819 Personally interpreted patient's CXR no consolidation concerning for pneumonia, no pneumothorax, no pulmonary edema, agree with negative read by radiology. [VB]  [id number] Patient's UA consistent with UTI large leukocyte esterase 21-50 WBCs and few bacteria likely underlying possible trigger for A-fib RVR.  Normal white blood cell count 5.9, no hemodynamic instability, systemic symptoms or confusion, not concern for pyelonephritis or sepsis.  Ordered for 1 dose IV Rocephin.  Her hemoglobin is 10.0 improved from prior. [VB]  7106 Her creatinine was slightly elevated 1.02 by suspect that is likely due to underlying UTI.  Otherwise no acute electrolyte abnormalities.  Magnesium 2.1 within normal limits.  Potassium 4.1.  Normal free T4 and normal TSH.  She is feeling well and asymptomatic.  Her heart rate is in the 70s.  She would like to go home and follow-up with her A-fib clinic.  I advised her to continue her home meds as prescribed.  And I discharged her with 5 days of cefdinir for UTI.  Strict return precautions discussed. [VB]    Clinical Course User Index [VB] Elgie Congo, MD                           Medical Decision Making Tai Syfert is a 71 y.o. female.  With PMH of chronic anemia, paroxysmal A-fib on Eliquis, recent admission in August 2023 for anemia secondary to GI bleed found to have 2 nonbleeding areas in her small intestine which were not cauterized but recommended by Dr. Lyndel Safe to be cauterized in the future if rebleed now presenting with palpitations and A-fib RVR with EMS.  She woke up this morning from palpitations checked her cardia mobile and found to be A-fib RVR with rate in the low 200s.  She took a dose of Cardizem 30 mg at 1 AM and a repeat dose of Cardizem 30 mg at 5 in the morning and also took her home Eliquis dose and Tikosyn dose.  When EMS got to her, she was A-fib RVR 1  20-1 40s.    Patient has known paroxysmal A-fib on Cardizem and Tikosyn and Eliquis who presented after episode of paroxysmal A-fib earlier this morning that resolved upon arrival to the ED.  On arrival to the ED, she has  normal sinus rhythm rates in the 90s with stable blood pressure, no hypoxia or increased work of breathing and nontoxic appearance  EKG normal sinus rhythm rate 88 with nonspecific T wave flattening changes in V2 V3.  No acute ST elevation or depression or concerning acute ischemic changes.  QTc 461.  No concern for ACS with no associated chest pain, no diaphoresis and no concerning symptoms with known paroxysmal A-fib.   We will obtain labs to further evaluate for any evidence of anemia, acute electrolyte abnormality or thyroid abnormality or infection such as pneumonia or UTI with basic blood work, chest x-ray and UA to evaluate for any inciting cause of event today.  If all reassuring, plan to likely discharge patient home with follow-up with A-fib clinic.  Amount and/or Complexity of Data Reviewed Labs: ordered. Radiology: ordered.  Risk Prescription drug management.    Final Clinical Impression(s) / ED Diagnoses Final diagnoses:  Atrial fibrillation with rapid ventricular response (HCC)  Palpitations  Urinary tract infection without hematuria, site unspecified    Rx / DC Orders ED Discharge Orders          Ordered    Ambulatory referral to Cardiology       Comments: If you have not heard from the Cardiology office within the next 72 hours please call [phone].   02/01/22 1006    cefdinir (OMNICEF) 300 MG capsule  2 times daily        02/01/22 1006              Elgie Congo, MD 02/01/22 1023

## 2022-02-01 NOTE — ED Triage Notes (Signed)
Pt bib ems from home c/o afib RVR Pt had palpitations that woke her up at 3 am today. Pt took 30 mg cardizem at 3 am and 30 mg at 530 am.  BP 140/84 RA 97%

## 2022-02-01 NOTE — Discharge Instructions (Addendum)
You were seen for an episode of A-fib with fast rate that resolved after taking your home medications.  Overall your work-up here including blood work, chest x-ray, physical exam and UA were reassuring.  As we discussed your urine suggest you have a urinary tract infection.  Take the antibiotics as prescribed.  Continue to take your home meds as prescribed and do not miss any medications.  Make sure to make an appointment with the A-fib clinic for follow-up in the outpatient setting.  Come back for any severe chest pain, worsening shortness of breath, return of fast heart rates uncontrolled with home medications, or any other symptoms concerning to you.

## 2022-02-05 ENCOUNTER — Ambulatory Visit (HOSPITAL_COMMUNITY)
Admission: RE | Admit: 2022-02-05 | Discharge: 2022-02-05 | Disposition: A | Discharge status: Home / Self Care | Payer: Medicare Other | Source: Ambulatory Visit | Attending: Physician Assistant | Admitting: Physician Assistant

## 2022-02-05 ENCOUNTER — Encounter (HOSPITAL_COMMUNITY): Payer: Self-pay | Admitting: Physician Assistant

## 2022-02-05 VITALS — BP 138/90 | HR 88 | Ht 68.0 in | Wt 213.2 lb

## 2022-02-05 DIAGNOSIS — I480 Paroxysmal atrial fibrillation: Secondary | ICD-10-CM | POA: Insufficient documentation

## 2022-02-05 DIAGNOSIS — Z79899 Other long term (current) drug therapy: Secondary | ICD-10-CM | POA: Diagnosis not present

## 2022-02-05 DIAGNOSIS — Z7901 Long term (current) use of anticoagulants: Secondary | ICD-10-CM | POA: Insufficient documentation

## 2022-02-05 DIAGNOSIS — D6869 Other thrombophilia: Secondary | ICD-10-CM

## 2022-02-05 DIAGNOSIS — D649 Anemia, unspecified: Secondary | ICD-10-CM | POA: Insufficient documentation

## 2022-02-05 DIAGNOSIS — I10 Essential (primary) hypertension: Secondary | ICD-10-CM | POA: Insufficient documentation

## 2022-02-05 MED ORDER — DILTIAZEM HCL ER COATED BEADS 180 MG PO CP24: 180.0000 mg | ORAL_CAPSULE | Freq: Every day | ORAL | 6 refills | Status: DC

## 2022-02-05 MED ORDER — DILTIAZEM HCL 30 MG PO TABS: ORAL_TABLET | ORAL | 1 refills | Status: DC

## 2022-02-05 NOTE — Progress Notes (Signed)
Primary Care Physician: Harvie Junior, MD Referring Physician: Dr. Rayann Heman Primary EP: Dr Emogene Morgan is a 72 y.o. female with a h/o anemia,  afib, and syncope possibly 2/2 termination pause  that presented to his office with afib with RVR at 160 bpm and was admitted for Tikosyn load. Converted to SR without pause. Qtc remained stable. She is on Eliquis for a CHADS2VASC score of 3. She is s/p repeat afib ablation with Dr Rayann Heman on 03/14/20. Patient had tachypalpitations on 02/27/21 and took one of her PRN diltiazem which did not resolve her symptoms. EMS was called and ECG conformed afib with RVR. She was given IV diltiazem which converted her to SR. Patient does report that he niece has passed away the day before the onset of her symptoms. She has had 3 episodes of afib since the start of 2023 and went to the ED 05/09/21. She spontaneously converted en route. She was seen by Dr Curt Bears 06/18/21 and her BB was increased.   At her visit 11/20/21, patient reported that she had been feeling "terrible". She becomes very SOB with minimal exertion. CBC showed severe anemia with Hgb 5.2, she was sent to the ED for evaluation and transfusion, given 2 units PRBCs. Patient underwent EGD/pill endoscopy and colonoscopy. Eliquis was held initially due to suspected GI bleed. Dr. Lyndel Safe updated the patient on the results of her pill endoscopy on the afternoon of 11/23/2021 noting two areas of nonactive bleeding in her small intestine that could explain her positive FOBT. Patient reports that she felt better almost immediately after her first transfusion.   On follow up today, patient was seen at the ED 02/01/22 with rapid afib in the setting of a UTI. She woke that morning with palpitations and checked her Kardia mobile and found to be A-fib RVR with rate in the low 200s.  She took a dose of Cardizem 30 mg at 1 AM and a repeat dose of Cardizem 30 mg at 5 in the morning and also took her home Eliquis dose and  Tikosyn dose.  When EMS got to her, she was A-fib RVR 120-140s bpm. She was back in SR on arrival to the ED.   Today, she denies symptoms of palpitations, SOB, CP, orthopnea, PND, dizziness, presyncope, syncope, or neurologic sequela.  otherwise without complaint today.   Past Medical History:  Diagnosis Date   Anticoagulant long-term use 11/16/2014   Anxiety 10/06/2017   Arthritis 10/06/2017   knees bilateral   Asthma 11/16/2014   Esophageal reflux 11/16/2014   Essential hypertension 08/15/2015   pt denies   H/O: GI bleed 12/11/2016   Hyperlipidemia 11/17/2014   Migraine headache 11/16/2014   Nausea 11/16/2014   Pain in limb 11/16/2014   Panic disorder 11/16/2014   Paroxysmal atrial fibrillation ([place]) 11/17/2014   Swelling of joint 11/16/2014   Symptomatic anemia 11/24/2016   Tendonitis 11/16/2014   UTI (urinary tract infection) 12/19/2015   Past Surgical History:  Procedure Laterality Date   ABDOMINAL HYSTERECTOMY     ATRIAL FIBRILLATION ABLATION N/A 03/14/2020   Procedure: ATRIAL FIBRILLATION ABLATION;  Surgeon: [name], MD;  Location: [place] CV LAB;  Service: Cardiovascular;  Laterality: N/A;   BIOPSY  11/22/2021   Procedure: BIOPSY;  Surgeon: Daryel November, MD;  Location: [facility] ENDOSCOPY;  Service: Gastroenterology;;   CARDIOVERSION     CESAREAN SECTION     COLONOSCOPY     COLONOSCOPY N/A 11/22/2021   Procedure: COLONOSCOPY;  Surgeon: Candis Schatz,  Gladstone Pih, MD;  Location: [place] ENDOSCOPY;  Service: Gastroenterology;  Laterality: N/A;   ESOPHAGOGASTRODUODENOSCOPY (EGD) WITH PROPOFOL N/A 11/22/2021   Procedure: ESOPHAGOGASTRODUODENOSCOPY (EGD) WITH PROPOFOL;  Surgeon: Daryel November, MD;  Location: [place];  Service: Gastroenterology;  Laterality: N/A;   GIVENS CAPSULE STUDY N/A 11/22/2021   Procedure: GIVENS CAPSULE STUDY;  Surgeon: Daryel November, MD;  Location: [place];  Service: Gastroenterology;  Laterality: N/A;   LOOP RECORDER INSERTION N/A 02/04/2018    Procedure: LOOP RECORDER INSERTION;  Surgeon: [name], MD;  Location: [place] CV LAB;  Service: Cardiovascular;  Laterality: N/A;   POLYPECTOMY  11/22/2021   Procedure: POLYPECTOMY;  Surgeon: Daryel November, MD;  Location: [facility] ENDOSCOPY;  Service: Gastroenterology;;   ROTATOR CUFF REPAIR Left     Current Outpatient Medications  Medication Sig Dispense Refill   acetaminophen (TYLENOL) 500 MG tablet Take 1,000 mg by mouth every 6 (six) hours as needed for mild pain or headache.     butalbital-acetaminophen-caffeine (FIORICET) 50-325-40 MG tablet Take 1 tablet by mouth every 8 (eight) hours as needed for headache or migraine.      butorphanol (STADOL) 10 MG/ML nasal spray Place 1 spray into the nose every 4 (four) hours as needed for headache or migraine.      cefdinir (OMNICEF) 300 MG capsule Take 1 capsule (300 mg total) by mouth 2 (two) times daily for 5 days. 10 capsule 0   cetirizine (ZYRTEC) 10 MG tablet Take 10 mg by mouth daily as needed for allergies or rhinitis.     clonazePAM (KLONOPIN) 1 MG tablet Take 1 mg by mouth 3 (three) times daily.     dofetilide (TIKOSYN) 500 MCG capsule Take 1 capsule (500 mcg total) by mouth 2 (two) times daily. 180 capsule 2   ELIQUIS 5 MG TABS tablet TAKE 1 TABLET BY MOUTH TWICE A DAY 60 tablet 5   ferrous sulfate 325 (65 FE) MG EC tablet Take 325 mg by mouth daily.     fluticasone-salmeterol (ADVAIR HFA) 115-21 MCG/ACT inhaler Inhale 2 puffs into the lungs 2 (two) times daily as needed (asthma).     furosemide (LASIX) 20 MG tablet TAKE 1 TABLET BY MOUTH EVERY DAY AS NEEDED FOR EDEMA 90 tablet 3   gentamicin ointment (GARAMYCIN) 0.1 % Apply 1 Application topically 2 (two) times daily.     KLOR-CON M20 20 MEQ tablet TAKE 1 TABLET BY MOUTH EVERY DAY 90 tablet 3   pravastatin (PRAVACHOL) 40 MG tablet Take 40 mg by mouth at bedtime.      promethazine (PHENERGAN) 25 MG tablet Take 25 mg by mouth every 6 (six) hours as needed for nausea or vomiting  (from migraines).      sertraline (ZOLOFT) 50 MG tablet Take 50 mg by mouth at bedtime.     VENTOLIN HFA 108 (90 Base) MCG/ACT inhaler Inhale 2 puffs into the lungs every 6 (six) hours as needed for wheezing or shortness of breath.     diltiazem (CARDIZEM CD) 180 MG 24 hr capsule Take 1 capsule (180 mg total) by mouth daily. 30 capsule 6   diltiazem (CARDIZEM) 30 MG tablet Take 1 Tablet Every 4 Hours As Needed For HR >100 45 tablet 1   No current facility-administered medications for this encounter.    Allergies  Allergen Reactions   Demerol [Meperidine Hcl]     migraine   Pneumococcal Vaccines Other (See Comments)    Went into A-Fib immediately after receiving this  Shingrix [Zoster Vac Recomb Adjuvanted] Other (See Comments)    Went into A-Fib immediately after receiving this    Other Other (See Comments)    Anesthesia- Takes a lot to anesthetize the patient   Meperidine Nausea And Vomiting and Other (See Comments)    States she "About passed out," also   Prednisone Palpitations and Other (See Comments)    Tachycardia     Social History   Socioeconomic History   Marital status: Married    Spouse name: Not on file   Number of children: Not on file   Years of education: Not on file   Highest education level: Not on file  Occupational History   Not on file  Tobacco Use   Smoking status: Never   Smokeless tobacco: Never   Tobacco comments:    Never smoke 07/26/21  Vaping Use   Vaping Use: Never used  Substance and Sexual Activity   Alcohol use: Never   Drug use: Never   Sexual activity: Not on file  Other Topics Concern   Not on file  Social History Narrative   Lives in [place] with spouse   Retired from school system.   Social Determinants of Health   Financial Resource Strain: Not on file  Food Insecurity: Not on file  Transportation Needs: No Transportation Needs (11/26/2021)   PRAPARE - Hydrologist (Medical): No    Lack of  Transportation (Non-Medical): No  Physical Activity: Not on file  Stress: Not on file  Social Connections: Not on file  Intimate Partner Violence: Not on file    Family History  Problem Relation Age of Onset   Heart attack Mother    Heart disease Mother    Heart disease Sister    Heart disease Sister    Heart disease Sister    Heart disease Sister    Heart disease Sister     ROS- All systems are reviewed and negative except as per the HPI above  Physical Exam: Vitals:   02/05/22 1355  BP: (!) 138/90  Pulse: 88  Weight: 96.7 kg  Height: '5\' 8"'$  (1.727 m)      Wt Readings from Last 3 Encounters:  02/05/22 96.7 kg  12/06/21 97.5 kg  11/22/21 98.9 kg    Labs: Lab Results  Component Value Date   NA 138 02/01/2022   K 4.1 02/01/2022   CL 108 02/01/2022   CO2 21 (L) 02/01/2022   GLUCOSE 115 (H) 02/01/2022   BUN 12 02/01/2022   CREATININE 1.02 (H) 02/01/2022   CALCIUM 9.2 02/01/2022   MG 2.1 02/01/2022   Lab Results  Component Value Date   INR 1.5 (H) 11/20/2021   No results found for: "CHOL", "HDL", "LDLCALC", "TRIG"   GEN- The patient is a well appearing female, alert and oriented x 3 today.   HEENT-head normocephalic, atraumatic, sclera clear, conjunctiva pink, hearing intact, trachea midline. Lungs- Clear to ausculation bilaterally, normal work of breathing Heart- Regular rate and rhythm, no murmurs, rubs or gallops  GI- soft, NT, ND, + BS Extremities- no clubbing, cyanosis, or edema MS- no significant deformity or atrophy Skin- no rash or lesion Psych- euthymic mood, full affect Neuro- strength and sensation are intact   EKG-  SR Vent. rate 88 BPM PR interval 180 ms QRS duration 82 ms QT/QTcB 372/450 ms  Echo 02/24/20 demonstrated  1. Left ventricular ejection fraction, by estimation, is 60 to 65%. The  left ventricle has normal function.  The left ventricle has no regional  wall motion abnormalities. Left ventricular diastolic parameters are   consistent with Grade II diastolic dysfunction (pseudonormalization).   2. Right ventricular systolic function is normal. The right ventricular  size is normal. There is normal pulmonary artery systolic pressure. The estimated right ventricular systolic pressure is 02.1 mmHg.   3. Left atrial size was mildly dilated.   4. The mitral valve is normal in structure. Mild to moderate mitral valve regurgitation. No evidence of mitral stenosis.   5. The aortic valve is tricuspid. Aortic valve regurgitation is trivial.  No aortic stenosis is present.   6. Aortic dilatation noted. There is mild dilatation of the aortic root,  measuring 38 mm.   7. The inferior vena cava is normal in size with greater than 50%  respiratory variability, suggesting right atrial pressure of 3 mmHg.   Epic records reviewed  CHA2DS2-VASc Score = 3  The patient's score is based upon: CHF History: 0 HTN History: 1 Diabetes History: 0 Stroke History: 0 Vascular Disease History: 0 Age Score: 1 Gender Score: 1       ASSESSMENT AND PLAN: 1. Paroxysmal Atrial Fibrillation (ICD10:  I48.0) The patient's CHA2DS2-VASc score is 3, indicating a 3.2% annual risk of stroke.   S/p repeat ablation with Dr Rayann Heman 03/14/20 [facility] at [facility]. She is unsure if she wants it explanted. She may benefit from having a new one placed.  Suspect recent afib episode triggered by UTI. Continue dofetilide 500 mcg BID. QT stable.  Increase diltiazem to 180 mg daily with 30 mg PRN q 4hrs for heart racing. Continue Eliquis 5 mg BID    2. Secondary Hypercoagulable State (ICD10:  D68.69) The patient is at significant risk for stroke/thromboembolism based upon her CHA2DS2-VASc Score of 3.  Continue Apixaban (Eliquis). If she has recurrent anemia, could consider referral for Watchman.   3. HTN Stable, med changes as above.   4. Anemia Hgb on 10/13 was 10.0 Followed by Dr Lyndel Safe.   Follow up with Dr Curt Bears in 2-3 months. AF clinic in 6 months.      [facility] [address] [place], [place] [address] [phone]

## 2022-02-05 NOTE — Patient Instructions (Signed)
Increase cardizem '180mg'$  once a day

## 2022-02-06 DIAGNOSIS — C44311 Basal cell carcinoma of skin of nose: Secondary | ICD-10-CM | POA: Diagnosis not present

## 2022-02-11 DIAGNOSIS — Z1231 Encounter for screening mammogram for malignant neoplasm of breast: Secondary | ICD-10-CM | POA: Diagnosis not present

## 2022-02-11 DIAGNOSIS — R92323 Mammographic fibroglandular density, bilateral breasts: Secondary | ICD-10-CM | POA: Diagnosis not present

## 2022-02-11 DIAGNOSIS — Z950 Presence of cardiac pacemaker: Secondary | ICD-10-CM | POA: Diagnosis not present

## 2022-02-13 DIAGNOSIS — L905 Scar conditions and fibrosis of skin: Secondary | ICD-10-CM | POA: Diagnosis not present

## 2022-02-19 ENCOUNTER — Other Ambulatory Visit: Payer: Self-pay | Admitting: Physician Assistant

## 2022-03-18 DIAGNOSIS — Z23 Encounter for immunization: Secondary | ICD-10-CM | POA: Diagnosis not present

## 2022-04-12 DIAGNOSIS — D649 Anemia, unspecified: Secondary | ICD-10-CM | POA: Diagnosis not present

## 2022-04-23 ENCOUNTER — Telehealth: Payer: Self-pay | Admitting: *Deleted

## 2022-04-23 DIAGNOSIS — I480 Paroxysmal atrial fibrillation: Secondary | ICD-10-CM

## 2022-04-23 MED ORDER — APIXABAN 5 MG PO TABS: 5.0000 mg | ORAL_TABLET | Freq: Two times a day (BID) | ORAL | 1 refills | Status: DC

## 2022-04-23 NOTE — Telephone Encounter (Signed)
Prescription refill request for Eliquis received. Indication:Afib  Last office visit: 02/05/22 [name])  Scr: 1,20 (02/01/22)  Age: 72 Weight: 96.7kg  Appropriate dose and refill sent to requested pharmacy.

## 2022-04-23 NOTE — Telephone Encounter (Signed)
Patient called the office requesting Eliquis refill. She stated "Pharmacy told her that it hadn't been filled". Please advise. Patient may be reached at [phone]. Thank you

## 2022-05-13 ENCOUNTER — Emergency Department (HOSPITAL_COMMUNITY): Payer: Medicare Other

## 2022-05-13 ENCOUNTER — Emergency Department (HOSPITAL_COMMUNITY)
Admission: EM | Admit: 2022-05-13 | Discharge: 2022-05-13 | Disposition: A | Discharge status: Home / Self Care | Payer: Medicare Other | Attending: Emergency Medicine | Admitting: Emergency Medicine

## 2022-05-13 ENCOUNTER — Other Ambulatory Visit: Payer: Self-pay

## 2022-05-13 ENCOUNTER — Encounter (HOSPITAL_COMMUNITY): Payer: Self-pay | Admitting: Pharmacy Technician

## 2022-05-13 DIAGNOSIS — Z86711 Personal history of pulmonary embolism: Secondary | ICD-10-CM | POA: Diagnosis not present

## 2022-05-13 DIAGNOSIS — I4891 Unspecified atrial fibrillation: Secondary | ICD-10-CM | POA: Diagnosis not present

## 2022-05-13 DIAGNOSIS — I499 Cardiac arrhythmia, unspecified: Secondary | ICD-10-CM | POA: Diagnosis not present

## 2022-05-13 DIAGNOSIS — R600 Localized edema: Secondary | ICD-10-CM | POA: Insufficient documentation

## 2022-05-13 DIAGNOSIS — Z79899 Other long term (current) drug therapy: Secondary | ICD-10-CM | POA: Diagnosis not present

## 2022-05-13 DIAGNOSIS — R079 Chest pain, unspecified: Secondary | ICD-10-CM | POA: Diagnosis not present

## 2022-05-13 DIAGNOSIS — R0789 Other chest pain: Secondary | ICD-10-CM | POA: Diagnosis not present

## 2022-05-13 DIAGNOSIS — Z87440 Personal history of urinary (tract) infections: Secondary | ICD-10-CM | POA: Insufficient documentation

## 2022-05-13 DIAGNOSIS — Z7901 Long term (current) use of anticoagulants: Secondary | ICD-10-CM | POA: Insufficient documentation

## 2022-05-13 DIAGNOSIS — R002 Palpitations: Secondary | ICD-10-CM | POA: Diagnosis not present

## 2022-05-13 LAB — CBC
HCT: 41.5 % (ref 36.0–46.0)
Hemoglobin: 13 g/dL (ref 12.0–15.0)
MCH: 30.2 pg (ref 26.0–34.0)
MCHC: 31.3 g/dL (ref 30.0–36.0)
MCV: 96.3 fL (ref 80.0–100.0)
Platelets: 322 10*3/uL (ref 150–400)
RBC: 4.31 MIL/uL (ref 3.87–5.11)
RDW: 14 % (ref 11.5–15.5)
WBC: 5.5 10*3/uL (ref 4.0–10.5)
nRBC: 0 % (ref 0.0–0.2)

## 2022-05-13 LAB — BASIC METABOLIC PANEL
Anion gap: 11 (ref 5–15)
BUN: 9 mg/dL (ref 8–23)
CO2: 23 mmol/L (ref 22–32)
Calcium: 9.7 mg/dL (ref 8.9–10.3)
Chloride: 103 mmol/L (ref 98–111)
Creatinine, Ser: 0.82 mg/dL (ref 0.44–1.00)
GFR, Estimated: >60 mL/min (ref >60)
Glucose, Bld: 111 mg/dL — ABNORMAL HIGH (ref 70–99)
Potassium: 3.9 mmol/L (ref 3.5–5.1)
Sodium: 137 mmol/L (ref 135–145)

## 2022-05-13 LAB — URINALYSIS, MICROSCOPIC (REFLEX): RBC / HPF: NONE SEEN RBC/hpf (ref 0–5)

## 2022-05-13 LAB — URINALYSIS, ROUTINE W REFLEX MICROSCOPIC
Bilirubin Urine: NEGATIVE
Glucose, UA: NEGATIVE mg/dL
Hgb urine dipstick: NEGATIVE
Ketones, ur: NEGATIVE mg/dL
Nitrite: NEGATIVE
Protein, ur: NEGATIVE mg/dL
Specific Gravity, Urine: 1.01 (ref 1.005–1.030)
pH: 5.5 (ref 5.0–8.0)

## 2022-05-13 LAB — BRAIN NATRIURETIC PEPTIDE: B Natriuretic Peptide: 237.1 pg/mL — ABNORMAL HIGH (ref 0.0–100.0)

## 2022-05-13 LAB — TROPONIN I (HIGH SENSITIVITY)
Troponin I (High Sensitivity): 4 ng/L (ref <18)
Troponin I (High Sensitivity): 4 ng/L (ref <18)

## 2022-05-13 LAB — MAGNESIUM: Magnesium: 2.1 mg/dL (ref 1.7–2.4)

## 2022-05-13 LAB — TSH: TSH: 1.733 u[IU]/mL (ref 0.350–4.500)

## 2022-05-13 MED ORDER — DILTIAZEM HCL-DEXTROSE 125-5 MG/125ML-% IV SOLN (PREMIX)
5.0000 mg/h | INTRAVENOUS | Status: DC
  Administered 2022-05-13: 5 mg/h via INTRAVENOUS
  Filled 2022-05-13: qty 125

## 2022-05-13 MED ORDER — ETOMIDATE 2 MG/ML IV SOLN
10.0000 mg | Freq: Once | INTRAVENOUS | Status: DC
  Filled 2022-05-13: qty 10

## 2022-05-13 MED ORDER — ETOMIDATE 2 MG/ML IV SOLN
INTRAVENOUS | Status: AC | PRN
  Administered 2022-05-13: 10 mg via INTRAVENOUS

## 2022-05-13 MED ORDER — SODIUM CHLORIDE 0.9 % IV BOLUS
500.0000 mL | Freq: Once | INTRAVENOUS | Status: AC
  Administered 2022-05-13: 500 mL via INTRAVENOUS

## 2022-05-13 NOTE — ED Provider Notes (Signed)
[facility] Provider Note   CSN: [id number] Arrival date & time: 05/13/22  0818     History  Chief Complaint  Patient presents with   Chest Pain    Nicole [name] is a 72 y.o. female.  73yo female brought in by EMS from home with concern for afib RVR. Reports history of same, is very consistent with her home medications.  Reports sudden onset at 8:30 PM last night took her medications as follows:  8:30pm onset 8:35pm diltiazem '40mg'$  10:30pm diltiazem '40mg'$  6:30am diltiazem '40mg'$  7:30am Eliquis, Tikosyn, Fe, Klonopin   States that her symptoms kept her up throughout the night.  She did develop some discomfort on the left side of her chest which moved down towards her waist and down her left arm as an aching pain in the left arm.  Continues to have this aching left arm pain today.  Was given 324 mg aspirin by EMS. Denies SHOB, lower extremity swelling, history of PE or DVT. States the last time she went into RVR was 01/2022 when she had a UTI she was not aware of or experiencing symptoms.        Home Medications Prior to Admission medications   Medication Sig Start Date End Date Taking? Authorizing Provider  acetaminophen (TYLENOL) 500 MG tablet Take 1,000 mg by mouth every 6 (six) hours as needed for mild pain or headache.   Yes [provider]  apixaban (ELIQUIS) 5 MG TABS tablet Take 1 tablet (5 mg total) by mouth 2 (two) times daily. 04/23/22  Yes Camnitz, Will Hassell Done, MD  Butalbital-APAP-Caffeine 50-300-40 MG CAPS Take 1 capsule by mouth 3 (three) times daily as needed (migraine). 04/17/22  Yes [provider]  butorphanol (STADOL) 10 MG/ML nasal spray Place 1 spray into the nose every 4 (four) hours as needed for headache or migraine.    Yes [provider]  cetirizine (ZYRTEC) 10 MG tablet Take 10 mg by mouth daily as needed for allergies or rhinitis.   Yes [provider]  clonazePAM (KLONOPIN) 1  MG tablet Take 1 mg by mouth 3 (three) times daily.   Yes [provider]  diltiazem (CARDIZEM CD) 180 MG 24 hr capsule Take 1 capsule (180 mg total) by mouth daily. Patient taking differently: Take 180 mg by mouth See admin instructions. 1400 02/05/22  Yes Fenton, Clint R, PA  diltiazem (CARDIZEM) 30 MG tablet Take 1 Tablet Every 4 Hours As Needed For HR >100 02/05/22  Yes Fenton, Clint R, PA  dofetilide (TIKOSYN) 500 MCG capsule Take 1 capsule (500 mcg total) by mouth 2 (two) times daily. 11/15/21  Yes Camnitz, Ocie Doyne, MD  ferrous sulfate 325 (65 FE) MG EC tablet Take 325 mg by mouth daily. 11/28/21  Yes [provider]  fluticasone-salmeterol (ADVAIR HFA) 115-21 MCG/ACT inhaler Inhale 2 puffs into the lungs 2 (two) times daily as needed (asthma).   Yes [provider]  furosemide (LASIX) 20 MG tablet TAKE 1 TABLET BY MOUTH EVERY DAY AS NEEDED FOR EDEMA Patient taking differently: Take 20 mg by mouth daily as needed for fluid or edema. 07/23/21  Yes Camnitz, Will Hassell Done, MD  KLOR-CON M20 20 MEQ tablet TAKE 1 TABLET BY MOUTH EVERY DAY Patient taking differently: Take 20 mEq by mouth daily. 11/28/21  Yes Camnitz, Will Hassell Done, MD  pravastatin (PRAVACHOL) 40 MG tablet Take 40 mg by mouth at bedtime.  11/30/18  Yes [provider]  promethazine (PHENERGAN) 25  MG tablet Take 25 mg by mouth every 6 (six) hours as needed for nausea or vomiting (from migraines).    Yes [provider]  sertraline (ZOLOFT) 50 MG tablet Take 50 mg by mouth at bedtime. 06/11/21  Yes [provider]  VENTOLIN HFA 108 (90 Base) MCG/ACT inhaler Inhale 2 puffs into the lungs every 6 (six) hours as needed for wheezing or shortness of breath.   Yes [provider]      Allergies    Demerol [meperidine hcl], Pneumococcal vaccines, Shingrix [zoster vac recomb adjuvanted], Other, Meperidine, and Prednisone    Review of Systems   Review of Systems Negative except as per  HPI Physical Exam Updated Vital Signs BP 122/75   Pulse 63   Temp 97.8 F (36.6 C) (Oral)   Resp 20   SpO2 100%  Physical Exam Vitals and nursing note reviewed.  Constitutional:      General: She is not in acute distress.    Appearance: She is well-developed. She is not diaphoretic.  HENT:     Head: Normocephalic and atraumatic.  Cardiovascular:     Rate and Rhythm: Tachycardia present. Rhythm irregular.     Heart sounds: Normal heart sounds.  Pulmonary:     Effort: Pulmonary effort is normal.     Breath sounds: Normal breath sounds.  Chest:     Chest wall: No tenderness.  Abdominal:     Palpations: Abdomen is soft.     Tenderness: There is no abdominal tenderness.  Musculoskeletal:     Right lower leg: No tenderness. Edema present.     Left lower leg: No tenderness. Edema present.  Skin:    General: Skin is warm and dry.     Findings: No erythema or rash.  Neurological:     Mental Status: She is alert and oriented to person, place, and time.  Psychiatric:        Behavior: Behavior normal.     ED Results / Procedures / Treatments   Labs (all labs ordered are listed, but only abnormal results are displayed) Labs Reviewed  BASIC METABOLIC PANEL - Abnormal; Notable for the following components:      Result Value   Glucose, Bld 111 (*)    All other components within normal limits  URINALYSIS, ROUTINE W REFLEX MICROSCOPIC - Abnormal; Notable for the following components:   Leukocytes,Ua SMALL (*)    All other components within normal limits  BRAIN NATRIURETIC PEPTIDE - Abnormal; Notable for the following components:   B Natriuretic Peptide 237.1 (*)    All other components within normal limits  URINALYSIS, MICROSCOPIC (REFLEX) - Abnormal; Notable for the following components:   Bacteria, UA RARE (*)    All other components within normal limits  URINE CULTURE  CBC  TSH  MAGNESIUM  TROPONIN I (HIGH SENSITIVITY)  TROPONIN I (HIGH SENSITIVITY)    EKG EKG  Interpretation  Date/Time:  Monday May 13 2022 08:25:20 EST Ventricular Rate:  117 PR Interval:    QRS Duration: 85 QT Interval:  322 QTC Calculation: 450 R Axis:   70 Text Interpretation: Atrial fibrillation Paired ventricular premature complexes Minimal ST depression, inferior leads when compared to prior, appears to show afib No STEMI Confirmed by Antony Blackbird [phone]) on 05/13/2022 8:32:54 AM  Radiology DG Chest Port 1 View  Result Date: 05/13/2022 CLINICAL DATA:  Chest pain EXAM: PORTABLE CHEST 1 VIEW COMPARISON:  02/01/2022 FINDINGS: Loop recorder device overlies the left chest. The heart size and mediastinal  contours are within normal limits. Aortic atherosclerosis. Both lungs are clear. The visualized skeletal structures are unremarkable. IMPRESSION: No active disease. Electronically Signed   By: Davina Poke D.O.   On: 05/13/2022 09:07    Procedures .Critical Care  Performed by: Tacy Learn, PA-C Authorized by: Tacy Learn, PA-C   Critical care provider statement:    Critical care time (minutes):  30   Critical care was time spent personally by me on the following activities:  Development of treatment plan with patient or surrogate, discussions with consultants, evaluation of patient's response to treatment, examination of patient, ordering and review of laboratory studies, ordering and review of radiographic studies, ordering and performing treatments and interventions, pulse oximetry, re-evaluation of patient's condition and review of old charts     Medications Ordered in ED Medications  diltiazem (CARDIZEM) 125 mg in dextrose 5% 125 mL (1 mg/mL) infusion (5 mg/hr Intravenous New Bag/Given 05/13/22 0929)  etomidate (AMIDATE) injection 10 mg (has no administration in time range)  sodium chloride 0.9 % bolus 500 mL (0 mLs Intravenous Stopped 05/13/22 1050)  etomidate (AMIDATE) injection (10 mg Intravenous Given 05/13/22 1336)    ED Course/ Medical Decision  Making/ A&P Clinical Course as of 05/13/22 1454  Mon May 13, 2022  0840 8:30pm onset 8:35pm diltiazem '40mg'$  10:30pm diltiazem '40mg'$  6:30am diltiazem '40mg'$  7:30am Eliquis, Tikosyn, Fe, Klonopin  [LM]    Clinical Course User Index [LM] Roque Lias                             Medical Decision Making Amount and/or Complexity of Data Reviewed Labs: ordered. Radiology: ordered.  Risk Prescription drug management.   This patient presents to the ED for concern of palpitations, chest discomfort, this involves an extensive number of treatment options, and is a complaint that carries with it a high risk of complications and morbidity.  The differential diagnosis includes arrhythmia, ACS, metabolic disturbance   Co morbidities that complicate the patient evaluation  A-fib, anticoagulated, hyperlipidemia   Additional history obtained:  Additional history obtained from spouse at bedside who contributes to history as above External records from outside source obtained and reviewed including prior visit to A-fib clinic dated 02/05/2022 following ER visit for A-fib RVR   Lab Tests:  I Ordered, and personally interpreted labs.  The pertinent results include: Troponins unchanged at 4 and 4.  TSH within normal limits.  BNP mildly elevated to 37.  Magnesium normal.  CBC unremarkable.  BMP without significant findings.  Urinalysis with small leukocytes, no significant WBC, rare bacteria.  Given patient's history of prior UTI thought to have caused her RVR episode, will add culture.   Imaging Studies ordered:  I ordered imaging studies including chest x-ray I independently visualized and interpreted imaging which showed no acute process I agree with the radiologist interpretation   Cardiac Monitoring: / EKG:  The patient was maintained on a cardiac monitor.  I personally viewed and interpreted the cardiac monitored which showed an underlying rhythm of: A-fib, rate  117   Consultations Obtained:  I requested consultation with the cardiology, Dr. Johney Frame,  and discussed lab and imaging findings as well as pertinent plan - they recommend: Cardiovert   Problem List / ED Course / Critical interventions / Medication management  72 year old female with history of A-fib presents with rate around 115-125.  Has managed at home with medications as above without control.  Started on  diltiazem drip with some improvement however frequently with increase in rate with the slightest bit of movement or exertion.  Patient has not missed any doses of her medications and reports strict compliance with her regimen.  Labs are reassuring.  Case discussed with ER attending and cardiologist, plan for cardioversion.  Cardioversion as managed with ER attending Dr. Timothy Lasso.  Patient has past observation period, is tolerating p.o. fluids and ready for discharge. I ordered medication including diltiazem for rate control Reevaluation of the patient after these medicines showed that the patient  somewhat improved, rates with drop into the upper 90s and then quickly returned to 115+ I have reviewed the patients home medicines and have made adjustments as needed   Social Determinants of Health:  Has outpatient specialty follow-up   Test / Admission - Considered:  Treated in the emergency room for A-fib RVR with cardioversion successfully.  Patient stable during monitoring period and ready for discharge for outpatient follow-up with A-fib clinic         Final Clinical Impression(s) / ED Diagnoses Final diagnoses:  Atrial fibrillation with RVR [facility])    Rx / DC Orders ED Discharge Orders     None         Tacy Learn, PA-C 05/13/22 1454    Tegeler, Gwenyth Allegra, MD 05/14/22 [phone]

## 2022-05-13 NOTE — ED Provider Notes (Signed)
.  Cardioversion  Date/Time: 05/13/2022 4:01 PM  Performed by: Courtney Paris, MD Authorized by: Courtney Paris, MD   Consent:    Consent obtained:  Written   Consent given by:  Patient   Risks discussed:  Cutaneous burn, death and induced arrhythmia   Alternatives discussed:  No treatment Pre-procedure details:    Cardioversion basis:  Emergent   Rhythm:  Atrial fibrillation   Electrode placement:  Anterior-posterior Patient sedated: Yes. Refer to sedation procedure documentation for details of sedation.  Attempt one:    Cardioversion mode:  Synchronous   Waveform:  Biphasic   Shock (Joules):  150   Shock outcome:  Conversion to normal sinus rhythm Post-procedure details:    Patient status:  Awake   Patient tolerance of procedure:  Tolerated well, no immediate complications .Sedation  Date/Time: 05/13/2022 4:01 PM  Performed by: Courtney Paris, MD Authorized by: Courtney Paris, MD   Consent:    Consent obtained:  Written   Consent given by:  Patient   Risks discussed:  Allergic reaction, dysrhythmia, inadequate sedation and nausea Universal protocol:    Immediately prior to procedure, a time out was called: yes     Patient identity confirmed:  Anonymous protocol, patient vented/unresponsive Indications:    Procedure performed:  Cardioversion   Procedure necessitating sedation performed by:  Physician performing sedation Pre-sedation assessment:    Time since last food or drink:  Hours   ASA classification: class 3 - patient with severe systemic disease     Mouth opening:  3 or more finger widths   Thyromental distance:  4 finger widths   Mallampati score:  I - soft palate, uvula, fauces, pillars visible   Neck mobility: normal     Pre-sedation assessments completed and reviewed: pre-procedure mental status not reviewed and pre-procedure respiratory function not reviewed   Immediate pre-procedure details:    Reassessment: Patient  reassessed immediately prior to procedure     Reviewed: vital signs and relevant labs/tests     Verified: bag valve mask available, emergency equipment available, intubation equipment available, IV patency confirmed, oxygen available and suction available   Procedure details (see MAR for exact dosages):    Preoxygenation:  Nasal cannula   Sedation:  Etomidate   Intended level of sedation: deep   Intra-procedure monitoring:  Blood pressure monitoring, cardiac monitor, continuous capnometry, continuous pulse oximetry, frequent LOC assessments and frequent vital sign checks   Total Provider sedation time (minutes):  25 Post-procedure details:    Attendance: Constant attendance by certified staff until patient recovered     Recovery: Patient returned to pre-procedure baseline     Patient is stable for discharge or admission: yes     Procedure completion:  Tolerated well, no immediate complications     Steed Kanaan, Gwenyth Allegra, MD 05/13/22 [phone]

## 2022-05-13 NOTE — ED Triage Notes (Signed)
L sided chest pain onset yesterday. Hx of same.  '324mg'$  aspirin given. Pt took cardizem at 0630 today.  138/72 130-150 afib 98% RA

## 2022-05-13 NOTE — ED Notes (Signed)
Patient Alert and oriented to baseline. Stable and ambulatory to baseline. Patient verbalized understanding of the discharge instructions.  Patient belongings were taken by the patient.   

## 2022-05-13 NOTE — ED Notes (Signed)
Placed zoll pads patient is resting with call bell in reach

## 2022-05-13 NOTE — Progress Notes (Signed)
Attended Cardioversion with MD and RNS at bedside.  Cardioversion was successful.

## 2022-05-13 NOTE — Discharge Instructions (Addendum)
Continue with your medications.  Follow-up with your cardiologist.  Return to the ER for worsening or concerning symptoms.  Do not have an obvious urinary tract infection today however a culture has been sent out because of your history.

## 2022-05-13 NOTE — Sedation Documentation (Signed)
Cardioverted at 150j synchronized

## 2022-05-13 NOTE — ED Notes (Signed)
Help get patient into a gown on the monitor did EKG shown to er provider patient is resting with call bell in reach

## 2022-05-15 LAB — URINE CULTURE: Culture: <10000 — AB

## 2022-05-17 ENCOUNTER — Telehealth: Payer: Self-pay

## 2022-05-17 NOTE — Telephone Encounter (Signed)
     Patient  visit on 05/13/2022  at [facility]. [facility] was for Atrial fibrillation with RVR.  Have you been able to follow up with your primary care physician? Patient has an appointment with her Atrial Fib physician.  The patient was or was not able to obtain any needed medicine or equipment. No medication prescribed.  Are there diet recommendations that you are having difficulty following? No  Patient expresses understanding of discharge instructions and education provided has no other needs at this time.    [facility] Guide   [email or web address]  ?? [phone]   Website: [email or web address]  [email or web address]

## 2022-05-21 ENCOUNTER — Ambulatory Visit (HOSPITAL_COMMUNITY)
Admission: RE | Admit: 2022-05-21 | Discharge: 2022-05-21 | Disposition: A | Discharge status: Home / Self Care | Payer: Medicare Other | Source: Ambulatory Visit | Attending: Physician Assistant | Admitting: Physician Assistant

## 2022-05-21 ENCOUNTER — Encounter (HOSPITAL_COMMUNITY): Payer: Self-pay | Admitting: Physician Assistant

## 2022-05-21 VITALS — BP 148/90 | HR 98 | Ht 68.0 in | Wt 203.2 lb

## 2022-05-21 DIAGNOSIS — Z79899 Other long term (current) drug therapy: Secondary | ICD-10-CM | POA: Diagnosis not present

## 2022-05-21 DIAGNOSIS — I10 Essential (primary) hypertension: Secondary | ICD-10-CM | POA: Insufficient documentation

## 2022-05-21 DIAGNOSIS — Z95818 Presence of other cardiac implants and grafts: Secondary | ICD-10-CM | POA: Diagnosis not present

## 2022-05-21 DIAGNOSIS — I491 Atrial premature depolarization: Secondary | ICD-10-CM | POA: Diagnosis not present

## 2022-05-21 DIAGNOSIS — D6869 Other thrombophilia: Secondary | ICD-10-CM | POA: Diagnosis not present

## 2022-05-21 DIAGNOSIS — Z862 Personal history of diseases of the blood and blood-forming organs and certain disorders involving the immune mechanism: Secondary | ICD-10-CM | POA: Insufficient documentation

## 2022-05-21 DIAGNOSIS — Z87440 Personal history of urinary (tract) infections: Secondary | ICD-10-CM | POA: Diagnosis not present

## 2022-05-21 DIAGNOSIS — Z7901 Long term (current) use of anticoagulants: Secondary | ICD-10-CM | POA: Insufficient documentation

## 2022-05-21 DIAGNOSIS — I480 Paroxysmal atrial fibrillation: Secondary | ICD-10-CM | POA: Insufficient documentation

## 2022-05-21 MED ORDER — DILTIAZEM HCL ER COATED BEADS 240 MG PO CP24: 240.0000 mg | ORAL_CAPSULE | Freq: Every day | ORAL | 3 refills | Status: DC

## 2022-05-21 MED ORDER — DILTIAZEM HCL 30 MG PO TABS: ORAL_TABLET | ORAL | 1 refills | Status: DC

## 2022-05-21 NOTE — Progress Notes (Signed)
Primary Care Physician: Harvie Junior, MD Referring Physician: Dr. Rayann Heman Primary EP: Dr Emogene Morgan is a 72 y.o. adult with a h/o anemia,  afib, and syncope possibly 2/2 termination pause  that presented to his office with afib with RVR at 160 bpm and was admitted for Tikosyn load. Converted to SR without pause. Qtc remained stable. She is on Eliquis for a CHADS2VASC score of 3. She is s/p repeat afib ablation with Dr Rayann Heman on 03/14/20. Patient had tachypalpitations on 02/27/21 and took one of her PRN diltiazem which did not resolve her symptoms. EMS was called and ECG conformed afib with RVR. She was given IV diltiazem which converted her to SR. Patient does report that he niece has passed away the day before the onset of her symptoms. She has had 3 episodes of afib since the start of 2023 and went to the ED 05/09/21. She spontaneously converted en route. She was seen by Dr Curt Bears 06/18/21 and her BB was increased.   At her visit 11/20/21, patient reported that she had been feeling "terrible". She becomes very SOB with minimal exertion. CBC showed severe anemia with Hgb 5.2, she was sent to the ED for evaluation and transfusion, given 2 units PRBCs. Patient underwent EGD/pill endoscopy and colonoscopy. Eliquis was held initially due to suspected GI bleed. Dr. Lyndel Safe updated the patient on the results of her pill endoscopy on the afternoon of 11/23/2021 noting two areas of nonactive bleeding in her small intestine that could explain her positive FOBT. Patient reports that she felt better almost immediately after her first transfusion.   Patient was seen at the ED 02/01/22 with rapid afib in the setting of a UTI. She woke that morning with palpitations and checked her Kardia mobile and found to be A-fib RVR with rate in the low 200s.  She took a dose of Cardizem 30 mg at 1 AM and a repeat dose of Cardizem 30 mg at 5 in the morning and also took her home Eliquis dose and Tikosyn dose.  When  EMS got to her, she was A-fib RVR 120-140s bpm. She was back in SR on arrival to the ED.   On follow up today, patient was again seen at the ED for afib with RVR and underwent DCCV on 05/13/22. There were no specific triggers that she could identify. No current bleeding issues on anticoagulation. Hgb 13.0.  Today, she denies symptoms of palpitations, CP, orthopnea, PND, dizziness, presyncope, syncope, or neurologic sequela.  otherwise without complaint today.   Past Medical History:  Diagnosis Date   Anticoagulant long-term use 11/16/2014   Anxiety 10/06/2017   Arthritis 10/06/2017   knees bilateral   Asthma 11/16/2014   Esophageal reflux 11/16/2014   Essential hypertension 08/15/2015   pt denies   H/O: GI bleed 12/11/2016   Hyperlipidemia 11/17/2014   Migraine headache 11/16/2014   Nausea 11/16/2014   Pain in limb 11/16/2014   Panic disorder 11/16/2014   Paroxysmal atrial fibrillation ([place]) 11/17/2014   Swelling of joint 11/16/2014   Symptomatic anemia 11/24/2016   Tendonitis 11/16/2014   UTI (urinary tract infection) 12/19/2015   Past Surgical History:  Procedure Laterality Date   ABDOMINAL HYSTERECTOMY     ATRIAL FIBRILLATION ABLATION N/A 03/14/2020   Procedure: ATRIAL FIBRILLATION ABLATION;  Surgeon: [name], MD;  Location: [facility];  Service: Cardiovascular;  Laterality: N/A;   BIOPSY  11/22/2021   Procedure: BIOPSY;  Surgeon: Daryel November, MD;  Location:  [facility];  Service: Gastroenterology;;   CARDIOVERSION     CESAREAN SECTION     COLONOSCOPY     COLONOSCOPY N/A 11/22/2021   Procedure: COLONOSCOPY;  Surgeon: Daryel November, MD;  Location: [facility];  Service: Gastroenterology;  Laterality: N/A;   ESOPHAGOGASTRODUODENOSCOPY (EGD) WITH PROPOFOL N/A 11/22/2021   Procedure: ESOPHAGOGASTRODUODENOSCOPY (EGD) WITH PROPOFOL;  Surgeon: Daryel November, MD;  Location: [facility];  Service: Gastroenterology;  Laterality: N/A;   GIVENS CAPSULE STUDY N/A 11/22/2021    Procedure: GIVENS CAPSULE STUDY;  Surgeon: Daryel November, MD;  Location: [facility];  Service: Gastroenterology;  Laterality: N/A;   LOOP RECORDER INSERTION N/A 02/04/2018   Procedure: LOOP RECORDER INSERTION;  Surgeon: [name], MD;  Location: [facility];  Service: Cardiovascular;  Laterality: N/A;   POLYPECTOMY  11/22/2021   Procedure: POLYPECTOMY;  Surgeon: Daryel November, MD;  Location: [facility];  Service: Gastroenterology;;   ROTATOR CUFF REPAIR Left     Current Outpatient Medications  Medication Sig Dispense Refill   acetaminophen (TYLENOL) 500 MG tablet Take 1,000 mg by mouth every 6 (six) hours as needed for mild pain or headache.     apixaban (ELIQUIS) 5 MG TABS tablet Take 1 tablet (5 mg total) by mouth 2 (two) times daily. 180 tablet 1   Butalbital-APAP-Caffeine 50-300-40 MG CAPS Take 1 capsule by mouth 3 (three) times daily as needed (migraine).     butorphanol (STADOL) 10 MG/ML nasal spray Place 1 spray into the nose every 4 (four) hours as needed for headache or migraine.      cetirizine (ZYRTEC) 10 MG tablet Take 10 mg by mouth daily as needed for allergies or rhinitis.     clonazePAM (KLONOPIN) 1 MG tablet Take 1 mg by mouth 3 (three) times daily.     dofetilide (TIKOSYN) 500 MCG capsule Take 1 capsule (500 mcg total) by mouth 2 (two) times daily. 180 capsule 2   ferrous sulfate 325 (65 FE) MG EC tablet Take 325 mg by mouth daily.     fluticasone-salmeterol (ADVAIR HFA) 115-21 MCG/ACT inhaler Inhale 2 puffs into the lungs 2 (two) times daily as needed (asthma).     furosemide (LASIX) 20 MG tablet TAKE 1 TABLET BY MOUTH EVERY DAY AS NEEDED FOR EDEMA (Patient taking differently: Take 20 mg by mouth daily as needed for fluid or edema.) 90 tablet 3   KLOR-CON M20 20 MEQ tablet TAKE 1 TABLET BY MOUTH EVERY DAY (Patient taking differently: Take 20 mEq by mouth daily.) 90 tablet 3   pravastatin (PRAVACHOL) 40 MG tablet Take 40 mg by mouth at bedtime.       promethazine (PHENERGAN) 25 MG tablet Take 25 mg by mouth every 6 (six) hours as needed for nausea or vomiting (from migraines).      sertraline (ZOLOFT) 50 MG tablet Take 50 mg by mouth at bedtime.     VENTOLIN HFA 108 (90 Base) MCG/ACT inhaler Inhale 2 puffs into the lungs every 6 (six) hours as needed for wheezing or shortness of breath.     diltiazem (CARDIZEM CD) 240 MG 24 hr capsule Take 1 capsule (240 mg total) by mouth daily. 30 capsule 3   diltiazem (CARDIZEM) 30 MG tablet Take 1 Tablet Every 4 Hours As Needed For HR >100 45 tablet 1   No current facility-administered medications for this encounter.    Allergies  Allergen Reactions   Demerol [Meperidine Hcl]     migraine   Pneumococcal Vaccines Other (  See Comments)    Went into A-Fib immediately after receiving this    Shingrix [Zoster Vac Recomb Adjuvanted] Other (See Comments)    Went into A-Fib immediately after receiving this    Other Other (See Comments)    Anesthesia- Takes a lot to anesthetize the patient   Meperidine Nausea And Vomiting and Other (See Comments)    States she "About passed out," also   Prednisone Palpitations and Other (See Comments)    Tachycardia     Social History   Socioeconomic History   Marital status: Married    Spouse name: Not on file   Number of children: Not on file   Years of education: Not on file   Highest education level: Not on file  Occupational History   Not on file  Tobacco Use   Smoking status: Never   Smokeless tobacco: Never   Tobacco comments:    Never smoke 07/26/21  Vaping Use   Vaping Use: Never used  Substance and Sexual Activity   Alcohol use: Never   Drug use: Never   Sexual activity: Not on file  Other Topics Concern   Not on file  Social History Narrative   Lives in [place] with spouse   Retired from school system.   Social Determinants of Health   Financial Resource Strain: Not on file  Food Insecurity: Not on file  Transportation Needs: No  Transportation Needs (11/26/2021)   PRAPARE - Hydrologist (Medical): No    Lack of Transportation (Non-Medical): No  Physical Activity: Not on file  Stress: Not on file  Social Connections: Not on file  Intimate Partner Violence: Not on file    Family History  Problem Relation Age of Onset   Heart attack Mother    Heart disease Mother    Heart disease Sister    Heart disease Sister    Heart disease Sister    Heart disease Sister    Heart disease Sister     ROS- All systems are reviewed and negative except as per the HPI above  Physical Exam: Vitals:   05/21/22 0859  BP: (!) 148/90  Pulse: 98  Weight: 92.2 kg  Height: '5\' 8"'$  (1.727 m)     Wt Readings from Last 3 Encounters:  05/21/22 92.2 kg  02/05/22 96.7 kg  12/06/21 97.5 kg    Labs: Lab Results  Component Value Date   NA 137 05/13/2022   K 3.9 05/13/2022   CL 103 05/13/2022   CO2 23 05/13/2022   GLUCOSE 111 (H) 05/13/2022   BUN 9 05/13/2022   CREATININE 0.82 05/13/2022   CALCIUM 9.7 05/13/2022   MG 2.1 05/13/2022   Lab Results  Component Value Date   INR 1.5 (H) 11/20/2021   No results found for: "CHOL", "HDL", "LDLCALC", "TRIG"   GEN- The patient is a well appearing female, alert and oriented x 3 today.   HEENT-head normocephalic, atraumatic, sclera clear, conjunctiva pink, hearing intact, trachea midline. Lungs- Clear to ausculation bilaterally, normal work of breathing Heart- Regular rate and rhythm, no murmurs, rubs or gallops  GI- soft, NT, ND, + BS Extremities- no clubbing, cyanosis, or edema MS- no significant deformity or atrophy Skin- no rash or lesion Psych- euthymic mood, full affect Neuro- strength and sensation are intact   EKG-  SR, PAC Vent. rate 98 BPM PR interval 180 ms QRS duration 86 ms QT/QTcB 342/436 ms   Echo 12/06/21 demonstrated   1. Left ventricular  ejection fraction, by estimation, is 60 to 65%. The  left ventricle has normal  function. The left ventricle has no regional  wall motion abnormalities. There is mild asymmetric left ventricular  hypertrophy of the septal segment. Left ventricular diastolic parameters are consistent with Grade II diastolic dysfunction (pseudonormalization). Elevated left ventricular end-diastolic pressure.   2. Right ventricular systolic function is normal. The right ventricular  size is normal. There is normal pulmonary artery systolic pressure.   3. The mitral valve is normal in structure. Trivial mitral valve  regurgitation. No evidence of mitral stenosis.   4. The aortic valve is tricuspid. Aortic valve regurgitation is trivial.  No aortic stenosis is present.   5. Aortic dilatation noted. There is mild dilatation of the aortic root,  measuring 38 mm. There is borderline dilatation of the ascending aorta,  measuring 36 mm.   6. The inferior vena cava is normal in size with greater than 50%  respiratory variability, suggesting right atrial pressure of 3 mmHg.    Epic records reviewed   CHA2DS2-VASc Score = 3  The patient's score is based upon: CHF History: 0 HTN History: 1 Diabetes History: 0 Stroke History: 0 Vascular Disease History: 0 Age Score: 1 Gender Score: 1        ASSESSMENT AND PLAN: 1. Paroxysmal Atrial Fibrillation (ICD10:  I48.0) The patient's CHA2DS2-VASc score is 3, indicating a 3.2% annual risk of stroke.   S/p repeat ablation with Dr Rayann Heman 03/14/20 [facility] at [facility]. She is unsure if she wants it explanted. She may benefit from having a new one placed.  S/p DCCV in ED on 05/13/22 We discussed rhythm control options today. With two recent ED visits for afib she may be failing dofetilide. We discussed changing AAD to amiodarone. She had been on this years ago prior to her first ablation and does not want to go back on it stating "it does not work for me". We also discussed repeat ablation but it is unclear how effective this would be with "multiple flutter  circuits not suitable for ablation" noted her her previous ablation. ? If she would be a candidate for convergent. Will refer back to EP to discuss options.  Continue dofetilide 500 mcg BID. QT stable.  Increase diltiazem to 240 mg daily with 30 mg PRN q 4hrs for heart racing. Continue Eliquis 5 mg BID    2. Secondary Hypercoagulable State (ICD10:  D68.69) The patient is at significant risk for stroke/thromboembolism based upon her CHA2DS2-VASc Score of 3.  Continue Apixaban (Eliquis). If she has recurrent anemia, could consider referral for [facility].   3. HTN Stable, med changes as above.   Follow up with EP to discuss rhythm control options.     [facility] [address] [place], [place] [address] [phone]

## 2022-05-21 NOTE — Patient Instructions (Signed)
Increase cardizem to 240mg once a day   

## 2022-05-22 ENCOUNTER — Telehealth: Payer: Self-pay | Admitting: Cardiology

## 2022-05-22 NOTE — Telephone Encounter (Signed)
New message  Juluis Mire, RN  Oletta Lamas, [place] P Pt is current patient of [facility] - requesting switch either [facility]or and pt needs to see them soon to discuss afib management per ricky. Thanks stacy  Is this switch approved?

## 2022-05-29 ENCOUNTER — Encounter (HOSPITAL_COMMUNITY): Payer: Self-pay | Admitting: Physician Assistant

## 2022-05-29 ENCOUNTER — Telehealth (HOSPITAL_COMMUNITY): Payer: Self-pay

## 2022-05-29 ENCOUNTER — Ambulatory Visit (HOSPITAL_COMMUNITY)
Admission: RE | Admit: 2022-05-29 | Discharge: 2022-05-29 | Disposition: A | Discharge status: Home / Self Care | Payer: Medicare Other | Source: Ambulatory Visit | Attending: Physician Assistant | Admitting: Physician Assistant

## 2022-05-29 VITALS — BP 118/98 | HR 88 | Ht 68.0 in | Wt 204.8 lb

## 2022-05-29 DIAGNOSIS — D649 Anemia, unspecified: Secondary | ICD-10-CM | POA: Insufficient documentation

## 2022-05-29 DIAGNOSIS — I480 Paroxysmal atrial fibrillation: Secondary | ICD-10-CM | POA: Diagnosis not present

## 2022-05-29 DIAGNOSIS — D6869 Other thrombophilia: Secondary | ICD-10-CM | POA: Insufficient documentation

## 2022-05-29 DIAGNOSIS — Z7901 Long term (current) use of anticoagulants: Secondary | ICD-10-CM | POA: Insufficient documentation

## 2022-05-29 DIAGNOSIS — I10 Essential (primary) hypertension: Secondary | ICD-10-CM | POA: Diagnosis not present

## 2022-05-29 MED ORDER — DILTIAZEM HCL 30 MG PO TABS: ORAL_TABLET | ORAL | 1 refills | Status: DC

## 2022-05-29 NOTE — Telephone Encounter (Signed)
Patient called stating she went into Afib early this morning. Ricky increased her daily Diltiazem from 120 to '240mg'$ . She stated she took her Diltazem '30mg'$  when the afib started and she wanted 2 hours before taking it again. BP 143/97 and HR 137 little swelling, lighthead, dizzy, fatigue, SHOB, and HR. He suggested that she waits until lunch time to take another Diltiazem '30mg'$  and put her on the schedule today at 3:30. She stated that she couldn't wait that long and Ricky suggested that she goes to the ED if she feels like she can't make it until 3:30

## 2022-05-29 NOTE — Progress Notes (Signed)
Primary Care Physician: Harvie Junior, MD Referring Physician: Dr. Rayann Heman Primary EP: Dr Emogene Morgan is a 72 y.o. adult with a h/o anemia,  afib, and syncope possibly 2/2 termination pause  that presented to his office with afib with RVR at 160 bpm and was admitted for Tikosyn load. Converted to SR without pause. Qtc remained stable. She is on Eliquis for a CHADS2VASC score of 3. She is s/p repeat afib ablation with Dr Rayann Heman on 03/14/20. Patient had tachypalpitations on 02/27/21 and took one of her PRN diltiazem which did not resolve her symptoms. EMS was called and ECG conformed afib with RVR. She was given IV diltiazem which converted her to SR. Patient does report that he niece has passed away the day before the onset of her symptoms. She has had 3 episodes of afib since the start of 2023 and went to the ED 05/09/21. She spontaneously converted en route. She was seen by Dr Curt Bears 06/18/21 and her BB was increased.   At her visit 11/20/21, patient reported that she had been feeling "terrible". She becomes very SOB with minimal exertion. CBC showed severe anemia with Hgb 5.2, she was sent to the ED for evaluation and transfusion, given 2 units PRBCs. Patient underwent EGD/pill endoscopy and colonoscopy. Eliquis was held initially due to suspected GI bleed. Dr. Lyndel Safe updated the patient on the results of her pill endoscopy on the afternoon of 11/23/2021 noting two areas of nonactive bleeding in her small intestine that could explain her positive FOBT. Patient reports that she felt better almost immediately after her first transfusion.   Patient was seen at the ED 02/01/22 with rapid afib in the setting of a UTI. She woke that morning with palpitations and checked her Kardia mobile and found to be A-fib RVR with rate in the low 200s.  She took a dose of Cardizem 30 mg at 1 AM and a repeat dose of Cardizem 30 mg at 5 in the morning and also took her home Eliquis dose and Tikosyn dose.  When  EMS got to her, she was A-fib RVR 120-140s bpm. She was back in SR on arrival to the ED.   Patient was again seen at the ED for afib with RVR and underwent DCCV on 05/13/22. There were no specific triggers that she could identify. No current bleeding issues on anticoagulation. Hgb 13.0.  On follow up today, patient called the clinic this AM with tachypalpitations. She felt she was out of rhythm just before 5 AM. She took two doses of her PRN diltiazem and converted to SR around 11 AM. There were no specific triggers that she could identify. She had a negative sleep study ~5 years ago.  Today, she denies symptoms of CP, orthopnea, PND, dizziness, presyncope, syncope, or neurologic sequela.  otherwise without complaint today.   Past Medical History:  Diagnosis Date   Anticoagulant long-term use 11/16/2014   Anxiety 10/06/2017   Arthritis 10/06/2017   knees bilateral   Asthma 11/16/2014   Esophageal reflux 11/16/2014   Essential hypertension 08/15/2015   pt denies   H/O: GI bleed 12/11/2016   Hyperlipidemia 11/17/2014   Migraine headache 11/16/2014   Nausea 11/16/2014   Pain in limb 11/16/2014   Panic disorder 11/16/2014   Paroxysmal atrial fibrillation ([place]) 11/17/2014   Swelling of joint 11/16/2014   Symptomatic anemia 11/24/2016   Tendonitis 11/16/2014   UTI (urinary tract infection) 12/19/2015   Past Surgical History:  Procedure Laterality Date  ABDOMINAL HYSTERECTOMY     ATRIAL FIBRILLATION ABLATION N/A 03/14/2020   Procedure: ATRIAL FIBRILLATION ABLATION;  Surgeon: [name], MD;  Location: [place] [facility];  Service: Cardiovascular;  Laterality: N/A;   BIOPSY  11/22/2021   Procedure: BIOPSY;  Surgeon: Daryel November, MD;  Location: [facility] ENDOSCOPY;  Service: Gastroenterology;;   CARDIOVERSION     CESAREAN SECTION     COLONOSCOPY     COLONOSCOPY N/A 11/22/2021   Procedure: COLONOSCOPY;  Surgeon: Daryel November, MD;  Location: [place];  Service: Gastroenterology;  Laterality:  N/A;   ESOPHAGOGASTRODUODENOSCOPY (EGD) WITH PROPOFOL N/A 11/22/2021   Procedure: ESOPHAGOGASTRODUODENOSCOPY (EGD) WITH PROPOFOL;  Surgeon: Daryel November, MD;  Location: [place];  Service: Gastroenterology;  Laterality: N/A;   GIVENS CAPSULE STUDY N/A 11/22/2021   Procedure: GIVENS CAPSULE STUDY;  Surgeon: Daryel November, MD;  Location: [place];  Service: Gastroenterology;  Laterality: N/A;   LOOP RECORDER INSERTION N/A 02/04/2018   Procedure: LOOP RECORDER INSERTION;  Surgeon: [name], MD;  Location: [place] [facility];  Service: Cardiovascular;  Laterality: N/A;   POLYPECTOMY  11/22/2021   Procedure: POLYPECTOMY;  Surgeon: Daryel November, MD;  Location: [facility] ENDOSCOPY;  Service: Gastroenterology;;   ROTATOR CUFF REPAIR Left     Current Outpatient Medications  Medication Sig Dispense Refill   acetaminophen (TYLENOL) 500 MG tablet Take 1,000 mg by mouth every 6 (six) hours as needed for mild pain or headache.     apixaban (ELIQUIS) 5 MG TABS tablet Take 1 tablet (5 mg total) by mouth 2 (two) times daily. 180 tablet 1   Butalbital-APAP-Caffeine 50-300-40 MG CAPS Take 1 capsule by mouth 3 (three) times daily as needed (migraine).     butorphanol (STADOL) 10 MG/ML nasal spray Place 1 spray into the nose every 4 (four) hours as needed for headache or migraine.      cetirizine (ZYRTEC) 10 MG tablet Take 10 mg by mouth daily as needed for allergies or rhinitis.     clonazePAM (KLONOPIN) 1 MG tablet Take 1 mg by mouth 3 (three) times daily.     diltiazem (CARDIZEM CD) 240 MG 24 hr capsule Take 1 capsule (240 mg total) by mouth daily. 30 capsule 3   dofetilide (TIKOSYN) 500 MCG capsule Take 1 capsule (500 mcg total) by mouth 2 (two) times daily. 180 capsule 2   ferrous sulfate 325 (65 FE) MG EC tablet Take 325 mg by mouth daily.     fluticasone-salmeterol (ADVAIR HFA) 115-21 MCG/ACT inhaler Inhale 2 puffs into the lungs 2 (two) times daily as needed (asthma).     furosemide  (LASIX) 20 MG tablet TAKE 1 TABLET BY MOUTH EVERY DAY AS NEEDED FOR EDEMA (Patient taking differently: Take 20 mg by mouth daily as needed for fluid or edema.) 90 tablet 3   KLOR-CON M20 20 MEQ tablet TAKE 1 TABLET BY MOUTH EVERY DAY (Patient taking differently: Take 20 mEq by mouth daily.) 90 tablet 3   pravastatin (PRAVACHOL) 40 MG tablet Take 40 mg by mouth at bedtime.      promethazine (PHENERGAN) 25 MG tablet Take 25 mg by mouth every 6 (six) hours as needed for nausea or vomiting (from migraines).      sertraline (ZOLOFT) 50 MG tablet Take 50 mg by mouth at bedtime.     VENTOLIN HFA 108 (90 Base) MCG/ACT inhaler Inhale 2 puffs into the lungs every 6 (six) hours as needed for wheezing or shortness of breath.  diltiazem (CARDIZEM) 30 MG tablet Take 1 Tablet Every 4 Hours As Needed For HR >100 45 tablet 1   No current facility-administered medications for this encounter.    Allergies  Allergen Reactions   Demerol [Meperidine Hcl]     migraine   Pneumococcal Vaccines Other (See Comments)    Went into A-Fib immediately after receiving this    Shingrix [Zoster Vac Recomb Adjuvanted] Other (See Comments)    Went into A-Fib immediately after receiving this    Other Other (See Comments)    Anesthesia- Takes a lot to anesthetize the patient   Meperidine Nausea And Vomiting and Other (See Comments)    States she "About passed out," also   Prednisone Palpitations and Other (See Comments)    Tachycardia     Social History   Socioeconomic History   Marital status: Married    Spouse name: Not on file   Number of children: Not on file   Years of education: Not on file   Highest education level: Not on file  Occupational History   Not on file  Tobacco Use   Smoking status: Never   Smokeless tobacco: Never   Tobacco comments:    Never smoke 07/26/21  Vaping Use   Vaping Use: Never used  Substance and Sexual Activity   Alcohol use: Never   Drug use: Never   Sexual activity: Not  on file  Other Topics Concern   Not on file  Social History Narrative   Lives in [place] with spouse   Retired from school system.   Social Determinants of Health   Financial Resource Strain: Not on file  Food Insecurity: Not on file  Transportation Needs: No Transportation Needs (11/26/2021)   PRAPARE - Hydrologist (Medical): No    Lack of Transportation (Non-Medical): No  Physical Activity: Not on file  Stress: Not on file  Social Connections: Not on file  Intimate Partner Violence: Not on file    Family History  Problem Relation Age of Onset   Heart attack Mother    Heart disease Mother    Heart disease Sister    Heart disease Sister    Heart disease Sister    Heart disease Sister    Heart disease Sister     ROS- All systems are reviewed and negative except as per the HPI above  Physical Exam: Vitals:   05/29/22 1528  BP: (!) 118/98  Pulse: 88  Weight: 92.9 kg  Height: '5\' 8"'$  (1.727 m)     Wt Readings from Last 3 Encounters:  05/29/22 92.9 kg  05/21/22 92.2 kg  02/05/22 96.7 kg    Labs: Lab Results  Component Value Date   NA 137 05/13/2022   K 3.9 05/13/2022   CL 103 05/13/2022   CO2 23 05/13/2022   GLUCOSE 111 (H) 05/13/2022   BUN 9 05/13/2022   CREATININE 0.82 05/13/2022   CALCIUM 9.7 05/13/2022   MG 2.1 05/13/2022   Lab Results  Component Value Date   INR 1.5 (H) 11/20/2021   No results found for: "CHOL", "HDL", "LDLCALC", "TRIG"   GEN- The patient is a well appearing female, alert and oriented x 3 today.   HEENT-head normocephalic, atraumatic, sclera clear, conjunctiva pink, hearing intact, trachea midline. Lungs- Clear to ausculation bilaterally, normal work of breathing Heart- Regular rate and rhythm, no murmurs, rubs or gallops  GI- soft, NT, ND, + BS Extremities- no clubbing, cyanosis, or edema MS- no significant  deformity or atrophy Skin- no rash or lesion Psych- euthymic mood, full affect Neuro-  strength and sensation are intact   EKG-  SR Vent. rate 88 BPM PR interval 178 ms QRS duration 86 ms QT/QTcB 376/454 ms   Echo 12/06/21 demonstrated   1. Left ventricular ejection fraction, by estimation, is 60 to 65%. The  left ventricle has normal function. The left ventricle has no regional  wall motion abnormalities. There is mild asymmetric left ventricular  hypertrophy of the septal segment. Left ventricular diastolic parameters are consistent with Grade II diastolic dysfunction (pseudonormalization). Elevated left ventricular end-diastolic pressure.   2. Right ventricular systolic function is normal. The right ventricular  size is normal. There is normal pulmonary artery systolic pressure.   3. The mitral valve is normal in structure. Trivial mitral valve  regurgitation. No evidence of mitral stenosis.   4. The aortic valve is tricuspid. Aortic valve regurgitation is trivial.  No aortic stenosis is present.   5. Aortic dilatation noted. There is mild dilatation of the aortic root,  measuring 38 mm. There is borderline dilatation of the ascending aorta,  measuring 36 mm.   6. The inferior vena cava is normal in size with greater than 50%  respiratory variability, suggesting right atrial pressure of 3 mmHg.    Epic records reviewed   CHA2DS2-VASc Score = 3  The patient's score is based upon: CHF History: 0 HTN History: 1 Diabetes History: 0 Stroke History: 0 Vascular Disease History: 0 Age Score: 1 Gender Score: 1        ASSESSMENT AND PLAN: 1. Paroxysmal Atrial Fibrillation (ICD10:  I48.0) The patient's CHA2DS2-VASc score is 3, indicating a 3.2% annual risk of stroke.   S/p repeat ablation with Dr Rayann Heman 03/14/20 ILR at [facility]. She is unsure if she wants it explanted. She may benefit from having a new one placed.  Patient has converted to SR with PRN diltiazem.  We discussed rhythm control options today. With two recent ED visits for afib and another episode  today, I think she is failing dofetilide. We discussed changing AAD to amiodarone. She had been on this years ago prior to her first ablation and does not want to go back on it stating "it does not work for me". 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 She is very nervous about the convergent procedure and is not sure she would want to pursue that. She has a visit scheduled with Dr Myles Gip 06/04/22 to discuss options.  Continue dofetilide 500 mcg BID for now. QT stable.  Continue diltiazem 240 mg daily with 30 mg PRN q 4hrs for heart racing. Continue Eliquis 5 mg BID    2. Secondary Hypercoagulable State (ICD10:  D68.69) The patient is at significant risk for stroke/thromboembolism based upon her CHA2DS2-VASc Score of 3.  Continue Apixaban (Eliquis). If she has recurrent anemia, could consider referral for [facility].   3. HTN Stable, no changes today.   Follow up with Dr Myles Gip as scheduled.     [facility] [address] [place], [address] [address] [phone]

## 2022-05-30 ENCOUNTER — Encounter (HOSPITAL_COMMUNITY): Payer: Self-pay | Admitting: *Deleted

## 2022-05-30 DIAGNOSIS — F419 Anxiety disorder, unspecified: Secondary | ICD-10-CM | POA: Diagnosis not present

## 2022-05-30 DIAGNOSIS — G4459 Other complicated headache syndrome: Secondary | ICD-10-CM | POA: Diagnosis not present

## 2022-05-30 DIAGNOSIS — I10 Essential (primary) hypertension: Secondary | ICD-10-CM | POA: Diagnosis not present

## 2022-05-30 DIAGNOSIS — I4891 Unspecified atrial fibrillation: Secondary | ICD-10-CM | POA: Diagnosis not present

## 2022-05-30 DIAGNOSIS — E785 Hyperlipidemia, unspecified: Secondary | ICD-10-CM | POA: Diagnosis not present

## 2022-05-30 DIAGNOSIS — M179 Osteoarthritis of knee, unspecified: Secondary | ICD-10-CM | POA: Diagnosis not present

## 2022-06-04 ENCOUNTER — Ambulatory Visit: Payer: Medicare Other | Attending: Cardiovascular Disease | Admitting: Cardiovascular Disease

## 2022-06-04 ENCOUNTER — Encounter: Payer: Self-pay | Admitting: Cardiovascular Disease

## 2022-06-04 VITALS — BP 152/70 | HR 80 | Ht 68.0 in | Wt 202.2 lb

## 2022-06-04 DIAGNOSIS — I4819 Other persistent atrial fibrillation: Secondary | ICD-10-CM | POA: Diagnosis not present

## 2022-06-04 DIAGNOSIS — I4892 Unspecified atrial flutter: Secondary | ICD-10-CM | POA: Diagnosis not present

## 2022-06-04 NOTE — Progress Notes (Signed)
Electrophysiology Office Note:    Date:  06/04/2022   ID:  [name], DOB 1950/06/07, MRN [id number]  PCP:  Harvie Junior, MD   [place] Providers Cardiologist:  None Electrophysiologist:  Will Meredith Leeds, MD     Referring MD: Harvie Junior, MD   History of Present Illness:    Nicole [name] is a 72 y.o. adult with a hx listed below, significant for atrial fibrillation s/p ablation and Tikosyn, referred for arrhythmia management.  She underwent a repeat AF ablation by Dr. Rayann Heman in Nov 2021. 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 I reviewed the ECGs from the recent visits. They do appear to show AF rather than an atypical flutter.  She is very hesitant to consider a convergent procedure.  Today, she reports that she is feeling well and has no acute complaints - dyspnea, chest pain, syncope, palpitations.    Past Medical History:  Diagnosis Date   Anticoagulant long-term use 11/16/2014   Anxiety 10/06/2017   Arthritis 10/06/2017   knees bilateral   Asthma 11/16/2014   Esophageal reflux 11/16/2014   Essential hypertension 08/15/2015   pt denies   H/O: GI bleed 12/11/2016   Hyperlipidemia 11/17/2014   Migraine headache 11/16/2014   Nausea 11/16/2014   Pain in limb 11/16/2014   Panic disorder 11/16/2014   Paroxysmal atrial fibrillation ([place]) 11/17/2014   Swelling of joint 11/16/2014   Symptomatic anemia 11/24/2016   Tendonitis 11/16/2014   UTI (urinary tract infection) 12/19/2015    Past Surgical History:  Procedure Laterality Date   ABDOMINAL HYSTERECTOMY     ATRIAL FIBRILLATION ABLATION N/A 03/14/2020   Procedure: ATRIAL FIBRILLATION ABLATION;  Surgeon: [name], MD;  Location: [place] [facility];  Service: Cardiovascular;   Laterality: N/A;   BIOPSY  11/22/2021   Procedure: BIOPSY;  Surgeon: Daryel November, MD;  Location: [facility] ENDOSCOPY;  Service: Gastroenterology;;   CARDIOVERSION     CESAREAN SECTION     COLONOSCOPY     COLONOSCOPY N/A 11/22/2021   Procedure: COLONOSCOPY;  Surgeon: Daryel November, MD;  Location: [place];  Service: Gastroenterology;  Laterality: N/A;   ESOPHAGOGASTRODUODENOSCOPY (EGD) WITH PROPOFOL N/A 11/22/2021   Procedure: ESOPHAGOGASTRODUODENOSCOPY (EGD) WITH PROPOFOL;  Surgeon: Daryel November, MD;  Location: [place];  Service: Gastroenterology;  Laterality: N/A;   GIVENS CAPSULE STUDY N/A 11/22/2021   Procedure: GIVENS CAPSULE STUDY;  Surgeon: Daryel November, MD;  Location: [place];  Service: Gastroenterology;  Laterality: N/A;   LOOP RECORDER INSERTION N/A 02/04/2018   Procedure: LOOP RECORDER INSERTION;  Surgeon: [name], MD;  Location: [place] [facility];  Service: Cardiovascular;  Laterality: N/A;   POLYPECTOMY  11/22/2021   Procedure: POLYPECTOMY;  Surgeon: Daryel November, MD;  Location: [facility]OSCOPY;  Service: Gastroenterology;;   ROTATOR CUFF REPAIR Left     Current Medications: Current Meds  Medication Sig   acetaminophen (TYLENOL) 500 MG tablet Take 1,000 mg by mouth every 6 (six) hours as needed for mild pain or headache.   apixaban (ELIQUIS) 5 MG TABS tablet Take 1 tablet (5 mg total) by mouth 2 (two) times daily.   Butalbital-APAP-Caffeine 50-300-40 MG CAPS Take 1 capsule by mouth 3 (three) times daily as needed (migraine).   butorphanol (STADOL) 10 MG/ML  nasal spray Place 1 spray into the nose every 4 (four) hours as needed for headache or migraine.    cetirizine (ZYRTEC) 10 MG tablet Take 10 mg by mouth daily as needed for allergies or rhinitis.   clonazePAM (KLONOPIN) 1 MG tablet Take 1 mg by mouth 3 (three) times daily.   diltiazem (CARDIZEM CD) 240 MG 24 hr capsule Take 1 capsule (240 mg total) by mouth daily.   diltiazem (CARDIZEM)  30 MG tablet Take 1 Tablet Every 4 Hours As Needed For HR >100   dofetilide (TIKOSYN) 500 MCG capsule Take 1 capsule (500 mcg total) by mouth 2 (two) times daily.   fluticasone-salmeterol (ADVAIR HFA) 115-21 MCG/ACT inhaler Inhale 2 puffs into the lungs 2 (two) times daily as needed (asthma).   furosemide (LASIX) 20 MG tablet TAKE 1 TABLET BY MOUTH EVERY DAY AS NEEDED FOR EDEMA (Patient taking differently: Take 20 mg by mouth daily as needed for fluid or edema.)   KLOR-CON M20 20 MEQ tablet TAKE 1 TABLET BY MOUTH EVERY DAY (Patient taking differently: Take 20 mEq by mouth daily.)   pravastatin (PRAVACHOL) 40 MG tablet Take 40 mg by mouth at bedtime.    promethazine (PHENERGAN) 25 MG tablet Take 25 mg by mouth every 6 (six) hours as needed for nausea or vomiting (from migraines).    sertraline (ZOLOFT) 50 MG tablet Take 50 mg by mouth at bedtime.   VENTOLIN HFA 108 (90 Base) MCG/ACT inhaler Inhale 2 puffs into the lungs every 6 (six) hours as needed for wheezing or shortness of breath.     Allergies:   Demerol [meperidine hcl], Pneumococcal vaccines, Shingrix [zoster vac recomb adjuvanted], Other, Meperidine, and Prednisone   Social History   Socioeconomic History   Marital status: Married    Spouse name: Not on file   Number of children: Not on file   Years of education: Not on file   Highest education level: Not on file  Occupational History   Not on file  Tobacco Use   Smoking status: Never   Smokeless tobacco: Never   Tobacco comments:    Never smoke 07/26/21  Vaping Use   Vaping Use: Never used  Substance and Sexual Activity   Alcohol use: Never   Drug use: Never   Sexual activity: Not on file  Other Topics Concern   Not on file  Social History Narrative   Lives in [place] with spouse   Retired from school system.   Social Determinants of Health   Financial Resource Strain: Not on file  Food Insecurity: Not on file  Transportation Needs: No Transportation Needs  (11/26/2021)   PRAPARE - Hydrologist (Medical): No    Lack of Transportation (Non-Medical): No  Physical Activity: Not on file  Stress: Not on file  Social Connections: Not on file     Family History: The patient's family history includes Heart attack in her mother; Heart disease in her mother, sister, sister, sister, sister, and sister.  ROS:   Please see the history of present illness.    All other systems reviewed and are negative.  EKGs/Labs/Other Studies Reviewed Today:      EKG:  Last EKG results: today - sinus rhythm   Recent Labs: 02/01/2022: ALT 14 05/13/2022: B Natriuretic Peptide 237.1; BUN 9; Creatinine, Ser 0.82; Hemoglobin 13.0; Magnesium 2.1; Platelets 322; Potassium 3.9; Sodium 137; TSH 1.733     Physical Exam:    VS:  BP (!) 152/70  Pulse 80   Ht 5' 8"$  (1.727 m)   Wt 202 lb 3.2 oz (91.7 kg)   SpO2 98%   BMI 30.74 kg/m     Wt Readings from Last 3 Encounters:  06/04/22 202 lb 3.2 oz (91.7 kg)  05/29/22 204 lb 12.8 oz (92.9 kg)  05/21/22 203 lb 3.2 oz (92.2 kg)     GEN: Well nourished, well developed in no acute distress CARDIAC: RRR, no murmurs, rubs, gallops RESPIRATORY:  Normal work of breathing MUSCULOSKELETAL: no edema    ASSESSMENT & PLAN:    Atrial fibrillation: s/p ablation x2 and Tikosyn with recurrences, symptomatic with with RVR. She expresses a strong preference for rhythm control. She did well for some time after her most recent ablation with repeat WACA and ablation of the posterior wall. I think it reasonable to ensure these lesion sets are still intact. If not, we will reinforce these lines. However, if the prior ablation is intact, we will switch to amiodarone. We also discussed the possibility of AVJ and pacemaker in the future since I suspect a large portion of her symptoms are due to RVR. We discussed the indication, rationale, logistics, anticipated benefits, and potential risks of the ablation  procedure including but not limited to -- bleed at the groin access site, chest pain, damage to nearby organs such as the diaphragm, lungs, or esophagus, need for a drainage tube, or prolonged hospitalization. I explained that the risk for stroke, heart attack, need for open chest surgery, or even death is very low but not zero. she  expressed understanding and wishes to proceed. Continue Tikosyn for now. ILR in placed, at [facility] Secondary hypercoagulable state:  continue anticoagulation        Medication Adjustments/Labs and Tests Ordered: Current medicines are reviewed at length with the patient today.  Concerns regarding medicines are outlined above.  Orders Placed This Encounter  Procedures   EKG 12-Lead   No orders of the defined types were placed in this encounter.    Signed, Melida Quitter, MD  06/04/2022 11:06 AM    [place]

## 2022-06-04 NOTE — Patient Instructions (Signed)
Medication Instructions:  Your physician recommends that you continue on your current medications as directed. Please refer to the Current Medication list given to you today.   *If you need a refill on your cardiac medications before your next appointment, please call your pharmacy*   Lab Work: You will need labs 2 weeks before your ablation, you will be called to schedule this. You may come to the lab any time between 7:30am and 4:30pm. If you have labs (blood work) drawn today and your tests are completely normal, you will receive your results only by: [facility] (if you have MyChart) OR A paper copy in the mail If you have any lab test that is abnormal or we need to change your treatment, we will call you to review the results.   Testing/Procedures: Your physician has requested that you have cardiac CT. Cardiac computed tomography (CT) is a painless test that uses an x-ray machine to take clear, detailed pictures of your heart. For further information please visit [facility]. Please follow instruction sheet as given.   Your physician has recommended that you have an ablation. Catheter ablation is a medical procedure used to treat some cardiac arrhythmias (irregular heartbeats). During catheter ablation, a long, thin, flexible tube is put into a blood vessel in your groin (upper thigh), or neck. This tube is called an ablation catheter. It is then guided to your heart through the blood vessel. Radio frequency waves destroy small areas of heart tissue where abnormal heartbeats may cause an arrhythmia to start.    Your ablation procedure is scheduled for Thursday Sep 05, 2022 with Dr. Kaleen Odea at 10:30am. Dennis Bast will arrive at [facility]. [facility] (main entrance A) at 08:30am.     Follow-Up: At [facility], you and your health needs are our priority.  As part of our continuing mission to provide you with exceptional heart care, we have created designated Provider Care  Teams.  These Care Teams include your primary Cardiologist (physician) and Advanced Practice Providers (APPs -  Physician Assistants and Nurse Practitioners) who all work together to provide you with the care you need, when you need it.   Your next appointment:   4 week(s) after your ablation   Provider:   You will follow up in the [facility] located at [facility]. Your provider will be: Roderic Palau, NP or Clint R. Fenton, PA-C

## 2022-06-11 ENCOUNTER — Other Ambulatory Visit: Payer: Self-pay

## 2022-06-11 ENCOUNTER — Emergency Department (HOSPITAL_COMMUNITY)
Admission: EM | Admit: 2022-06-11 | Discharge: 2022-06-11 | Disposition: A | Discharge status: Home / Self Care | Payer: Medicare Other | Attending: Emergency Medicine | Admitting: Emergency Medicine

## 2022-06-11 DIAGNOSIS — I10 Essential (primary) hypertension: Secondary | ICD-10-CM | POA: Diagnosis not present

## 2022-06-11 DIAGNOSIS — I4891 Unspecified atrial fibrillation: Secondary | ICD-10-CM | POA: Diagnosis not present

## 2022-06-11 DIAGNOSIS — R0789 Other chest pain: Secondary | ICD-10-CM | POA: Diagnosis not present

## 2022-06-11 DIAGNOSIS — Z7901 Long term (current) use of anticoagulants: Secondary | ICD-10-CM | POA: Insufficient documentation

## 2022-06-11 DIAGNOSIS — R000 Tachycardia, unspecified: Secondary | ICD-10-CM | POA: Diagnosis not present

## 2022-06-11 DIAGNOSIS — R079 Chest pain, unspecified: Secondary | ICD-10-CM | POA: Diagnosis not present

## 2022-06-11 DIAGNOSIS — Z79899 Other long term (current) drug therapy: Secondary | ICD-10-CM | POA: Diagnosis not present

## 2022-06-11 LAB — BASIC METABOLIC PANEL
Anion gap: 13 (ref 5–15)
BUN: 7 mg/dL — ABNORMAL LOW (ref 8–23)
CO2: 19 mmol/L — ABNORMAL LOW (ref 22–32)
Calcium: 9.7 mg/dL (ref 8.9–10.3)
Chloride: 106 mmol/L (ref 98–111)
Creatinine, Ser: 0.89 mg/dL (ref 0.44–1.00)
GFR, Estimated: >60 mL/min (ref >60)
Glucose, Bld: 118 mg/dL — ABNORMAL HIGH (ref 70–99)
Potassium: 4 mmol/L (ref 3.5–5.1)
Sodium: 138 mmol/L (ref 135–145)

## 2022-06-11 LAB — CBC WITH DIFFERENTIAL/PLATELET
Abs Immature Granulocytes: 0.02 10*3/uL (ref 0.00–0.07)
Basophils Absolute: 0 10*3/uL (ref 0.0–0.1)
Basophils Relative: 1 %
Eosinophils Absolute: 0.2 10*3/uL (ref 0.0–0.5)
Eosinophils Relative: 3 %
HCT: 39 % (ref 36.0–46.0)
Hemoglobin: 12.5 g/dL (ref 12.0–15.0)
Immature Granulocytes: 0 %
Lymphocytes Relative: 24 %
Lymphs Abs: 1.9 10*3/uL (ref 0.7–4.0)
MCH: 29.8 pg (ref 26.0–34.0)
MCHC: 32.1 g/dL (ref 30.0–36.0)
MCV: 92.9 fL (ref 80.0–100.0)
Monocytes Absolute: 0.9 10*3/uL (ref 0.1–1.0)
Monocytes Relative: 11 %
Neutro Abs: 4.8 10*3/uL (ref 1.7–7.7)
Neutrophils Relative %: 61 %
Platelets: 338 10*3/uL (ref 150–400)
RBC: 4.2 MIL/uL (ref 3.87–5.11)
RDW: 12.8 % (ref 11.5–15.5)
WBC: 7.8 10*3/uL (ref 4.0–10.5)
nRBC: 0 % (ref 0.0–0.2)

## 2022-06-11 LAB — MAGNESIUM: Magnesium: 2.2 mg/dL (ref 1.7–2.4)

## 2022-06-11 MED ORDER — PROPOFOL 10 MG/ML IV BOLUS
0.5000 mg/kg | Freq: Once | INTRAVENOUS | Status: AC
  Administered 2022-06-11: 46 mg via INTRAVENOUS
  Filled 2022-06-11: qty 20

## 2022-06-11 MED ORDER — PROPOFOL 10 MG/ML IV BOLUS
INTRAVENOUS | Status: AC | PRN
  Administered 2022-06-11 (×2): 20 mg via INTRAVENOUS
  Administered 2022-06-11: 30 mg via INTRAVENOUS

## 2022-06-11 MED ORDER — SODIUM CHLORIDE 0.9 % IV BOLUS
1000.0000 mL | Freq: Once | INTRAVENOUS | Status: AC
  Administered 2022-06-11: 1000 mL via INTRAVENOUS

## 2022-06-11 NOTE — ED Triage Notes (Signed)
Pt BIB by [facility] from home, pt c/o CP. Pt HR at 9pm was 170s; pt took PO Cardizem. 11pm HR Still in the 170s; pt took additional dose of Cardizem. Pt also states she is SOB and having chest pressure.  Pt cardioverted at this facility 3weeks ago.

## 2022-06-11 NOTE — ED Provider Notes (Signed)
[facility] Provider Note   CSN: [id number] Arrival date & time: 06/11/22  [id number]     History  Chief Complaint  Patient presents with   Atrial Fibrillation    Nicole [name] is a 72 y.o. adult.  72 yo F with a chief complaint of atrial fibrillation with RVR.  The patient unfortunately has struggled with this off and on.  She has had 2 ablations.  Has had frequent episodes and she tends to be very symptomatic with it.  Tells me that this started at 9 PM.  She took 2 of her doses of diltiazem as instructed but has had no significant improvement.  She then came to the emergency department for evaluation.   Atrial Fibrillation       Home Medications Prior to Admission medications   Medication Sig Start Date End Date Taking? Authorizing Provider  acetaminophen (TYLENOL) 500 MG tablet Take 1,000 mg by mouth every 6 (six) hours as needed for mild pain or headache.    [provider]  apixaban (ELIQUIS) 5 MG TABS tablet Take 1 tablet (5 mg total) by mouth 2 (two) times daily. 04/23/22   Camnitz, Will Hassell Done, MD  Butalbital-APAP-Caffeine 50-300-40 MG CAPS Take 1 capsule by mouth 3 (three) times daily as needed (migraine). 04/17/22   [provider]  butorphanol (STADOL) 10 MG/ML nasal spray Place 1 spray into the nose every 4 (four) hours as needed for headache or migraine.     [provider]  cetirizine (ZYRTEC) 10 MG tablet Take 10 mg by mouth daily as needed for allergies or rhinitis.    [provider]  clonazePAM (KLONOPIN) 1 MG tablet Take 1 mg by mouth 3 (three) times daily.    [provider]  diltiazem (CARDIZEM CD) 240 MG 24 hr capsule Take 1 capsule (240 mg total) by mouth daily. 05/21/22   Fenton, Clint R, PA  diltiazem (CARDIZEM) 30 MG tablet Take 1 Tablet Every 4 Hours As Needed For HR >100 05/29/22   Fenton, Clint R, PA  dofetilide (TIKOSYN) 500 MCG capsule Take 1 capsule (500 mcg total) by  mouth 2 (two) times daily. 11/15/21   Camnitz, Ocie Doyne, MD  ferrous sulfate 325 (65 FE) MG EC tablet Take 325 mg by mouth daily. Patient not taking: Reported on 06/04/2022 11/28/21   [provider]  fluticasone-salmeterol (ADVAIR HFA) 115-21 MCG/ACT inhaler Inhale 2 puffs into the lungs 2 (two) times daily as needed (asthma).    [provider]  furosemide (LASIX) 20 MG tablet TAKE 1 TABLET BY MOUTH EVERY DAY AS NEEDED FOR EDEMA Patient taking differently: Take 20 mg by mouth daily as needed for fluid or edema. 07/23/21   Camnitz, Will Hassell Done, MD  KLOR-CON M20 20 MEQ tablet TAKE 1 TABLET BY MOUTH EVERY DAY Patient taking differently: Take 20 mEq by mouth daily. 11/28/21   Camnitz, Ocie Doyne, MD  pravastatin (PRAVACHOL) 40 MG tablet Take 40 mg by mouth at bedtime.  11/30/18   [provider]  promethazine (PHENERGAN) 25 MG tablet Take 25 mg by mouth every 6 (six) hours as needed for nausea or vomiting (from migraines).     [provider]  sertraline (ZOLOFT) 50 MG tablet Take 50 mg by mouth at bedtime. 06/11/21   [provider]  VENTOLIN HFA 108 (90 Base) MCG/ACT inhaler Inhale 2 puffs into the lungs every 6 (six) hours as needed for wheezing or shortness of breath.  [provider]      Allergies    Demerol [meperidine hcl], Pneumococcal vaccines, Shingrix [zoster vac recomb adjuvanted], Other, Meperidine, and Prednisone    Review of Systems   Review of Systems  Physical Exam Updated Vital Signs BP 114/87   Pulse 65   Temp 98.9 F (37.2 C) (Oral)   Resp 17   Ht 5' 8"$  (1.727 m)   Wt 92 kg   SpO2 100%   BMI 30.84 kg/m  Physical Exam Vitals and nursing note reviewed.  Constitutional:      General: She is not in acute distress.    Appearance: She is well-developed. She is not diaphoretic.  HENT:     Head: Normocephalic and atraumatic.  Eyes:     Pupils: Pupils are equal, round, and reactive to light.  Cardiovascular:      Rate and Rhythm: Tachycardia present. Rhythm irregular.     Heart sounds: No murmur heard.    No friction rub. No gallop.  Pulmonary:     Effort: Pulmonary effort is normal.     Breath sounds: No wheezing or rales.  Abdominal:     General: There is no distension.     Palpations: Abdomen is soft.     Tenderness: There is no abdominal tenderness.  Musculoskeletal:        General: No tenderness.     Cervical back: Normal range of motion and neck supple.  Skin:    General: Skin is warm and dry.  Neurological:     Mental Status: She is alert and oriented to person, place, and time.  Psychiatric:        Behavior: Behavior normal.     ED Results / Procedures / Treatments   Labs (all labs ordered are listed, but only abnormal results are displayed) Labs Reviewed  BASIC METABOLIC PANEL - Abnormal; Notable for the following components:      Result Value   CO2 19 (*)    Glucose, Bld 118 (*)    BUN 7 (*)    All other components within normal limits  CBC WITH DIFFERENTIAL/PLATELET  MAGNESIUM    EKG EKG Interpretation  Date/Time:  Tuesday June 11 2022 04:47:48 EST Ventricular Rate:  64 PR Interval:  183 QRS Duration: 97 QT Interval:  425 QTC Calculation: 439 R Axis:   42 Text Interpretation: Sinus rhythm replaced a fib Otherwise no significant change Confirmed by Deno Etienne [id number]) on 06/11/2022 4:49:59 AM  Radiology No results found.  Procedures .Critical Care  Performed by: Deno Etienne, DO Authorized by: Deno Etienne, DO   Critical care provider statement:    Critical care time (minutes):  35   Critical care time was exclusive of:  Separately billable procedures and treating other patients   Critical care was time spent personally by me on the following activities:  Development of treatment plan with patient or surrogate, discussions with consultants, evaluation of patient's response to treatment, examination of patient, ordering and review of laboratory studies,  ordering and review of radiographic studies, ordering and performing treatments and interventions, pulse oximetry, re-evaluation of patient's condition and review of old charts .Cardioversion  Date/Time: 06/11/2022 4:36 AM  Performed by: Deno Etienne, DO Authorized by: Deno Etienne, DO   Consent:    Consent obtained:  Written   Consent given by:  Patient and healthcare agent   Risks discussed:  Cutaneous burn, death, induced arrhythmia and pain   Alternatives discussed:  No treatment, rate-control medication, anti-coagulation medication, delayed treatment,  observation, alternative treatment and referral Pre-procedure details:    Cardioversion basis:  Elective   Rhythm:  Atrial fibrillation   Electrode placement:  Anterior-posterior Patient sedated: Yes. Refer to sedation procedure documentation for details of sedation.  Attempt one:    Cardioversion mode:  Synchronous   Waveform:  Biphasic   Shock (Joules):  200   Shock outcome:  Conversion to normal sinus rhythm Post-procedure details:    Patient tolerance of procedure:  Tolerated well, no immediate complications .Sedation  Date/Time: 06/11/2022 4:37 AM  Performed by: Deno Etienne, DO Authorized by: Deno Etienne, DO   Consent:    Consent obtained:  Verbal and written   Consent given by:  Patient   Risks discussed:  Allergic reaction, dysrhythmia, inadequate sedation, nausea, vomiting, respiratory compromise necessitating ventilatory assistance and intubation and prolonged hypoxia resulting in organ damage   Alternatives discussed:  Analgesia without sedation Universal protocol:    Procedure explained and questions answered to patient or proxy's satisfaction: yes     Immediately prior to procedure, a time out was called: yes     Patient identity confirmed:  Anonymous protocol, patient vented/unresponsive and arm band Indications:    Procedure necessitating sedation performed by:  Physician performing sedation Pre-sedation assessment:     Time since last food or drink:  6   ASA classification: class 2 - patient with mild systemic disease     Mouth opening:  2 finger widths   Thyromental distance:  3 finger widths   Mallampati score:  II - soft palate, uvula, fauces visible   Neck mobility: normal     Pre-sedation assessments completed and reviewed: airway patency, cardiovascular function, hydration status, mental status, nausea/vomiting, pain level, respiratory function and temperature   Immediate pre-procedure details:    Reassessment: Patient reassessed immediately prior to procedure     Reviewed: vital signs     Verified: bag valve mask available, emergency equipment available, intubation equipment available, IV patency confirmed, oxygen available and suction available   Procedure details (see MAR for exact dosages):    Preoxygenation:  Nasal cannula   Sedation:  Propofol   Intended level of sedation: moderate (conscious sedation)   Analgesia:  None   Intra-procedure monitoring:  Cardiac monitor, blood pressure monitoring, continuous capnometry, continuous pulse oximetry, frequent LOC assessments and frequent vital sign checks   Intra-procedure events: none     Total Provider sedation time (minutes):  35 Post-procedure details:    Attendance: Constant attendance by certified staff until patient recovered     Recovery: Patient returned to pre-procedure baseline     Post-sedation assessments completed and reviewed: airway patency, cardiovascular function, hydration status, mental status, nausea/vomiting, pain level, respiratory function and temperature     Patient is stable for discharge or admission: yes     Procedure completion:  Tolerated well, no immediate complications     Medications Ordered in ED Medications  sodium chloride 0.9 % bolus 1,000 mL (0 mLs Intravenous Stopped 06/11/22 0455)  propofol (DIPRIVAN) 10 mg/mL bolus/IV push 46 mg (46 mg Intravenous Given 06/11/22 0426)  propofol (DIPRIVAN) 10 mg/mL  bolus/IV push (20 mg Intravenous Given 06/11/22 0432)    ED Course/ Medical Decision Making/ A&P                             Medical Decision Making Amount and/or Complexity of Data Reviewed Labs: ordered. ECG/medicine tests: ordered.   72 yo F with a chief complaint  of atrial fibrillation with RVR.  Patient has history of this, unfortunately has had 2 failed ablations.  Was also recently in the emergency department with the same.  I discussed risk and benefits of cardioversion and the patient is electing for that.  Will check electrolytes.  Patient denies illness otherwise denies cough congestion or fever denies nausea vomiting or diarrhea denies recent medication change.  Patient was cardioverted.  Feels much better.  Will discharge home.  Cardiology follow-up.  CHA2DS2/VAS Stroke Risk Points  Current as of 10 minutes ago     3 >= 2 Points: High Risk  1 - 1.99 Points: Medium Risk  0 Points: Low Risk    Last Change: N/A      Details    This score determines the patient's risk of having a stroke if the  patient has atrial fibrillation.       Points Metrics  0 Has Congestive Heart Failure:  No    Current as of 10 minutes ago  0 Has Vascular Disease:  No    Current as of 10 minutes ago  1 Has Hypertension:  Yes    Current as of 10 minutes ago  1 Age:  74    Current as of 10 minutes ago  0 Has Diabetes:  No    Current as of 10 minutes ago  0 Had Stroke:  No  Had TIA:  No  Had Thromboembolism:  No    Current as of 10 minutes ago  1 Female:  Yes    Current as of 10 minutes ago          5:12 AM:  I have discussed the diagnosis/risks/treatment options with the patient and family.  Evaluation and diagnostic testing in the emergency department does not suggest an emergent condition requiring admission or immediate intervention beyond what has been performed at this time.  They will follow up with Cards. We also discussed returning to the ED immediately if new or worsening sx  occur. We discussed the sx which are most concerning (e.g., sudden worsening pain, fever, inability to tolerate by mouth) that necessitate immediate return. Medications administered to the patient during their visit and any new prescriptions provided to the patient are listed below.  Medications given during this visit Medications  sodium chloride 0.9 % bolus 1,000 mL (0 mLs Intravenous Stopped 06/11/22 0455)  propofol (DIPRIVAN) 10 mg/mL bolus/IV push 46 mg (46 mg Intravenous Given 06/11/22 0426)  propofol (DIPRIVAN) 10 mg/mL bolus/IV push (20 mg Intravenous Given 06/11/22 0432)     The patient appears reasonably screen and/or stabilized for discharge and I doubt any other medical condition or other [facility] requiring further screening, evaluation, or treatment in the ED at this time prior to discharge.          Final Clinical Impression(s) / ED Diagnoses Final diagnoses:  Atrial fibrillation with RVR ([place])    Rx / DC Orders ED Discharge Orders          Ordered    Ambulatory referral to Cardiology       Comments: If you have not heard from the Cardiology office within the next 72 hours please call [phone].   06/11/22 [place], [place], DO 06/11/22 [phone]

## 2022-06-11 NOTE — ED Notes (Signed)
Consent signed and at the bedside

## 2022-06-11 NOTE — Discharge Instructions (Signed)
Please follow-up with your cardiologist in the office.  Return for worsening symptoms.

## 2022-06-17 ENCOUNTER — Telehealth: Payer: Self-pay

## 2022-06-17 NOTE — Telephone Encounter (Signed)
     Patient  visit on 2/20  at [facility]   Have you been able to follow up with your primary care physician? Yes   The patient was or was not able to obtain any needed medicine or equipment. Yes   Are there diet recommendations that you are having difficulty following? Na   Patient expresses understanding of discharge instructions and education provided has no other needs at this time.  Yes      [facility] [phone] [address], [place], [place] [address] Phone: [phone] Email: [email or web address]

## 2022-06-18 ENCOUNTER — Encounter (HOSPITAL_COMMUNITY): Payer: Self-pay | Admitting: Physician Assistant

## 2022-06-18 ENCOUNTER — Telehealth: Payer: Self-pay

## 2022-06-18 ENCOUNTER — Ambulatory Visit (HOSPITAL_COMMUNITY)
Admission: RE | Admit: 2022-06-18 | Discharge: 2022-06-18 | Disposition: A | Discharge status: Home / Self Care | Payer: Medicare Other | Source: Ambulatory Visit | Attending: Physician Assistant | Admitting: Physician Assistant

## 2022-06-18 VITALS — BP 132/80 | HR 79 | Ht 68.0 in | Wt 205.8 lb

## 2022-06-18 DIAGNOSIS — Z7901 Long term (current) use of anticoagulants: Secondary | ICD-10-CM | POA: Insufficient documentation

## 2022-06-18 DIAGNOSIS — Z79899 Other long term (current) drug therapy: Secondary | ICD-10-CM | POA: Insufficient documentation

## 2022-06-18 DIAGNOSIS — D649 Anemia, unspecified: Secondary | ICD-10-CM | POA: Diagnosis not present

## 2022-06-18 DIAGNOSIS — I480 Paroxysmal atrial fibrillation: Secondary | ICD-10-CM | POA: Insufficient documentation

## 2022-06-18 DIAGNOSIS — I10 Essential (primary) hypertension: Secondary | ICD-10-CM | POA: Insufficient documentation

## 2022-06-18 DIAGNOSIS — D6869 Other thrombophilia: Secondary | ICD-10-CM | POA: Insufficient documentation

## 2022-06-18 MED ORDER — AMIODARONE HCL 200 MG PO TABS: ORAL_TABLET | ORAL | 0 refills | Status: DC

## 2022-06-18 NOTE — Addendum Note (Signed)
Encounter addended by: Oliver Barre, PA on: 06/18/2022 1:10 PM  Actions taken: Clinical Note Signed

## 2022-06-18 NOTE — Addendum Note (Signed)
Encounter addended by: Juluis Mire, RN on: 06/18/2022 1:11 PM  Actions taken: Medication long-term status modified, Order list changed

## 2022-06-18 NOTE — Progress Notes (Signed)
[facility]) [place]   06/18/2022  Lonnie Iwinski May 05, 1950 [id number]  Reason for referral: Medication Assistance with Eliquis  Referral source: [facility] RN Current insurance: Traditional Medicare  Reason for call: Medication Assistance   Outreach:  Unsuccessful telephone call attempt #1 to patient.   The patient answered and engaged initially, but then the call got disconnected when I asked about income information for medication assistance screening. I called back immediately, but was sent to voicemail. HIPAA compliant voicemail left requesting a return call  Plan:  -I will make another outreach attempt to patient within 3-4 business days.  Thank you for allowing [facility] pharmacy to be a part of this patient's care. Reed Breech, PharmD Clinical Pharmacist  [place] [phone]

## 2022-06-18 NOTE — Progress Notes (Addendum)
Primary Care Physician: Harvie Junior, MD Referring Physician: Dr. Rayann Heman Primary EP: Dr Myles Gip   Nicole [name] is a 72 y.o. adult with a h/o anemia,  afib, and syncope possibly 2/2 termination pause  that presented to his office with afib with RVR at 160 bpm and was admitted for Tikosyn load. Converted to SR without pause. Qtc remained stable. She is on Eliquis for a CHADS2VASC score of 3. She is s/p repeat afib ablation with Dr Rayann Heman on 03/14/20. Patient had tachypalpitations on 02/27/21 and took one of her PRN diltiazem which did not resolve her symptoms. EMS was called and ECG conformed afib with RVR. She was given IV diltiazem which converted her to SR. Patient does report that he niece has passed away the day before the onset of her symptoms. She has had 3 episodes of afib since the start of 2023 and went to the ED 05/09/21. She spontaneously converted en route. She was seen by Dr Curt Bears 06/18/21 and her BB was increased.   At her visit 11/20/21, patient reported that she had been feeling "terrible". She becomes very SOB with minimal exertion. CBC showed severe anemia with Hgb 5.2, she was sent to the ED for evaluation and transfusion, given 2 units PRBCs. Patient underwent EGD/pill endoscopy and colonoscopy. Eliquis was held initially due to suspected GI bleed. Dr. Lyndel Safe updated the patient on the results of her pill endoscopy on the afternoon of 11/23/2021 noting two areas of nonactive bleeding in her small intestine that could explain her positive FOBT. Patient reports that she felt better almost immediately after her first transfusion.   Patient was seen at the ED 02/01/22 with rapid afib in the setting of a UTI. She woke that morning with palpitations and checked her Kardia mobile and found to be A-fib RVR with rate in the low 200s.  She took a dose of Cardizem 30 mg at 1 AM and a repeat dose of Cardizem 30 mg at 5 in the morning and also took her home Eliquis dose and Tikosyn dose.  When  EMS got to her, she was A-fib RVR 120-140s bpm. She was back in SR on arrival to the ED.   Patient was again seen at the ED for afib with RVR and underwent DCCV on 05/13/22. There were no specific triggers that she could identify. No current bleeding issues on anticoagulation. Hgb 13.0.  On follow up today, patient seen by Dr Myles Gip and is planning for repeat ablation. Unfortunately, she had rapid afib again and underwent DCCV in the ED on 06/11/22. She took her PRN diltiazem with no improvement. When EMS arrived her heart rate was up to 180 bpm.   Today, she denies symptoms of CP, orthopnea, PND, dizziness, presyncope, syncope, or neurologic sequela.  otherwise without complaint today.   Past Medical History:  Diagnosis Date   Anticoagulant long-term use 11/16/2014   Anxiety 10/06/2017   Arthritis 10/06/2017   knees bilateral   Asthma 11/16/2014   Esophageal reflux 11/16/2014   Essential hypertension 08/15/2015   pt denies   H/O: GI bleed 12/11/2016   Hyperlipidemia 11/17/2014   Migraine headache 11/16/2014   Nausea 11/16/2014   Pain in limb 11/16/2014   Panic disorder 11/16/2014   Paroxysmal atrial fibrillation ([place]) 11/17/2014   Swelling of joint 11/16/2014   Symptomatic anemia 11/24/2016   Tendonitis 11/16/2014   UTI (urinary tract infection) 12/19/2015   Past Surgical History:  Procedure Laterality Date   ABDOMINAL HYSTERECTOMY  ATRIAL FIBRILLATION ABLATION N/A 03/14/2020   Procedure: ATRIAL FIBRILLATION ABLATION;  Surgeon: [name], MD;  Location: [place] [facility];  Service: Cardiovascular;  Laterality: N/A;   BIOPSY  11/22/2021   Procedure: BIOPSY;  Surgeon: Daryel November, MD;  Location: [facility] ENDOSCOPY;  Service: Gastroenterology;;   CARDIOVERSION     CESAREAN SECTION     COLONOSCOPY     COLONOSCOPY N/A 11/22/2021   Procedure: COLONOSCOPY;  Surgeon: Daryel November, MD;  Location: [place];  Service: Gastroenterology;  Laterality: N/A;   ESOPHAGOGASTRODUODENOSCOPY  (EGD) WITH PROPOFOL N/A 11/22/2021   Procedure: ESOPHAGOGASTRODUODENOSCOPY (EGD) WITH PROPOFOL;  Surgeon: Daryel November, MD;  Location: [place];  Service: Gastroenterology;  Laterality: N/A;   GIVENS CAPSULE STUDY N/A 11/22/2021   Procedure: GIVENS CAPSULE STUDY;  Surgeon: Daryel November, MD;  Location: [place];  Service: Gastroenterology;  Laterality: N/A;   LOOP RECORDER INSERTION N/A 02/04/2018   Procedure: LOOP RECORDER INSERTION;  Surgeon: [name], MD;  Location: [place] [facility];  Service: Cardiovascular;  Laterality: N/A;   POLYPECTOMY  11/22/2021   Procedure: POLYPECTOMY;  Surgeon: Daryel November, MD;  Location: [facility] ENDOSCOPY;  Service: Gastroenterology;;   ROTATOR CUFF REPAIR Left     Current Outpatient Medications  Medication Sig Dispense Refill   acetaminophen (TYLENOL) 500 MG tablet Take 1,000 mg by mouth every 6 (six) hours as needed for mild pain or headache.     apixaban (ELIQUIS) 5 MG TABS tablet Take 1 tablet (5 mg total) by mouth 2 (two) times daily. 180 tablet 1   Butalbital-APAP-Caffeine 50-300-40 MG CAPS Take 1 capsule by mouth 3 (three) times daily as needed (migraine).     butorphanol (STADOL) 10 MG/ML nasal spray Place 1 spray into the nose every 4 (four) hours as needed for headache or migraine.      cetirizine (ZYRTEC) 10 MG tablet Take 10 mg by mouth daily as needed for allergies or rhinitis.     clonazePAM (KLONOPIN) 1 MG tablet Take 1 mg by mouth 3 (three) times daily.     diltiazem (CARDIZEM CD) 240 MG 24 hr capsule Take 1 capsule (240 mg total) by mouth daily. 30 capsule 3   diltiazem (CARDIZEM) 30 MG tablet Take 1 Tablet Every 4 Hours As Needed For HR >100 45 tablet 1   dofetilide (TIKOSYN) 500 MCG capsule Take 1 capsule (500 mcg total) by mouth 2 (two) times daily. 180 capsule 2   ferrous sulfate 325 (65 FE) MG EC tablet Take 325 mg by mouth daily.     fluticasone-salmeterol (ADVAIR HFA) 115-21 MCG/ACT inhaler Inhale 2 puffs into the  lungs 2 (two) times daily as needed (asthma).     furosemide (LASIX) 20 MG tablet TAKE 1 TABLET BY MOUTH EVERY DAY AS NEEDED FOR EDEMA 90 tablet 3   KLOR-CON M20 20 MEQ tablet TAKE 1 TABLET BY MOUTH EVERY DAY 90 tablet 3   pravastatin (PRAVACHOL) 40 MG tablet Take 40 mg by mouth at bedtime.      promethazine (PHENERGAN) 25 MG tablet Take 25 mg by mouth every 6 (six) hours as needed for nausea or vomiting (from migraines).      sertraline (ZOLOFT) 50 MG tablet Take 50 mg by mouth at bedtime.     VENTOLIN HFA 108 (90 Base) MCG/ACT inhaler Inhale 2 puffs into the lungs every 6 (six) hours as needed for wheezing or shortness of breath.     No current facility-administered medications for this encounter.  Allergies  Allergen Reactions   Demerol [Meperidine Hcl]     migraine   Pneumococcal Vaccines Other (See Comments)    Went into A-Fib immediately after receiving this    Shingrix [Zoster Vac Recomb Adjuvanted] Other (See Comments)    Went into A-Fib immediately after receiving this    Other Other (See Comments)    Anesthesia- Takes a lot to anesthetize the patient   Meperidine Nausea And Vomiting and Other (See Comments)    States she "About passed out," also   Prednisone Palpitations and Other (See Comments)    Tachycardia     Social History   Socioeconomic History   Marital status: Married    Spouse name: Not on file   Number of children: Not on file   Years of education: Not on file   Highest education level: Not on file  Occupational History   Not on file  Tobacco Use   Smoking status: Never   Smokeless tobacco: Never   Tobacco comments:    Never smoke 07/26/21  Vaping Use   Vaping Use: Never used  Substance and Sexual Activity   Alcohol use: Never   Drug use: Never   Sexual activity: Not on file  Other Topics Concern   Not on file  Social History Narrative   Lives in [place] with spouse   Retired from school system.   Social Determinants of Health    Financial Resource Strain: Not on file  Food Insecurity: Not on file  Transportation Needs: No Transportation Needs (11/26/2021)   PRAPARE - Hydrologist (Medical): No    Lack of Transportation (Non-Medical): No  Physical Activity: Not on file  Stress: Not on file  Social Connections: Not on file  Intimate Partner Violence: Not on file    Family History  Problem Relation Age of Onset   Heart attack Mother    Heart disease Mother    Heart disease Sister    Heart disease Sister    Heart disease Sister    Heart disease Sister    Heart disease Sister     ROS- All systems are reviewed and negative except as per the HPI above  Physical Exam: Vitals:   06/18/22 0936  BP: 132/80  Pulse: 79  Weight: 93.4 kg  Height: '5\' 8"'$  (1.727 m)     Wt Readings from Last 3 Encounters:  06/18/22 93.4 kg  06/11/22 92 kg  06/04/22 91.7 kg    Labs: Lab Results  Component Value Date   NA 138 06/11/2022   K 4.0 06/11/2022   CL 106 06/11/2022   CO2 19 (L) 06/11/2022   GLUCOSE 118 (H) 06/11/2022   BUN 7 (L) 06/11/2022   CREATININE 0.89 06/11/2022   CALCIUM 9.7 06/11/2022   MG 2.2 06/11/2022   Lab Results  Component Value Date   INR 1.5 (H) 11/20/2021   No results found for: "CHOL", "HDL", "LDLCALC", "TRIG"   GEN- The patient is a well appearing female, alert and oriented x 3 today.   HEENT-head normocephalic, atraumatic, sclera clear, conjunctiva pink, hearing intact, trachea midline. Lungs- Clear to ausculation bilaterally, normal work of breathing Heart- Regular rate and rhythm, no murmurs, rubs or gallops  GI- soft, NT, ND, + BS Extremities- no clubbing, cyanosis, or edema MS- no significant deformity or atrophy Skin- no rash or lesion Psych- euthymic mood, full affect Neuro- strength and sensation are intact   EKG-  SR Vent. rate 79 BPM PR interval  180 ms QRS duration 88 ms QT/QTcB 382/438 ms   Echo 12/06/21 demonstrated   1. Left  ventricular ejection fraction, by estimation, is 60 to 65%. The  left ventricle has normal function. The left ventricle has no regional  wall motion abnormalities. There is mild asymmetric left ventricular  hypertrophy of the septal segment. Left ventricular diastolic parameters are consistent with Grade II diastolic dysfunction (pseudonormalization). Elevated left ventricular end-diastolic pressure.   2. Right ventricular systolic function is normal. The right ventricular  size is normal. There is normal pulmonary artery systolic pressure.   3. The mitral valve is normal in structure. Trivial mitral valve  regurgitation. No evidence of mitral stenosis.   4. The aortic valve is tricuspid. Aortic valve regurgitation is trivial.  No aortic stenosis is present.   5. Aortic dilatation noted. There is mild dilatation of the aortic root,  measuring 38 mm. There is borderline dilatation of the ascending aorta,  measuring 36 mm.   6. The inferior vena cava is normal in size with greater than 50%  respiratory variability, suggesting right atrial pressure of 3 mmHg.    Epic records reviewed   CHA2DS2-VASc Score = 3  The patient's score is based upon: CHF History: 0 HTN History: 1 Diabetes History: 0 Stroke History: 0 Vascular Disease History: 0 Age Score: 1 Gender Score: 1        ASSESSMENT AND PLAN: 1. Paroxysmal Atrial Fibrillation (ICD10:  I48.0) The patient's CHA2DS2-VASc score is 3, indicating a 3.2% annual risk of stroke.   S/p repeat ablation with Dr Rayann Heman 03/14/20 [facility] at [facility].  S/p DCCV in ED on 06/11/22. This is her third recent ED visit for afib. She is scheduled for repeat ablation with Dr Myles Gip in May. In the meantime, I'm not sure she is benefiting from dofetilide at this point. We discussed changing AAD to amiodarone to get her to ablation and she is agreeable if Dr Myles Gip also agrees. Will d/w him.  Continue dofetilide 500 mcg BID for now. QT stable.  Continue diltiazem  240 mg daily with 30 mg PRN q 4hrs for heart racing. Continue Eliquis 5 mg BID  2. Secondary Hypercoagulable State (ICD10:  D68.69) The patient is at significant risk for stroke/thromboembolism based upon her CHA2DS2-VASc Score of 3.  Continue Apixaban (Eliquis). If she has recurrent anemia, could consider referral for Watchman.   3. HTN Stable, no changes today.    Follow up for ablation as scheduled. Sooner in AF clinic pending decision regarding amiodarone.    Addendum: D/w Dr Myles Gip, will change to amiodarone after a 3 day washout of dofetilide. Start amiodarone 400 mg BID x 1 week, then 200 mg BID x 2 weeks and then 200 mg daily. Will have her return to AF clinic for ECG in two weeks. Patient in agreement with plan.    [facility] [address] [place], [place] [address] [phone]

## 2022-06-20 ENCOUNTER — Encounter (HOSPITAL_COMMUNITY): Payer: Self-pay | Admitting: Emergency Medicine

## 2022-06-20 ENCOUNTER — Observation Stay (HOSPITAL_COMMUNITY)
Admission: EM | Admit: 2022-06-20 | Discharge: 2022-06-21 | Disposition: A | Discharge status: Home / Self Care | Payer: Medicare Other | Attending: Cardiology | Admitting: Cardiology

## 2022-06-20 ENCOUNTER — Other Ambulatory Visit: Payer: Self-pay

## 2022-06-20 ENCOUNTER — Ambulatory Visit: Payer: Medicare Other | Admitting: Cardiovascular Disease

## 2022-06-20 DIAGNOSIS — J45909 Unspecified asthma, uncomplicated: Secondary | ICD-10-CM | POA: Insufficient documentation

## 2022-06-20 DIAGNOSIS — Z79899 Other long term (current) drug therapy: Secondary | ICD-10-CM | POA: Insufficient documentation

## 2022-06-20 DIAGNOSIS — R002 Palpitations: Secondary | ICD-10-CM | POA: Diagnosis present

## 2022-06-20 DIAGNOSIS — I10 Essential (primary) hypertension: Secondary | ICD-10-CM | POA: Diagnosis present

## 2022-06-20 DIAGNOSIS — Z7901 Long term (current) use of anticoagulants: Secondary | ICD-10-CM

## 2022-06-20 DIAGNOSIS — I4891 Unspecified atrial fibrillation: Secondary | ICD-10-CM | POA: Diagnosis present

## 2022-06-20 DIAGNOSIS — I480 Paroxysmal atrial fibrillation: Secondary | ICD-10-CM

## 2022-06-20 DIAGNOSIS — R000 Tachycardia, unspecified: Secondary | ICD-10-CM | POA: Diagnosis not present

## 2022-06-20 DIAGNOSIS — R0902 Hypoxemia: Secondary | ICD-10-CM | POA: Diagnosis not present

## 2022-06-20 LAB — CBC
HCT: 38.6 % (ref 36.0–46.0)
Hemoglobin: 12.6 g/dL (ref 12.0–15.0)
MCH: 30 pg (ref 26.0–34.0)
MCHC: 32.6 g/dL (ref 30.0–36.0)
MCV: 91.9 fL (ref 80.0–100.0)
Platelets: 333 10*3/uL (ref 150–400)
RBC: 4.2 MIL/uL (ref 3.87–5.11)
RDW: 12.7 % (ref 11.5–15.5)
WBC: 9.9 10*3/uL (ref 4.0–10.5)
nRBC: 0 % (ref 0.0–0.2)

## 2022-06-20 LAB — BASIC METABOLIC PANEL
Anion gap: 9 (ref 5–15)
BUN: 10 mg/dL (ref 8–23)
CO2: 22 mmol/L (ref 22–32)
Calcium: 9.6 mg/dL (ref 8.9–10.3)
Chloride: 105 mmol/L (ref 98–111)
Creatinine, Ser: 0.91 mg/dL (ref 0.44–1.00)
GFR, Estimated: >60 mL/min (ref >60)
Glucose, Bld: 117 mg/dL — ABNORMAL HIGH (ref 70–99)
Potassium: 3.9 mmol/L (ref 3.5–5.1)
Sodium: 136 mmol/L (ref 135–145)

## 2022-06-20 LAB — MAGNESIUM: Magnesium: 2.3 mg/dL (ref 1.7–2.4)

## 2022-06-20 LAB — PROTIME-INR
INR: 1.3 — ABNORMAL HIGH (ref 0.8–1.2)
Prothrombin Time: 15.8 seconds — ABNORMAL HIGH (ref 11.4–15.2)

## 2022-06-20 MED ORDER — CLONAZEPAM 0.5 MG PO TABS
1.0000 mg | ORAL_TABLET | Freq: Three times a day (TID) | ORAL | Status: DC
  Administered 2022-06-21 (×2): 1 mg via ORAL
  Filled 2022-06-20 (×2): qty 2

## 2022-06-20 MED ORDER — FERROUS SULFATE 325 (65 FE) MG PO TABS
325.0000 mg | ORAL_TABLET | Freq: Every day | ORAL | Status: DC
  Administered 2022-06-21: 325 mg via ORAL
  Filled 2022-06-20: qty 1

## 2022-06-20 MED ORDER — ONDANSETRON HCL 4 MG/2ML IJ SOLN: 4.0000 mg | Freq: Four times a day (QID) | INTRAMUSCULAR | Status: DC | PRN

## 2022-06-20 MED ORDER — ACETAMINOPHEN 325 MG PO TABS
650.0000 mg | ORAL_TABLET | ORAL | Status: DC | PRN
  Administered 2022-06-20 – 2022-06-21 (×2): 650 mg via ORAL
  Filled 2022-06-20 (×2): qty 2

## 2022-06-20 MED ORDER — APIXABAN 5 MG PO TABS
5.0000 mg | ORAL_TABLET | Freq: Two times a day (BID) | ORAL | Status: DC
  Administered 2022-06-20 – 2022-06-21 (×3): 5 mg via ORAL
  Filled 2022-06-20 (×3): qty 1

## 2022-06-20 MED ORDER — SODIUM CHLORIDE 0.9 % IV SOLN: INTRAVENOUS | Status: DC

## 2022-06-20 MED ORDER — AMIODARONE LOAD VIA INFUSION
150.0000 mg | Freq: Once | INTRAVENOUS | Status: AC
  Administered 2022-06-20: 150 mg via INTRAVENOUS
  Filled 2022-06-20: qty 83.34

## 2022-06-20 MED ORDER — ALBUTEROL SULFATE (2.5 MG/3ML) 0.083% IN NEBU: 3.0000 mL | INHALATION_SOLUTION | Freq: Four times a day (QID) | RESPIRATORY_TRACT | Status: DC | PRN

## 2022-06-20 MED ORDER — DILTIAZEM HCL-DEXTROSE 125-5 MG/125ML-% IV SOLN (PREMIX)
5.0000 mg/h | INTRAVENOUS | Status: DC
  Administered 2022-06-20: 10 mg/h via INTRAVENOUS
  Administered 2022-06-20: 5 mg/h via INTRAVENOUS
  Administered 2022-06-21: 7.5 mg/h via INTRAVENOUS
  Filled 2022-06-20 (×3): qty 125

## 2022-06-20 MED ORDER — PRAVASTATIN SODIUM 40 MG PO TABS
40.0000 mg | ORAL_TABLET | Freq: Every day | ORAL | Status: DC
  Administered 2022-06-20: 40 mg via ORAL
  Filled 2022-06-20: qty 1

## 2022-06-20 MED ORDER — DILTIAZEM LOAD VIA INFUSION
10.0000 mg | Freq: Once | INTRAVENOUS | Status: AC
  Administered 2022-06-20: 10 mg via INTRAVENOUS
  Filled 2022-06-20: qty 10

## 2022-06-20 MED ORDER — LORATADINE 10 MG PO TABS
10.0000 mg | ORAL_TABLET | Freq: Every day | ORAL | Status: DC
  Administered 2022-06-21: 10 mg via ORAL
  Filled 2022-06-20: qty 1

## 2022-06-20 MED ORDER — AMIODARONE HCL IN DEXTROSE 360-4.14 MG/200ML-% IV SOLN
30.0000 mg/h | INTRAVENOUS | Status: DC
  Administered 2022-06-21: 30 mg/h via INTRAVENOUS
  Filled 2022-06-20: qty 200

## 2022-06-20 MED ORDER — POTASSIUM CHLORIDE CRYS ER 20 MEQ PO TBCR
20.0000 meq | EXTENDED_RELEASE_TABLET | Freq: Every day | ORAL | Status: DC
  Administered 2022-06-20 – 2022-06-21 (×2): 20 meq via ORAL
  Filled 2022-06-20 (×2): qty 1

## 2022-06-20 MED ORDER — APIXABAN 5 MG PO TABS: 5.0000 mg | ORAL_TABLET | Freq: Two times a day (BID) | ORAL | Status: DC

## 2022-06-20 MED ORDER — AMIODARONE HCL IN DEXTROSE 360-4.14 MG/200ML-% IV SOLN
60.0000 mg/h | INTRAVENOUS | Status: AC
  Administered 2022-06-20 (×3): 60 mg/h via INTRAVENOUS
  Filled 2022-06-20 (×2): qty 200

## 2022-06-20 MED ORDER — BUTORPHANOL TARTRATE 10 MG/ML NA SOLN
1.0000 | NASAL | Status: DC | PRN
  Filled 2022-06-20: qty 2.5

## 2022-06-20 MED ORDER — SERTRALINE HCL 50 MG PO TABS
50.0000 mg | ORAL_TABLET | Freq: Every day | ORAL | Status: DC
  Administered 2022-06-20: 50 mg via ORAL
  Filled 2022-06-20: qty 1

## 2022-06-20 NOTE — ED Triage Notes (Addendum)
Pt BIB EMS from home with c/o being in afib tonight. HR with ems was 120-150s. Pt compliant with meds, recently taken off of tikosyn. Pt states that this started about 2045, states she took her diltiazem then 2 hours later took a second dose.

## 2022-06-20 NOTE — H&P (Signed)
Cardiology Admission History and Physical   Patient ID: Nicole [name] MRN: [id number]; DOB: 02/01/1951   Admission date: 06/20/2022  PCP:  Harvie Junior, MD   [place] Providers Cardiologist:  None  Electrophysiologist:  Will Meredith Leeds, MD       Chief Complaint:  Palpitations  Patient Profile:   Nicole [name] is a 72 y.o. adult with pmh sx for Afib RVR, GI Bleed, Anxiety and Migraine  who is being seen 06/20/2022 for the evaluation of Afib RVR.  History of Present Illness:   Nicole [name] is a 72 y.o. adult with pmh sx for Afib RVR, GI Bleed, Anxiety and Migraine  who is being seen 06/20/2022 for the evaluation of Afib RVR. BIB EMS from home with c/o being in afib tonight. HR with ems was 120-150s.  underwent DCCV in the ED on 06/11/22. She took her PRN diltiazem with no improvement. When EMS arrived her heart rate was up to 180 bpm. She was on tikosyn at that time. She has had S/p repeat ablation with Dr Rayann Heman 03/14/20. Plan was to switch tikosyn to amiodarone after a 3 day washout of dofetilide. So she was supposed to start amiodarone on 2nd March however went back again in Afib. Currently she denies all symptoms except palpitations. She was started on diltiazem drip by ED and Cardiology was called for admission.    Past Medical History:  Diagnosis Date   Anticoagulant long-term use 11/16/2014   Anxiety 10/06/2017   Arthritis 10/06/2017   knees bilateral   Asthma 11/16/2014   Esophageal reflux 11/16/2014   Essential hypertension 08/15/2015   pt denies   H/O: GI bleed 12/11/2016   Hyperlipidemia 11/17/2014   Migraine headache 11/16/2014   Nausea 11/16/2014   Pain in limb 11/16/2014   Panic disorder 11/16/2014   Paroxysmal atrial fibrillation ([place]) 11/17/2014   Swelling of joint 11/16/2014   Symptomatic anemia 11/24/2016   Tendonitis 11/16/2014   UTI (urinary tract infection) 12/19/2015    Past Surgical History:  Procedure Laterality Date   ABDOMINAL  HYSTERECTOMY     ATRIAL FIBRILLATION ABLATION N/A 03/14/2020   Procedure: ATRIAL FIBRILLATION ABLATION;  Surgeon: [name], MD;  Location: [place] [facility];  Service: Cardiovascular;  Laterality: N/A;   BIOPSY  11/22/2021   Procedure: BIOPSY;  Surgeon: Daryel November, MD;  Location: [facility] ENDOSCOPY;  Service: Gastroenterology;;   CARDIOVERSION     CESAREAN SECTION     COLONOSCOPY     COLONOSCOPY N/A 11/22/2021   Procedure: COLONOSCOPY;  Surgeon: Daryel November, MD;  Location: [place];  Service: Gastroenterology;  Laterality: N/A;   ESOPHAGOGASTRODUODENOSCOPY (EGD) WITH PROPOFOL N/A 11/22/2021   Procedure: ESOPHAGOGASTRODUODENOSCOPY (EGD) WITH PROPOFOL;  Surgeon: Daryel November, MD;  Location: [place];  Service: Gastroenterology;  Laterality: N/A;   GIVENS CAPSULE STUDY N/A 11/22/2021   Procedure: GIVENS CAPSULE STUDY;  Surgeon: Daryel November, MD;  Location: [place];  Service: Gastroenterology;  Laterality: N/A;   LOOP RECORDER INSERTION N/A 02/04/2018   Procedure: LOOP RECORDER INSERTION;  Surgeon: [name], MD;  Location: [place] [facility];  Service: Cardiovascular;  Laterality: N/A;   POLYPECTOMY  11/22/2021   Procedure: POLYPECTOMY;  Surgeon: Daryel November, MD;  Location: [facility] ENDOSCOPY;  Service: Gastroenterology;;   ROTATOR CUFF REPAIR Left      Medications Prior to Admission: Prior to Admission medications   Medication Sig Start Date End Date Taking? Authorizing Provider  acetaminophen (TYLENOL) 500 MG tablet Take 1,000  mg by mouth every 6 (six) hours as needed for mild pain or headache.    [provider]  amiodarone (PACERONE) 200 MG tablet Take 2 tablets (400 mg total) by mouth 2 (two) times daily for 7 days, THEN 1 tablet (200 mg total) 2 (two) times daily for 14 days, THEN 1 tablet (200 mg total) daily. 06/22/22 06/02/23  Fenton, Clint R, PA  apixaban (ELIQUIS) 5 MG TABS tablet Take 1 tablet (5 mg total) by mouth 2 (two) times daily.  04/23/22   Camnitz, Will Hassell Done, MD  Butalbital-APAP-Caffeine 50-300-40 MG CAPS Take 1 capsule by mouth 3 (three) times daily as needed (migraine). 04/17/22   [provider]  butorphanol (STADOL) 10 MG/ML nasal spray Place 1 spray into the nose every 4 (four) hours as needed for headache or migraine.     [provider]  cetirizine (ZYRTEC) 10 MG tablet Take 10 mg by mouth daily as needed for allergies or rhinitis.    [provider]  clonazePAM (KLONOPIN) 1 MG tablet Take 1 mg by mouth 3 (three) times daily.    [provider]  diltiazem (CARDIZEM CD) 240 MG 24 hr capsule Take 1 capsule (240 mg total) by mouth daily. 05/21/22   Fenton, Clint R, PA  diltiazem (CARDIZEM) 30 MG tablet Take 1 Tablet Every 4 Hours As Needed For HR >100 05/29/22   Fenton, Clint R, PA  ferrous sulfate 325 (65 FE) MG EC tablet Take 325 mg by mouth daily. 11/28/21   [provider]  fluticasone-salmeterol (ADVAIR HFA) 115-21 MCG/ACT inhaler Inhale 2 puffs into the lungs 2 (two) times daily as needed (asthma).    [provider]  furosemide (LASIX) 20 MG tablet TAKE 1 TABLET BY MOUTH EVERY DAY AS NEEDED FOR EDEMA 07/23/21   Curt Bears, Ocie Doyne, MD  KLOR-CON M20 20 MEQ tablet TAKE 1 TABLET BY MOUTH EVERY DAY 11/28/21   Camnitz, Ocie Doyne, MD  pravastatin (PRAVACHOL) 40 MG tablet Take 40 mg by mouth at bedtime.  11/30/18   [provider]  promethazine (PHENERGAN) 25 MG tablet Take 25 mg by mouth every 6 (six) hours as needed for nausea or vomiting (from migraines).     [provider]  sertraline (ZOLOFT) 50 MG tablet Take 50 mg by mouth at bedtime. 06/11/21   [provider]  VENTOLIN HFA 108 (90 Base) MCG/ACT inhaler Inhale 2 puffs into the lungs every 6 (six) hours as needed for wheezing or shortness of breath.    [provider]     Allergies:    Allergies  Allergen Reactions   Demerol [Meperidine Hcl]     migraine   Pneumococcal  Vaccines Other (See Comments)    Went into A-Fib immediately after receiving this    Shingrix [Zoster Vac Recomb Adjuvanted] Other (See Comments)    Went into A-Fib immediately after receiving this    Other Other (See Comments)    Anesthesia- Takes a lot to anesthetize the patient   Meperidine Nausea And Vomiting and Other (See Comments)    States she "About passed out," also   Prednisone Palpitations and Other (See Comments)    Tachycardia     Social History:   Social History   Socioeconomic History   Marital status: Married    Spouse name: Not on file   Number of children: Not on file   Years of education: Not on file   Highest education level: Not on file  Occupational History  Not on file  Tobacco Use   Smoking status: Never   Smokeless tobacco: Never   Tobacco comments:    Never smoke 07/26/21  Vaping Use   Vaping Use: Never used  Substance and Sexual Activity   Alcohol use: Never   Drug use: Never   Sexual activity: Not on file  Other Topics Concern   Not on file  Social History Narrative   Lives in [place] with spouse   Retired from school system.   Social Determinants of Health   Financial Resource Strain: Not on file  Food Insecurity: Not on file  Transportation Needs: No Transportation Needs (11/26/2021)   PRAPARE - Hydrologist (Medical): No    Lack of Transportation (Non-Medical): No  Physical Activity: Not on file  Stress: Not on file  Social Connections: Not on file  Intimate Partner Violence: Not on file    Family History:   The patient's family history includes Heart attack in her mother; Heart disease in her mother, sister, sister, sister, sister, and sister.    ROS:  Please see the history of present illness.  All other ROS reviewed and negative.     Physical Exam/Data:   Vitals:   06/20/22 0200 06/20/22 0230 06/20/22 0245 06/20/22 0300  BP: 131/83 133/83  138/73  Pulse: 63 82 90   Resp: (!) 21 (!) 21 (!)  23   Temp:      SpO2: 99% 100% 99%    No intake or output data in the 24 hours ending 06/20/22 0419    06/18/2022    9:36 AM 06/11/2022    2:59 AM 06/04/2022   10:27 AM  Last 3 Weights  Weight (lbs) 205 lb 12.8 oz 202 lb 13.2 oz 202 lb 3.2 oz  Weight (kg) 93.35 kg 92 kg 91.717 kg     There is no height or weight on file to calculate BMI.  General:  Well nourished, well developed, in no acute distress HEENT: normal Neck: no JVD Vascular: No carotid bruits; Distal pulses 2+ bilaterally   Cardiac:  normal S1, S2; tachycardic; irregular; no murmur  Lungs:  clear to auscultation bilaterally, no wheezing, rhonchi or rales  Abd: soft, nontender, no hepatomegaly  Ext: no edema Musculoskeletal:  No deformities, BUE and BLE strength normal and equal Skin: warm and dry  Neuro:  CNs 2-12 intact, no focal abnormalities noted Psych:  Normal affect    EKG:  The ECG that was done  was personally reviewed and demonstrates Afib RVR  Relevant CV Studies:  Echo 12/06/21 demonstrated   1. Left ventricular ejection fraction, by estimation, is 60 to 65%. The  left ventricle has normal function. The left ventricle has no regional  wall motion abnormalities. There is mild asymmetric left ventricular  hypertrophy of the septal segment. Left ventricular diastolic parameters are consistent with Grade II diastolic dysfunction (pseudonormalization). Elevated left ventricular end-diastolic pressure.   2. Right ventricular systolic function is normal. The right ventricular  size is normal. There is normal pulmonary artery systolic pressure.   3. The mitral valve is normal in structure. Trivial mitral valve  regurgitation. No evidence of mitral stenosis.   4. The aortic valve is tricuspid. Aortic valve regurgitation is trivial.  No aortic stenosis is present.   5. Aortic dilatation noted. There is mild dilatation of the aortic root,  measuring 38 mm. There is borderline dilatation of the ascending aorta,   measuring 36 mm.   6. The  inferior vena cava is normal in size with greater than 50%  respiratory variability, suggesting right atrial pressure of 3 mmHg.   Laboratory Data:  High Sensitivity Troponin:  No results for input(s): "TROPONINIHS" in the last 720 hours.    Chemistry Recent Labs  Lab 06/20/22 0140  NA 136  K 3.9  CL 105  CO2 22  GLUCOSE 117*  BUN 10  CREATININE 0.91  CALCIUM 9.6  MG 2.3  GFRNONAA >60  ANIONGAP 9    No results for input(s): "PROT", "ALBUMIN", "AST", "ALT", "ALKPHOS", "BILITOT" in the last 168 hours. Lipids No results for input(s): "CHOL", "TRIG", "HDL", "LABVLDL", "LDLCALC", "CHOLHDL" in the last 168 hours. Hematology Recent Labs  Lab 06/20/22 0140  WBC 9.9  RBC 4.20  HGB 12.6  HCT 38.6  MCV 91.9  MCH 30.0  MCHC 32.6  RDW 12.7  PLT 333   Thyroid No results for input(s): "TSH", "FREET4" in the last 168 hours. BNPNo results for input(s): "BNP", "PROBNP" in the last 168 hours.  DDimer No results for input(s): "DDIMER" in the last 168 hours.   Radiology/Studies:  No results found.   Assessment and Plan:   # Afib RVR # HTN # Anxiety  -S/p repeat ablation with Dr Rayann Heman 03/14/20  -S/p DCCV in ED on 06/11/22.  -4th ED visit for Afib RVR -She is scheduled for repeat ablation with Dr Myles Gip in May. In the meantime, plan was to switch her from dofetilide at this point  to amiodarone to get her to ablation -However back in Afib RVR -Start diltazem drip -DCCV in AM -NPO -Plan to start amiodarone in AM after atleast 48 hours off tikosyn -Continue elliquis     For questions or updates, please contact [place] Please consult [email or web address] for contact info under     Signed, Jaci Lazier, MD  06/20/2022 4:19 AM

## 2022-06-20 NOTE — ED Notes (Signed)
ED TO INPATIENT HANDOFF REPORT  ED Nurse Name and Phone #: [phone]  S Name/Age/Gender Nicole [name] 72 y.o. adult Room/Bed: 040C/040C  Code Status   Code Status: Full Code  Home/SNF/Other Home Patient oriented to: self, place, time, and situation Is this baseline? Yes   Triage Complete: Triage complete  Chief Complaint Atrial fibrillation ([place]) [I48.91] Atrial fibrillation with RVR ([place]) [I48.91]  Triage Note Pt BIB EMS from home with c/o being in afib tonight. HR with ems was 120-150s. Pt compliant with meds, recently taken off of tikosyn. Pt states that this started about 2045, states she took her diltiazem then 2 hours later took a second dose.    Allergies Allergies  Allergen Reactions   Demerol [Meperidine Hcl] Nausea And Vomiting and Other (See Comments)    Triggers migraine.   Pneumococcal Vaccines Other (See Comments)    Triggered A-Fib   Shingrix [Zoster Vac Recomb Adjuvanted] Other (See Comments)    Triggered A-Fib   Other Other (See Comments)    Anesthesia- Takes a lot to anesthetize the patient   Prednisone Palpitations and Other (See Comments)    Tachycardia     Level of Care/Admitting Diagnosis ED Disposition     ED Disposition  Admit   Condition  --   [place]: [place] [[id number]]  Level of Care: Telemetry Cardiac [103]  May admit patient to [facility] or [facility] if equivalent level of care is available:: No  Covid Evaluation: Asymptomatic - no recent exposure (last 10 days) testing not required  Diagnosis: Atrial fibrillation with RVR [facility]) [id number]  Admitting Physician: Freada Bergeron [id number]  Attending Physician: Freada Bergeron [id number]  Certification:: I certify this patient will need inpatient services for at least 2 midnights          B Medical/Surgery History Past Medical History:  Diagnosis Date   Anticoagulant long-term use 11/16/2014   Anxiety 10/06/2017   Arthritis  10/06/2017   knees bilateral   Asthma 11/16/2014   Esophageal reflux 11/16/2014   Essential hypertension 08/15/2015   pt denies   H/O: GI bleed 12/11/2016   Hyperlipidemia 11/17/2014   Migraine headache 11/16/2014   Nausea 11/16/2014   Pain in limb 11/16/2014   Panic disorder 11/16/2014   Paroxysmal atrial fibrillation ([place]) 11/17/2014   Swelling of joint 11/16/2014   Symptomatic anemia 11/24/2016   Tendonitis 11/16/2014   UTI (urinary tract infection) 12/19/2015   Past Surgical History:  Procedure Laterality Date   ABDOMINAL HYSTERECTOMY     ATRIAL FIBRILLATION ABLATION N/A 03/14/2020   Procedure: ATRIAL FIBRILLATION ABLATION;  Surgeon: [name], MD;  Location: [place] CV LAB;  Service: Cardiovascular;  Laterality: N/A;   BIOPSY  11/22/2021   Procedure: BIOPSY;  Surgeon: Daryel November, MD;  Location: [facility] ENDOSCOPY;  Service: Gastroenterology;;   CARDIOVERSION     CESAREAN SECTION     COLONOSCOPY     COLONOSCOPY N/A 11/22/2021   Procedure: COLONOSCOPY;  Surgeon: Daryel November, MD;  Location: [place];  Service: Gastroenterology;  Laterality: N/A;   ESOPHAGOGASTRODUODENOSCOPY (EGD) WITH PROPOFOL N/A 11/22/2021   Procedure: ESOPHAGOGASTRODUODENOSCOPY (EGD) WITH PROPOFOL;  Surgeon: Daryel November, MD;  Location: [place];  Service: Gastroenterology;  Laterality: N/A;   GIVENS CAPSULE STUDY N/A 11/22/2021   Procedure: GIVENS CAPSULE STUDY;  Surgeon: Daryel November, MD;  Location: [place];  Service: Gastroenterology;  Laterality: N/A;   LOOP RECORDER INSERTION N/A 02/04/2018   Procedure: LOOP RECORDER INSERTION;  Surgeon: [name], MD;  Location: [place] CV LAB;  Service: Cardiovascular;  Laterality: N/A;   POLYPECTOMY  11/22/2021   Procedure: POLYPECTOMY;  Surgeon: Daryel November, MD;  Location: [facility] ENDOSCOPY;  Service: Gastroenterology;;   Cherre Robins CUFF REPAIR Left      A IV Location/Drains/Wounds Patient Lines/Drains/Airways Status     Active  Line/Drains/Airways     Name Placement date Placement time Site Days   Peripheral IV 06/20/22 20 G Left Antecubital 06/20/22  0201  Antecubital  less than 1            Intake/Output Last 24 hours No intake or output data in the 24 hours ending 06/20/22 1320  Labs/Imaging Results for orders placed or performed during the hospital encounter of 06/20/22 (from the past 48 hour(s))  Basic metabolic panel     Status: Abnormal   Collection Time: 06/20/22  1:40 AM  Result Value Ref Range   Sodium 136 135 - 145 mmol/L   Potassium 3.9 3.5 - 5.1 mmol/L   Chloride 105 98 - 111 mmol/L   CO2 22 22 - 32 mmol/L   Glucose, Bld 117 (H) 70 - 99 mg/dL    Comment: Glucose reference range applies only to samples taken after fasting for at least 8 hours.   BUN 10 8 - 23 mg/dL   Creatinine, Ser 0.91 0.44 - 1.00 mg/dL   Calcium 9.6 8.9 - 10.3 mg/dL   GFR, Estimated >60 >60 mL/min    Comment: (NOTE) Calculated using the CKD-EPI Creatinine Equation (2021)    Anion gap 9 5 - 15    Comment: Performed at [facility] [address], [place], [place] [address]  Magnesium     Status: None   Collection Time: 06/20/22  1:40 AM  Result Value Ref Range   Magnesium 2.3 1.7 - 2.4 mg/dL    Comment: Performed at [facility] [address] [place] [address]  CBC     Status: None   Collection Time: 06/20/22  1:40 AM  Result Value Ref Range   WBC 9.9 4.0 - 10.5 K/uL   RBC 4.20 3.87 - 5.11 MIL/uL   Hemoglobin 12.6 12.0 - 15.0 g/dL   HCT 38.6 36.0 - 46.0 %   MCV 91.9 80.0 - 100.0 fL   MCH 30.0 26.0 - 34.0 pg   MCHC 32.6 30.0 - 36.0 g/dL   RDW 12.7 11.5 - 15.5 %   Platelets 333 150 - 400 K/uL   nRBC 0.0 0.0 - 0.2 %    Comment: Performed at [facility], [place] [address], [place], [place] [address]   No results found.  Pending Labs Unresulted Labs (From admission, onward)     Start     Ordered   06/20/22 1037  Protime-INR  ONCE - STAT,   STAT        06/20/22 1036             Vitals/Pain Today's Vitals   06/20/22 1215 06/20/22 1230 06/20/22 1245 06/20/22 1315  BP: 120/73 (!) 125/93 (!) 134/114 124/82  Pulse: 97 99 100 (!) 122  Resp: 18 19 (!) 24 16  Temp:    98.5 F (36.9 C)  TempSrc:    Oral  SpO2: 97% 97% 97% 98%  PainSc:        Isolation Precautions No active isolations  Medications Medications  diltiazem (CARDIZEM) 1 mg/mL load via infusion 10 mg (10 mg Intravenous Bolus from  Bag 06/20/22 0213)    And  diltiazem (CARDIZEM) 125 mg in dextrose 5% 125 mL (1 mg/mL) infusion (5 mg/hr Intravenous New Bag/Given 06/20/22 0213)  acetaminophen (TYLENOL) tablet 650 mg (has no administration in time range)  ondansetron (ZOFRAN) injection 4 mg (has no administration in time range)  apixaban (ELIQUIS) tablet 5 mg (5 mg Oral Given 06/20/22 1033)  0.9 %  sodium chloride infusion (has no administration in time range)  amiodarone (NEXTERONE) 1.8 mg/mL load via infusion 150 mg (150 mg Intravenous Bolus from Bag 06/20/22 1235)    Followed by  amiodarone (NEXTERONE PREMIX) 360-4.14 MG/200ML-% (1.8 mg/mL) IV infusion (60 mg/hr Intravenous New Bag/Given 06/20/22 1244)    Followed by  amiodarone (NEXTERONE PREMIX) 360-4.14 MG/200ML-% (1.8 mg/mL) IV infusion (has no administration in time range)  potassium chloride SA (KLOR-CON M) CR tablet 20 mEq (20 mEq Oral Given 06/20/22 1246)    Mobility walks     Focused Assessments Cardiac Assessment Handoff:  Cardiac Rhythm: Atrial fibrillation Lab Results  Component Value Date   TROPONINI <0.03 09/29/2018   No results found for: "DDIMER" Does the Patient currently have chest pain? No    R Recommendations: See Admitting Provider Note  Report given to:   Additional Notes:

## 2022-06-20 NOTE — ED Notes (Signed)
ED TO INPATIENT HANDOFF REPORT  ED Nurse Name and Phone #: [phone] [place] Y RN  S Name/Age/Gender Nicole [name] 72 y.o. adult Room/Bed: 034C/034C  Code Status   Code Status: Full Code  Home/SNF/Other Home Patient oriented to: self, place, time, and situation Is this baseline? Yes   Triage Complete: Triage complete  Chief Complaint Atrial fibrillation [facility]) [I48.91]  Triage Note Pt BIB EMS from home with c/o being in afib tonight. HR with ems was 120-150s. Pt compliant with meds, recently taken off of tikosyn. Pt states that this started about 2045, states she took her diltiazem then 2 hours later took a second dose.    Allergies Allergies  Allergen Reactions   Demerol [Meperidine Hcl]     migraine   Pneumococcal Vaccines Other (See Comments)    Went into A-Fib immediately after receiving this    Shingrix [Zoster Vac Recomb Adjuvanted] Other (See Comments)    Went into A-Fib immediately after receiving this    Other Other (See Comments)    Anesthesia- Takes a lot to anesthetize the patient   Meperidine Nausea And Vomiting and Other (See Comments)    States she "About passed out," also   Prednisone Palpitations and Other (See Comments)    Tachycardia     Level of Care/Admitting Diagnosis ED Disposition     ED Disposition  Admit   Condition  --   [place]: [place] [[id number]]  Level of Care: Progressive [102]  Admit to Progressive based on following criteria: CARDIOVASCULAR & THORACIC of moderate stability with acute coronary syndrome symptoms/low risk myocardial infarction/hypertensive urgency/arrhythmias/heart failure potentially compromising stability and stable post cardiovascular intervention patients.  May admit patient to [facility] or [facility] if equivalent level of care is available:: No  Covid Evaluation: Asymptomatic - no recent exposure (last 10 days) testing not required  Diagnosis: Atrial fibrillation ([place])  [427.31.ICD-9-CM]  Admitting Physician: Jaci Lazier [id number]  Attending Physician: Jolaine Artist [id number]  Certification:: I certify this patient will need inpatient services for at least 2 midnights  Estimated Length of Stay: 2          B Medical/Surgery History Past Medical History:  Diagnosis Date   Anticoagulant long-term use 11/16/2014   Anxiety 10/06/2017   Arthritis 10/06/2017   knees bilateral   Asthma 11/16/2014   Esophageal reflux 11/16/2014   Essential hypertension 08/15/2015   pt denies   H/O: GI bleed 12/11/2016   Hyperlipidemia 11/17/2014   Migraine headache 11/16/2014   Nausea 11/16/2014   Pain in limb 11/16/2014   Panic disorder 11/16/2014   Paroxysmal atrial fibrillation ([place]) 11/17/2014   Swelling of joint 11/16/2014   Symptomatic anemia 11/24/2016   Tendonitis 11/16/2014   UTI (urinary tract infection) 12/19/2015   Past Surgical History:  Procedure Laterality Date   ABDOMINAL HYSTERECTOMY     ATRIAL FIBRILLATION ABLATION N/A 03/14/2020   Procedure: ATRIAL FIBRILLATION ABLATION;  Surgeon: [name], MD;  Location: [place] CV LAB;  Service: Cardiovascular;  Laterality: N/A;   BIOPSY  11/22/2021   Procedure: BIOPSY;  Surgeon: Daryel November, MD;  Location: [facility] ENDOSCOPY;  Service: Gastroenterology;;   CARDIOVERSION     CESAREAN SECTION     COLONOSCOPY     COLONOSCOPY N/A 11/22/2021   Procedure: COLONOSCOPY;  Surgeon: Daryel November, MD;  Location: [place];  Service: Gastroenterology;  Laterality: N/A;   ESOPHAGOGASTRODUODENOSCOPY (EGD) WITH PROPOFOL N/A 11/22/2021   Procedure: ESOPHAGOGASTRODUODENOSCOPY (EGD) WITH PROPOFOL;  Surgeon: Daryel November, MD;  Location: [place];  Service: Gastroenterology;  Laterality: N/A;   GIVENS CAPSULE STUDY N/A 11/22/2021   Procedure: GIVENS CAPSULE STUDY;  Surgeon: Daryel November, MD;  Location: [place];  Service: Gastroenterology;  Laterality: N/A;   LOOP RECORDER INSERTION N/A 02/04/2018    Procedure: LOOP RECORDER INSERTION;  Surgeon: [name], MD;  Location: [place] CV LAB;  Service: Cardiovascular;  Laterality: N/A;   POLYPECTOMY  11/22/2021   Procedure: POLYPECTOMY;  Surgeon: Daryel November, MD;  Location: [facility] ENDOSCOPY;  Service: Gastroenterology;;   Cherre Robins CUFF REPAIR Left      A IV Location/Drains/Wounds Patient Lines/Drains/Airways Status     Active Line/Drains/Airways     Name Placement date Placement time Site Days   Peripheral IV 06/20/22 20 G Left Antecubital 06/20/22  0201  Antecubital  less than 1            Intake/Output Last 24 hours No intake or output data in the 24 hours ending 06/20/22 0938  Labs/Imaging Results for orders placed or performed during the hospital encounter of 06/20/22 (from the past 48 hour(s))  Basic metabolic panel     Status: Abnormal   Collection Time: 06/20/22  1:40 AM  Result Value Ref Range   Sodium 136 135 - 145 mmol/L   Potassium 3.9 3.5 - 5.1 mmol/L   Chloride 105 98 - 111 mmol/L   CO2 22 22 - 32 mmol/L   Glucose, Bld 117 (H) 70 - 99 mg/dL    Comment: Glucose reference range applies only to samples taken after fasting for at least 8 hours.   BUN 10 8 - 23 mg/dL   Creatinine, Ser 0.91 0.44 - 1.00 mg/dL   Calcium 9.6 8.9 - 10.3 mg/dL   GFR, Estimated >60 >60 mL/min    Comment: (NOTE) Calculated using the CKD-EPI Creatinine Equation (2021)    Anion gap 9 5 - 15    Comment: Performed at [facility] [address] [place], [place] [address]  Magnesium     Status: None   Collection Time: 06/20/22  1:40 AM  Result Value Ref Range   Magnesium 2.3 1.7 - 2.4 mg/dL    Comment: Performed at [facility] [address], [place] [address]  CBC     Status: None   Collection Time: 06/20/22  1:40 AM  Result Value Ref Range   WBC 9.9 4.0 - 10.5 K/uL   RBC 4.20 3.87 - 5.11 MIL/uL   Hemoglobin 12.6 12.0 - 15.0 g/dL   HCT 38.6 36.0 - 46.0 %   MCV 91.9 80.0 - 100.0 fL   MCH 30.0 26.0  - 34.0 pg   MCHC 32.6 30.0 - 36.0 g/dL   RDW 12.7 11.5 - 15.5 %   Platelets 333 150 - 400 K/uL   nRBC 0.0 0.0 - 0.2 %    Comment: Performed at [facility], [place] [address] [place], [place] [address]   No results found.  Pending Labs Unresulted Labs (From admission, onward)    None       Vitals/Pain Today's Vitals   06/20/22 0800 06/20/22 0842 06/20/22 0843 06/20/22 0900  BP: (!) 135/97 139/87  (!) 120/92  Pulse: (!) 117 (!) 126 (!) 108 (!) 113  Resp: '17  13 15  '$ Temp:      TempSrc:      SpO2: 97% 98% 97% 98%  PainSc:  Isolation Precautions No active isolations  Medications Medications  diltiazem (CARDIZEM) 1 mg/mL load via infusion 10 mg (10 mg Intravenous Bolus from Bag 06/20/22 0213)    And  diltiazem (CARDIZEM) 125 mg in dextrose 5% 125 mL (1 mg/mL) infusion (5 mg/hr Intravenous New Bag/Given 06/20/22 0213)  acetaminophen (TYLENOL) tablet 650 mg (has no administration in time range)  ondansetron (ZOFRAN) injection 4 mg (has no administration in time range)  apixaban (ELIQUIS) tablet 5 mg (has no administration in time range)    Mobility walks     Focused Assessments    R Recommendations: See Admitting Provider Note  Report given to:   Additional Notes:

## 2022-06-20 NOTE — Progress Notes (Signed)
Rounding Note    Patient Name: Nicole [name] Date of Encounter: 06/20/2022  [place] Cardiologist: None   Subjective   Continues to have palpitations. Remains in rapid AFib  Inpatient Medications    Scheduled Meds:  apixaban  5 mg Oral BID   Continuous Infusions:  diltiazem (CARDIZEM) infusion 5 mg/hr (06/20/22 0213)   PRN Meds: acetaminophen, ondansetron (ZOFRAN) IV   Vital Signs    Vitals:   06/20/22 0700 06/20/22 0709 06/20/22 0730 06/20/22 0740  BP: (!) 140/101  129/75   Pulse: (!) 117 (!) 118 (!) 122 (!) 119  Resp: '16 17 20 17  '$ Temp:      TempSrc:      SpO2: 98% 98% 98% 98%   No intake or output data in the 24 hours ending 06/20/22 0746    06/18/2022    9:36 AM 06/11/2022    2:59 AM 06/04/2022   10:27 AM  Last 3 Weights  Weight (lbs) 205 lb 12.8 oz 202 lb 13.2 oz 202 lb 3.2 oz  Weight (kg) 93.35 kg 92 kg 91.717 kg      Telemetry    Afib with RVR - Personally Reviewed  ECG    Afib with RVR - Personally Reviewed  Physical Exam   GEN: No acute distress.   Neck: No JVD Cardiac: Tachycardic, irregular, no murmurs Respiratory: Clear to auscultation bilaterally. GI: Soft, nontender, non-distended  MS: No edema; No deformity. Neuro:  Nonfocal  Psych: Normal affect   Labs    High Sensitivity Troponin:  No results for input(s): "TROPONINIHS" in the last 720 hours.   Chemistry Recent Labs  Lab 06/20/22 0140  NA 136  K 3.9  CL 105  CO2 22  GLUCOSE 117*  BUN 10  CREATININE 0.91  CALCIUM 9.6  MG 2.3  GFRNONAA >60  ANIONGAP 9    Lipids No results for input(s): "CHOL", "TRIG", "HDL", "LABVLDL", "LDLCALC", "CHOLHDL" in the last 168 hours.  Hematology Recent Labs  Lab 06/20/22 0140  WBC 9.9  RBC 4.20  HGB 12.6  HCT 38.6  MCV 91.9  MCH 30.0  MCHC 32.6  RDW 12.7  PLT 333   Thyroid No results for input(s): "TSH", "FREET4" in the last 168 hours.  BNPNo results for input(s): "BNP", "PROBNP" in the last 168 hours.  DDimer  No results for input(s): "DDIMER" in the last 168 hours.   Radiology    No results found.  Cardiac Studies   Echo 12/06/21 demonstrated   1. Left ventricular ejection fraction, by estimation, is 60 to 65%. The  left ventricle has normal function. The left ventricle has no regional  wall motion abnormalities. There is mild asymmetric left ventricular  hypertrophy of the septal segment. Left ventricular diastolic parameters are consistent with Grade II diastolic dysfunction (pseudonormalization). Elevated left ventricular end-diastolic pressure.   2. Right ventricular systolic function is normal. The right ventricular  size is normal. There is normal pulmonary artery systolic pressure.   3. The mitral valve is normal in structure. Trivial mitral valve  regurgitation. No evidence of mitral stenosis.   4. The aortic valve is tricuspid. Aortic valve regurgitation is trivial.  No aortic stenosis is present.   5. Aortic dilatation noted. There is mild dilatation of the aortic root,  measuring 38 mm. There is borderline dilatation of the ascending aorta,  measuring 36 mm.   6. The inferior vena cava is normal in size with greater than 50%  respiratory variability, suggesting right atrial  pressure of 3 mmHg.   Patient Profile     72 y.o. adult female with history of Afib, prior GIB, anxiety and migraines who presented to the ER with Afib with RVR.  Assessment & Plan    #Afib with RVR: Patient with history of atrial fibrillation s/p ablation x2 (last in 2021) and tikosyn load with recurrence of Afib with RVR leading to multiple ER visits. Her tikosyn was stopped with plans to transition to amiodarone on 03/02 but developed recurrent RVR prompting admission. Remains in rapid Afib this morning. Plan discussed with EP. Will start IV amiodarone now and plan for DCCV tomorrow. Patient has not missed any doses of AC and is amenable to proceed.  -Plan discussed with Dr. Myles Gip -Start amiodarone  gtt -Plan for DCCV tomorrow; discussed with the patient and she is amenable to proceed -Continue apixaban '5mg'$  BID; has not missed doses -Wean dilt as able -Planned for amiodarone long-term as well as repeat ablation with Dr. Myles Gip in 08/2022  For questions or updates, please contact [place] Please consult [email or web address] for contact info under        Signed, Freada Bergeron, MD  06/20/2022, 7:46 AM

## 2022-06-20 NOTE — ED Notes (Signed)
Pt assisted to restroom by Jonelle Sidle, RN.

## 2022-06-20 NOTE — Plan of Care (Signed)
  Problem: Education: Goal: Knowledge of General Education information will improve Description: Including pain rating scale, medication(s)/side effects and non-pharmacologic comfort measures Outcome: Progressing   Problem: Safety: Goal: Ability to remain free from injury will improve Outcome: Progressing   

## 2022-06-20 NOTE — ED Provider Notes (Signed)
[facility] Provider Note   CSN: [id number] Arrival date & time: 06/20/22  0134     History  Chief Complaint  Patient presents with   Atrial Fibrillation    Nicole [name] is a 72 y.o. adult.  The history is provided by the patient and a relative.  Patient presents with episode of atrial fibrillation.  She reports that approximately 5 hours ago she had onset of rapid heart rate and palpitations.  She reports shortness of breath and chest pressure.  She reports this feels similar to prior episodes of A-fib.  She reports medication compliance.  She has not missed any Eliquis.  She is scheduled to start amiodarone on March 2 She was recently taken off [facility] Medications Prior to Admission medications   Medication Sig Start Date End Date Taking? Authorizing Provider  acetaminophen (TYLENOL) 500 MG tablet Take 1,000 mg by mouth every 6 (six) hours as needed for mild pain or headache.    [provider]  amiodarone (PACERONE) 200 MG tablet Take 2 tablets (400 mg total) by mouth 2 (two) times daily for 7 days, THEN 1 tablet (200 mg total) 2 (two) times daily for 14 days, THEN 1 tablet (200 mg total) daily. 06/22/22 06/02/23  Fenton, Clint R, PA  apixaban (ELIQUIS) 5 MG TABS tablet Take 1 tablet (5 mg total) by mouth 2 (two) times daily. 04/23/22   Camnitz, Will Hassell Done, MD  Butalbital-APAP-Caffeine 50-300-40 MG CAPS Take 1 capsule by mouth 3 (three) times daily as needed (migraine). 04/17/22   [provider]  butorphanol (STADOL) 10 MG/ML nasal spray Place 1 spray into the nose every 4 (four) hours as needed for headache or migraine.     [provider]  cetirizine (ZYRTEC) 10 MG tablet Take 10 mg by mouth daily as needed for allergies or rhinitis.    [provider]  clonazePAM (KLONOPIN) 1 MG tablet Take 1 mg by mouth 3 (three) times daily.    [provider]  diltiazem (CARDIZEM CD) 240 MG 24  hr capsule Take 1 capsule (240 mg total) by mouth daily. 05/21/22   Fenton, Clint R, PA  diltiazem (CARDIZEM) 30 MG tablet Take 1 Tablet Every 4 Hours As Needed For HR >100 05/29/22   Fenton, Clint R, PA  ferrous sulfate 325 (65 FE) MG EC tablet Take 325 mg by mouth daily. 11/28/21   [provider]  fluticasone-salmeterol (ADVAIR HFA) 115-21 MCG/ACT inhaler Inhale 2 puffs into the lungs 2 (two) times daily as needed (asthma).    [provider]  furosemide (LASIX) 20 MG tablet TAKE 1 TABLET BY MOUTH EVERY DAY AS NEEDED FOR EDEMA 07/23/21   Curt Bears, Ocie Doyne, MD  KLOR-CON M20 20 MEQ tablet TAKE 1 TABLET BY MOUTH EVERY DAY 11/28/21   Camnitz, Ocie Doyne, MD  pravastatin (PRAVACHOL) 40 MG tablet Take 40 mg by mouth at bedtime.  11/30/18   [provider]  promethazine (PHENERGAN) 25 MG tablet Take 25 mg by mouth every 6 (six) hours as needed for nausea or vomiting (from migraines).     [provider]  sertraline (ZOLOFT) 50 MG tablet Take 50 mg by mouth at bedtime. 06/11/21   [provider]  VENTOLIN HFA 108 (90 Base) MCG/ACT inhaler Inhale 2 puffs into the lungs every 6 (six) hours as needed for wheezing or shortness of breath.    [provider]      Allergies  Demerol [meperidine hcl], Pneumococcal vaccines, Shingrix [zoster vac recomb adjuvanted], Other, Meperidine, and Prednisone    Review of Systems   Review of Systems  Constitutional:  Negative for fever.  Respiratory:  Positive for shortness of breath.   Cardiovascular:  Positive for palpitations.  Neurological:  Negative for syncope.    Physical Exam Updated Vital Signs BP 138/87   Pulse (!) 139   Temp 97.9 F (36.6 C)   Resp 18   SpO2 98%  Physical Exam CONSTITUTIONAL: Elderly, no acute distress HEAD: Normocephalic/atraumatic EYES: EOMI/PERRL ENMT: Mucous membranes moist NECK: supple no meningeal signs CV: S1/S2 noted, tachycardic and irregular LUNGS: Lungs are clear to  auscultation bilaterally, no apparent distress ABDOMEN: soft, nontender NEURO: Pt is awake/alert/appropriate, moves all extremitiesx4.  No facial droop.   EXTREMITIES: pulses normal/equal, full ROM, chronic edema to both legs SKIN: warm, color normal PSYCH: no abnormalities of mood noted, alert and oriented to situation  ED Results / Procedures / Treatments   Labs (all labs ordered are listed, but only abnormal results are displayed) Labs Reviewed  BASIC METABOLIC PANEL - Abnormal; Notable for the following components:      Result Value   Glucose, Bld 117 (*)    All other components within normal limits  MAGNESIUM  CBC    EKG EKG Interpretation  Date/Time:  Thursday June 20 2022 01:39:10 EST Ventricular Rate:  147 PR Interval:    QRS Duration: 117 QT Interval:  291 QTC Calculation: 455 R Axis:   85 Text Interpretation: Atrial fibrillation Nonspecific intraventricular conduction delay ST depression, probably rate related Confirmed by Ripley Fraise [phone]) on 06/20/2022 1:55:05 AM  Radiology No results found.  Procedures .Critical Care  Performed by: Ripley Fraise, MD Authorized by: Ripley Fraise, MD   Critical care provider statement:    Critical care time (minutes):  45   Critical care start time:  06/20/2022 2:15 AM   Critical care end time:  06/20/2022 3:00 AM   Critical care time was exclusive of:  Separately billable procedures and treating other patients   Critical care was necessary to treat or prevent imminent or life-threatening deterioration of the following conditions:  Cardiac failure and circulatory failure   Critical care was time spent personally by me on the following activities:  Examination of patient, discussions with consultants, development of treatment plan with patient or surrogate, pulse oximetry, ordering and review of laboratory studies, ordering and performing treatments and interventions and obtaining history from patient or  surrogate   I assumed direction of critical care for this patient from another provider in my specialty: no     Care discussed with: admitting provider       Medications Ordered in ED Medications  diltiazem (CARDIZEM) 1 mg/mL load via infusion 10 mg (10 mg Intravenous Bolus from Bag 06/20/22 0213)    And  diltiazem (CARDIZEM) 125 mg in dextrose 5% 125 mL (1 mg/mL) infusion (5 mg/hr Intravenous New Bag/Given 06/20/22 0213)  acetaminophen (TYLENOL) tablet 650 mg (has no administration in time range)  ondansetron (ZOFRAN) injection 4 mg (has no administration in time range)  apixaban (ELIQUIS) tablet 5 mg (has no administration in time range)    ED Course/ Medical Decision Making/ A&P Clinical Course as of 06/20/22 0446  Thu Jun 20, 2022  0203 Patient presents with rapid A-fib.  She was just recently cardioverted.  She is scheduled to start amiodarone this weekend after Tikosyn washout.  She already took Cardizem at home without relief.  Will  place on Cardizem drip.  Denies any missed recent Eliquis dosing [DW]  0346 D/w dr Humphrey Rolls with cardiology.  He will evaluate for admission  [DW]    Clinical Course User Index [DW] Ripley Fraise, MD                             Medical Decision Making Amount and/or Complexity of Data Reviewed Labs: ordered.  Risk Prescription drug management. Decision regarding hospitalization.   This patient presents to the ED for concern of palpitations, this involves an extensive number of treatment options, and is a complaint that carries with it a high risk of complications and morbidity.  The differential diagnosis includes but is not limited to atrial fibrillation, sinus tachycardia, atrial flutter, ventricular tachycardia, SVT  Comorbidities that complicate the patient evaluation: Patient's presentation is complicated by their history of hyperlipidemia, hypertension  Additional history obtained: Additional history obtained from family Records  reviewed  cardiology notes reviewed  Lab Tests: I Ordered, and personally interpreted labs.  The pertinent results include: Labs overall unremarkable   Cardiac Monitoring: The patient was maintained on a cardiac monitor.  I personally viewed and interpreted the cardiac monitor which showed an underlying rhythm of:  Atrial Fibrillation  Medicines ordered and prescription drug management: I ordered medication including Cardizem for atrial fibrillation Reevaluation of the patient after these medicines showed that the patient    improved   Critical Interventions:  IV Cardizem, admission  Consultations Obtained: I requested consultation with the admitting physician Dr. Humphrey Rolls , and discussed  findings as well as pertinent plan - they recommend: Admit  Reevaluation: After the interventions noted above, I reevaluated the patient and found that they have :improved  Complexity of problems addressed: Patient's presentation is most consistent with  acute presentation with potential threat to life or bodily function  Disposition: After consideration of the diagnostic results and the patient's response to treatment,  I feel that the patent would benefit from admission   .           Final Clinical Impression(s) / ED Diagnoses Final diagnoses:  Atrial fibrillation with RVR [facility])    Rx / DC Orders ED Discharge Orders          Ordered    Amb referral to AFIB Clinic        06/20/22 0155              Ripley Fraise, MD 06/20/22 [phone]

## 2022-06-21 ENCOUNTER — Encounter (HOSPITAL_COMMUNITY): Payer: Self-pay | Admitting: Cardiology

## 2022-06-21 ENCOUNTER — Inpatient Hospital Stay (HOSPITAL_COMMUNITY): Payer: Medicare Other | Admitting: Anesthesiology

## 2022-06-21 ENCOUNTER — Encounter (HOSPITAL_COMMUNITY)
Admission: EM | Disposition: A | Discharge status: Home / Self Care | Payer: Self-pay | Source: Home / Self Care | Attending: Emergency Medicine

## 2022-06-21 DIAGNOSIS — I480 Paroxysmal atrial fibrillation: Secondary | ICD-10-CM | POA: Diagnosis not present

## 2022-06-21 DIAGNOSIS — F419 Anxiety disorder, unspecified: Secondary | ICD-10-CM

## 2022-06-21 DIAGNOSIS — J45909 Unspecified asthma, uncomplicated: Secondary | ICD-10-CM | POA: Diagnosis not present

## 2022-06-21 DIAGNOSIS — D6869 Other thrombophilia: Secondary | ICD-10-CM

## 2022-06-21 DIAGNOSIS — Z7901 Long term (current) use of anticoagulants: Secondary | ICD-10-CM | POA: Diagnosis not present

## 2022-06-21 DIAGNOSIS — I10 Essential (primary) hypertension: Secondary | ICD-10-CM | POA: Diagnosis not present

## 2022-06-21 DIAGNOSIS — I4819 Other persistent atrial fibrillation: Secondary | ICD-10-CM

## 2022-06-21 DIAGNOSIS — I119 Hypertensive heart disease without heart failure: Secondary | ICD-10-CM

## 2022-06-21 DIAGNOSIS — I4891 Unspecified atrial fibrillation: Secondary | ICD-10-CM

## 2022-06-21 DIAGNOSIS — Z79899 Other long term (current) drug therapy: Secondary | ICD-10-CM | POA: Diagnosis not present

## 2022-06-21 HISTORY — PX: CARDIOVERSION: SHX1299

## 2022-06-21 LAB — CBC
HCT: 37.6 % (ref 36.0–46.0)
Hemoglobin: 12.4 g/dL (ref 12.0–15.0)
MCH: 30.3 pg (ref 26.0–34.0)
MCHC: 33 g/dL (ref 30.0–36.0)
MCV: 91.9 fL (ref 80.0–100.0)
Platelets: 313 10*3/uL (ref 150–400)
RBC: 4.09 MIL/uL (ref 3.87–5.11)
RDW: 12.8 % (ref 11.5–15.5)
WBC: 6.7 10*3/uL (ref 4.0–10.5)
nRBC: 0 % (ref 0.0–0.2)

## 2022-06-21 LAB — COMPREHENSIVE METABOLIC PANEL
ALT: 13 U/L (ref 0–44)
AST: 17 U/L (ref 15–41)
Albumin: 3.8 g/dL (ref 3.5–5.0)
Alkaline Phosphatase: 62 U/L (ref 38–126)
Anion gap: 10 (ref 5–15)
BUN: 7 mg/dL — ABNORMAL LOW (ref 8–23)
CO2: 21 mmol/L — ABNORMAL LOW (ref 22–32)
Calcium: 9.1 mg/dL (ref 8.9–10.3)
Chloride: 106 mmol/L (ref 98–111)
Creatinine, Ser: 0.83 mg/dL (ref 0.44–1.00)
GFR, Estimated: >60 mL/min (ref >60)
Glucose, Bld: 99 mg/dL (ref 70–99)
Potassium: 4.3 mmol/L (ref 3.5–5.1)
Sodium: 137 mmol/L (ref 135–145)
Total Bilirubin: 0.3 mg/dL (ref 0.3–1.2)
Total Protein: 8 g/dL (ref 6.5–8.1)

## 2022-06-21 LAB — TSH: TSH: 2.83 u[IU]/mL (ref 0.350–4.500)

## 2022-06-21 SURGERY — CARDIOVERSION
Anesthesia: General

## 2022-06-21 MED ORDER — LIDOCAINE HCL (CARDIAC) PF 100 MG/5ML IV SOSY
PREFILLED_SYRINGE | INTRAVENOUS | Status: DC | PRN
  Administered 2022-06-21: 60 mg via INTRAVENOUS

## 2022-06-21 MED ORDER — PROPOFOL 10 MG/ML IV BOLUS
INTRAVENOUS | Status: DC | PRN
  Administered 2022-06-21: 70 mg via INTRAVENOUS

## 2022-06-21 MED ORDER — AMIODARONE HCL 200 MG PO TABS: ORAL_TABLET | ORAL | 0 refills | Status: DC

## 2022-06-21 NOTE — CV Procedure (Signed)
    CARDIOVERSION NOTE  Procedure: Electrical Cardioversion Indications:  Atrial Fibrillation  Procedure Details:  Consent: Risks of procedure as well as the alternatives and risks of each were explained to the (patient/caregiver).  Consent for procedure obtained.  Time Out: Verified patient identification, verified procedure, site/side was marked, verified correct patient position, special equipment/implants available, medications/allergies/relevent history reviewed, required imaging and test results available.  Performed  Patient placed on cardiac monitor, pulse oximetry, supplemental oxygen as necessary.  Sedation given:  propofol per anesthesia Pacer pads placed anterior and posterior chest.  Cardioverted 1 time(s).  Cardioverted at 200J biphasic.  Impression: Findings: Post procedure EKG shows: NSR Complications: None Patient did tolerate procedure well.  Plan: Successful DCCV with a single 200J biphasic shock to NSR.  Time Spent Directly with the Patient:  30 minutes   Pixie Casino, MD, [facility], [place] Director of the Advanced Lipid Disorders &  Cardiovascular Risk Reduction Clinic Diplomate of the American Board of Clinical Lipidology Attending Cardiologist  Direct Dial: [phone]  Fax: [phone]  Website:  [email or web address] Aerielle Stoklosa 06/21/2022, 9:50 AM

## 2022-06-21 NOTE — Care Management CC44 (Signed)
Condition Code 44 Documentation Completed  Patient Details  Name: Nicole [name] MRN: [id number] Date of Birth: 1951/03/30   Condition Code 44 given:  Yes Patient signature on Condition Code 44 notice:  Yes Documentation of 2 MD's agreement:  Yes Code 44 added to claim:  Yes    Bethena Roys, RN 06/21/2022, 1:53 PM

## 2022-06-21 NOTE — Transfer of Care (Signed)
Immediate Anesthesia Transfer of Care Note  Patient: Nicole [name]  Procedure(s) Performed: CARDIOVERSION  Patient Location: Endoscopy Unit  Anesthesia Type:MAC  Level of Consciousness: awake and alert   Airway & Oxygen Therapy: Patient Spontanous Breathing and Patient connected to nasal cannula oxygen  Post-op Assessment: Report given to RN and Post -op Vital signs reviewed and stable  Post vital signs: Reviewed and stable  Last Vitals:  Vitals Value Taken Time  BP    Temp    Pulse    Resp    SpO2      Last Pain:  Vitals:   06/21/22 0845  TempSrc: Temporal  PainSc: 0-No pain         Complications: No notable events documented.

## 2022-06-21 NOTE — Interval H&P Note (Signed)
History and Physical Interval Note:  06/21/2022 8:55 AM  Nicole [name]  has presented today for surgery, with the diagnosis of AFIB.  The various methods of treatment have been discussed with the patient and family. After consideration of risks, benefits and other options for treatment, the patient has consented to  Procedure(s): CARDIOVERSION (N/A) as a surgical intervention.  The patient's history has been reviewed, patient examined, no change in status, stable for surgery.  I have reviewed the patient's chart and labs.  Questions were answered to the patient's satisfaction.     Pixie Casino

## 2022-06-21 NOTE — Anesthesia Postprocedure Evaluation (Signed)
Anesthesia Post Note  Patient: Nicole [name]  Procedure(s) Performed: CARDIOVERSION     Patient location during evaluation: PACU Anesthesia Type: General Level of consciousness: awake and alert Pain management: pain level controlled Vital Signs Assessment: post-procedure vital signs reviewed and stable Respiratory status: spontaneous breathing, nonlabored ventilation, respiratory function stable and patient connected to nasal cannula oxygen Cardiovascular status: blood pressure returned to baseline and stable Postop Assessment: no apparent nausea or vomiting Anesthetic complications: no  No notable events documented.  Last Vitals:  Vitals:   06/21/22 0958 06/21/22 1008  BP: 112/71 124/81  Pulse: 70 68  Resp: 15 17  Temp:    SpO2: 95% 98%    Last Pain:  Vitals:   06/21/22 0948  TempSrc: Temporal  PainSc: 0-No pain                 [name]

## 2022-06-21 NOTE — Care Management Obs Status (Signed)
[facility] NOTIFICATION   Patient Details  Name: Nicole [name] MRN: [id number] Date of Birth: 1950-06-02   Medicare Observation Status Notification Given:  Yes    Bethena Roys, RN 06/21/2022, 1:53 PM

## 2022-06-21 NOTE — Progress Notes (Addendum)
Rounding Note    Patient Name: Nicole [name] Date of Encounter: 06/21/2022  [place] Cardiologist: Dr. Curt Bears   Subjective   Patient denies chest pain, sob, dizziness this AM. She does have occasional fluttering in her chest, worse when she stands up. She has some diarrhea on IV amiodarone, but also has not been eating.   Inpatient Medications    Scheduled Meds:  apixaban  5 mg Oral BID   clonazePAM  1 mg Oral TID   ferrous sulfate  325 mg Oral Daily   loratadine  10 mg Oral Daily   potassium chloride  20 mEq Oral Daily   pravastatin  40 mg Oral QHS   sertraline  50 mg Oral QHS   Continuous Infusions:  sodium chloride     amiodarone 30 mg/hr (06/21/22 0009)   diltiazem (CARDIZEM) infusion 7.5 mg/hr (06/21/22 0236)   PRN Meds: acetaminophen, albuterol, butorphanol, ondansetron (ZOFRAN) IV   Vital Signs    Vitals:   06/20/22 1707 06/20/22 2012 06/21/22 0014 06/21/22 0512  BP: 115/72 120/75 131/80 127/79  Pulse: (!) 109 (!) 102 100 97  Resp: '19 18 18 18  '$ Temp: 98.2 F (36.8 C) 98.1 F (36.7 C) 98 F (36.7 C) 98.2 F (36.8 C)  TempSrc: Oral Oral Oral Oral  SpO2: 98% 98% 95% 97%  Weight:      Height:        Intake/Output Summary (Last 24 hours) at 06/21/2022 0812 Last data filed at 06/20/2022 2346 Gross per 24 hour  Intake 518.92 ml  Output --  Net 518.92 ml      06/20/2022    2:23 PM 06/18/2022    9:36 AM 06/11/2022    2:59 AM  Last 3 Weights  Weight (lbs) 205 lb 12.8 oz 205 lb 12.8 oz 202 lb 13.2 oz  Weight (kg) 93.35 kg 93.35 kg 92 kg      Telemetry    Atrial fibrillation, HR 90-130s  - Personally Reviewed  ECG    2/29 EKG showed atrial fibrillation, HR 147, nonspecific IVCD  - Personally Reviewed  Physical Exam   GEN: No acute distress. Laying flat in the bed with head elevated  Neck: No JVD with head at 30 degrees  Cardiac: Irregular rate and rhythm, tachycardic. No murmurs.  Radial pulses 2+ bilaterally Respiratory: Clear to  auscultation bilaterally. Normal WOB on room air  GI: Soft, nontender, non-distended  MS: No edema in BLE; No deformity. Neuro:  Nonfocal  Psych: Normal affect   Labs    High Sensitivity Troponin:  No results for input(s): "TROPONINIHS" in the last 720 hours.   Chemistry Recent Labs  Lab 06/20/22 0140  NA 136  K 3.9  CL 105  CO2 22  GLUCOSE 117*  BUN 10  CREATININE 0.91  CALCIUM 9.6  MG 2.3  GFRNONAA >60  ANIONGAP 9    Lipids No results for input(s): "CHOL", "TRIG", "HDL", "LABVLDL", "LDLCALC", "CHOLHDL" in the last 168 hours.  Hematology Recent Labs  Lab 06/20/22 0140  WBC 9.9  RBC 4.20  HGB 12.6  HCT 38.6  MCV 91.9  MCH 30.0  MCHC 32.6  RDW 12.7  PLT 333   Thyroid No results for input(s): "TSH", "FREET4" in the last 168 hours.  BNPNo results for input(s): "BNP", "PROBNP" in the last 168 hours.  DDimer No results for input(s): "DDIMER" in the last 168 hours.   Radiology    No results found.  Cardiac Studies   Echocardiogram 12/06/21  1. Left ventricular ejection fraction, by estimation, is 60 to 65%. The  left ventricle has normal function. The left ventricle has no regional  wall motion abnormalities. There is mild asymmetric left ventricular  hypertrophy of the septal segment. Left  ventricular diastolic parameters are consistent with Grade II diastolic  dysfunction (pseudonormalization). Elevated left ventricular end-diastolic  pressure.   2. Right ventricular systolic function is normal. The right ventricular  size is normal. There is normal pulmonary artery systolic pressure.   3. The mitral valve is normal in structure. Trivial mitral valve  regurgitation. No evidence of mitral stenosis.   4. The aortic valve is tricuspid. Aortic valve regurgitation is trivial.  No aortic stenosis is present.   5. Aortic dilatation noted. There is mild dilatation of the aortic root,  measuring 38 mm. There is borderline dilatation of the ascending aorta,   measuring 36 mm.   6. The inferior vena cava is normal in size with greater than 50%  respiratory variability, suggesting right atrial pressure of 3 mmHg.   Patient Profile     72 y.o. adult female with a past medical history of atrial fibrillation, prior GI bleed, anxiety, migraines. Patient presented to the ED on 2/29 with afib with RVR  Assessment & Plan    Atrial fibrillation with RVR - Patient has a history of difficult to treat atrial fibrillation. She is s/p ablation x2 (most recently in 2021), and was treated with tikosyn. Continued to have breakthrough afib with RVR while on tikosyn, leading to multiple ER visits - Patient was started on IV diltiazem in the ED. Tikosyn was stopped on 2/29 with plans to transition to amiodarone on 3/2. However, patient developed recurrent RVR and was admitted  - Dr. Johney Frame discussed with EP yesterday-- started IV amiodarone yesterday in addition to IV diltiazem.  - Per telemetry, patient remains in atrial fibrillation with HR ranging from 90-130 BPM. Predominantly, HR is in the 100s  - For now, continue IV amiodarone and IV diltiazem. Dilt is running at 7/5 mL/hr. Titrate/wean diltiazem as needed  - Patient has been compliant with anticoagulation. Planning DCCV today  - Continue eliquis 5 mg BID  - Plan for amiodarone long term. Checking CMP today to assess liver function, also checking TSH  - Also planning repeat ablation form 08/2022     For questions or updates, please contact [facility] Please consult [email or web address] for contact info under        Signed, Margie Billet, PA-C  06/21/2022, 8:12 AM    Patient seen and examined and agree with Nicole Ports, PA-C as detailed above.  In brief, the patient is a 72 y.o. adult female with history of Afib, prior GIB, anxiety and migraines who presented to the ER with Afib with RVR and was admitted to the Cardiology service for further management.  Patient with history of atrial  fibrillation s/p ablation x2 (last in 2021) and tikosyn load with recurrence of Afib with RVR leading to multiple ER visits. Her tikosyn was stopped with and was planned to start PO amiodarone but presented to the ER with recurrence Afib with RVR. She was loaded with IV amiodarone and underwent successful DCCV today. Will discharge home with plans to continue amiodarone load.  GEN: No acute distress.   Neck: No JVD Cardiac: RRR, 1/6 systolic murmur Respiratory: Clear to auscultation bilaterally. GI: Soft, nontender, non-distended  MS: No edema; No deformity. Neuro:  Nonfocal  Psych: Normal affect    Plan: -S/p  DCCV with successful return to NSR -Will continue amiodarone '400mg'$  BID x5 days, then '200mg'$  BID x14 days and then '200mg'$  daily thereafter -Continue apixaban for [facility] -Has follow-up with Afib clinic scheduled  Plan discussed with the patient and her family at bedside at length.  Gwyndolyn Kaufman, MD

## 2022-06-21 NOTE — Anesthesia Preprocedure Evaluation (Addendum)
Anesthesia Evaluation  Patient identified by MRN, date of birth, ID band Patient awake    Reviewed: Allergy & Precautions, NPO status , Patient's Chart, lab work & pertinent test results  Airway Mallampati: II  TM Distance: >3 FB Neck ROM: Full    Dental  (+) Teeth Intact, Dental Advisory Given   Pulmonary asthma    breath sounds clear to auscultation       Cardiovascular hypertension, Pt. on medications + dysrhythmias Atrial Fibrillation  Rhythm:Irregular Rate:Abnormal  Echo:  1. Left ventricular ejection fraction, by estimation, is 60 to 65%. The  left ventricle has normal function. The left ventricle has no regional  wall motion abnormalities. There is mild asymmetric left ventricular  hypertrophy of the septal segment. Left  ventricular diastolic parameters are consistent with Grade II diastolic  dysfunction (pseudonormalization). Elevated left ventricular end-diastolic  pressure.   2. Right ventricular systolic function is normal. The right ventricular  size is normal. There is normal pulmonary artery systolic pressure.   3. The mitral valve is normal in structure. Trivial mitral valve  regurgitation. No evidence of mitral stenosis.   4. The aortic valve is tricuspid. Aortic valve regurgitation is trivial.  No aortic stenosis is present.   5. Aortic dilatation noted. There is mild dilatation of the aortic root,  measuring 38 mm. There is borderline dilatation of the ascending aorta,  measuring 36 mm.   6. The inferior vena cava is normal in size with greater than 50%  respiratory variability, suggesting right atrial pressure of 3 mmHg.     Neuro/Psych  Headaches PSYCHIATRIC DISORDERS Anxiety        GI/Hepatic Neg liver ROS,GERD  ,,  Endo/Other  negative endocrine ROS    Renal/GU negative Renal ROS     Musculoskeletal  (+) Arthritis ,    Abdominal   Peds  Hematology negative hematology ROS (+)   Anesthesia  Other Findings   Reproductive/Obstetrics                             Anesthesia Physical Anesthesia Plan  ASA: 3  Anesthesia Plan: General   Post-op Pain Management:    Induction: Intravenous  PONV Risk Score and Plan: 0  Airway Management Planned: Natural Airway  Additional Equipment: None  Intra-op Plan:   Post-operative Plan: Extubation in OR  Informed Consent: I have reviewed the patients History and Physical, chart, labs and discussed the procedure including the risks, benefits and alternatives for the proposed anesthesia with the patient or authorized representative who has indicated his/her understanding and acceptance.       Plan Discussed with: CRNA  Anesthesia Plan Comments:        Anesthesia Quick Evaluation

## 2022-06-21 NOTE — Discharge Summary (Addendum)
Discharge Summary    Patient ID: Nicole [name] MRN: [id number]; DOB: 1951-03-22  Admit date: 06/20/2022 Discharge date: 06/21/2022  PCP:  Harvie Junior, [place] Providers Cardiologist:  None  Electrophysiologist:  Will Meredith Leeds, MD    Discharge Diagnoses    Principal Problem:   Atrial fibrillation [facility]) Active Problems:   Anticoagulant long-term use   Essential hypertension   Atrial fibrillation with RVR ([place])    Diagnostic Studies/Procedures    _____________   History of Present Illness     Nicole [name] is a 72 y.o. adult with a past medical history of atrial fibrillation with RVR s/p ablation x2, anxiety, migraines, history of GI bleed. Her most recent atrial fibrillation ablation was in 02/2020 with Dr. Rayann Heman. More recently, she has been taking tikosyn. She had recently been seen in the ED on 2/20 with afib with RVR, and underwent DCCV at that time.  She had been seen by the afib clinic on 2/27, and plan was to stop tikosyn and start amiodarone after a 3 day washout. She was suppose to start amiodarone on 06/22/22, but went into afib on 2/29.Patient presented to the ED on 2/29 complaining of atrial fibrillation. With EMS, HR was elevated to the 120s-150s. Upon arrival to the ED, HR was 180 BPM. In the ED, patient complained of palpitations but denied other symptoms. She was started on IV diltiazem in the ED, and cardiology was called to admit   Hospital Course     Consultants: None   Atrial fibrillation with RVR - Patient has a history of difficult to treat atrial fibrillation. She is s/p ablation x2 (most recently in 2021), and more recently has been treated with tikosyn. She continued to have breakthrough afib with RVR while on tikosyn, leading to multiple ER visits. She was seen by the afib clinic on 2/27 with plans to transition from tikosyn to amiodarone on 3/2, but she presented to the ED on 2/29 in afib with RVR  - Patient was started  on IV diltiazem in the ED.  - Dr. Johney Frame discussed with EP-- started IV amiodarone 2/29 in addition to IV diltiazem.  - Patient had been compliant with eliquis, so patient underwent DCCV with conversion to NSR on 3/1. IV dilt and IV amiodarone were stopped after conversion to NSR  - Continue amiodarone 400 mg BID for 5 days. Then, decrease dose to 200 mg BID for 14 days. Then, 200 mg daily thereafter  - Plan for amiodarone long term. AST 17, ALT 13. TSH 1/7 on 05/13/22.  - Continue eliquis 5 mg BID - hemoglobin stable  - Also planning repeat ablation 08/2022   - Patient has a follow up appointment with the afib clinic on 3/14   HTN  - Continue diltiazem 240 mg daily   Did the patient have an acute coronary syndrome (MI, NSTEMI, STEMI, etc) this admission?:  No                               Did the patient have a percutaneous coronary intervention (stent / angioplasty)?:  No.     Patient was seen and examined by Dr. Johney Frame and was deemed stable for discharge.  Patient has a follow up appointment on 3/14 with the afib clinic _____________  Discharge Vitals Blood pressure 124/81, pulse 68, temperature (!) 97.3 F (36.3 C), temperature source Temporal, resp. rate 17, height '5\' 8"'$  (1.727  m), weight 91.6 kg, SpO2 98 %.  Filed Weights   06/20/22 1423 06/21/22 0845  Weight: 93.3 kg 91.6 kg    Labs & Radiologic Studies    CBC Recent Labs    06/20/22 0140 06/21/22 1108  WBC 9.9 6.7  HGB 12.6 12.4  HCT 38.6 37.6  MCV 91.9 91.9  PLT 333 [id number]   Basic Metabolic Panel Recent Labs    06/20/22 0140 06/21/22 1108  NA 136 137  K 3.9 4.3  CL 105 106  CO2 22 21*  GLUCOSE 117* 99  BUN 10 7*  CREATININE 0.91 0.83  CALCIUM 9.6 9.1  MG 2.3  --    Liver Function Tests Recent Labs    06/21/22 1108  AST 17  ALT 13  ALKPHOS 62  BILITOT 0.3  PROT 8.0  ALBUMIN 3.8   No results for input(s): "LIPASE", "AMYLASE" in the last 72 hours. High Sensitivity Troponin:   No results for  input(s): "TROPONINIHS" in the last 720 hours.  BNP Invalid input(s): "POCBNP" D-Dimer No results for input(s): "DDIMER" in the last 72 hours. Hemoglobin A1C No results for input(s): "HGBA1C" in the last 72 hours. Fasting Lipid Panel No results for input(s): "CHOL", "HDL", "LDLCALC", "TRIG", "CHOLHDL", "LDLDIRECT" in the last 72 hours. Thyroid Function Tests No results for input(s): "TSH", "T4TOTAL", "T3FREE", "THYROIDAB" in the last 72 hours.  Invalid input(s): "FREET3" _____________  No results found. Disposition   Pt is being discharged home today in good condition.  Follow-up Plans & Appointments     Follow-up Information      [facility] at [facility] Follow up on 07/04/2022.   Specialty: Cardiology Why: Appointment at 9 AM Contact information: [address] [id number] [place] [phone]               Discharge Instructions     Amb referral to AFIB Clinic   Complete by: As directed    Call MD for:  difficulty breathing, headache or visual disturbances   Complete by: As directed    Call MD for:  extreme fatigue   Complete by: As directed    Call MD for:  persistant dizziness or light-headedness   Complete by: As directed    Call MD for:  severe uncontrolled pain   Complete by: As directed    Diet - low sodium heart healthy   Complete by: As directed    Increase activity slowly   Complete by: As directed         Discharge Medications   Allergies as of 06/21/2022       Reactions   Demerol [meperidine Hcl] Nausea And Vomiting, Other (See Comments)   Triggers migraine.   Pneumococcal Vaccines Other (See Comments)   Triggered A-Fib   Shingrix [zoster Vac Recomb Adjuvanted] Other (See Comments)   Triggered A-Fib   Other Other (See Comments)   Anesthesia- Takes a lot to anesthetize the patient   Prednisone Palpitations, Other (See Comments)   Tachycardia          Medication List     TAKE these medications    acetaminophen 500 MG tablet Commonly known as: TYLENOL Take 500 mg by mouth 2 (two) times daily as needed for mild pain, headache, moderate pain or fever.   amiodarone 200 MG tablet Commonly known as: PACERONE Take 2 tablets (400 mg total) by mouth 2 (two) times daily for 5 days, THEN 1 tablet (200 mg total) 2 (two)  times daily for 14 days, THEN 1 tablet (200 mg total) daily. Start taking on: June 22, 2022 What changed: See the new instructions.   apixaban 5 MG Tabs tablet Commonly known as: Eliquis Take 1 tablet (5 mg total) by mouth 2 (two) times daily.   Butalbital-APAP-Caffeine 50-300-40 MG Caps Take 1 capsule by mouth 3 (three) times daily as needed (migraine).   butorphanol 10 MG/ML nasal spray Commonly known as: STADOL Place 1 spray into the nose every 4 (four) hours as needed for headache or migraine.   cetirizine 10 MG tablet Commonly known as: ZYRTEC Take 10 mg by mouth daily as needed for allergies.   clonazePAM 1 MG tablet Commonly known as: KLONOPIN Take 1 mg by mouth 3 (three) times daily.   diltiazem 240 MG 24 hr capsule Commonly known as: CARDIZEM CD Take 1 capsule (240 mg total) by mouth daily. What changed: when to take this   diltiazem 30 MG tablet Commonly known as: CARDIZEM Take 1 Tablet Every 4 Hours As Needed For HR >100 What changed:  how much to take how to take this when to take this reasons to take this additional instructions   fluticasone-salmeterol 115-21 MCG/ACT inhaler Commonly known as: ADVAIR HFA Inhale 2 puffs into the lungs 2 (two) times daily as needed (asthma).   furosemide 20 MG tablet Commonly known as: LASIX TAKE 1 TABLET BY MOUTH EVERY DAY AS NEEDED FOR EDEMA What changed: See the new instructions.   Klor-Con M20 20 MEQ tablet Generic drug: potassium chloride SA TAKE 1 TABLET BY MOUTH EVERY DAY What changed:  how much to take when to take this   pravastatin 40 MG  tablet Commonly known as: PRAVACHOL Take 40 mg by mouth at bedtime.   promethazine 25 MG tablet Commonly known as: PHENERGAN Take 25 mg by mouth every 6 (six) hours as needed for nausea or vomiting.   sertraline 50 MG tablet Commonly known as: ZOLOFT Take 50 mg by mouth at bedtime.   Ventolin HFA 108 (90 Base) MCG/ACT inhaler Generic drug: albuterol Inhale 2 puffs into the lungs every 6 (six) hours as needed for wheezing or shortness of breath.           Outstanding Labs/Studies     Duration of Discharge Encounter   Greater than 30 minutes including physician time.  Signed, Margie Billet, PA-C 06/21/2022, 1:19 PM   Patient seen and examined and agree with [name], PA-C as detailed above. Please see progress note from today for further details.  Gwyndolyn Kaufman, MD

## 2022-06-24 ENCOUNTER — Encounter (HOSPITAL_COMMUNITY): Payer: Self-pay | Admitting: Internal Medicine

## 2022-07-03 ENCOUNTER — Telehealth: Payer: Self-pay

## 2022-07-03 NOTE — Progress Notes (Signed)
[facility])  [place] Team   Reason for referral: Medication assistance - [place] pharmacy referral is being closed due to the following reasons:  -Patient was outreached on 07/03/22. She declines medication assistance at this time and would like no further outreach from the pharmacy team.   [place] is happy to assist the patient/family in the future for clinical pharmacy needs, following a discussion from your team about [place] outreach. Thank you for allowing [facility] to be a part of your patient's care.    Thanks,  Reed Breech, [place] [phone]

## 2022-07-04 ENCOUNTER — Ambulatory Visit (HOSPITAL_COMMUNITY)
Admission: RE | Admit: 2022-07-04 | Discharge: 2022-07-04 | Disposition: A | Discharge status: Home / Self Care | Payer: Medicare Other | Source: Ambulatory Visit | Attending: Physician Assistant | Admitting: Physician Assistant

## 2022-07-04 VITALS — BP 136/80 | HR 74 | Ht 68.0 in | Wt 203.2 lb

## 2022-07-04 DIAGNOSIS — D6869 Other thrombophilia: Secondary | ICD-10-CM | POA: Diagnosis not present

## 2022-07-04 DIAGNOSIS — Z7901 Long term (current) use of anticoagulants: Secondary | ICD-10-CM | POA: Insufficient documentation

## 2022-07-04 DIAGNOSIS — I10 Essential (primary) hypertension: Secondary | ICD-10-CM | POA: Diagnosis not present

## 2022-07-04 DIAGNOSIS — I4819 Other persistent atrial fibrillation: Secondary | ICD-10-CM | POA: Diagnosis not present

## 2022-07-04 DIAGNOSIS — Z5181 Encounter for therapeutic drug level monitoring: Secondary | ICD-10-CM

## 2022-07-04 DIAGNOSIS — Z79899 Other long term (current) drug therapy: Secondary | ICD-10-CM | POA: Diagnosis not present

## 2022-07-04 NOTE — Progress Notes (Signed)
Primary Care Physician: Harvie Junior, MD Referring Physician: Dr. Rayann Heman Primary EP: Dr Myles Gip   Nicole [name] is a 72 y.o. adult with a h/o anemia,  afib, and syncope possibly 2/2 termination pause  that presented to his office with afib with RVR at 160 bpm and was admitted for Tikosyn load. Converted to SR without pause. Qtc remained stable. She is on Eliquis for a CHADS2VASC score of 3. She is s/p repeat afib ablation with Dr Rayann Heman on 03/14/20. Patient had tachypalpitations on 02/27/21 and took one of her PRN diltiazem which did not resolve her symptoms. EMS was called and ECG conformed afib with RVR. She was given IV diltiazem which converted her to SR. Patient does report that he niece has passed away the day before the onset of her symptoms. She has had 3 episodes of afib since the start of 2023 and went to the ED 05/09/21. She spontaneously converted en route. She was seen by Dr Curt Bears 06/18/21 and her BB was increased.   At her visit 11/20/21, patient reported that she had been feeling "terrible". She becomes very SOB with minimal exertion. CBC showed severe anemia with Hgb 5.2, she was sent to the ED for evaluation and transfusion, given 2 units PRBCs. Patient underwent EGD/pill endoscopy and colonoscopy. Eliquis was held initially due to suspected GI bleed. Dr. Lyndel Safe updated the patient on the results of her pill endoscopy on the afternoon of 11/23/2021 noting two areas of nonactive bleeding in her small intestine that could explain her positive FOBT. Patient reports that she felt better almost immediately after her first transfusion.   Patient was seen at the ED 02/01/22 with rapid afib in the setting of a UTI. She woke that morning with palpitations and checked her Kardia mobile and found to be A-fib RVR with rate in the low 200s.  She took a dose of Cardizem 30 mg at 1 AM and a repeat dose of Cardizem 30 mg at 5 in the morning and also took her home Eliquis dose and Tikosyn dose.  When  EMS got to her, she was A-fib RVR 120-140s bpm. She was back in SR on arrival to the ED.   Patient was again seen at the ED for afib with RVR and underwent DCCV on 05/13/22. There were no specific triggers that she could identify. No current bleeding issues on anticoagulation. Hgb 13.0.  Patient seen by Dr Myles Gip and is planning for repeat ablation. Unfortunately, she had rapid afib again and underwent DCCV in the ED on 06/11/22. She took her PRN diltiazem with no improvement. When EMS arrived her heart rate was up to 180 bpm.   Her dofetilide was stopped with plans to change to amiodarone but she developed rapid afib again during the 3 day washout period. She was admitted and loaded on IV amiodarone with another DCCV on 06/20/22.  On follow up today, patient reports that she feels "great". She has not had any further afib since discharge. She is tolerating the amiodarone without difficulty.   Today, she denies symptoms of palpitations, CP, orthopnea, PND, dizziness, presyncope, syncope, or neurologic sequela.  otherwise without complaint today.   Past Medical History:  Diagnosis Date   Anticoagulant long-term use 11/16/2014   Anxiety 10/06/2017   Arthritis 10/06/2017   knees bilateral   Asthma 11/16/2014   Esophageal reflux 11/16/2014   Essential hypertension 08/15/2015   pt denies   H/O: GI bleed 12/11/2016   Hyperlipidemia 11/17/2014   Migraine headache 11/16/2014  Nausea 11/16/2014   Pain in limb 11/16/2014   Panic disorder 11/16/2014   Paroxysmal atrial fibrillation ([place]) 11/17/2014   Swelling of joint 11/16/2014   Symptomatic anemia 11/24/2016   Tendonitis 11/16/2014   UTI (urinary tract infection) 12/19/2015   Past Surgical History:  Procedure Laterality Date   ABDOMINAL HYSTERECTOMY     ATRIAL FIBRILLATION ABLATION N/A 03/14/2020   Procedure: ATRIAL FIBRILLATION ABLATION;  Surgeon: [name], MD;  Location: [place] [facility];  Service: Cardiovascular;  Laterality: N/A;   BIOPSY   11/22/2021   Procedure: BIOPSY;  Surgeon: Daryel November, MD;  Location: [place];  Service: Gastroenterology;;   CARDIOVERSION     CARDIOVERSION N/A 06/21/2022   Procedure: CARDIOVERSION;  Surgeon: Pixie Casino, MD;  Location: [place];  Service: Cardiovascular;  Laterality: N/A;   CESAREAN SECTION     COLONOSCOPY     COLONOSCOPY N/A 11/22/2021   Procedure: COLONOSCOPY;  Surgeon: Daryel November, MD;  Location: [place];  Service: Gastroenterology;  Laterality: N/A;   ESOPHAGOGASTRODUODENOSCOPY (EGD) WITH PROPOFOL N/A 11/22/2021   Procedure: ESOPHAGOGASTRODUODENOSCOPY (EGD) WITH PROPOFOL;  Surgeon: Daryel November, MD;  Location: [place];  Service: Gastroenterology;  Laterality: N/A;   GIVENS CAPSULE STUDY N/A 11/22/2021   Procedure: GIVENS CAPSULE STUDY;  Surgeon: Daryel November, MD;  Location: [place];  Service: Gastroenterology;  Laterality: N/A;   LOOP RECORDER INSERTION N/A 02/04/2018   Procedure: LOOP RECORDER INSERTION;  Surgeon: [name], MD;  Location: [place] [facility];  Service: Cardiovascular;  Laterality: N/A;   POLYPECTOMY  11/22/2021   Procedure: POLYPECTOMY;  Surgeon: Daryel November, MD;  [facility];  Service: Gastroenterology;;   ROTATOR CUFF REPAIR Left     Current Outpatient Medications  Medication Sig Dispense Refill   acetaminophen (TYLENOL) 500 MG tablet Take 500 mg by mouth 2 (two) times daily as needed for mild pain, headache, moderate pain or fever.     amiodarone (PACERONE) 200 MG tablet Take 2 tablets (400 mg total) by mouth 2 (two) times daily for 5 days, THEN 1 tablet (200 mg total) 2 (two) times daily for 14 days, THEN 1 tablet (200 mg total) daily. 90 tablet 0   apixaban (ELIQUIS) 5 MG TABS tablet Take 1 tablet (5 mg total) by mouth 2 (two) times daily. 180 tablet 1   Butalbital-APAP-Caffeine 50-300-40 MG CAPS Take 1 capsule by mouth 3 (three) times daily as needed (migraine).     butorphanol (STADOL) 10  MG/ML nasal spray Place 1 spray into the nose every 4 (four) hours as needed for headache or migraine.      cetirizine (ZYRTEC) 10 MG tablet Take 10 mg by mouth daily as needed for allergies.     clonazePAM (KLONOPIN) 1 MG tablet Take 1 mg by mouth 3 (three) times daily.     diltiazem (CARDIZEM CD) 240 MG 24 hr capsule Take 1 capsule (240 mg total) by mouth daily. (Patient taking differently: Take 240 mg by mouth at bedtime.) 30 capsule 3   diltiazem (CARDIZEM) 30 MG tablet Take 1 Tablet Every 4 Hours As Needed For HR >100 (Patient taking differently: Take 30 mg by mouth every 4 (four) hours as needed (HR > 100).) 45 tablet 1   fluticasone-salmeterol (ADVAIR HFA) 115-21 MCG/ACT inhaler Inhale 2 puffs into the lungs 2 (two) times daily as needed (asthma).     furosemide (LASIX) 20 MG tablet TAKE 1 TABLET BY MOUTH EVERY DAY AS NEEDED FOR EDEMA (Patient taking  differently: Take 20 mg by mouth daily as needed for edema.) 90 tablet 3   KLOR-CON M20 20 MEQ tablet TAKE 1 TABLET BY MOUTH EVERY DAY (Patient taking differently: Take 20 mEq by mouth at bedtime.) 90 tablet 3   pravastatin (PRAVACHOL) 40 MG tablet Take 40 mg by mouth at bedtime.      promethazine (PHENERGAN) 25 MG tablet Take 25 mg by mouth every 6 (six) hours as needed for nausea or vomiting.     sertraline (ZOLOFT) 50 MG tablet Take 50 mg by mouth at bedtime.     VENTOLIN HFA 108 (90 Base) MCG/ACT inhaler Inhale 2 puffs into the lungs every 6 (six) hours as needed for wheezing or shortness of breath.     No current facility-administered medications for this encounter.    Allergies  Allergen Reactions   Demerol [Meperidine Hcl] Nausea And Vomiting and Other (See Comments)    Triggers migraine.   Pneumococcal Vaccines Other (See Comments)    Triggered A-Fib   Shingrix [Zoster Vac Recomb Adjuvanted] Other (See Comments)    Triggered A-Fib   Other Other (See Comments)    Anesthesia- Takes a lot to anesthetize the patient   Prednisone  Palpitations and Other (See Comments)    Tachycardia     Social History   Socioeconomic History   Marital status: Married    Spouse name: Not on file   Number of children: Not on file   Years of education: Not on file   Highest education level: Not on file  Occupational History   Not on file  Tobacco Use   Smoking status: Never   Smokeless tobacco: Never   Tobacco comments:    Never smoke 07/26/21  Vaping Use   Vaping Use: Never used  Substance and Sexual Activity   Alcohol use: Never   Drug use: Never   Sexual activity: Not on file  Other Topics Concern   Not on file  Social History Narrative   Lives in [place] with spouse   Retired from school system.   Social Determinants of Health   Financial Resource Strain: Not on file  Food Insecurity: No Food Insecurity (06/20/2022)   Hunger Vital Sign    Worried About Running Out of Food in the Last Year: Never true    Ran Out of Food in the Last Year: Never true  Transportation Needs: No Transportation Needs (06/20/2022)   PRAPARE - Hydrologist (Medical): No    Lack of Transportation (Non-Medical): No  Physical Activity: Not on file  Stress: Not on file  Social Connections: Not on file  Intimate Partner Violence: Not At Risk (06/20/2022)   Humiliation, Afraid, Rape, and Kick questionnaire    Fear of Current or Ex-Partner: No    Emotionally Abused: No    Physically Abused: No    Sexually Abused: No    Family History  Problem Relation Age of Onset   Heart attack Mother    Heart disease Mother    Heart disease Sister    Heart disease Sister    Heart disease Sister    Heart disease Sister    Heart disease Sister     ROS- All systems are reviewed and negative except as per the HPI above  Physical Exam: Vitals:   07/04/22 0902  BP: 136/80  Pulse: 74  Weight: 92.2 kg  Height: '5\' 8"'$  (1.727 m)      Wt Readings from Last 3 Encounters:  07/04/22  92.2 kg  06/21/22 91.6 kg   06/18/22 93.4 kg    Labs: Lab Results  Component Value Date   NA 137 06/21/2022   K 4.3 06/21/2022   CL 106 06/21/2022   CO2 21 (L) 06/21/2022   GLUCOSE 99 06/21/2022   BUN 7 (L) 06/21/2022   CREATININE 0.83 06/21/2022   CALCIUM 9.1 06/21/2022   MG 2.3 06/20/2022   Lab Results  Component Value Date   INR 1.3 (H) 06/20/2022   No results found for: "CHOL", "HDL", "LDLCALC", "TRIG"   GEN- The patient is a well appearing female, alert and oriented x 3 today.   HEENT-head normocephalic, atraumatic, sclera clear, conjunctiva pink, hearing intact, trachea midline. Lungs- Clear to ausculation bilaterally, normal work of breathing Heart- Regular rate and rhythm, no murmurs, rubs or gallops  GI- soft, NT, ND, + BS Extremities- no clubbing, cyanosis, or edema MS- no significant deformity or atrophy Skin- no rash or lesion Psych- euthymic mood, full affect Neuro- strength and sensation are intact   EKG today demonstrates  SR Vent. rate 74 BPM PR interval 194 ms QRS duration 86 ms QT/QTcB 372/412 ms   Echo 12/06/21 demonstrated   1. Left ventricular ejection fraction, by estimation, is 60 to 65%. The  left ventricle has normal function. The left ventricle has no regional  wall motion abnormalities. There is mild asymmetric left ventricular  hypertrophy of the septal segment. Left ventricular diastolic parameters are consistent with Grade II diastolic dysfunction (pseudonormalization). Elevated left ventricular end-diastolic pressure.   2. Right ventricular systolic function is normal. The right ventricular  size is normal. There is normal pulmonary artery systolic pressure.   3. The mitral valve is normal in structure. Trivial mitral valve  regurgitation. No evidence of mitral stenosis.   4. The aortic valve is tricuspid. Aortic valve regurgitation is trivial.  No aortic stenosis is present.   5. Aortic dilatation noted. There is mild dilatation of the aortic root,   measuring 38 mm. There is borderline dilatation of the ascending aorta,  measuring 36 mm.   6. The inferior vena cava is normal in size with greater than 50%  respiratory variability, suggesting right atrial pressure of 3 mmHg.    Epic records reviewed   CHA2DS2-VASc Score = 3  The patient's score is based upon: CHF History: 0 HTN History: 1 Diabetes History: 0 Stroke History: 0 Vascular Disease History: 0 Age Score: 1 Gender Score: 1       ASSESSMENT AND PLAN: 1. Persistent atrial fibrillation  The patient's CHA2DS2-VASc score is 3, indicating a 3.2% annual risk of stroke.   S/p repeat ablation with Dr Rayann Heman 03/14/20 [facility] at [facility].  Failed dofetilide (ineffective)  S/p DCCV 06/21/22 after IV loading on amiodarone.  Continue amiodarone 200 mg BID, decrease to once daily on 07/10/22. Scheduled for repeat ablation with Dr Myles Gip on 09/05/22. Continue diltiazem 240 mg daily with 30 mg PRN q 4hrs for heart racing. Continue Eliquis 5 mg BID Will plan to check cmet/TSH at follow up post ablation.   2. Secondary Hypercoagulable State (ICD10:  D68.69) The patient is at significant risk for stroke/thromboembolism based upon her CHA2DS2-VASc Score of 3.  Continue Apixaban (Eliquis). If she has recurrent anemia, could consider referral for [facility].   3. HTN Stable, no changes today.   Follow up for ablation as scheduled.    [facility] [address] [place], [place] [address] [phone]

## 2022-07-15 ENCOUNTER — Other Ambulatory Visit: Payer: Self-pay

## 2022-07-15 ENCOUNTER — Encounter (HOSPITAL_COMMUNITY): Payer: Self-pay

## 2022-07-15 ENCOUNTER — Emergency Department (HOSPITAL_COMMUNITY)
Admission: EM | Admit: 2022-07-15 | Discharge: 2022-07-15 | Disposition: A | Discharge status: Home / Self Care | Payer: Medicare Other | Attending: Emergency Medicine | Admitting: Emergency Medicine

## 2022-07-15 ENCOUNTER — Emergency Department (HOSPITAL_COMMUNITY): Payer: Medicare Other

## 2022-07-15 DIAGNOSIS — Z7901 Long term (current) use of anticoagulants: Secondary | ICD-10-CM | POA: Insufficient documentation

## 2022-07-15 DIAGNOSIS — I4891 Unspecified atrial fibrillation: Secondary | ICD-10-CM | POA: Diagnosis not present

## 2022-07-15 DIAGNOSIS — R079 Chest pain, unspecified: Secondary | ICD-10-CM | POA: Diagnosis not present

## 2022-07-15 DIAGNOSIS — I10 Essential (primary) hypertension: Secondary | ICD-10-CM | POA: Diagnosis not present

## 2022-07-15 DIAGNOSIS — R0789 Other chest pain: Secondary | ICD-10-CM | POA: Diagnosis not present

## 2022-07-15 DIAGNOSIS — R000 Tachycardia, unspecified: Secondary | ICD-10-CM | POA: Diagnosis not present

## 2022-07-15 DIAGNOSIS — R42 Dizziness and giddiness: Secondary | ICD-10-CM | POA: Insufficient documentation

## 2022-07-15 LAB — BASIC METABOLIC PANEL
Anion gap: 9 (ref 5–15)
BUN: 10 mg/dL (ref 8–23)
CO2: 23 mmol/L (ref 22–32)
Calcium: 8.8 mg/dL — ABNORMAL LOW (ref 8.9–10.3)
Chloride: 105 mmol/L (ref 98–111)
Creatinine, Ser: 1.03 mg/dL — ABNORMAL HIGH (ref 0.44–1.00)
GFR, Estimated: 58 mL/min — ABNORMAL LOW (ref >60)
Glucose, Bld: 142 mg/dL — ABNORMAL HIGH (ref 70–99)
Potassium: 4.1 mmol/L (ref 3.5–5.1)
Sodium: 137 mmol/L (ref 135–145)

## 2022-07-15 LAB — CBC
HCT: 32.1 % — ABNORMAL LOW (ref 36.0–46.0)
Hemoglobin: 9.9 g/dL — ABNORMAL LOW (ref 12.0–15.0)
MCH: 28.5 pg (ref 26.0–34.0)
MCHC: 30.8 g/dL (ref 30.0–36.0)
MCV: 92.5 fL (ref 80.0–100.0)
Platelets: 292 10*3/uL (ref 150–400)
RBC: 3.47 MIL/uL — ABNORMAL LOW (ref 3.87–5.11)
RDW: 12.7 % (ref 11.5–15.5)
WBC: 4.3 10*3/uL (ref 4.0–10.5)
nRBC: 0 % (ref 0.0–0.2)

## 2022-07-15 LAB — TROPONIN I (HIGH SENSITIVITY): Troponin I (High Sensitivity): 3 ng/L (ref <18)

## 2022-07-15 MED ORDER — PROPOFOL 10 MG/ML IV BOLUS
0.8000 mg/kg | Freq: Once | INTRAVENOUS | Status: DC
  Filled 2022-07-15: qty 20

## 2022-07-15 MED ORDER — PROPOFOL 10 MG/ML IV BOLUS
1.0000 mg/kg | Freq: Once | INTRAVENOUS | Status: AC
  Administered 2022-07-15: 92 mg via INTRAVENOUS

## 2022-07-15 NOTE — Discharge Instructions (Addendum)
Please follow-up with your cardiology clinic.

## 2022-07-15 NOTE — ED Triage Notes (Signed)
Pt to ED via EMS from home with c/o cp, SOB. EMS states upon arrival pt was in Afib RVR with rates of 140s. Per EMS pt states the last time she was in afib she had to be cardioverted. Pt arrives Aox4, reports feeling like an elephant is sitting on her chest, states symptoms started today while watching tv. Pt reports cardiac history.

## 2022-07-15 NOTE — ED Provider Notes (Signed)
[facility] Provider Note   CSN: [id number] Arrival date & time: 07/15/22  1605     History {Add pertinent medical, surgical, social history, OB history to HPI:1} Chief Complaint  Patient presents with   Chest Pain    Nicole [name] is a 72 y.o. adult w/ hx of paroxysmal A fib on eliquis, amiodarone, cardiazem presenting to ED with palpitations.  Patient reports that she was at rest today and went back into A-fib.  She can feel her heart rate elevated.  She feels palpitations and sometimes some dizziness.  She was admitted to the hospital in February for A-fib with RVR, on amiodarone at that time.  She has had multiple cardioversions in the past are generally successful.  She was weaned off of Tikosyn in the hospital and now taking amiodarone.  She has a another cardioversion scheduled with EP in May.  She reports she does drink caffeine.  HPI     Home Medications Prior to Admission medications   Medication Sig Start Date End Date Taking? Authorizing Provider  acetaminophen (TYLENOL) 500 MG tablet Take 500 mg by mouth 2 (two) times daily as needed for mild pain, headache, moderate pain or fever.    [provider]  amiodarone (PACERONE) 200 MG tablet Take 2 tablets (400 mg total) by mouth 2 (two) times daily for 5 days, THEN 1 tablet (200 mg total) 2 (two) times daily for 14 days, THEN 1 tablet (200 mg total) daily. 06/22/22 05/31/23  Margie Billet, PA-C  apixaban (ELIQUIS) 5 MG TABS tablet Take 1 tablet (5 mg total) by mouth 2 (two) times daily. 04/23/22   Camnitz, Will Hassell Done, MD  Butalbital-APAP-Caffeine 50-300-40 MG CAPS Take 1 capsule by mouth 3 (three) times daily as needed (migraine). 04/17/22   [provider]  butorphanol (STADOL) 10 MG/ML nasal spray Place 1 spray into the nose every 4 (four) hours as needed for headache or migraine.     [provider]  cetirizine (ZYRTEC) 10 MG tablet Take 10 mg by mouth  daily as needed for allergies.    [provider]  clonazePAM (KLONOPIN) 1 MG tablet Take 1 mg by mouth 3 (three) times daily.    [provider]  diltiazem (CARDIZEM CD) 240 MG 24 hr capsule Take 1 capsule (240 mg total) by mouth daily. Patient taking differently: Take 240 mg by mouth at bedtime. 05/21/22   Fenton, Clint R, PA  diltiazem (CARDIZEM) 30 MG tablet Take 1 Tablet Every 4 Hours As Needed For HR >100 Patient taking differently: Take 30 mg by mouth every 4 (four) hours as needed (HR > 100). 05/29/22   Fenton, Clint R, PA  fluticasone-salmeterol (ADVAIR HFA) 115-21 MCG/ACT inhaler Inhale 2 puffs into the lungs 2 (two) times daily as needed (asthma).    [provider]  furosemide (LASIX) 20 MG tablet TAKE 1 TABLET BY MOUTH EVERY DAY AS NEEDED FOR EDEMA Patient taking differently: Take 20 mg by mouth daily as needed for edema. 07/23/21   Camnitz, Will Hassell Done, MD  KLOR-CON M20 20 MEQ tablet TAKE 1 TABLET BY MOUTH EVERY DAY Patient taking differently: Take 20 mEq by mouth at bedtime. 11/28/21   Camnitz, Ocie Doyne, MD  pravastatin (PRAVACHOL) 40 MG tablet Take 40 mg by mouth at bedtime.  11/30/18   [provider]  promethazine (PHENERGAN) 25 MG tablet Take 25 mg by mouth every 6 (six) hours as needed for nausea or vomiting.  [provider]  sertraline (ZOLOFT) 50 MG tablet Take 50 mg by mouth at bedtime. 06/11/21   [provider]  VENTOLIN HFA 108 (90 Base) MCG/ACT inhaler Inhale 2 puffs into the lungs every 6 (six) hours as needed for wheezing or shortness of breath.    [provider]      Allergies    Demerol [meperidine hcl], Pneumococcal vaccines, Shingrix [zoster vac recomb adjuvanted], Other, and Prednisone    Review of Systems   Review of Systems  Physical Exam Updated Vital Signs BP (!) 170/108   Pulse (!) 130   Temp 98.1 F (36.7 C) (Oral)   Resp (!) 22   Ht 5\' 8"  (1.727 m)   Wt 92 kg   SpO2 100%   BMI 30.84  kg/m  Physical Exam Constitutional:      General: She is not in acute distress. HENT:     Head: Normocephalic and atraumatic.  Eyes:     Conjunctiva/sclera: Conjunctivae normal.     Pupils: Pupils are equal, round, and reactive to light.  Cardiovascular:     Rate and Rhythm: Tachycardia present. Rhythm irregular.  Pulmonary:     Effort: Pulmonary effort is normal. No respiratory distress.  Abdominal:     General: There is no distension.     Tenderness: There is no abdominal tenderness.  Skin:    General: Skin is warm and dry.  Neurological:     General: No focal deficit present.     Mental Status: She is alert. Mental status is at baseline.  Psychiatric:        Mood and Affect: Mood normal.        Behavior: Behavior normal.     ED Results / Procedures / Treatments   Labs (all labs ordered are listed, but only abnormal results are displayed) Labs Reviewed  CBC - Abnormal; Notable for the following components:      Result Value   RBC 3.47 (*)    Hemoglobin 9.9 (*)    HCT 32.1 (*)    All other components within normal limits  BASIC METABOLIC PANEL  MAGNESIUM  POC OCCULT BLOOD, ED  TROPONIN I (HIGH SENSITIVITY)    EKG EKG Interpretation  Date/Time:  Monday July 15 2022 16:18:05 EDT Ventricular Rate:  132 PR Interval:    QRS Duration: 94 QT Interval:  327 QTC Calculation: 485 R Axis:   87 Text Interpretation: Atrial fibrillation Borderline right axis deviation Borderline ST depression, diffuse leads Confirmed by Octaviano Glow [id number]) on 07/15/2022 4:21:53 PM  Radiology DG Chest Portable 1 View  Result Date: 07/15/2022 CLINICAL DATA:  Chest pain. EXAM: PORTABLE CHEST 1 VIEW COMPARISON:  May 13, 2022. FINDINGS: The heart size and mediastinal contours are within normal limits. Both lungs are clear. The visualized skeletal structures are unremarkable. IMPRESSION: No active disease. Electronically Signed   By: Marijo Conception M.D.   On: 07/15/2022 16:53     Procedures Procedures  {Document cardiac monitor, telemetry assessment procedure when appropriate:1}  Medications Ordered in ED Medications  propofol (DIPRIVAN) 10 mg/mL bolus/IV push 73.6 mg (has no administration in time range)    ED Course/ Medical Decision Making/ A&P   {   Click here for ABCD2, HEART and other calculatorsREFRESH Note before signing :1}                          Medical Decision Making Amount and/or Complexity of Data Reviewed Labs: ordered.  Radiology: ordered.   This patient presents to the ED with concern for A-fib with RVR. This involves an extensive number of treatment options, and is a complaint that carries with it a high risk of complications and morbidity.    Co-morbidities that complicate the patient evaluation: History of A-fib at high risk of RVR complication or exacerbation   External records from outside source obtained and reviewed including cardiology outpatient medical records  I ordered and personally interpreted labs.  The pertinent results include: Troponin and electrolytes are unremarkable.    I discussed with the patient her hemoglobin drop of nearly 2 units since her last checked 3 weeks ago.  She denies dark or tarry stools.  She reports she has had a history of suspected GI bleed in the past for which she was put on iron for several months, her hemoglobin improved back to normal and she was taken off of iron.  She does not feel lightheaded.  I advised we perform rectal exam but she does not want to do a rectal exam at this time. She states that she will restart her iron tablets and have her doctor recheck her hemoglobin in close follow up, which is reasonable.    I ordered imaging studies including x-ray of the chest I independently visualized and interpreted imaging which showed no emergent findings I agree with the radiologist interpretation  The patient was maintained on a cardiac monitor.  I personally viewed and interpreted the  cardiac monitored which showed an underlying rhythm of: A-fib with RVR and a heart rate of 120 to 140 bpm  Per my interpretation the patient's ECG shows A-fib with RVR with no acute ischemic   I have reviewed the patients home medicines and have made adjustments as needed  Test Considered: I have a lower suspicion for acute pulmonary embolism and do not see an indication for CT angiogram.  I discussed the case on the phone with the cardiologist Dr Percival Spanish who agrees that there is no direct contraindication to attempting cardioversion in the emergency department.  The patient has been compliant with all of her home medications.  The patient would prefer to attempt cardioversion.  Alternatives were discussed including hospitalization for IV rate control, but she would prefer attempted cardioversion.  Her husband was also present for this discussion.  They understand the risks and benefits of cardioversion, with the risks of cardioversion and sedation including, but not limited to, respiratory depression, allergic reaction, hypotension, arrhythmia, failed cardioversion, and death.  After the interventions noted above, I reevaluated the patient and found that they have: {resolved/improved/worsened:23923::"improved"}  Dispostion:  After consideration of the diagnostic results and the patients response to treatment, I feel that the patent would benefit from ***.   {Document critical care time when appropriate:1} {Document review of labs and clinical decision tools ie heart score, Chads2Vasc2 etc:1}  {Document your independent review of radiology images, and any outside records:1} {Document your discussion with family members, caretakers, and with consultants:1} {Document social determinants of health affecting pt's care:1} {Document your decision making why or why not admission, treatments were needed:1} Final Clinical Impression(s) / ED Diagnoses Final diagnoses:  None    Rx / DC Orders ED  Discharge Orders     None

## 2022-07-15 NOTE — ED Notes (Signed)
Cardioversion consent signed.

## 2022-07-15 NOTE — ED Notes (Signed)
Cardioversion  Pads placed on pt, monitor on and working, [place] applied @ 4L. RT, MD and PA at bedside.   Timeout- 1813- Pt verification complete, procedure verification completed by MD  [id number]- Propofol given 92mg , Pt sedated 1815- Afib HR 120S 1815- BP 106/72 O2 100% 4L  1816- Pt shocked @200J  1817- NSR hr 61 1818- Bp 108/58 O2 sat 100% 4L  1824: Pt alert but drowsy able to state her name, O2 sat 100% 4L [place]

## 2022-07-17 ENCOUNTER — Telehealth: Payer: Self-pay

## 2022-07-17 NOTE — Telephone Encounter (Signed)
     Patient  visit on 07/17/2022  at [facility]. [facility] was for chest pain.  Have you been able to follow up with your primary care physician? Patient has appointment with Cardiologist.  The patient was or was not able to obtain any needed medicine or equipment. No medication prescribed.  Are there diet recommendations that you are having difficulty following? No  Patient expresses understanding of discharge instructions and education provided has no other needs at this time. Yes   [place] Resource Care Guide   [email or web address]  ?? [email or web address]   Website: [email or web address]  [email or web address]

## 2022-07-22 ENCOUNTER — Ambulatory Visit: Payer: Medicare Other | Attending: Cardiovascular Disease | Admitting: Cardiovascular Disease

## 2022-07-22 ENCOUNTER — Encounter: Payer: Self-pay | Admitting: Cardiovascular Disease

## 2022-07-22 VITALS — BP 140/86 | HR 70 | Ht 68.0 in | Wt 210.0 lb

## 2022-07-22 DIAGNOSIS — Z79899 Other long term (current) drug therapy: Secondary | ICD-10-CM | POA: Insufficient documentation

## 2022-07-22 DIAGNOSIS — I4819 Other persistent atrial fibrillation: Secondary | ICD-10-CM | POA: Insufficient documentation

## 2022-07-22 LAB — CBC
Hematocrit: 30.1 % — ABNORMAL LOW (ref 34.0–46.6)
Hemoglobin: 9.8 g/dL — ABNORMAL LOW (ref 11.1–15.9)
MCH: 28.2 pg (ref 26.6–33.0)
MCHC: 32.6 g/dL (ref 31.5–35.7)
MCV: 87 fL (ref 79–97)
Platelets: 272 10*3/uL (ref 150–450)
RBC: 3.48 x10E6/uL — ABNORMAL LOW (ref 3.77–5.28)
RDW: 15.6 % — ABNORMAL HIGH (ref 11.7–15.4)
WBC: 4.7 10*3/uL (ref 3.4–10.8)

## 2022-07-22 MED ORDER — FERROUS SULFATE 324 MG PO TBEC: 324.0000 mg | DELAYED_RELEASE_TABLET | Freq: Every day | ORAL | 3 refills | Status: DC

## 2022-07-22 MED ORDER — DILTIAZEM HCL 30 MG PO TABS: ORAL_TABLET | ORAL | 1 refills | Status: DC

## 2022-07-22 MED ORDER — DILTIAZEM HCL ER COATED BEADS 360 MG PO CP24: 360.0000 mg | ORAL_CAPSULE | Freq: Every day | ORAL | 3 refills | Status: DC

## 2022-07-22 NOTE — Patient Instructions (Signed)
Medication Instructions:  INCREASE Diltiazem to 360mg  once daily  START Ferrous Sulfate 324 once daily (Iron tablet OTC) *If you need a refill on your cardiac medications before your next appointment, please call your pharmacy*   Lab Work: CBC TODAY If you have labs (blood work) drawn today and your tests are completely normal, you will receive your results only by: [place] (if you have MyChart) OR A paper copy in the mail If you have any lab test that is abnormal or we need to change your treatment, we will call you to review the results.    Follow-Up: At [facility], you and your health needs are our priority.  As part of our continuing mission to provide you with exceptional heart care, we have created designated Provider Care Teams.  These Care Teams include your primary Cardiologist (physician) and Advanced Practice Providers (APPs -  Physician Assistants and Nurse Practitioners) who all work together to provide you with the care you need, when you need it.  We recommend signing up for the patient portal called "MyChart".  Sign up information is provided on this After Visit Summary.  MyChart is used to connect with patients for Virtual Visits (Telemedicine).  Patients are able to view lab/test results, encounter notes, upcoming appointments, etc.  Non-urgent messages can be sent to your provider as well.   To learn more about what you can do with MyChart, go to [email or web address].    Your next appointment:   As needed after ablation   Provider:   Doralee Albino, MD

## 2022-07-22 NOTE — Progress Notes (Signed)
Electrophysiology Office Note:    Date:  07/22/2022   ID:  Nicole [name], DOB 12-Aug-1950, MRN [id number]  PCP:  Harvie Junior, MD   [facility] Providers Cardiologist:  None Electrophysiologist:  Melida Quitter, MD     Referring MD: Harvie Junior, MD   History of Present Illness:    Nicole [name] is a 72 y.o. adult with a hx listed below, significant for atrial fibrillation s/p ablation and Tikosyn, referred for arrhythmia management.  She underwent a repeat AF ablation by Dr. Rayann Heman in Nov 2021. 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 I reviewed the ECGs from the recent visits. They do appear [name] show AF rather than an atypical flutter.  She is very hesitant [name] consider a convergent procedure.  Since her last visit with me, she had recurrence of rapid atrial fibrillation resulting in ER visit with DC cardioversion in February 2024.  She was loaded with IV amiodarone and had another DC cardioversion on fibber 29th.  Today, she reports that she is feeling well and has no acute complaints - dyspnea, chest pain, syncope, palpitations.    Past Medical History:  Diagnosis Date   Anticoagulant long-term use 11/16/2014   Anxiety 10/06/2017   Arthritis 10/06/2017   knees bilateral   Asthma 11/16/2014   Esophageal reflux 11/16/2014   Essential hypertension 08/15/2015   pt denies   H/O: GI bleed 12/11/2016   Hyperlipidemia 11/17/2014   Migraine headache 11/16/2014   Nausea 11/16/2014   Pain in limb 11/16/2014   Panic disorder 11/16/2014   Paroxysmal atrial fibrillation ([place]) 11/17/2014   Swelling of joint 11/16/2014   Symptomatic anemia 11/24/2016   Tendonitis 11/16/2014   UTI (urinary tract infection) 12/19/2015    Past Surgical History:  Procedure  Laterality Date   ABDOMINAL HYSTERECTOMY     ATRIAL FIBRILLATION ABLATION N/A 03/14/2020   Procedure: ATRIAL FIBRILLATION ABLATION;  Surgeon: [name], MD;  Location: [place] [facility];  Service: Cardiovascular;  Laterality: N/A;   BIOPSY  11/22/2021   Procedure: BIOPSY;  Surgeon: Daryel November, MD;  Location: [place];  Service: Gastroenterology;;   CARDIOVERSION     CARDIOVERSION N/A 06/21/2022   Procedure: CARDIOVERSION;  Surgeon: Pixie Casino, MD;  Location: [place];  Service: Cardiovascular;  Laterality: N/A;   CESAREAN SECTION     COLONOSCOPY     COLONOSCOPY N/A 11/22/2021   Procedure: COLONOSCOPY;  Surgeon: Daryel November, MD;  Location: [place];  Service: Gastroenterology;  Laterality: N/A;   ESOPHAGOGASTRODUODENOSCOPY (EGD) WITH PROPOFOL N/A 11/22/2021   Procedure: ESOPHAGOGASTRODUODENOSCOPY (EGD) WITH PROPOFOL;  Surgeon: Daryel November, MD;  Location: [place];  Service: Gastroenterology;  Laterality: N/A;   GIVENS CAPSULE STUDY N/A 11/22/2021   Procedure: GIVENS CAPSULE STUDY;  Surgeon: Daryel November, MD;  Location: [facility];  Service: Gastroenterology;  Laterality: N/A;   LOOP RECORDER INSERTION N/A 02/04/2018   Procedure: LOOP RECORDER INSERTION;  Surgeon: [name], MD;  Location: [place] [facility];  Service: Cardiovascular;  Laterality: N/A;   POLYPECTOMY  11/22/2021   Procedure: POLYPECTOMY;  Surgeon: Daryel November, MD;  Location: [facility];  Service: Gastroenterology;;   ROTATOR CUFF REPAIR Left     Current Medications: No outpatient medications have been marked as taking  for the 07/22/22 encounter (Appointment) with Vicent Febles, Yetta Barre, MD.     Allergies:   Demerol [meperidine hcl], Pneumococcal vaccines, Shingrix [zoster vac recomb adjuvanted], Other, and Prednisone   Social History   Socioeconomic History   Marital status: Married    Spouse name: Not on file   Number of children: Not on file   Years of  education: Not on file   Highest education level: Not on file  Occupational History   Not on file  Tobacco Use   Smoking status: Never   Smokeless tobacco: Never   Tobacco comments:    Never smoke 07/26/21  Vaping Use   Vaping Use: Never used  Substance and Sexual Activity   Alcohol use: Never   Drug use: Never   Sexual activity: Not on file  Other Topics Concern   Not on file  Social History Narrative   Lives in [place] with spouse   Retired from school system.   Social Determinants of Health   Financial Resource Strain: Not on file  Food Insecurity: No Food Insecurity (06/20/2022)   Hunger Vital Sign    Worried About Running Out of Food in the Last Year: Never true    Ran Out of Food in the Last Year: Never true  Transportation Needs: No Transportation Needs (06/20/2022)   PRAPARE - Hydrologist (Medical): No    Lack of Transportation (Non-Medical): No  Physical Activity: Not on file  Stress: Not on file  Social Connections: Not on file     Family History: The patient's family history includes Heart attack in her mother; Heart disease in her mother, sister, sister, sister, sister, and sister.  ROS:   Please see the history of present illness.    All other systems reviewed and are negative.  EKGs/Labs/Other Studies Reviewed Today:      EKG:  Last EKG results: today - sinus rhythm   Recent Labs: 05/13/2022: B Natriuretic Peptide 237.1 06/20/2022: Magnesium 2.3 06/21/2022: ALT 13; TSH 2.830 07/15/2022: BUN 10; Creatinine, Ser 1.03; Hemoglobin 9.9; Platelets 292; Potassium 4.1; Sodium 137     Physical Exam:    VS:  There were no vitals taken for this visit.    Wt Readings from Last 3 Encounters:  07/15/22 202 lb 13.2 oz (92 kg)  07/04/22 203 lb 3.2 oz (92.2 kg)  06/21/22 202 lb (91.6 kg)     GEN: Well nourished, well developed in no acute distress CARDIAC: RRR, no murmurs, rubs, gallops RESPIRATORY:  Normal work of  breathing MUSCULOSKELETAL: no edema    ASSESSMENT & PLAN:    Atrial fibrillation: s/p ablation x2 and Tikosyn with recurrences, symptomatic with with RVR. She expresses a strong preference for rhythm control. She did well for some time after her most recent ablation with repeat WACA and ablation of the posterior wall. I think it reasonable [name] ensure these lesion sets are still intact. If not, we will reinforce these lines. I did recommend she consider a convergent procedure. She is not interested.  She has had at least 1 recurrence despite being on amiodarone.  She has difficulty with RVR.  Unless there are significant targets for ablation, she will probably need pacemaker with AVJ ablation.          - Continue amiodarone          - increase diltiazem from 240 [name] 360 ILR in placed, at RRT. We can remove her loop recorder at the time of her ablation,  per her request. Secondary hypercoagulable state:  continue anticoagulation Anemia: she has not had any dark stools. She has been on supplementation in the past, but discontinued it fairly recently.       -Repeat CBC today       -Resume iron tablets        Medication Adjustments/Labs and Tests Ordered: Current medicines are reviewed at length with the patient today.  Concerns regarding medicines are outlined above.  No orders of the defined types were placed in this encounter.  No orders of the defined types were placed in this encounter.    Signed, Melida Quitter, MD  07/22/2022 9:42 AM    [place]

## 2022-07-31 ENCOUNTER — Other Ambulatory Visit: Payer: Self-pay

## 2022-07-31 ENCOUNTER — Emergency Department (HOSPITAL_COMMUNITY)
Admission: EM | Admit: 2022-07-31 | Discharge: 2022-08-01 | Disposition: A | Discharge status: Home / Self Care | Payer: Medicare Other | Attending: Emergency Medicine | Admitting: Emergency Medicine

## 2022-07-31 ENCOUNTER — Encounter (HOSPITAL_COMMUNITY): Payer: Self-pay

## 2022-07-31 DIAGNOSIS — J45909 Unspecified asthma, uncomplicated: Secondary | ICD-10-CM | POA: Insufficient documentation

## 2022-07-31 DIAGNOSIS — Z7951 Long term (current) use of inhaled steroids: Secondary | ICD-10-CM | POA: Diagnosis not present

## 2022-07-31 DIAGNOSIS — E860 Dehydration: Secondary | ICD-10-CM | POA: Diagnosis not present

## 2022-07-31 DIAGNOSIS — I4891 Unspecified atrial fibrillation: Secondary | ICD-10-CM | POA: Diagnosis not present

## 2022-07-31 DIAGNOSIS — R079 Chest pain, unspecified: Secondary | ICD-10-CM | POA: Diagnosis not present

## 2022-07-31 DIAGNOSIS — Z7901 Long term (current) use of anticoagulants: Secondary | ICD-10-CM | POA: Diagnosis not present

## 2022-07-31 DIAGNOSIS — I10 Essential (primary) hypertension: Secondary | ICD-10-CM | POA: Diagnosis not present

## 2022-07-31 DIAGNOSIS — I483 Typical atrial flutter: Secondary | ICD-10-CM | POA: Diagnosis not present

## 2022-07-31 DIAGNOSIS — I4892 Unspecified atrial flutter: Secondary | ICD-10-CM | POA: Diagnosis not present

## 2022-07-31 DIAGNOSIS — Z79899 Other long term (current) drug therapy: Secondary | ICD-10-CM | POA: Diagnosis not present

## 2022-07-31 DIAGNOSIS — R0789 Other chest pain: Secondary | ICD-10-CM | POA: Diagnosis not present

## 2022-07-31 DIAGNOSIS — R002 Palpitations: Secondary | ICD-10-CM | POA: Diagnosis not present

## 2022-07-31 DIAGNOSIS — R600 Localized edema: Secondary | ICD-10-CM | POA: Diagnosis not present

## 2022-07-31 LAB — CBC
HCT: 34.9 % — ABNORMAL LOW (ref 36.0–46.0)
Hemoglobin: 11.1 g/dL — ABNORMAL LOW (ref 12.0–15.0)
MCH: 29.8 pg (ref 26.0–34.0)
MCHC: 31.8 g/dL (ref 30.0–36.0)
MCV: 93.8 fL (ref 80.0–100.0)
Platelets: 232 10*3/uL (ref 150–400)
RBC: 3.72 MIL/uL — ABNORMAL LOW (ref 3.87–5.11)
RDW: 17.1 % — ABNORMAL HIGH (ref 11.5–15.5)
WBC: 4.7 10*3/uL (ref 4.0–10.5)
nRBC: 0 % (ref 0.0–0.2)

## 2022-07-31 LAB — BASIC METABOLIC PANEL
Anion gap: 16 — ABNORMAL HIGH (ref 5–15)
BUN: 11 mg/dL (ref 8–23)
CO2: 19 mmol/L — ABNORMAL LOW (ref 22–32)
Calcium: 9.8 mg/dL (ref 8.9–10.3)
Chloride: 102 mmol/L (ref 98–111)
Creatinine, Ser: 0.82 mg/dL (ref 0.44–1.00)
GFR, Estimated: >60 mL/min (ref >60)
Glucose, Bld: 137 mg/dL — ABNORMAL HIGH (ref 70–99)
Potassium: 3.8 mmol/L (ref 3.5–5.1)
Sodium: 137 mmol/L (ref 135–145)

## 2022-07-31 LAB — MAGNESIUM: Magnesium: 2.2 mg/dL (ref 1.7–2.4)

## 2022-07-31 MED ORDER — PROPOFOL 10 MG/ML IV BOLUS
0.5000 mg/kg | Freq: Once | INTRAVENOUS | Status: DC
  Filled 2022-07-31: qty 20

## 2022-07-31 MED ORDER — SODIUM CHLORIDE 0.9 % IV BOLUS: 500.0000 mL | Freq: Once | INTRAVENOUS | Status: DC

## 2022-07-31 MED ORDER — PROPOFOL 10 MG/ML IV BOLUS
INTRAVENOUS | Status: AC | PRN
  Administered 2022-07-31: 45.8 mg via INTRAVENOUS

## 2022-07-31 MED ORDER — SODIUM CHLORIDE 0.9 % IV SOLN
INTRAVENOUS | Status: AC | PRN
  Administered 2022-07-31: 500 mL via INTRAVENOUS

## 2022-07-31 NOTE — ED Provider Notes (Addendum)
[place] EMERGENCY DEPARTMENT AT [facility] Provider Note   CSN: [id number] Arrival date & time: 07/31/22  2045     History  Chief Complaint  Patient presents with   Chest Pain    Nicole [name] is a 72 y.o. adult.   Chest Pain  History of atrial fibrillation/flutter.  Recurrent episodes.  Cardioverted twice within the last month.  Had another episode today around 6:00 felt her heart rate start going fast.  Did have tightness in her chest which is typical for her.  Is on anticoagulation.  Is already on some antiarrhythmics.  I reviewed notes from electrophysiology with recent visit.  Last ate a few hours ago.   Past Medical History:  Diagnosis Date   Anticoagulant long-term use 11/16/2014   Anxiety 10/06/2017   Arthritis 10/06/2017   knees bilateral   Asthma 11/16/2014   Esophageal reflux 11/16/2014   Essential hypertension 08/15/2015   pt denies   H/O: GI bleed 12/11/2016   Hyperlipidemia 11/17/2014   Migraine headache 11/16/2014   Nausea 11/16/2014   Pain in limb 11/16/2014   Panic disorder 11/16/2014   Paroxysmal atrial fibrillation 11/17/2014   Swelling of joint 11/16/2014   Symptomatic anemia 11/24/2016   Tendonitis 11/16/2014   UTI (urinary tract infection) 12/19/2015    Home Medications Prior to Admission medications   Medication Sig Start Date End Date Taking? Authorizing Provider  acetaminophen (TYLENOL) 500 MG tablet Take 500 mg by mouth 2 (two) times daily as needed for mild pain, headache, moderate pain or fever.    [provider]  amiodarone (PACERONE) 200 MG tablet Take 2 tablets (400 mg total) by mouth 2 (two) times daily for 5 days, THEN 1 tablet (200 mg total) 2 (two) times daily for 14 days, THEN 1 tablet (200 mg total) daily. 06/22/22 05/31/23  Jonita Albee, PA-C  apixaban (ELIQUIS) 5 MG TABS tablet Take 1 tablet (5 mg total) by mouth 2 (two) times daily. 04/23/22   Camnitz, Will Daphine Deutscher, MD  Butalbital-APAP-Caffeine 50-300-40 MG CAPS Take 1  capsule by mouth 3 (three) times daily as needed (migraine). 04/17/22   [provider]  butorphanol (STADOL) 10 MG/ML nasal spray Place 1 spray into the nose every 4 (four) hours as needed for headache or migraine.     [provider]  cetirizine (ZYRTEC) 10 MG tablet Take 10 mg by mouth daily as needed for allergies.    [provider]  clonazePAM (KLONOPIN) 1 MG tablet Take 1 mg by mouth 3 (three) times daily.    [provider]  diltiazem (CARDIZEM CD) 360 MG 24 hr capsule Take 1 capsule (360 mg total) by mouth daily. 07/22/22   Mealor, Roberts Gaudy, MD  diltiazem (CARDIZEM) 30 MG tablet Take 1 Tablet Every 4 Hours As Needed For HR >100 07/22/22   Mealor, Roberts Gaudy, MD  ferrous sulfate 324 MG TBEC Take 1 tablet (324 mg total) by mouth daily. 07/22/22   Mealor, Roberts Gaudy, MD  fluticasone-salmeterol (ADVAIR HFA) 782-95 MCG/ACT inhaler Inhale 2 puffs into the lungs 2 (two) times daily as needed (asthma).    [provider]  furosemide (LASIX) 20 MG tablet TAKE 1 TABLET BY MOUTH EVERY DAY AS NEEDED FOR EDEMA Patient taking differently: Take 20 mg by mouth daily as needed for edema. 07/23/21   Camnitz, Will Daphine Deutscher, MD  KLOR-CON M20 20 MEQ tablet TAKE 1 TABLET BY MOUTH EVERY DAY Patient taking differently: Take 20 mEq by mouth at bedtime. 11/28/21  Camnitz, Will Daphine Deutscher, MD  pravastatin (PRAVACHOL) 40 MG tablet Take 40 mg by mouth at bedtime.  11/30/18   [provider]  promethazine (PHENERGAN) 25 MG tablet Take 25 mg by mouth every 6 (six) hours as needed for nausea or vomiting.    [provider]  sertraline (ZOLOFT) 50 MG tablet Take 50 mg by mouth at bedtime. 06/11/21   [provider]  VENTOLIN HFA 108 (90 Base) MCG/ACT inhaler Inhale 2 puffs into the lungs every 6 (six) hours as needed for wheezing or shortness of breath.    [provider]      Allergies    Demerol [meperidine hcl], Pneumococcal vaccines, Shingrix [zoster  vac recomb adjuvanted], Other, and Prednisone    Review of Systems   Review of Systems  Cardiovascular:  Positive for chest pain.    Physical Exam Updated Vital Signs BP 125/77   Pulse (!) 56   Temp 97.9 F (36.6 C) (Oral)   Resp (!) 22   Ht 5\' 8"  (1.727 m)   Wt 91.6 kg   SpO2 100%   BMI 30.71 kg/m  Physical Exam Vitals and nursing note reviewed.  Cardiovascular:     Rate and Rhythm: Tachycardia present. Rhythm irregular.  Pulmonary:     Breath sounds: No wheezing.  Chest:     Chest wall: No tenderness.  Abdominal:     Tenderness: There is no abdominal tenderness.  Musculoskeletal:     Cervical back: Normal range of motion.     Right lower leg: Edema present.     Left lower leg: Edema present.  Neurological:     Mental Status: She is alert.     ED Results / Procedures / Treatments   Labs (all labs ordered are listed, but only abnormal results are displayed) Labs Reviewed  CBC - Abnormal; Notable for the following components:      Result Value   RBC 3.72 (*)    Hemoglobin 11.1 (*)    HCT 34.9 (*)    RDW 17.1 (*)    All other components within normal limits  BASIC METABOLIC PANEL - Abnormal; Notable for the following components:   CO2 19 (*)    Glucose, Bld 137 (*)    Anion gap 16 (*)    All other components within normal limits  MAGNESIUM    EKG EKG Interpretation  Date/Time:  Wednesday July 31 2022 23:20:38 EDT Ventricular Rate:  58 PR Interval:  208 QRS Duration: 89 QT Interval:  420 QTC Calculation: 413 R Axis:   26 Text Interpretation: Sinus rhythm Confirmed by Benjiman Core [phone]) on 07/31/2022 11:38:17 PM  Radiology No results found.  Procedures .Cardioversion  Date/Time: 07/31/2022 11:00 PM  Performed by: Benjiman Core, MD Authorized by: Benjiman Core, MD   Consent:    Consent obtained:  Verbal   Consent given by:  Patient   Risks discussed:  Cutaneous burn, death and induced arrhythmia   Alternatives discussed:  No  treatment, anti-coagulation medication, delayed treatment, rate-control medication, alternative treatment and observation Pre-procedure details:    Cardioversion basis:  Elective   Rhythm:  Atrial flutter   Electrode placement:  Anterior-posterior Patient sedated: Yes. Refer to sedation procedure documentation for details of sedation.  Attempt one:    Cardioversion mode:  Synchronous   Waveform:  Biphasic   Shock (Joules):  200   Shock outcome:  Conversion to normal sinus rhythm Post-procedure details:    Patient status:  Awake   Patient tolerance of  procedure:  Tolerated well, no immediate complications .Sedation  Date/Time: 07/31/2022 10:00 PM  Performed by: Benjiman Core, MD Authorized by: Benjiman Core, MD   Consent:    Consent obtained:  Verbal   Consent given by:  Patient   Risks discussed:  Allergic reaction, dysrhythmia, inadequate sedation, nausea, respiratory compromise necessitating ventilatory assistance and intubation, prolonged hypoxia resulting in organ damage and vomiting   Alternatives discussed:  Analgesia without sedation Universal protocol:    Immediately prior to procedure, a time out was called: yes     Patient identity confirmed:  Arm band and verbally with patient Indications:    Procedure performed:  Cardioversion Pre-sedation assessment:    Time since last food or drink:  5   ASA classification: class 3 - patient with severe systemic disease     Mouth opening:  2 finger widths   Thyromental distance:  4 finger widths   Mallampati score:  II - soft palate, uvula, fauces visible   Neck mobility: normal     Pre-sedation assessments completed and reviewed: airway patency, cardiovascular function, hydration status, mental status, pain level, respiratory function and temperature     Pre-sedation assessments completed and reviewed: pre-procedure nausea and vomiting status not reviewed   Immediate pre-procedure details:    Reassessment: Patient  reassessed immediately prior to procedure     Reviewed: vital signs     Verified: bag valve mask available, emergency equipment available, intubation equipment available, IV patency confirmed, oxygen available and suction available   Procedure details (see MAR for exact dosages):    Preoxygenation:  Nasal cannula   Sedation:  Propofol   Intended level of sedation: deep   Intra-procedure monitoring:  Blood pressure monitoring, cardiac monitor, continuous pulse oximetry, frequent LOC assessments and frequent vital sign checks   Intra-procedure events: none     Total Provider sedation time (minutes):  10 Post-procedure details:    Post-sedation assessment completed:  07/31/2022 11:49 PM   Attendance: Constant attendance by certified staff until patient recovered     Recovery: Patient returned to pre-procedure baseline     Post-sedation assessments completed and reviewed: airway patency, cardiovascular function, hydration status, mental status, pain level, respiratory function and temperature     Post-sedation assessments completed and reviewed: post-procedure nausea and vomiting status not reviewed     Patient is stable for discharge or admission: yes     Procedure completion:  Tolerated well, no immediate complications     Medications Ordered in ED Medications  propofol (DIPRIVAN) 10 mg/mL bolus/IV push 45.8 mg (45.8 mg Intravenous Not Given 07/31/22 2335)  sodium chloride 0.9 % bolus 500 mL (500 mLs Intravenous Not Given 07/31/22 2334)  0.9 %  sodium chloride infusion (500 mLs Intravenous New Bag/Given 07/31/22 2302)  propofol (DIPRIVAN) 10 mg/mL bolus/IV push (45.8 mg Intravenous Given 07/31/22 2302)    ED Course/ Medical Decision Making/ A&P                             Medical Decision Making Amount and/or Complexity of Data Reviewed Labs: ordered.  Risk Prescription drug management.   Patient presented in A-fib with RVR.  History of same.  The fact has had 2 cardioversions  within the last month. Discussed with cardiology fellow.  Recommends repeat cardioversion.  Likely will need further outpatient management however. Blood work reassuring.  Bicarb mildly low.  Cardioversion done and went back to sinus rhythm.  Did appear to switch from  a A-fib which is likely atrial flutter prior to cardioversion.  Still tachycardic however.  Patient reevaluated after cardioversion and back at baseline.  Stable for discharge home.  CRITICAL CARE Performed by: Benjiman Core Total critical care time: 30 minutes Critical care time was exclusive of separately billable procedures and treating other patients. Critical care was necessary to treat or prevent imminent or life-threatening deterioration. Critical care was time spent personally by me on the following activities: development of treatment plan with patient and/or surrogate as well as nursing, discussions with consultants, evaluation of patient's response to treatment, examination of patient, obtaining history from patient or surrogate, ordering and performing treatments and interventions, ordering and review of laboratory studies, ordering and review of radiographic studies, pulse oximetry and re-evaluation of patient's condition.         Final Clinical Impression(s) / ED Diagnoses Final diagnoses:  Atrial fibrillation with rapid ventricular response  Atrial flutter with rapid ventricular response    Rx / DC Orders ED Discharge Orders          Ordered    Ambulatory referral to Cardiology       Comments: If you have not heard from the Cardiology office within the next 72 hours please call [phone].   07/31/22 2340              Benjiman Core, MD 07/31/22 2350    Benjiman Core, MD 09/25/22 1212

## 2022-07-31 NOTE — ED Triage Notes (Signed)
Pt BIBEMS w/ c/o chest pain which started approx 6pm to she to diltiazem with no relief. Pt endorse Hx of Afib.

## 2022-08-04 ENCOUNTER — Other Ambulatory Visit: Payer: Self-pay

## 2022-08-04 ENCOUNTER — Emergency Department (HOSPITAL_COMMUNITY)
Admission: EM | Admit: 2022-08-04 | Discharge: 2022-08-05 | Disposition: A | Discharge status: Home / Self Care | Payer: Medicare Other | Attending: Emergency Medicine | Admitting: Emergency Medicine

## 2022-08-04 ENCOUNTER — Emergency Department (HOSPITAL_COMMUNITY): Payer: Medicare Other

## 2022-08-04 DIAGNOSIS — I4891 Unspecified atrial fibrillation: Secondary | ICD-10-CM | POA: Insufficient documentation

## 2022-08-04 DIAGNOSIS — R600 Localized edema: Secondary | ICD-10-CM | POA: Diagnosis not present

## 2022-08-04 DIAGNOSIS — I499 Cardiac arrhythmia, unspecified: Secondary | ICD-10-CM | POA: Diagnosis not present

## 2022-08-04 DIAGNOSIS — R079 Chest pain, unspecified: Secondary | ICD-10-CM | POA: Diagnosis not present

## 2022-08-04 DIAGNOSIS — I10 Essential (primary) hypertension: Secondary | ICD-10-CM | POA: Insufficient documentation

## 2022-08-04 DIAGNOSIS — Z7901 Long term (current) use of anticoagulants: Secondary | ICD-10-CM | POA: Insufficient documentation

## 2022-08-04 DIAGNOSIS — R000 Tachycardia, unspecified: Secondary | ICD-10-CM | POA: Diagnosis not present

## 2022-08-04 LAB — CBC WITH DIFFERENTIAL/PLATELET
Abs Immature Granulocytes: 0.03 10*3/uL (ref 0.00–0.07)
Basophils Absolute: 0.1 10*3/uL (ref 0.0–0.1)
Basophils Relative: 1 %
Eosinophils Absolute: 0.2 10*3/uL (ref 0.0–0.5)
Eosinophils Relative: 3 %
HCT: 36.3 % (ref 36.0–46.0)
Hemoglobin: 11.4 g/dL — ABNORMAL LOW (ref 12.0–15.0)
Immature Granulocytes: 1 %
Lymphocytes Relative: 17 %
Lymphs Abs: 1.1 10*3/uL (ref 0.7–4.0)
MCH: 29.2 pg (ref 26.0–34.0)
MCHC: 31.4 g/dL (ref 30.0–36.0)
MCV: 93.1 fL (ref 80.0–100.0)
Monocytes Absolute: 0.8 10*3/uL (ref 0.1–1.0)
Monocytes Relative: 13 %
Neutro Abs: 4.1 10*3/uL (ref 1.7–7.7)
Neutrophils Relative %: 65 %
Platelets: 349 10*3/uL (ref 150–400)
RBC: 3.9 MIL/uL (ref 3.87–5.11)
RDW: 17.2 % — ABNORMAL HIGH (ref 11.5–15.5)
WBC: 6.2 10*3/uL (ref 4.0–10.5)
nRBC: 0 % (ref 0.0–0.2)

## 2022-08-04 LAB — TROPONIN I (HIGH SENSITIVITY): Troponin I (High Sensitivity): 6 ng/L (ref <18)

## 2022-08-04 LAB — BASIC METABOLIC PANEL
Anion gap: 11 (ref 5–15)
BUN: 15 mg/dL (ref 8–23)
CO2: 18 mmol/L — ABNORMAL LOW (ref 22–32)
Calcium: 9.4 mg/dL (ref 8.9–10.3)
Chloride: 104 mmol/L (ref 98–111)
Creatinine, Ser: 1.19 mg/dL — ABNORMAL HIGH (ref 0.44–1.00)
GFR, Estimated: 49 mL/min — ABNORMAL LOW (ref >60)
Glucose, Bld: 108 mg/dL — ABNORMAL HIGH (ref 70–99)
Potassium: 4 mmol/L (ref 3.5–5.1)
Sodium: 133 mmol/L — ABNORMAL LOW (ref 135–145)

## 2022-08-04 LAB — MAGNESIUM: Magnesium: 2.3 mg/dL (ref 1.7–2.4)

## 2022-08-04 MED ORDER — SODIUM CHLORIDE 0.9 % IV BOLUS
500.0000 mL | Freq: Once | INTRAVENOUS | Status: AC
  Administered 2022-08-04: 500 mL via INTRAVENOUS

## 2022-08-04 MED ORDER — PROPOFOL 10 MG/ML IV BOLUS
0.5000 mg/kg | Freq: Once | INTRAVENOUS | Status: DC
  Filled 2022-08-04: qty 20

## 2022-08-04 NOTE — ED Provider Notes (Incomplete)
[place] EMERGENCY DEPARTMENT AT [facility] Provider Note   CSN: [id number] Arrival date & time: 08/04/22  2200     History {Add pertinent medical, surgical, social history, OB history to HPI:1} Chief Complaint  Patient presents with   Chest Pain    Nicole [name] is a 72 y.o. adult.  HPI   Patient with medical history including A-fib, status post 2 ablations, currently on amiodarone, diltiazem, Tikosyn, hypertension, hyperlipidemia, currently on Eliquis, presented with complaints of atrial fibrillation.  States started earlier today around 1 PM, states she has chest pressure describes as an elephant sitting on her chest, she states it feels like her heart is racing, notes worsening shortness of breath, pleuritic chest pain, no near syncope, denies any worsening peripheral edema.  She denies any fever chills cough congestion, states that she has been compliant with all her medication including her Eliquis, she states that she did take her diltiazem, amiodarone earlier today, states that this happens to her frequently and she generally has to get cardioversions.  Reviewed patient's chart she has been seen twice over the last 4 weeks for atrial fibrillation, she was cardioverted on both consecutive times, most recent was 4 days ago, patient is scheduled to go obtain third cardioversion in May.  Home Medications Prior to Admission medications   Medication Sig Start Date End Date Taking? Authorizing Provider  acetaminophen (TYLENOL) 500 MG tablet Take 500 mg by mouth 2 (two) times daily as needed for mild pain, headache, moderate pain or fever.    [provider]  amiodarone (PACERONE) 200 MG tablet Take 2 tablets (400 mg total) by mouth 2 (two) times daily for 5 days, THEN 1 tablet (200 mg total) 2 (two) times daily for 14 days, THEN 1 tablet (200 mg total) daily. 06/22/22 05/31/23  Jonita Albee, PA-C  apixaban (ELIQUIS) 5 MG TABS tablet Take 1 tablet (5 mg total) by  mouth 2 (two) times daily. 04/23/22   Camnitz, Will Daphine Deutscher, MD  Butalbital-APAP-Caffeine 50-300-40 MG CAPS Take 1 capsule by mouth 3 (three) times daily as needed (migraine). 04/17/22   [provider]  butorphanol (STADOL) 10 MG/ML nasal spray Place 1 spray into the nose every 4 (four) hours as needed for headache or migraine.     [provider]  cetirizine (ZYRTEC) 10 MG tablet Take 10 mg by mouth daily as needed for allergies.    [provider]  clonazePAM (KLONOPIN) 1 MG tablet Take 1 mg by mouth 3 (three) times daily.    [provider]  diltiazem (CARDIZEM CD) 360 MG 24 hr capsule Take 1 capsule (360 mg total) by mouth daily. 07/22/22   Mealor, Roberts Gaudy, MD  diltiazem (CARDIZEM) 30 MG tablet Take 1 Tablet Every 4 Hours As Needed For HR >100 07/22/22   Mealor, Roberts Gaudy, MD  ferrous sulfate 324 MG TBEC Take 1 tablet (324 mg total) by mouth daily. 07/22/22   Mealor, Roberts Gaudy, MD  fluticasone-salmeterol (ADVAIR HFA) 657-84 MCG/ACT inhaler Inhale 2 puffs into the lungs 2 (two) times daily as needed (asthma).    [provider]  furosemide (LASIX) 20 MG tablet TAKE 1 TABLET BY MOUTH EVERY DAY AS NEEDED FOR EDEMA Patient taking differently: Take 20 mg by mouth daily as needed for edema. 07/23/21   Camnitz, Will Daphine Deutscher, MD  KLOR-CON M20 20 MEQ tablet TAKE 1 TABLET BY MOUTH EVERY DAY Patient taking differently: Take 20 mEq by mouth at bedtime. 11/28/21   Camnitz, Will  Daphine Deutscher, MD  pravastatin (PRAVACHOL) 40 MG tablet Take 40 mg by mouth at bedtime.  11/30/18   [provider]  promethazine (PHENERGAN) 25 MG tablet Take 25 mg by mouth every 6 (six) hours as needed for nausea or vomiting.    [provider]  sertraline (ZOLOFT) 50 MG tablet Take 50 mg by mouth at bedtime. 06/11/21   [provider]  VENTOLIN HFA 108 (90 Base) MCG/ACT inhaler Inhale 2 puffs into the lungs every 6 (six) hours as needed for wheezing or shortness of breath.     [provider]      Allergies    Demerol [meperidine hcl], Pneumococcal vaccines, Shingrix [zoster vac recomb adjuvanted], Other, and Prednisone    Review of Systems   Review of Systems  Constitutional:  Negative for chills and fever.  Respiratory:  Positive for chest tightness. Negative for shortness of breath.   Cardiovascular:  Positive for palpitations. Negative for chest pain.  Gastrointestinal:  Negative for abdominal pain.  Neurological:  Negative for headaches.    Physical Exam Updated Vital Signs BP (!) 120/54 (BP Location: Right Arm)   Pulse (!) 127   Temp 97.8 F (36.6 C) (Oral)   Resp 18   Ht 5\' 8"  (1.727 m)   Wt 91.6 kg   SpO2 100%   BMI 30.71 kg/m  Physical Exam Vitals and nursing note reviewed.  Constitutional:      General: She is not in acute distress.    Appearance: She is not ill-appearing.  HENT:     Head: Normocephalic and atraumatic.     Nose: No congestion.  Eyes:     Conjunctiva/sclera: Conjunctivae normal.  Cardiovascular:     Rate and Rhythm: Tachycardia present. Rhythm irregular.     Pulses: Normal pulses.     Heart sounds: No murmur heard.    No friction rub. No gallop.  Pulmonary:     Effort: No respiratory distress.     Breath sounds: No wheezing, rhonchi or rales.  Musculoskeletal:     Right lower leg: Edema present.     Left lower leg: Edema present.     Comments: Bilateral edema, nonpitting, no unilateral leg swelling no calf tenderness no palpable cords.  Skin:    General: Skin is warm and dry.  Neurological:     Mental Status: She is alert.  Psychiatric:        Mood and Affect: Mood normal.     ED Results / Procedures / Treatments   Labs (all labs ordered are listed, but only abnormal results are displayed) Labs Reviewed  BASIC METABOLIC PANEL  CBC WITH DIFFERENTIAL/PLATELET  MAGNESIUM  TROPONIN I (HIGH SENSITIVITY)    EKG None  Radiology No results found.  Procedures Procedures  {Document cardiac  monitor, telemetry assessment procedure when appropriate:1}  Medications Ordered in ED Medications  sodium chloride 0.9 % bolus 500 mL (has no administration in time range)    ED Course/ Medical Decision Making/ A&P   {   Click here for ABCD2, HEART and other calculatorsREFRESH Note before signing :1}                          Medical Decision Making Amount and/or Complexity of Data Reviewed Labs: ordered. Radiology: ordered.   This patient presents to the ED for concern of atrial fibrillation, this involves an extensive number of treatment options, and is a complaint that carries with it a high risk of  complications and morbidity.  The differential diagnosis includes electrolyte derailment, ACS, CHF, sepsis    Additional history obtained:  Additional history obtained from N/A External records from outside source obtained and reviewed including cardiology notes   Co morbidities that complicate the patient evaluation  Atrial ablation currently on Eliquis  Social Determinants of Health:  N/A    Lab Tests:  I Ordered, and personally interpreted labs.  The pertinent results include: CBC shows hemoglobin 11.4, BMP for sodium 133, CO2 of 18, glucose 108, creatinine 1.19, GFR 49, for troponin is 6, magnesium 2.3   Imaging Studies ordered:  I ordered imaging studies including ***  I independently visualized and interpreted imaging which showed *** I agree with the radiologist interpretation   Cardiac Monitoring:  The patient was maintained on a cardiac monitor.  I personally viewed and interpreted the cardiac monitored which showed an underlying rhythm of: EKG atrial fibrillation   Medicines ordered and prescription drug management:  I ordered medication including ***  for ***  I have reviewed the patients home medicines and have made adjustments as needed  Critical Interventions:  ***   Reevaluation:  Presents with A-fib, currently on Eliquis, on my exam she  is he medically stable, heart rate ranges between 130 and 140, will obtain basic lab workup imaging provide with some fluids and continue to monitor.    Consultations Obtained:  I requested consultation with the cardiologist Dr. Maryelizabeth Kaufmann,  and discussed lab and imaging findings as well as pertinent plan - they recommend: ***    Test Considered:  ***    Rule out ****    Dispostion and problem list  After consideration of the diagnostic results and the patients response to treatment, I feel that the patent would benefit from ***.       {Document critical care time when appropriate:1} {Document review of labs and clinical decision tools ie heart score, Chads2Vasc2 etc:1}  {Document your independent review of radiology images, and any outside records:1} {Document your discussion with family members, caretakers, and with consultants:1} {Document social determinants of health affecting pt's care:1} {Document your decision making why or why not admission, treatments were needed:1} Final Clinical Impression(s) / ED Diagnoses Final diagnoses:  None    Rx / DC Orders ED Discharge Orders     None

## 2022-08-04 NOTE — ED Triage Notes (Signed)
Pt arrived via [facility] with c/c of Chest Pain. Per EMS pt started having chest pain around 1300 4/14 and felty she was in A-Fib. Pts monitor read 153 HR. Pt stated she has been under stress due to husband having syncopal episode. Pt started taking amiodarone for few weeks, but since has had this happen twice. Pt is scheduled for ablation next month   100-150HR A-Fib, 160/100, 97% RA

## 2022-08-05 ENCOUNTER — Telehealth: Payer: Self-pay

## 2022-08-05 DIAGNOSIS — I4891 Unspecified atrial fibrillation: Secondary | ICD-10-CM | POA: Diagnosis not present

## 2022-08-05 MED ORDER — ETOMIDATE 2 MG/ML IV SOLN
16.0000 mg | Freq: Once | INTRAVENOUS | Status: AC
  Administered 2022-08-05: 12 mg via INTRAVENOUS
  Filled 2022-08-05: qty 10

## 2022-08-05 NOTE — Progress Notes (Unsigned)
  Electrophysiology Office Note:   Date:  08/06/2022  ID:  [name], DOB Apr 17, 1951, MRN [id number]  Primary Cardiologist: None Electrophysiologist: Maurice Small, MD   History of Present Illness:   Nicole [name] is a 72 y.o. adult with h/o PAF s/p ablation x 2 (most recent 02/2020),  seen today for acute visit due to recurrent AF.    Pt seen in ED again 4/10 and overnight again into 4/15 with cardioversion, which was her third this year.   Patient reports doing OK since cardioversion, just remains fatigue.  She was feeling normal up until Sunday afternoon. Her husband fell ill with diarrhea which culminated in a syncopal episode. [facility] was [facility] to [facility] hospital by EMS and remains there. Currently, she denies chest pain, dyspnea, PND, orthopnea, nausea, vomiting, dizziness, syncope, edema, weight gain, or early satiety.   Review of systems complete and found to be negative unless listed in HPI.   AAD history Failed tikosyn Currently on amiodarone ->   Studies Reviewed:    EKG is not ordered today.   Kardia mobile in office shows HR 69 bpm at NSR  Risk Assessment/Calculations:            Physical Exam:   VS:  BP 120/84   Pulse 70   Ht 5\' 8"  (1.727 m)   Wt 202 lb 6.4 oz (91.8 kg)   SpO2 96%   BMI 30.77 kg/m    Wt Readings from Last 3 Encounters:  08/06/22 202 lb 6.4 oz (91.8 kg)  08/04/22 202 lb (91.6 kg)  07/31/22 202 lb (91.6 kg)     GEN: Well nourished, well developed in no acute distress NECK: No JVD; No carotid bruits CARDIAC: Regular rate and rhythm, no murmurs, rubs, gallops RESPIRATORY:  Clear to auscultation without rales, wheezing or rhonchi  ABDOMEN: Soft, non-tender, non-distended EXTREMITIES:  No edema; No deformity   ASSESSMENT AND PLAN:    Atrial fibrillation: s/p ablation x2 and Tikosyn with recurrences, symptomatic with with RVR.  Has had continued intermittent occurrences on amiodarone She is scheduled for ablation with Dr. Nelly Laurence 5/15.   Increase amiodarone to 200 mg BID x 2 weeks, then 200 mg daily.  Continue diltiazem 360 mg daily  She has been recommended for convergent procedure but is not interested at this time.  Ultimately may require PPM with AV nodal ablation if their are no lesions to ablate with upcoming procedure Likely the stress of her husbands illness exacerbated this episode.   H/o ILR Plans to remove loop recorder at time of ablation at her request.   Anemia No dark stools Follow labs.   HTN Stable on current regimen   Follow up with Dr. Nelly Laurence as usual post procedure  Signed, Graciella Freer, PA-C

## 2022-08-05 NOTE — ED Notes (Signed)
RRT at bedside for the synchronize cardioversion due to irregular a-fib rhythm. Patient was shocked at 120J. No hemodynamic instability noted or respiratory compromise noted. Patient tolerated the procedure well. MD and PA at bedside throughout.

## 2022-08-05 NOTE — Telephone Encounter (Signed)
     Patient  visit on 4/11  at [facility]  Have you been able to follow up with your primary care physician? Yes   The patient was or was not able to obtain any needed medicine or equipment. Yes   Are there diet recommendations that you are having difficulty following? Na   Patient expresses understanding of discharge instructions and education provided has no other needs at this time.  Yes      Lenard Forth [facility] Guide, [facility] [phone] [address] [place], [place], [place] [address] Phone: [phone] Email: [email or web address]

## 2022-08-05 NOTE — ED Notes (Signed)
Cardioversion at 120J

## 2022-08-05 NOTE — ED Provider Notes (Signed)
I provided a substantive portion of the care of this patient. I personally provided more than half of the total time dedicated to treatment of this patient. I personally made/approved the management plan for this patient and take responsibility for the patient management.   Briefly, the patient is a 72 y.o. adult with h/o paroxysmal A-fib, here for chest pain related to A-fib RVR.  Patient is compliant with her medications including blood thinner.  Scheduled for ablation in May.  Question  Vitals:   08/05/22 0035 08/05/22 0040  BP: 114/76 123/80  Pulse: 65 65  Resp: 14 19  Temp:    SpO2: 100% 100%    CONSTITUTIONAL:  well-appearing, NAD NEURO:  Alert and oriented x 3, no focal deficits EYES:  pupils equal and reactive ENT/NECK:  trachea midline, no JVD CARDIO:  tachy rate, irregularly irregular rhythm, well-perfused PULM: None labored breathing GI/GU:  Abdomen non-distended MSK/SPINE:  No gross deformities, no edema SKIN:  no rash, atraumatic PSYCH:  Appropriate speech and behavior   EKG Interpretation  Date/Time:  Sunday August 04 2022 22:08:24 EDT Ventricular Rate:  146 PR Interval:    QRS Duration: 94 QT Interval:  294 QTC Calculation: 454 R Axis:   99 Text Interpretation: Atrial fibrillation Right axis deviation ST depression, probably rate related Confirmed by Drema Pry [phone]) on 08/04/2022 11:01:56 PM        No significant electrolyte derangements. Case was discussed with cardiology regarding admission for possible ablation versus medication changes versus cardioversion in the emergency department.  Cardiology recommended ER cardioversion.  Will work on arrangement for possibly moving up her appointments with EP.   Cardioverted at bedside under sedation. She remained HDS and appropriate for DC.  .Sedation  Date/Time: 08/05/2022 12:54 AM  Performed by: Nira Conn, MD Authorized by: Nira Conn, MD   Consent:    Consent obtained:   Written   Consent given by:  Patient Universal protocol:    Procedure explained and questions answered to patient or proxy's satisfaction: yes     Immediately prior to procedure, a time out was called: yes   Pre-sedation assessment:    Time since last food or drink:  2000   ASA classification: class 3 - patient with severe systemic disease     Mouth opening:  2 finger widths   Thyromental distance:  3 finger widths   Mallampati score:  II - soft palate, uvula, fauces visible   Pre-sedation assessments completed and reviewed: airway patency, cardiovascular function, mental status, nausea/vomiting, pain level, respiratory function and temperature     Pre-sedation assessment completed:  08/05/2022 12:10 AM Immediate pre-procedure details:    Reassessment: Patient reassessed immediately prior to procedure     Reviewed: vital signs and relevant labs/tests     Verified: bag valve mask available, emergency equipment available, intubation equipment available, IV patency confirmed and oxygen available   Procedure details (see MAR for exact dosages):    Preoxygenation:  Nasal cannula   Sedation:  Etomidate   Intended level of sedation: deep   Intra-procedure monitoring:  Blood pressure monitoring, continuous capnometry, frequent LOC assessments, frequent vital sign checks, continuous pulse oximetry and cardiac monitor   Intra-procedure events: none     Total Provider sedation time (minutes):  10 Post-procedure details:    Post-sedation assessment completed:  08/05/2022 12:57 AM   Attendance: Constant attendance by certified staff until patient recovered     Recovery: Patient returned to pre-procedure baseline     Post-sedation assessments  completed and reviewed: airway patency, cardiovascular function, hydration status, mental status, nausea/vomiting, pain level and respiratory function     Patient is stable for discharge or admission: yes     Procedure completion:  Tolerated well, no immediate  complications       Nicole [name], Nicole Garnet, MD 08/05/22 [phone]

## 2022-08-05 NOTE — Discharge Instructions (Signed)
Please continue with all your home medications.  I have sent a message to your cardiologist, would also like you to contact your cardiologist as I feel you will benefit from a cardiac ablation earlier than currently scheduled.  Come back to the emergency department if you develop chest pain, shortness of breath, severe abdominal pain, uncontrolled nausea, vomiting, diarrhea.

## 2022-08-06 ENCOUNTER — Encounter: Payer: Self-pay | Admitting: *Deleted

## 2022-08-06 ENCOUNTER — Ambulatory Visit: Payer: Medicare Other | Attending: Student | Admitting: Student

## 2022-08-06 ENCOUNTER — Encounter: Payer: Self-pay | Admitting: Student

## 2022-08-06 VITALS — BP 120/84 | HR 70 | Ht 68.0 in | Wt 202.4 lb

## 2022-08-06 DIAGNOSIS — D6869 Other thrombophilia: Secondary | ICD-10-CM

## 2022-08-06 DIAGNOSIS — I4819 Other persistent atrial fibrillation: Secondary | ICD-10-CM

## 2022-08-06 DIAGNOSIS — Z79899 Other long term (current) drug therapy: Secondary | ICD-10-CM | POA: Diagnosis not present

## 2022-08-06 DIAGNOSIS — I480 Paroxysmal atrial fibrillation: Secondary | ICD-10-CM | POA: Diagnosis not present

## 2022-08-06 DIAGNOSIS — I10 Essential (primary) hypertension: Secondary | ICD-10-CM

## 2022-08-06 NOTE — Patient Instructions (Signed)
Medication Instructions:  1.Take amiodarone 200 mg twice a day for 2 weeks, then decrease to 200 mg daily *If you need a refill on your cardiac medications before your next appointment, please call your pharmacy*  Lab Work: BMET, CBC--TODAY If you have labs (blood work) drawn today and your tests are completely normal, you will receive your results only by: MyChart Message (if you have MyChart) OR A paper copy in the mail If you have any lab test that is abnormal or we need to change your treatment, we will call you to review the results.  Follow-Up: At [facility], you and your health needs are our priority.  As part of our continuing mission to provide you with exceptional heart care, we have created designated Provider Care Teams.  These Care Teams include your primary Cardiologist (physician) and Advanced Practice Providers (APPs -  Physician Assistants and Nurse Practitioners) who all work together to provide you with the care you need, when you need it.  Your next appointment:   Keep appointments as scheduled

## 2022-08-07 ENCOUNTER — Telehealth: Payer: Self-pay

## 2022-08-07 ENCOUNTER — Other Ambulatory Visit (HOSPITAL_COMMUNITY): Payer: Self-pay

## 2022-08-07 DIAGNOSIS — I4819 Other persistent atrial fibrillation: Secondary | ICD-10-CM

## 2022-08-07 LAB — CBC
Hematocrit: 35.2 % (ref 34.0–46.6)
Hemoglobin: 11.4 g/dL (ref 11.1–15.9)
MCH: 29.5 pg (ref 26.6–33.0)
MCHC: 32.4 g/dL (ref 31.5–35.7)
MCV: 91 fL (ref 79–97)
Platelets: 335 10*3/uL (ref 150–450)
RBC: 3.86 x10E6/uL (ref 3.77–5.28)
RDW: 15.3 % (ref 11.7–15.4)
WBC: 4.3 10*3/uL (ref 3.4–10.8)

## 2022-08-07 LAB — BASIC METABOLIC PANEL
BUN/Creatinine Ratio: 8 — ABNORMAL LOW (ref 12–28)
BUN: 8 mg/dL (ref 8–27)
CO2: 20 mmol/L (ref 20–29)
Calcium: 9.9 mg/dL (ref 8.7–10.3)
Chloride: 104 mmol/L (ref 96–106)
Creatinine, Ser: 1.05 mg/dL — ABNORMAL HIGH (ref 0.57–1.00)
Glucose: 96 mg/dL (ref 70–99)
Potassium: 5.2 mmol/L (ref 3.5–5.2)
Sodium: 140 mmol/L (ref 134–144)
eGFR: 57 mL/min/{1.73_m2} — ABNORMAL LOW (ref >59)

## 2022-08-07 MED ORDER — AMIODARONE HCL 200 MG PO TABS: 200.0000 mg | ORAL_TABLET | Freq: Two times a day (BID) | ORAL | 1 refills | Status: DC

## 2022-08-07 MED ORDER — DILTIAZEM HCL 30 MG PO TABS
ORAL_TABLET | ORAL | 1 refills | Status: DC
Start: 2022-08-07 — End: 2022-10-03

## 2022-08-07 MED ORDER — POTASSIUM CHLORIDE CRYS ER 20 MEQ PO TBCR: 20.0000 meq | EXTENDED_RELEASE_TABLET | Freq: Every day | ORAL | 3 refills | Status: DC | PRN

## 2022-08-07 NOTE — Telephone Encounter (Signed)
Patient aware of results. Updated medication list. 

## 2022-08-07 NOTE — Telephone Encounter (Signed)
-----   Message from Graciella Freer, PA-C sent at 08/07/2022  9:14 AM EDT ----- Would stop daily potassium and have her only take when she takes lasix.

## 2022-08-08 ENCOUNTER — Telehealth: Payer: Self-pay

## 2022-08-08 NOTE — Telephone Encounter (Signed)
     Patient  visit on 4/15  at [facility]  Have you been able to follow up with your primary care physician? Yes   The patient was or was not able to obtain any needed medicine or equipment. Yes   Are there diet recommendations that you are having difficulty following? Na   Patient expresses understanding of discharge instructions and education provided has no other needs at this time.  Yes     ED Follow Up call

## 2022-08-09 ENCOUNTER — Telehealth: Payer: Self-pay | Admitting: Cardiovascular Disease

## 2022-08-09 NOTE — Telephone Encounter (Signed)
Patient needed to clarify instruction given to her yesterday. Informed patient that Otilio Saber PA wanted her to stop taking potassium every day and only take it if she is taking her PRN lasix. Patient verbalized understanding.

## 2022-08-09 NOTE — Telephone Encounter (Signed)
Patient is requesting to speak with RN Pam about some medications that they had spoken about previously. Please advise.

## 2022-08-17 ENCOUNTER — Other Ambulatory Visit: Payer: Self-pay

## 2022-08-17 ENCOUNTER — Observation Stay (HOSPITAL_COMMUNITY)
Admission: EM | Admit: 2022-08-17 | Discharge: 2022-08-18 | Disposition: A | Discharge status: Home / Self Care | Payer: Medicare Other | Attending: Cardiology | Admitting: Cardiology

## 2022-08-17 ENCOUNTER — Encounter (HOSPITAL_COMMUNITY): Payer: Self-pay | Admitting: Cardiology

## 2022-08-17 DIAGNOSIS — I480 Paroxysmal atrial fibrillation: Secondary | ICD-10-CM | POA: Diagnosis not present

## 2022-08-17 DIAGNOSIS — I10 Essential (primary) hypertension: Secondary | ICD-10-CM | POA: Diagnosis not present

## 2022-08-17 DIAGNOSIS — Z7901 Long term (current) use of anticoagulants: Secondary | ICD-10-CM | POA: Diagnosis not present

## 2022-08-17 DIAGNOSIS — Z79899 Other long term (current) drug therapy: Secondary | ICD-10-CM | POA: Diagnosis not present

## 2022-08-17 DIAGNOSIS — R000 Tachycardia, unspecified: Secondary | ICD-10-CM | POA: Diagnosis not present

## 2022-08-17 DIAGNOSIS — J45909 Unspecified asthma, uncomplicated: Secondary | ICD-10-CM | POA: Diagnosis not present

## 2022-08-17 DIAGNOSIS — I4891 Unspecified atrial fibrillation: Principal | ICD-10-CM | POA: Diagnosis present

## 2022-08-17 DIAGNOSIS — R002 Palpitations: Secondary | ICD-10-CM | POA: Diagnosis present

## 2022-08-17 LAB — BASIC METABOLIC PANEL
Anion gap: 11 (ref 5–15)
BUN: 9 mg/dL (ref 8–23)
CO2: 22 mmol/L (ref 22–32)
Calcium: 9.6 mg/dL (ref 8.9–10.3)
Chloride: 102 mmol/L (ref 98–111)
Creatinine, Ser: 1.06 mg/dL — ABNORMAL HIGH (ref 0.44–1.00)
GFR, Estimated: 56 mL/min — ABNORMAL LOW (ref >60)
Glucose, Bld: 111 mg/dL — ABNORMAL HIGH (ref 70–99)
Potassium: 4.2 mmol/L (ref 3.5–5.1)
Sodium: 135 mmol/L (ref 135–145)

## 2022-08-17 LAB — CBC
HCT: 38.5 % (ref 36.0–46.0)
Hemoglobin: 12.2 g/dL (ref 12.0–15.0)
MCH: 29.5 pg (ref 26.0–34.0)
MCHC: 31.7 g/dL (ref 30.0–36.0)
MCV: 93 fL (ref 80.0–100.0)
Platelets: 275 10*3/uL (ref 150–400)
RBC: 4.14 MIL/uL (ref 3.87–5.11)
RDW: 16.4 % — ABNORMAL HIGH (ref 11.5–15.5)
WBC: 3.2 10*3/uL — ABNORMAL LOW (ref 4.0–10.5)
nRBC: 0 % (ref 0.0–0.2)

## 2022-08-17 LAB — MAGNESIUM: Magnesium: 2 mg/dL (ref 1.7–2.4)

## 2022-08-17 LAB — PROTIME-INR
INR: 1.3 — ABNORMAL HIGH (ref 0.8–1.2)
Prothrombin Time: 16.1 seconds — ABNORMAL HIGH (ref 11.4–15.2)

## 2022-08-17 MED ORDER — SERTRALINE HCL 50 MG PO TABS
50.0000 mg | ORAL_TABLET | Freq: Every day | ORAL | Status: DC
  Administered 2022-08-17: 50 mg via ORAL
  Filled 2022-08-17: qty 1

## 2022-08-17 MED ORDER — ONDANSETRON HCL 4 MG/2ML IJ SOLN: 4.0000 mg | Freq: Four times a day (QID) | INTRAMUSCULAR | Status: DC | PRN

## 2022-08-17 MED ORDER — BUTORPHANOL TARTRATE 10 MG/ML NA SOLN: 1.0000 | NASAL | Status: DC | PRN

## 2022-08-17 MED ORDER — AMIODARONE LOAD VIA INFUSION
150.0000 mg | Freq: Once | INTRAVENOUS | Status: AC
  Administered 2022-08-17: 150 mg via INTRAVENOUS
  Filled 2022-08-17: qty 83.34

## 2022-08-17 MED ORDER — AMIODARONE HCL IN DEXTROSE 360-4.14 MG/200ML-% IV SOLN
30.0000 mg/h | INTRAVENOUS | Status: DC
  Administered 2022-08-17 – 2022-08-18 (×2): 30 mg/h via INTRAVENOUS
  Filled 2022-08-17 (×2): qty 200

## 2022-08-17 MED ORDER — ACETAMINOPHEN 500 MG PO TABS: 500.0000 mg | ORAL_TABLET | Freq: Two times a day (BID) | ORAL | Status: DC | PRN

## 2022-08-17 MED ORDER — AMIODARONE HCL IN DEXTROSE 360-4.14 MG/200ML-% IV SOLN
60.0000 mg/h | INTRAVENOUS | Status: DC
  Administered 2022-08-17 (×2): 60 mg/h via INTRAVENOUS
  Filled 2022-08-17 (×2): qty 200

## 2022-08-17 MED ORDER — DILTIAZEM HCL ER COATED BEADS 180 MG PO CP24
360.0000 mg | ORAL_CAPSULE | Freq: Every day | ORAL | Status: DC
  Administered 2022-08-17 – 2022-08-18 (×2): 360 mg via ORAL
  Filled 2022-08-17 (×2): qty 2

## 2022-08-17 MED ORDER — ETOMIDATE 2 MG/ML IV SOLN: 0.1000 mg/kg | Freq: Once | INTRAVENOUS | Status: DC

## 2022-08-17 MED ORDER — APIXABAN 5 MG PO TABS: 5.0000 mg | ORAL_TABLET | Freq: Two times a day (BID) | ORAL | Status: DC

## 2022-08-17 MED ORDER — FERROUS SULFATE 325 (65 FE) MG PO TABS
324.0000 mg | ORAL_TABLET | Freq: Every day | ORAL | Status: DC
  Administered 2022-08-17 – 2022-08-18 (×2): 324 mg via ORAL
  Filled 2022-08-17 (×2): qty 1

## 2022-08-17 MED ORDER — ONDANSETRON HCL 4 MG/2ML IJ SOLN: 4.0000 mg | Freq: Once | INTRAMUSCULAR | Status: DC

## 2022-08-17 MED ORDER — CLONAZEPAM 0.5 MG PO TABS
1.0000 mg | ORAL_TABLET | Freq: Three times a day (TID) | ORAL | Status: DC
  Administered 2022-08-17 – 2022-08-18 (×3): 1 mg via ORAL
  Filled 2022-08-17 (×3): qty 2

## 2022-08-17 MED ORDER — METOPROLOL TARTRATE 5 MG/5ML IV SOLN
5.0000 mg | Freq: Once | INTRAVENOUS | Status: AC
  Administered 2022-08-17: 5 mg via INTRAVENOUS
  Filled 2022-08-17: qty 5

## 2022-08-17 MED ORDER — SODIUM CHLORIDE 0.9 % IV SOLN: INTRAVENOUS | Status: DC

## 2022-08-17 MED ORDER — PRAVASTATIN SODIUM 40 MG PO TABS
40.0000 mg | ORAL_TABLET | Freq: Every day | ORAL | Status: DC
  Administered 2022-08-17: 40 mg via ORAL
  Filled 2022-08-17: qty 1

## 2022-08-17 MED ORDER — ACETAMINOPHEN 325 MG PO TABS
650.0000 mg | ORAL_TABLET | ORAL | Status: DC | PRN
  Administered 2022-08-17: 650 mg via ORAL
  Filled 2022-08-17: qty 2

## 2022-08-17 MED ORDER — APIXABAN 5 MG PO TABS
5.0000 mg | ORAL_TABLET | Freq: Two times a day (BID) | ORAL | Status: DC
  Administered 2022-08-18: 5 mg via ORAL
  Filled 2022-08-17: qty 1

## 2022-08-17 MED ORDER — POTASSIUM CHLORIDE CRYS ER 20 MEQ PO TBCR: 20.0000 meq | EXTENDED_RELEASE_TABLET | Freq: Every day | ORAL | Status: DC | PRN

## 2022-08-17 NOTE — ED Provider Notes (Signed)
[place] EMERGENCY DEPARTMENT AT [facility] Provider Note   CSN: [id number] Arrival date & time: 08/17/22  4098     History  No chief complaint on file.   Nicole [name] is a 72 y.o. adult.  HPI Patient presents for palpitations.  Medical history includes anxiety, asthma, HTN, HLD, GI bleed, panic disorder, atrial fibrillation, anemia.  Home medications include amiodarone, Eliquis, Cardizem, Klonopin, Lasix.  She is followed by [facility].  She was seen in the ED 2 weeks ago for atrial fibrillation.  She underwent cardioversion at that time.  Last cardiology office visit was 11 days ago.  At the time, she was told to increase her amiodarone dose to twice daily for 10 days.  She recently went back to the 1 a day dosing.  She has been adherent to her home medications, including her Eliquis.  She is prescribed Lasix as needed and has not taken it recently.  She has an ablation scheduled in 3 weeks.  This morning, at approximately 5 AM, she awoke with palpitations.  She has been experiencing this since that time.  She took her morning medications, including an extra dose of her Cardizem.  Other than the palpitations and fatigue, patient denies any new symptoms.    Home Medications Prior to Admission medications   Medication Sig Start Date End Date Taking? Authorizing Provider  acetaminophen (TYLENOL) 500 MG tablet Take 500 mg by mouth 2 (two) times daily as needed for mild pain, headache, moderate pain or fever.    [provider]  amiodarone (PACERONE) 200 MG tablet Take 1 tablet (200 mg total) by mouth 2 (two) times daily. 08/07/22   Fenton, Clint R, PA  apixaban (ELIQUIS) 5 MG TABS tablet Take 1 tablet (5 mg total) by mouth 2 (two) times daily. 04/23/22   Camnitz, Will Daphine Deutscher, MD  Butalbital-APAP-Caffeine 50-300-40 MG CAPS Take 1 capsule by mouth 3 (three) times daily as needed (migraine). 04/17/22   [provider]  butorphanol (STADOL) 10 MG/ML nasal spray Place 1  spray into the nose every 4 (four) hours as needed for headache or migraine.     [provider]  cetirizine (ZYRTEC) 10 MG tablet Take 10 mg by mouth daily as needed for allergies.    [provider]  clonazePAM (KLONOPIN) 1 MG tablet Take 1 mg by mouth 3 (three) times daily.    [provider]  diltiazem (CARDIZEM CD) 360 MG 24 hr capsule Take 1 capsule (360 mg total) by mouth daily. 07/22/22   Mealor, Roberts Gaudy, MD  diltiazem (CARDIZEM) 30 MG tablet Take 1 Tablet Every 4 Hours As Needed For HR >100 08/07/22   Fenton, Clint R, PA  ferrous sulfate 324 MG TBEC Take 1 tablet (324 mg total) by mouth daily. 07/22/22   Mealor, Roberts Gaudy, MD  fluticasone-salmeterol (ADVAIR HFA) 119-14 MCG/ACT inhaler Inhale 2 puffs into the lungs 2 (two) times daily as needed (asthma).    [provider]  furosemide (LASIX) 20 MG tablet TAKE 1 TABLET BY MOUTH EVERY DAY AS NEEDED FOR EDEMA Patient taking differently: Take 20 mg by mouth daily as needed for edema. 07/23/21   Camnitz, Andree Coss, MD  potassium chloride SA (KLOR-CON M20) 20 MEQ tablet Take 1 tablet (20 mEq total) by mouth daily as needed (take with your furosemide as needed). 08/07/22   Graciella Freer, PA-C  pravastatin (PRAVACHOL) 40 MG tablet Take 40 mg by mouth at bedtime.  11/30/18   [provider]  promethazine (PHENERGAN) 25 MG tablet Take 25 mg by mouth every 6 (six) hours as needed for nausea or vomiting.    [provider]  sertraline (ZOLOFT) 50 MG tablet Take 50 mg by mouth at bedtime. 06/11/21   [provider]  VENTOLIN HFA 108 (90 Base) MCG/ACT inhaler Inhale 2 puffs into the lungs every 6 (six) hours as needed for wheezing or shortness of breath.    [provider]      Allergies    Demerol [meperidine hcl], Pneumococcal vaccines, Shingrix [zoster vac recomb adjuvanted], Other, and Prednisone    Review of Systems   Review of Systems  Constitutional:  Positive for  fatigue.  Cardiovascular:  Positive for palpitations.  All other systems reviewed and are negative.   Physical Exam Updated Vital Signs BP (!) 127/110   Pulse (!) 118   Temp 98.1 F (36.7 C) (Oral)   Resp 18   Ht 5\' 8"  (1.727 m)   Wt 91.2 kg   SpO2 100%   BMI 30.56 kg/m  Physical Exam Vitals and nursing note reviewed.  Constitutional:      General: She is not in acute distress.    Appearance: Normal appearance. She is well-developed. She is not ill-appearing, toxic-appearing or diaphoretic.  HENT:     Head: Normocephalic and atraumatic.     Right Ear: External ear normal.     Left Ear: External ear normal.     Nose: Nose normal.     Mouth/Throat:     Mouth: Mucous membranes are moist.  Eyes:     Extraocular Movements: Extraocular movements intact.     Conjunctiva/sclera: Conjunctivae normal.  Cardiovascular:     Rate and Rhythm: Tachycardia present. Rhythm irregular.     Heart sounds: No murmur heard. Pulmonary:     Effort: Pulmonary effort is normal. No respiratory distress.  Abdominal:     General: There is no distension.     Palpations: Abdomen is soft.     Tenderness: There is no abdominal tenderness.  Musculoskeletal:        General: No swelling.     Cervical back: Normal range of motion and neck supple.     Right lower leg: Edema present.     Left lower leg: Edema present.  Skin:    General: Skin is warm and dry.  Neurological:     General: No focal deficit present.     Mental Status: She is alert and oriented to person, place, and time.  Psychiatric:        Mood and Affect: Mood normal.        Behavior: Behavior normal.     ED Results / Procedures / Treatments   Labs (all labs ordered are listed, but only abnormal results are displayed) Labs Reviewed  CBC - Abnormal; Notable for the following components:      Result Value   WBC 3.2 (*)    RDW 16.4 (*)    All other components within normal limits  PROTIME-INR - Abnormal; Notable for the  following components:   Prothrombin Time 16.1 (*)    INR 1.3 (*)    All other components within normal limits  MAGNESIUM  BASIC METABOLIC PANEL    EKG None  Radiology No results found.  Procedures Procedures    Medications Ordered in ED Medications  0.9 %  sodium chloride infusion ( Intravenous New Bag/Given 08/17/22 1042)  amiodarone (NEXTERONE) 1.8 mg/mL load via infusion 150 mg (150 mg Intravenous Bolus  from Bag 08/17/22 1044)    Followed by  amiodarone (NEXTERONE PREMIX) 360-4.14 MG/200ML-% (1.8 mg/mL) IV infusion (60 mg/hr Intravenous New Bag/Given 08/17/22 1044)    Followed by  amiodarone (NEXTERONE PREMIX) 360-4.14 MG/200ML-% (1.8 mg/mL) IV infusion (has no administration in time range)  metoprolol tartrate (LOPRESSOR) injection 5 mg (has no administration in time range)    ED Course/ Medical Decision Making/ A&P                             Medical Decision Making Amount and/or Complexity of Data Reviewed Labs: ordered.  Risk Prescription drug management.   This patient presents to the ED for concern of palpitations, this involves an extensive number of treatment options, and is a complaint that carries with it a high risk of complications and morbidity.  The differential diagnosis includes atrial fibrillation with RVR, other arrhythmia, electrolyte abnormalities   Co morbidities that complicate the patient evaluation  anxiety, asthma, HTN, HLD, GI bleed, panic disorder, atrial fibrillation, anemia   Additional history obtained:  Additional history obtained from N/A External records from outside source obtained and reviewed including EMR   Lab Tests:  I Ordered, and personally interpreted labs.  The pertinent results include: Normal hemoglobin, mild leukopenia, remaining lab work pending at time of admission  Cardiac Monitoring: / EKG:  The patient was maintained on a cardiac monitor.  I personally viewed and interpreted the cardiac monitored which  showed an underlying rhythm of: Atrial fibrillation with RVR   Consultations Obtained:  I requested consultation with the cardiologist, Dr. Wyline Mood,  and discussed lab and imaging findings as well as pertinent plan - they recommend: Amiodarone gtt. and admission to cardiology   Problem List / ED Course / Critical interventions / Medication management  Patient presents for palpitations and fatigue secondary to atrial fibrillation with RVR.  She has a history of recurrent episodes and has undergone 6 cardioversions this year, most recently of which was 2 weeks ago.  Patient reports that she knows when she is in A-fib and has been in this rhythm since at least 5 AM this morning, when she woke up with palpitations.  On arrival in the ED, heart rate is in the range of 130.  Despite this, she is well-appearing on exam.  She denies any shortness of breath or chest pain.  Her breathing is unlabored.  Other than heart rate, other vital signs are normal.  EKG shows irregularly irregular rhythm.  Laboratory workup was initiated.  Cardiology was consulted.  I spoke with cardiologist, Dr. Wyline Mood, who advises against ED cardioversion.  Patient will be treated medically with amiodarone gtt. and admitted to cardiology service.  Heart rate at this time remains in the 110s.  Patient was admitted for further management.   Social Determinants of Health:  Has access to outpatient care        Final Clinical Impression(s) / ED Diagnoses Final diagnoses:  Atrial fibrillation with RVR [facility])    Rx / DC Orders ED Discharge Orders     None         Gloris Manchester, MD 08/17/22 1124

## 2022-08-17 NOTE — ED Notes (Signed)
ED TO INPATIENT HANDOFF REPORT  ED Nurse Name and Phone #: [phone]  S Name/Age/Gender Nicole [name] 72 y.o. adult Room/Bed: 019C/019C  Code Status   Code Status: Full Code  Home/SNF/Other Home Patient oriented to: self, place, time, and situation Is this baseline? Yes   Triage Complete: Triage complete  Chief Complaint Atrial fibrillation with RVR (HCC) [I48.91]  Triage Note Pt BIBEMS from home for chronic transient afib. Started 2- 3 hrs ago. Took Cardizem 30 mg x 2 doses,hasn't had any relief. Hx of cardioversion on the 14th of April, denies any missed doses of eliquis.  130-140 hr 142/90 98% RA  Scheduled ablasion next month.   Allergies Allergies  Allergen Reactions   Demerol [Meperidine Hcl] Nausea And Vomiting and Other (See Comments)    Triggers migraine.   Pneumococcal Vaccines Other (See Comments)    Triggered A-Fib   Shingrix [Zoster Vac Recomb Adjuvanted] Other (See Comments)    Triggered A-Fib   Other Other (See Comments)    Anesthesia- Takes a lot to anesthetize the patient   Prednisone Palpitations and Other (See Comments)    Tachycardia     Level of Care/Admitting Diagnosis ED Disposition     ED Disposition  Admit   Condition  --   Comment  Hospital [facility]: [facility] [[id number]]  Level of Care: Telemetry Cardiac [103]  May admit patient to [facility] or [facility] if equivalent level of care is available:: No  Covid Evaluation: Asymptomatic - no recent exposure (last 10 days) testing not required  Diagnosis: Atrial fibrillation with RVR [facility]) [[id number]]  Admitting Physician: Antoine Poche [[id number]]  Attending Physician: Antoine Poche [id number]  Certification:: I certify this patient will need inpatient services for at least 2 midnights  Estimated Length of Stay: 3          B Medical/Surgery History Past Medical History:  Diagnosis Date   Anticoagulant long-term use 11/16/2014   Anxiety 10/06/2017    Arthritis 10/06/2017   knees bilateral   Asthma 11/16/2014   Esophageal reflux 11/16/2014   Essential hypertension 08/15/2015   pt denies   H/O: GI bleed 12/11/2016   Hyperlipidemia 11/17/2014   Migraine headache 11/16/2014   Nausea 11/16/2014   Pain in limb 11/16/2014   Panic disorder 11/16/2014   Paroxysmal atrial fibrillation (HCC) 11/17/2014   Swelling of joint 11/16/2014   Symptomatic anemia 11/24/2016   Tendonitis 11/16/2014   UTI (urinary tract infection) 12/19/2015   Past Surgical History:  Procedure Laterality Date   ABDOMINAL HYSTERECTOMY     ATRIAL FIBRILLATION ABLATION N/A 03/14/2020   Procedure: ATRIAL FIBRILLATION ABLATION;  Surgeon: Hillis Range, MD;  Location: [facility] INVASIVE CV LAB;  Service: Cardiovascular;  Laterality: N/A;   BIOPSY  11/22/2021   Procedure: BIOPSY;  Surgeon: Jenel Lucks, MD;  Location: [facility] ENDOSCOPY;  Service: Gastroenterology;;   CARDIOVERSION     CARDIOVERSION N/A 06/21/2022   Procedure: CARDIOVERSION;  Surgeon: Chrystie Nose, MD;  Location: [facility] ENDOSCOPY;  Service: Cardiovascular;  Laterality: N/A;   CESAREAN SECTION     COLONOSCOPY     COLONOSCOPY N/A 11/22/2021   Procedure: COLONOSCOPY;  Surgeon: Jenel Lucks, MD;  Location: [facility] ENDOSCOPY;  Service: Gastroenterology;  Laterality: N/A;   ESOPHAGOGASTRODUODENOSCOPY (EGD) WITH PROPOFOL N/A 11/22/2021   Procedure: ESOPHAGOGASTRODUODENOSCOPY (EGD) WITH PROPOFOL;  Surgeon: Jenel Lucks, MD;  Location: [facility] ENDOSCOPY;  Service: Gastroenterology;  Laterality: N/A;   GIVENS CAPSULE STUDY N/A 11/22/2021   Procedure: GIVENS CAPSULE STUDY;  Surgeon: Jenel Lucks, MD;  Location: [facility] ENDOSCOPY;  Service: Gastroenterology;  Laterality: N/A;   LOOP RECORDER INSERTION N/A 02/04/2018   Procedure: LOOP RECORDER INSERTION;  Surgeon: Hillis Range, MD;  Location: [facility] INVASIVE CV LAB;  Service: Cardiovascular;  Laterality: N/A;   POLYPECTOMY  11/22/2021   Procedure: POLYPECTOMY;  Surgeon: Jenel Lucks, MD;   Location: [facility] ENDOSCOPY;  Service: Gastroenterology;;   Mora Appl CUFF REPAIR Left      A IV Location/Drains/Wounds Patient Lines/Drains/Airways Status     Active Line/Drains/Airways     Name Placement date Placement time Site Days   Peripheral IV 08/17/22 20 G Anterior;Proximal;Right Forearm 08/17/22  1004  Forearm  less than 1            Intake/Output Last 24 hours No intake or output data in the 24 hours ending 08/17/22 1134  Labs/Imaging Results for orders placed or performed during the hospital encounter of 08/17/22 (from the past 48 hour(s))  CBC     Status: Abnormal   Collection Time: 08/17/22 10:01 AM  Result Value Ref Range   WBC 3.2 (L) 4.0 - 10.5 K/uL   RBC 4.14 3.87 - 5.11 MIL/uL   Hemoglobin 12.2 12.0 - 15.0 g/dL   HCT 16.1 09.6 - 04.5 %   MCV 93.0 80.0 - 100.0 fL   MCH 29.5 26.0 - 34.0 pg   MCHC 31.7 30.0 - 36.0 g/dL   RDW 40.9 (H) 81.1 - 91.4 %   Platelets 275 150 - 400 K/uL   nRBC 0.0 0.0 - 0.2 %    Comment: Performed at [facility], [address], [place], [place] [address]  Protime-INR- (order if Patient is taking Coumadin / Warfarin)     Status: Abnormal   Collection Time: 08/17/22 10:01 AM  Result Value Ref Range   Prothrombin Time 16.1 (H) 11.4 - 15.2 seconds   INR 1.3 (H) 0.8 - 1.2    Comment: (NOTE) INR goal varies based on device and disease states. Performed at [facility], [address], [place], [place] [address]    No results found.  Pending Labs Unresulted Labs (From admission, onward)     Start     Ordered   08/17/22 1105  Basic metabolic panel  Once,   STAT        08/17/22 1105   08/17/22 1016  Magnesium  Once,   STAT        08/17/22 1016   Signed and Held  Basic metabolic panel  Tomorrow morning,   R        Signed and Held   Signed and Held  Lipid panel  Tomorrow morning,   R        Signed and Held   Signed and Held  CBC  Tomorrow morning,   R        Signed and Held            Vitals/Pain Today's  Vitals   08/17/22 1000 08/17/22 1030 08/17/22 1035 08/17/22 1115  BP:  (!) 127/110  (!) 139/108  Pulse:  (!) 127 (!) 118 (!) 117  Resp:  (!) 21 18 (!) 22  Temp:      TempSrc:      SpO2:  98% 100% 98%  Weight: 91.2 kg     Height: 5\' 8"  (1.727 m)     PainSc:        Isolation Precautions No active isolations  Medications Medications  0.9 %  sodium chloride infusion ( Intravenous New Bag/Given 08/17/22 1042)  amiodarone (NEXTERONE) 1.8 mg/mL load via infusion 150 mg (150 mg Intravenous Bolus from Bag 08/17/22 1044)    Followed by  amiodarone (NEXTERONE PREMIX) 360-4.14 MG/200ML-% (1.8 mg/mL) IV infusion (60 mg/hr Intravenous New Bag/Given 08/17/22 1044)    Followed by  amiodarone (NEXTERONE PREMIX) 360-4.14 MG/200ML-% (1.8 mg/mL) IV infusion (has no administration in time range)  metoprolol tartrate (LOPRESSOR) injection 5 mg (has no administration in time range)    Mobility walks     Focused Assessments Cardiac Assessment Handoff:  Cardiac Rhythm: Atrial fibrillation Lab Results  Component Value Date   TROPONINI <0.03 09/29/2018   No results found for: "DDIMER" Does the Patient currently have chest pain? Yes    R Recommendations: See Admitting Provider Note  Report given to:   Additional Notes:

## 2022-08-17 NOTE — H&P (Signed)
Cardiology Admission History and Physical   Patient ID: Nicole [name] MRN: [id number]; DOB: 1950/05/29   Admission date: 08/17/2022  PCP:  Jearld Lesch, MD   [facility] Providers Cardiologist:  None  Electrophysiologist:  Maurice Small, MD  {     Chief Complaint:  Palpitaitons  Patient Profile:   Nicole [name] is a 72 y.o. adult with extensive history of PAF with two prior ablations with repeat ablation planned for 08/2022 on home amio who is being seen 08/17/2022 for the evaluation of palpitations  History of Present Illness:   Nicole [name] 72 yo female extensive history of PAF. Prior ablation x 2 with most recent 02/2020 with repeat ablation planned for 09/05/22. Multiple ER visits this month with recurrent afib, with DCCV 4/10 and 4/15. Notes also report she has failed [place], has been on oral amio at home. 08/06/22 appt afib clinic oral amio increased to 200mg  bid x 2 weeks, then back to 200mg  daily. Has also been on dilt 360mg  daily, eliquis 5mg  bid. Onset of palpitations and SOB, can get some central chest pressure as well when in afib   WBC 3.2 Hgb 12.2 Plt 275 BMET pending Mg pending EKG afib RVR 11/2021 echo: LVEF 60-65%, no WMAs, grade II dd, normal LA  Past Medical History:  Diagnosis Date   Anticoagulant long-term use 11/16/2014   Anxiety 10/06/2017   Arthritis 10/06/2017   knees bilateral   Asthma 11/16/2014   Esophageal reflux 11/16/2014   Essential hypertension 08/15/2015   pt denies   H/O: GI bleed 12/11/2016   Hyperlipidemia 11/17/2014   Migraine headache 11/16/2014   Nausea 11/16/2014   Pain in limb 11/16/2014   Panic disorder 11/16/2014   Paroxysmal atrial fibrillation (HCC) 11/17/2014   Swelling of joint 11/16/2014   Symptomatic anemia 11/24/2016   Tendonitis 11/16/2014   UTI (urinary tract infection) 12/19/2015    Past Surgical History:  Procedure Laterality Date   ABDOMINAL HYSTERECTOMY     ATRIAL FIBRILLATION ABLATION N/A 03/14/2020    Procedure: ATRIAL FIBRILLATION ABLATION;  Surgeon: Hillis Range, MD;  Location: [facility];  Service: Cardiovascular;  Laterality: N/A;   BIOPSY  11/22/2021   Procedure: BIOPSY;  Surgeon: Jenel Lucks, MD;  Location: [facility] ENDOSCOPY;  Service: Gastroenterology;;   CARDIOVERSION     CARDIOVERSION N/A 06/21/2022   Procedure: CARDIOVERSION;  Surgeon: Chrystie Nose, MD;  Location: [facility] ENDOSCOPY;  Service: Cardiovascular;  Laterality: N/A;   CESAREAN SECTION     COLONOSCOPY     COLONOSCOPY N/A 11/22/2021   Procedure: COLONOSCOPY;  Surgeon: Jenel Lucks, MD;  Location: [facility] ENDOSCOPY;  Service: Gastroenterology;  Laterality: N/A;   ESOPHAGOGASTRODUODENOSCOPY (EGD) WITH PROPOFOL N/A 11/22/2021   Procedure: ESOPHAGOGASTRODUODENOSCOPY (EGD) WITH PROPOFOL;  Surgeon: Jenel Lucks, MD;  Location: [facility] ENDOSCOPY;  Service: Gastroenterology;  Laterality: N/A;   GIVENS CAPSULE STUDY N/A 11/22/2021   Procedure: GIVENS CAPSULE STUDY;  Surgeon: Jenel Lucks, MD;  Location: [facility] ENDOSCOPY;  Service: Gastroenterology;  Laterality: N/A;   LOOP RECORDER INSERTION N/A 02/04/2018   Procedure: LOOP RECORDER INSERTION;  Surgeon: Hillis Range, MD;  Location: [facility] INVASIVE CV LAB;  Service: Cardiovascular;  Laterality: N/A;   POLYPECTOMY  11/22/2021   Procedure: POLYPECTOMY;  Surgeon: Jenel Lucks, MD;  Location: [facility] ENDOSCOPY;  Service: Gastroenterology;;   ROTATOR CUFF REPAIR Left      Medications Prior to Admission: Prior to Admission medications   Medication Sig Start Date End Date Taking? Authorizing Provider  acetaminophen (TYLENOL) 500 MG tablet Take 500 mg by mouth 2 (two) times daily as needed for mild pain, headache, moderate pain or fever.    [provider]  amiodarone (PACERONE) 200 MG tablet Take 1 tablet (200 mg total) by mouth 2 (two) times daily. 08/07/22   Fenton, Clint R, PA  apixaban (ELIQUIS) 5 MG TABS tablet Take 1 tablet (5 mg total) by mouth 2 (two) times daily.  04/23/22   Camnitz, Will Daphine Deutscher, MD  Butalbital-APAP-Caffeine 50-300-40 MG CAPS Take 1 capsule by mouth 3 (three) times daily as needed (migraine). 04/17/22   [provider]  butorphanol (STADOL) 10 MG/ML nasal spray Place 1 spray into the nose every 4 (four) hours as needed for headache or migraine.     [provider]  cetirizine (ZYRTEC) 10 MG tablet Take 10 mg by mouth daily as needed for allergies.    [provider]  clonazePAM (KLONOPIN) 1 MG tablet Take 1 mg by mouth 3 (three) times daily.    [provider]  diltiazem (CARDIZEM CD) 360 MG 24 hr capsule Take 1 capsule (360 mg total) by mouth daily. 07/22/22   Mealor, Roberts Gaudy, MD  diltiazem (CARDIZEM) 30 MG tablet Take 1 Tablet Every 4 Hours As Needed For HR >100 08/07/22   Fenton, Clint R, PA  ferrous sulfate 324 MG TBEC Take 1 tablet (324 mg total) by mouth daily. 07/22/22   Mealor, Roberts Gaudy, MD  fluticasone-salmeterol (ADVAIR HFA) 161-09 MCG/ACT inhaler Inhale 2 puffs into the lungs 2 (two) times daily as needed (asthma).    [provider]  furosemide (LASIX) 20 MG tablet TAKE 1 TABLET BY MOUTH EVERY DAY AS NEEDED FOR EDEMA Patient taking differently: Take 20 mg by mouth daily as needed for edema. 07/23/21   Camnitz, Andree Coss, MD  potassium chloride SA (KLOR-CON M20) 20 MEQ tablet Take 1 tablet (20 mEq total) by mouth daily as needed (take with your furosemide as needed). 08/07/22   Graciella Freer, PA-C  pravastatin (PRAVACHOL) 40 MG tablet Take 40 mg by mouth at bedtime.  11/30/18   [provider]  promethazine (PHENERGAN) 25 MG tablet Take 25 mg by mouth every 6 (six) hours as needed for nausea or vomiting.    [provider]  sertraline (ZOLOFT) 50 MG tablet Take 50 mg by mouth at bedtime. 06/11/21   [provider]  VENTOLIN HFA 108 (90 Base) MCG/ACT inhaler Inhale 2 puffs into the lungs every 6 (six) hours as needed for wheezing or shortness of breath.     [provider]     Allergies:    Allergies  Allergen Reactions   Demerol [Meperidine Hcl] Nausea And Vomiting and Other (See Comments)    Triggers migraine.   Pneumococcal Vaccines Other (See Comments)    Triggered A-Fib   Shingrix [Zoster Vac Recomb Adjuvanted] Other (See Comments)    Triggered A-Fib   Other Other (See Comments)    Anesthesia- Takes a lot to anesthetize the patient   Prednisone Palpitations and Other (See Comments)    Tachycardia     Social History:   Social History   Socioeconomic History   Marital status: Married    Spouse name: Not on file   Number of children: Not on file   Years of education: Not on file   Highest education level: Not on file  Occupational History   Not on file  Tobacco Use   Smoking status: Never   Smokeless tobacco:  Never   Tobacco comments:    Never smoke 07/26/21  Vaping Use   Vaping Use: Never used  Substance and Sexual Activity   Alcohol use: Never   Drug use: Never   Sexual activity: Not on file  Other Topics Concern   Not on file  Social History Narrative   Lives in [place] with spouse   Retired from school system.   Social Determinants of Health   Financial Resource Strain: Not on file  Food Insecurity: No Food Insecurity (06/20/2022)   Hunger Vital Sign    Worried About Running Out of Food in the Last Year: Never true    Ran Out of Food in the Last Year: Never true  Transportation Needs: No Transportation Needs (06/20/2022)   PRAPARE - Administrator, Civil Service (Medical): No    Lack of Transportation (Non-Medical): No  Physical Activity: Not on file  Stress: Not on file  Social Connections: Not on file  Intimate Partner Violence: Not At Risk (06/20/2022)   Humiliation, Afraid, Rape, and Kick questionnaire    Fear of Current or Ex-Partner: No    Emotionally Abused: No    Physically Abused: No    Sexually Abused: No    Family History:   The patient's family history includes Heart  attack in her mother; Heart disease in her mother, sister, sister, sister, sister, and sister.    ROS:  Please see the history of present illness.  All other ROS reviewed and negative.     Physical Exam/Data:   Vitals:   08/17/22 0959 08/17/22 1000  BP: (!) 151/107   Pulse: (!) 142   Resp: 16   Temp: 98.1 F (36.7 C)   TempSrc: Oral   SpO2: 99%   Weight:  91.2 kg  Height:  5\' 8"  (1.727 m)   No intake or output data in the 24 hours ending 08/17/22 1037    08/17/2022   10:00 AM 08/06/2022    9:20 AM 08/04/2022   10:06 PM  Last 3 Weights  Weight (lbs) 201 lb 202 lb 6.4 oz 202 lb  Weight (kg) 91.173 kg 91.808 kg 91.627 kg     Body mass index is 30.56 kg/m.  General:  Well nourished, well developed, in no acute distress HEENT: normal Neck: no JVD Vascular: No carotid bruits; Distal pulses 2+ bilaterally   Cardiac:  irreg, tachy Lungs:  clear to auscultation bilaterally, no wheezing, rhonchi or rales  Abd: soft, nontender, no hepatomegaly  Ext: no edema Musculoskeletal:  No deformities, BUE and BLE strength normal and equal Skin: warm and dry  Neuro:  CNs 2-12 intact, no focal abnormalities noted Psych:  Normal affect      Laboratory Data:  High Sensitivity Troponin:   Recent Labs  Lab 08/04/22 2218  TROPONINIHS 6      ChemistryNo results for input(s): "NA", "K", "CL", "CO2", "GLUCOSE", "BUN", "CREATININE", "CALCIUM", "MG", "GFRNONAA", "GFRAA", "ANIONGAP" in the last 168 hours.  No results for input(s): "PROT", "ALBUMIN", "AST", "ALT", "ALKPHOS", "BILITOT" in the last 168 hours. Lipids No results for input(s): "CHOL", "TRIG", "HDL", "LABVLDL", "LDLCALC", "CHOLHDL" in the last 168 hours. Hematology Recent Labs  Lab 08/17/22 1001  WBC 3.2*  RBC 4.14  HGB 12.2  HCT 38.5  MCV 93.0  MCH 29.5  MCHC 31.7  RDW 16.4*  PLT 275   Thyroid No results for input(s): "TSH", "FREET4" in the last 168 hours. BNPNo results for input(s): "BNP", "PROBNP" in the last 168  hours.  DDimer No results for input(s): "DDIMER" in the last 168 hours.   Radiology/Studies:  No results found.   Assessment and Plan:   1.PAF - Prior ablation x 2 with most recent 02/2020 with repeat ablation planned for 09/05/22.  -Multiple ER visits this month with recurrent afib, with DCCV 4/10 and 4/15. Also DCCV 06/21/22 during admission - Notes also report she has failed tikasyn, has been on oral amio at home. 08/06/22 appt afib clinic oral amio increased to 200mg  bid x 2 weeks, then back to 200mg  daily   - presents with recurrent symptomatic afib with RVR - will admit for IV amio reloading - conitnue home diltiazem, eliquis - if still in afib Monday may consider repeaet DCCV after amio has reloaded, also consider EP seeing Monday       For questions or updates, please contact [facility] Please consult [email or web address] for contact info under     Signed, Dina Rich, MD  08/17/2022 10:37 AM

## 2022-08-17 NOTE — ED Triage Notes (Addendum)
Pt BIBEMS from home for chronic transient afib. Started 2- 3 hrs ago. Took Cardizem 30 mg x 2 doses,hasn't had any relief. Hx of cardioversion on the 14th of April, denies any missed doses of eliquis.  130-140 hr 142/90 98% RA  Scheduled ablasion next month.

## 2022-08-17 NOTE — Plan of Care (Signed)

## 2022-08-18 ENCOUNTER — Other Ambulatory Visit: Payer: Self-pay | Admitting: Cardiology

## 2022-08-18 DIAGNOSIS — I480 Paroxysmal atrial fibrillation: Secondary | ICD-10-CM | POA: Diagnosis not present

## 2022-08-18 LAB — BASIC METABOLIC PANEL
Anion gap: 7 (ref 5–15)
BUN: 8 mg/dL (ref 8–23)
CO2: 24 mmol/L (ref 22–32)
Calcium: 8.5 mg/dL — ABNORMAL LOW (ref 8.9–10.3)
Chloride: 105 mmol/L (ref 98–111)
Creatinine, Ser: 0.96 mg/dL (ref 0.44–1.00)
GFR, Estimated: >60 mL/min (ref >60)
Glucose, Bld: 94 mg/dL (ref 70–99)
Potassium: 3.6 mmol/L (ref 3.5–5.1)
Sodium: 136 mmol/L (ref 135–145)

## 2022-08-18 LAB — LIPID PANEL
Cholesterol: 125 mg/dL (ref 0–200)
HDL: 29 mg/dL — ABNORMAL LOW (ref >40)
LDL Cholesterol: 75 mg/dL (ref 0–99)
Total CHOL/HDL Ratio: 4.3 RATIO
Triglycerides: 106 mg/dL (ref <150)
VLDL: 21 mg/dL (ref 0–40)

## 2022-08-18 LAB — CBC
HCT: 31.1 % — ABNORMAL LOW (ref 36.0–46.0)
Hemoglobin: 10.4 g/dL — ABNORMAL LOW (ref 12.0–15.0)
MCH: 30.4 pg (ref 26.0–34.0)
MCHC: 33.4 g/dL (ref 30.0–36.0)
MCV: 90.9 fL (ref 80.0–100.0)
Platelets: 217 10*3/uL (ref 150–400)
RBC: 3.42 MIL/uL — ABNORMAL LOW (ref 3.87–5.11)
RDW: 16.7 % — ABNORMAL HIGH (ref 11.5–15.5)
WBC: 3.2 10*3/uL — ABNORMAL LOW (ref 4.0–10.5)
nRBC: 0 % (ref 0.0–0.2)

## 2022-08-18 MED ORDER — AMIODARONE HCL 200 MG PO TABS: ORAL_TABLET | ORAL | 1 refills | Status: DC

## 2022-08-18 MED ORDER — AMIODARONE HCL 200 MG PO TABS
200.0000 mg | ORAL_TABLET | Freq: Two times a day (BID) | ORAL | Status: DC
  Administered 2022-08-18: 200 mg via ORAL
  Filled 2022-08-18: qty 1

## 2022-08-18 NOTE — Discharge Summary (Signed)
Discharge Summary    Patient ID: Nicole [name] MRN: [id number]; DOB: Mar 04, 1951  Admit date: 08/17/2022 Discharge date: 08/18/2022  PCP:  Jearld Lesch, MD   [facility] Providers Cardiologist:  None  Electrophysiologist:  Maurice Small, MD       Discharge Diagnoses    Principal Problem:   Atrial fibrillation with RVR [facility])    Diagnostic Studies/Procedures    N/A _____________   History of Present Illness     Nicole [name] is a 72 y.o. adult with extensive history of PAF with two prior ablations with repeat ablation planned for 08/2022 on home amio. Patient seen and admitted 08/17/2022 for the evaluation of palpitations, chest tightness (onset in the early morning hours of 4/27). Of note, multiple ER visits for recurrent afib.   Hospital Course     Consultants: n/a   Paroxysmal afib with RVR Secondary hypercoagulable state  Multiple ED visits in last few weeks for afib. S/p DCCV on 4/10 and 4/15. Has also failed Tikosyn. At 4/16 clinic appointment, her amiodarone was increased to 200mg  BID x2 weeks then reduced back to 200mg  daily. Patient admitted on 4/27 for IV amiodarone re-loading. Following this re-load, patient with conversion back to NSR on the evening of 4/27.   Continue Amiodarone 200mg  BID x14 days then transition to 200mg  daily.  Continue Eliquis 5mg  BID Continue Cardizem CD 24 hr 360mg  Plan for ablation as previously scheduled in May   Hypertension  Stable on current regimen of Cardizem CD 360mg .  Hyperlipidemia  Continue daily statin.  Patient seen by Dr. Wyline Mood and deemed stable for discharge. See same day progress note for physical exam.      Did the patient have an acute coronary syndrome (MI, NSTEMI, STEMI, etc) this admission?:  No                               Did the patient have a percutaneous coronary intervention (stent / angioplasty)?:  No.          _____________  Discharge Vitals Blood pressure 108/78, pulse  61, temperature 98.3 F (36.8 C), temperature source Oral, resp. rate 20, height 5\' 8"  (1.727 m), weight 93 kg, SpO2 100 %.  Filed Weights   08/17/22 1000 08/17/22 1244 08/18/22 0338  Weight: 91.2 kg 92.4 kg 93 kg     Labs & Radiologic Studies    CBC Recent Labs    08/17/22 1001 08/18/22 0203  WBC 3.2* 3.2*  HGB 12.2 10.4*  HCT 38.5 31.1*  MCV 93.0 90.9  PLT 275 217   Basic Metabolic Panel Recent Labs    69/62/95 1424 08/18/22 0203  NA 135 136  K 4.2 3.6  CL 102 105  CO2 22 24  GLUCOSE 111* 94  BUN 9 8  CREATININE 1.06* 0.96  CALCIUM 9.6 8.5*  MG 2.0  --    Liver Function Tests No results for input(s): "AST", "ALT", "ALKPHOS", "BILITOT", "PROT", "ALBUMIN" in the last 72 hours. No results for input(s): "LIPASE", "AMYLASE" in the last 72 hours. High Sensitivity Troponin:   Recent Labs  Lab 08/04/22 2218  TROPONINIHS 6    BNP Invalid input(s): "POCBNP" D-Dimer No results for input(s): "DDIMER" in the last 72 hours. Hemoglobin A1C No results for input(s): "HGBA1C" in the last 72 hours. Fasting Lipid Panel Recent Labs    08/18/22 0203  CHOL 125  HDL 29*  LDLCALC 75  TRIG 284  CHOLHDL 4.3   Thyroid Function Tests No results for input(s): "TSH", "T4TOTAL", "T3FREE", "THYROIDAB" in the last 72 hours.  Invalid input(s): "FREET3" _____________  DG Chest 2 View  Result Date: 08/04/2022 CLINICAL DATA:  Chest pain EXAM: CHEST - 2 VIEW COMPARISON:  07/15/2022 FINDINGS: Lungs are clear.  No pleural effusion or pneumothorax. The heart is normal in size. Mild degenerative changes of the visualized thoracolumbar spine. IMPRESSION: Normal chest radiographs. Electronically Signed   By: Charline Bills M.D.   On: 08/04/2022 22:46   Disposition   Pt is being discharged home today in good condition.  Follow-up Plans & Appointments        Discharge Medications   Allergies as of 08/18/2022       Reactions   Demerol [meperidine Hcl] Nausea And Vomiting,  Other (See Comments)   Triggers migraine.   Pneumococcal Vaccines Other (See Comments)   Triggered A-Fib   Shingrix [zoster Vac Recomb Adjuvanted] Other (See Comments)   Triggered A-Fib   Other Other (See Comments)   Anesthesia- Takes a lot to anesthetize the patient   Prednisone Palpitations, Other (See Comments)   Tachycardia         Medication List     STOP taking these medications    furosemide 20 MG tablet Commonly known as: LASIX       TAKE these medications    acetaminophen 500 MG tablet Commonly known as: TYLENOL Take 500 mg by mouth 2 (two) times daily as needed for mild pain, headache, moderate pain or fever.   amiodarone 200 MG tablet Commonly known as: PACERONE Take 1 tablet (200 mg total) by mouth 2 (two) times daily for 14 days, THEN 1 tablet (200 mg total) daily. Start taking on: August 18, 2022 What changed: See the new instructions.   apixaban 5 MG Tabs tablet Commonly known as: Eliquis Take 1 tablet (5 mg total) by mouth 2 (two) times daily.   Butalbital-APAP-Caffeine 50-300-40 MG Caps Take 1 capsule by mouth 3 (three) times daily as needed (migraine).   butorphanol 10 MG/ML nasal spray Commonly known as: STADOL Place 1 spray into the nose every 4 (four) hours as needed for headache or migraine.   cetirizine 10 MG tablet Commonly known as: ZYRTEC Take 10 mg by mouth daily as needed for allergies.   clonazePAM 1 MG tablet Commonly known as: KLONOPIN Take 1 mg by mouth 3 (three) times daily.   diltiazem 30 MG tablet Commonly known as: CARDIZEM Take 1 Tablet Every 4 Hours As Needed For HR >100 What changed:  how much to take how to take this when to take this reasons to take this additional instructions   diltiazem 360 MG 24 hr capsule Commonly known as: Cardizem CD Take 1 capsule (360 mg total) by mouth daily.   ferrous sulfate 324 MG Tbec Take 1 tablet (324 mg total) by mouth daily.   fluticasone-salmeterol 115-21 MCG/ACT  inhaler Commonly known as: ADVAIR HFA Inhale 2 puffs into the lungs 2 (two) times daily as needed (asthma).   potassium chloride SA 20 MEQ tablet Commonly known as: [place]-Con M20 Take 1 tablet (20 mEq total) by mouth daily as needed (take with your furosemide as needed).   pravastatin 40 MG tablet Commonly known as: PRAVACHOL Take 40 mg by mouth at bedtime.   promethazine 25 MG tablet Commonly known as: PHENERGAN Take 25 mg by mouth every 6 (six) hours as needed for nausea or vomiting.   sertraline 50 MG tablet Commonly  known as: ZOLOFT Take 50 mg by mouth at bedtime.   Ventolin HFA 108 (90 Base) MCG/ACT inhaler Generic drug: albuterol Inhale 2 puffs into the lungs every 6 (six) hours as needed for wheezing or shortness of breath.           Outstanding Labs/Studies     Duration of Discharge Encounter   Greater than 30 minutes including physician time.  Con Memos, PA-C 08/18/2022, 10:43 AM

## 2022-08-18 NOTE — Care Management CC44 (Signed)
Condition Code 44 Documentation Completed  Patient Details  Name: Nicole [name] MRN: [id number] Date of Birth: 04/10/1951   Condition Code 44 given:  Yes Patient signature on Condition Code 44 notice:  Yes Documentation of 2 MD's agreement:  Yes Code 44 added to claim:  Yes    Adlynn Lowenstein G., RN 08/18/2022, 11:29 AM

## 2022-08-18 NOTE — Progress Notes (Signed)
Rounding Note    Patient Name: Nicole [name] Date of Encounter: 08/18/2022  [facility] Cardiologist: Mealor  Subjective   No complaints. Converted to SR yesterday evening.   Inpatient Medications    Scheduled Meds:  apixaban  5 mg Oral BID   clonazePAM  1 mg Oral TID   diltiazem  360 mg Oral Daily   ferrous sulfate  324 mg Oral Daily   pravastatin  40 mg Oral QHS   sertraline  50 mg Oral QHS   Continuous Infusions:  sodium chloride 75 mL/hr at 08/18/22 0714   sodium chloride     amiodarone 30 mg/hr (08/18/22 1009)   PRN Meds: acetaminophen, butorphanol, ondansetron (ZOFRAN) IV, potassium chloride SA   Vital Signs    Vitals:   08/17/22 2015 08/17/22 2300 08/18/22 0338 08/18/22 0921  BP: 103/72 (!) 114/58 112/64 108/78  Pulse: 60 65 62 61  Resp: 18 20 20 20   Temp: 98.3 F (36.8 C) 98.2 F (36.8 C) 98.3 F (36.8 C) 98.3 F (36.8 C)  TempSrc: Oral Oral Oral Oral  SpO2: 98% 96% 96% 100%  Weight:   93 kg   Height:        Intake/Output Summary (Last 24 hours) at 08/18/2022 1015 Last data filed at 08/18/2022 0900 Gross per 24 hour  Intake 2274.17 ml  Output --  Net 2274.17 ml      08/18/2022    3:38 AM 08/17/2022   12:44 PM 08/17/2022   10:00 AM  Last 3 Weights  Weight (lbs) 205 lb 0.4 oz 203 lb 11.3 oz 201 lb  Weight (kg) 93 kg 92.4 kg 91.173 kg      Telemetry    NSR - Personally Reviewed  ECG    N/a - Personally Reviewed  Physical Exam   GEN: No acute distress.   Neck: No JVD Cardiac: RRR, no murmurs, rubs, or gallops.  Respiratory: Clear to auscultation bilaterally. GI: Soft, nontender, non-distended  MS: No edema; No deformity. Neuro:  Nonfocal  Psych: Normal affect   Labs    High Sensitivity Troponin:   Recent Labs  Lab 08/04/22 2218  TROPONINIHS 6     Chemistry Recent Labs  Lab 08/17/22 1424 08/18/22 0203  NA 135 136  K 4.2 3.6  CL 102 105  CO2 22 24  GLUCOSE 111* 94  BUN 9 8  CREATININE 1.06* 0.96   CALCIUM 9.6 8.5*  MG 2.0  --   GFRNONAA 56* >60  ANIONGAP 11 7    Lipids  Recent Labs  Lab 08/18/22 0203  CHOL 125  TRIG 106  HDL 29*  LDLCALC 75  CHOLHDL 4.3    Hematology Recent Labs  Lab 08/17/22 1001 08/18/22 0203  WBC 3.2* 3.2*  RBC 4.14 3.42*  HGB 12.2 10.4*  HCT 38.5 31.1*  MCV 93.0 90.9  MCH 29.5 30.4  MCHC 31.7 33.4  RDW 16.4* 16.7*  PLT 275 217   Thyroid No results for input(s): "TSH", "FREET4" in the last 168 hours.  BNPNo results for input(s): "BNP", "PROBNP" in the last 168 hours.  DDimer No results for input(s): "DDIMER" in the last 168 hours.   Radiology    No results found.  Cardiac Studies     Patient Profile     Nicole [name] is a 72 y.o. adult with extensive history of PAF with two prior ablations with repeat ablation planned for 08/2022 on home amio who is being seen 08/17/2022 for the evaluation of palpitations  Assessment & Plan    1.PAF - Prior ablation x 2 with most recent 02/2020 with repeat ablation planned for 09/05/22.  -Multiple ER visits this month with recurrent afib, with DCCV 4/10 and 4/15. Also DCCV 06/21/22 during admission - Notes also report she has failed tikasyn, has been on oral amio at home. 08/06/22 appt afib clinic oral amio increased to 200mg  bid x 2 weeks, then back to 200mg  daily    - presents with recurrent symptomatic afib with RVR - admited for IV amio reloading - conitnue home diltiazem, eliquis  - converted to NSR yesterday evening and has remained in it. Symptoms have resolved. - has completed 24 hrs of amio reload, transition to oral 200mg  bid x 2 weeks then 200mg  daily. Plan for ablation as previously scheduled in May  Will discharge home today  For questions or updates, please contact [facility] Please consult [email or web address] for contact info under        Signed, Dina Rich, MD  08/18/2022, 10:15 AM

## 2022-08-18 NOTE — Care Management Obs Status (Signed)
MEDICARE OBSERVATION STATUS NOTIFICATION   Patient Details  Name: Nicole [name] MRN: [id number] Date of Birth: 1950/04/25   Medicare Observation Status Notification Given:  Yes    Domani Bakos G., RN 08/18/2022, 11:29 AM

## 2022-08-28 ENCOUNTER — Telehealth (HOSPITAL_COMMUNITY): Payer: Self-pay | Admitting: Emergency Medicine

## 2022-08-28 NOTE — Telephone Encounter (Signed)
Reaching out to patient to offer assistance regarding upcoming cardiac imaging study; pt verbalizes understanding of appt date/time, parking situation and where to check in, pre-test NPO status and medications ordered, and verified current allergies; name and call back number provided for further questions should they arise Karlene Southard RN Navigator Cardiac Imaging [facility] [phone] office [phone] cell 

## 2022-08-29 ENCOUNTER — Ambulatory Visit (HOSPITAL_BASED_OUTPATIENT_CLINIC_OR_DEPARTMENT_OTHER)
Admission: RE | Admit: 2022-08-29 | Discharge: 2022-08-29 | Disposition: A | Discharge status: Home / Self Care | Payer: Medicare Other | Source: Ambulatory Visit | Attending: Cardiovascular Disease | Admitting: Cardiovascular Disease

## 2022-08-29 ENCOUNTER — Telehealth: Payer: Self-pay | Admitting: Cardiovascular Disease

## 2022-08-29 ENCOUNTER — Encounter (HOSPITAL_BASED_OUTPATIENT_CLINIC_OR_DEPARTMENT_OTHER): Payer: Self-pay

## 2022-08-29 DIAGNOSIS — I480 Paroxysmal atrial fibrillation: Secondary | ICD-10-CM | POA: Diagnosis not present

## 2022-08-29 MED ORDER — IOHEXOL 350 MG/ML SOLN
100.0000 mL | Freq: Once | INTRAVENOUS | Status: AC | PRN
  Administered 2022-08-29: 80 mL via INTRAVENOUS

## 2022-08-29 NOTE — Telephone Encounter (Signed)
New Message:     Patient wants to know what medicine is she supposed to take or not take the day of her Ablation?

## 2022-08-29 NOTE — Telephone Encounter (Signed)
Called pt advised not to take any medications the morning of Ablation.  Advised pt to refer to Ablation instruction letter.  Pt reports was not given a letter.  Advised pt to review instructions under letter tab on my chart.  Pt expresses understanding all questions answered.

## 2022-09-01 ENCOUNTER — Other Ambulatory Visit: Payer: Self-pay

## 2022-09-01 ENCOUNTER — Encounter (HOSPITAL_COMMUNITY): Payer: Self-pay

## 2022-09-01 ENCOUNTER — Emergency Department (HOSPITAL_COMMUNITY)
Admission: EM | Admit: 2022-09-01 | Discharge: 2022-09-01 | Disposition: A | Discharge status: Home / Self Care | Payer: Medicare Other | Attending: Emergency Medicine | Admitting: Emergency Medicine

## 2022-09-01 DIAGNOSIS — I10 Essential (primary) hypertension: Secondary | ICD-10-CM | POA: Diagnosis not present

## 2022-09-01 DIAGNOSIS — R000 Tachycardia, unspecified: Secondary | ICD-10-CM | POA: Diagnosis not present

## 2022-09-01 DIAGNOSIS — R0789 Other chest pain: Secondary | ICD-10-CM | POA: Insufficient documentation

## 2022-09-01 DIAGNOSIS — J45909 Unspecified asthma, uncomplicated: Secondary | ICD-10-CM | POA: Diagnosis not present

## 2022-09-01 DIAGNOSIS — Z7901 Long term (current) use of anticoagulants: Secondary | ICD-10-CM | POA: Insufficient documentation

## 2022-09-01 DIAGNOSIS — I4891 Unspecified atrial fibrillation: Secondary | ICD-10-CM | POA: Diagnosis not present

## 2022-09-01 LAB — BASIC METABOLIC PANEL
Anion gap: 10 (ref 5–15)
BUN: 10 mg/dL (ref 8–23)
CO2: 21 mmol/L — ABNORMAL LOW (ref 22–32)
Calcium: 9.2 mg/dL (ref 8.9–10.3)
Chloride: 106 mmol/L (ref 98–111)
Creatinine, Ser: 1.07 mg/dL — ABNORMAL HIGH (ref 0.44–1.00)
GFR, Estimated: 56 mL/min — ABNORMAL LOW (ref >60)
Glucose, Bld: 101 mg/dL — ABNORMAL HIGH (ref 70–99)
Potassium: 3.7 mmol/L (ref 3.5–5.1)
Sodium: 137 mmol/L (ref 135–145)

## 2022-09-01 LAB — HEPATIC FUNCTION PANEL
ALT: 29 U/L (ref 0–44)
AST: 32 U/L (ref 15–41)
Albumin: 3.8 g/dL (ref 3.5–5.0)
Alkaline Phosphatase: 89 U/L (ref 38–126)
Bilirubin, Direct: 0.1 mg/dL (ref 0.0–0.2)
Indirect Bilirubin: 0.3 mg/dL (ref 0.3–0.9)
Total Bilirubin: 0.4 mg/dL (ref 0.3–1.2)
Total Protein: 7.6 g/dL (ref 6.5–8.1)

## 2022-09-01 LAB — CBC
HCT: 38.1 % (ref 36.0–46.0)
Hemoglobin: 11.8 g/dL — ABNORMAL LOW (ref 12.0–15.0)
MCH: 29.1 pg (ref 26.0–34.0)
MCHC: 31 g/dL (ref 30.0–36.0)
MCV: 94.1 fL (ref 80.0–100.0)
Platelets: 302 10*3/uL (ref 150–400)
RBC: 4.05 MIL/uL (ref 3.87–5.11)
RDW: 15.4 % (ref 11.5–15.5)
WBC: 3.7 10*3/uL — ABNORMAL LOW (ref 4.0–10.5)
nRBC: 0 % (ref 0.0–0.2)

## 2022-09-01 LAB — TSH: TSH: 2.57 u[IU]/mL (ref 0.350–4.500)

## 2022-09-01 LAB — MAGNESIUM: Magnesium: 2.2 mg/dL (ref 1.7–2.4)

## 2022-09-01 MED ORDER — FUROSEMIDE 10 MG/ML IJ SOLN
20.0000 mg | Freq: Once | INTRAMUSCULAR | Status: AC
  Administered 2022-09-01: 20 mg via INTRAVENOUS
  Filled 2022-09-01: qty 2

## 2022-09-01 MED ORDER — ETOMIDATE 2 MG/ML IV SOLN
0.1000 mg/kg | Freq: Once | INTRAVENOUS | Status: AC
  Administered 2022-09-01: 10 mg via INTRAVENOUS
  Filled 2022-09-01: qty 10

## 2022-09-01 MED ORDER — AMIODARONE IV BOLUS ONLY 150 MG/100ML
150.0000 mg | Freq: Once | INTRAVENOUS | Status: DC
  Administered 2022-09-01: 150 mg via INTRAVENOUS
  Filled 2022-09-01: qty 100

## 2022-09-01 MED ORDER — DILTIAZEM HCL ER COATED BEADS 180 MG PO CP24: 360.0000 mg | ORAL_CAPSULE | Freq: Once | ORAL | Status: DC

## 2022-09-01 MED ORDER — LACTATED RINGERS IV SOLN: INTRAVENOUS | Status: DC

## 2022-09-01 MED ORDER — AMIODARONE HCL 200 MG PO TABS
200.0000 mg | ORAL_TABLET | Freq: Every day | ORAL | Status: DC
  Filled 2022-09-01: qty 1

## 2022-09-01 MED ORDER — ONDANSETRON HCL 4 MG/2ML IJ SOLN
4.0000 mg | Freq: Once | INTRAMUSCULAR | Status: AC
  Administered 2022-09-01: 4 mg via INTRAVENOUS
  Filled 2022-09-01: qty 2

## 2022-09-01 NOTE — ED Triage Notes (Signed)
Pt BIB [facility] from home for a-fib starting around 12pm today, patient followed Dr's directions for additional dose of cardizem with no change. Hx of cardioversion. Ablation scheduled for Thursday. Chest pressure reported, not really pain, A&Ox4.

## 2022-09-01 NOTE — Discharge Instructions (Signed)
Resume taking amiodarone, 200 mg, twice a day.  Continue your other home medications.  Follow-up, as scheduled, with cardiology this week.  Return to the emergency department for any return of concerning symptoms.

## 2022-09-01 NOTE — ED Provider Notes (Signed)
[facility] AT [facility] Provider Note   CSN: [id number] Arrival date & time: 09/01/22  1359     History  Chief Complaint  Patient presents with   Tachycardia    Nicole [name] is a 72 y.o. adult.  HPI Patient presents for tachycardia.  She has a history of frequent recurrences of atrial fibrillation.  She generally knows when she goes into this abnormal rhythm.  She has undergone multiple cardioversions this year.  She was hospitalized 2 weeks ago for atrial fibrillation.  During that time, they increased her amiodarone and she converted to normal sinus rhythm.  She has been on extra amiodarone over the past 2 weeks.  She has since come back down on her dose.  Today noon, she noticed that she has gone back into atrial fibrillation.  She endorses some mild chest pressure but denies any other symptoms.  EMS noted heart rate that ranged from 120s to 150s with irregular rhythm.  Blood pressures remained on the elevated side.  Patient has been adherent to all of her medications, including her eliquis.    Home Medications Prior to Admission medications   Medication Sig Start Date End Date Taking? Authorizing Provider  acetaminophen (TYLENOL) 500 MG tablet Take 500 mg by mouth 2 (two) times daily as needed for mild pain, headache, moderate pain or fever.    [provider]  amiodarone (PACERONE) 200 MG tablet Take 1 tablet (200 mg total) by mouth 2 (two) times daily for 14 days, THEN 1 tablet (200 mg total) daily. 08/18/22 10/31/22  Perlie Gold, PA-C  apixaban (ELIQUIS) 5 MG TABS tablet Take 1 tablet (5 mg total) by mouth 2 (two) times daily. 04/23/22   Camnitz, Will Daphine Deutscher, MD  Butalbital-APAP-Caffeine 50-300-40 MG CAPS Take 1 capsule by mouth 3 (three) times daily as needed (migraine). 04/17/22   [provider]  butorphanol (STADOL) 10 MG/ML nasal spray Place 1 spray into the nose every 4 (four) hours as needed for headache or migraine.      [provider]  cetirizine (ZYRTEC) 10 MG tablet Take 10 mg by mouth daily as needed for allergies.    [provider]  clonazePAM (KLONOPIN) 1 MG tablet Take 1 mg by mouth 3 (three) times daily.    [provider]  diltiazem (CARDIZEM CD) 360 MG 24 hr capsule Take 1 capsule (360 mg total) by mouth daily. 07/22/22   Mealor, Roberts Gaudy, MD  diltiazem (CARDIZEM) 30 MG tablet Take 1 Tablet Every 4 Hours As Needed For HR >100 Patient taking differently: Take 30 mg by mouth every 4 (four) hours as needed (For Heart Rate over 100). 08/07/22   Fenton, Clint R, PA  ferrous sulfate 324 MG TBEC Take 1 tablet (324 mg total) by mouth daily. 07/22/22   Mealor, Roberts Gaudy, MD  fluticasone-salmeterol (ADVAIR HFA) 782-95 MCG/ACT inhaler Inhale 2 puffs into the lungs 2 (two) times daily as needed (asthma).    [provider]  furosemide (LASIX) 20 MG tablet TAKE 1 TABLET BY MOUTH EVERY DAY AS NEEDED FOR EDEMA 08/19/22   Mealor, Roberts Gaudy, MD  potassium chloride SA (KLOR-CON M20) 20 MEQ tablet Take 1 tablet (20 mEq total) by mouth daily as needed (take with your furosemide as needed). Patient not taking: Reported on 08/17/2022 08/07/22   Graciella Freer, PA-C  pravastatin (PRAVACHOL) 40 MG tablet Take 40 mg by mouth at bedtime.  11/30/18   [provider]  promethazine (PHENERGAN) 25  MG tablet Take 25 mg by mouth every 6 (six) hours as needed for nausea or vomiting.    [provider]  sertraline (ZOLOFT) 50 MG tablet Take 50 mg by mouth at bedtime. 06/11/21   [provider]  VENTOLIN HFA 108 (90 Base) MCG/ACT inhaler Inhale 2 puffs into the lungs every 6 (six) hours as needed for wheezing or shortness of breath.    [provider]      Allergies    Demerol [meperidine hcl], Pneumococcal vaccines, Shingrix [zoster vac recomb adjuvanted], Other, and Prednisone    Review of Systems   Review of Systems  Respiratory:  Positive for chest  tightness.   Cardiovascular:  Positive for palpitations.  All other systems reviewed and are negative.   Physical Exam Updated Vital Signs BP (!) 160/91 (BP Location: Right Arm)   Pulse 73   Temp 98.2 F (36.8 C) (Oral)   Resp 20   SpO2 100%  Physical Exam Vitals and nursing note reviewed.  Constitutional:      General: She is not in acute distress.    Appearance: She is well-developed. She is not ill-appearing, toxic-appearing or diaphoretic.  HENT:     Head: Normocephalic and atraumatic.     Right Ear: External ear normal.     Left Ear: External ear normal.     Nose: Nose normal.     Mouth/Throat:     Mouth: Mucous membranes are moist.  Eyes:     Extraocular Movements: Extraocular movements intact.     Conjunctiva/sclera: Conjunctivae normal.  Cardiovascular:     Rate and Rhythm: Tachycardia present. Rhythm irregular.     Heart sounds: No murmur heard. Pulmonary:     Effort: Pulmonary effort is normal. No respiratory distress.     Breath sounds: Normal breath sounds. No wheezing or rales.  Abdominal:     General: There is no distension.     Palpations: Abdomen is soft.     Tenderness: There is no abdominal tenderness.  Musculoskeletal:        General: No swelling. Normal range of motion.     Cervical back: Normal range of motion and neck supple.  Skin:    General: Skin is warm and dry.     Capillary Refill: Capillary refill takes less than 2 seconds.     Coloration: Skin is not jaundiced or pale.  Neurological:     General: No focal deficit present.     Mental Status: She is alert and oriented to person, place, and time.     Cranial Nerves: No cranial nerve deficit.     Sensory: No sensory deficit.     Motor: No weakness.     Coordination: Coordination normal.  Psychiatric:        Mood and Affect: Mood normal.        Behavior: Behavior normal.        Thought Content: Thought content normal.        Judgment: Judgment normal.     ED Results / Procedures /  Treatments   Labs (all labs ordered are listed, but only abnormal results are displayed) Labs Reviewed  BASIC METABOLIC PANEL - Abnormal; Notable for the following components:      Result Value   CO2 21 (*)    Glucose, Bld 101 (*)    Creatinine, Ser 1.07 (*)    GFR, Estimated 56 (*)    All other components within normal limits  CBC - Abnormal; Notable for the  following components:   WBC 3.7 (*)    Hemoglobin 11.8 (*)    All other components within normal limits  MAGNESIUM  TSH  HEPATIC FUNCTION PANEL    EKG None  Radiology No results found.  Procedures .Sedation  Date/Time: 09/01/2022 5:00 PM  Performed by: Gloris Manchester, MD Authorized by: Gloris Manchester, MD   Consent:    Consent obtained:  Verbal and written   Consent given by:  Patient   Risks discussed:  Prolonged hypoxia resulting in organ damage, inadequate sedation, respiratory compromise necessitating ventilatory assistance and intubation, vomiting and nausea Universal protocol:    Immediately prior to procedure, a time out was called: yes     Patient identity confirmed:  Arm band and verbally with patient Indications:    Procedure performed:  Cardioversion   Procedure necessitating sedation performed by:  Physician performing sedation Pre-sedation assessment:    Time since last food or drink:  9 hours   ASA classification: class 3 - patient with severe systemic disease     Mouth opening:  2 finger widths   Thyromental distance:  3 finger widths   Mallampati score:  II - soft palate, uvula, fauces visible   Neck mobility: normal     Pre-sedation assessments completed and reviewed: airway patency, cardiovascular function, hydration status, mental status, nausea/vomiting, pain level and respiratory function     Pre-sedation assessment completed:  09/01/2022 4:30 PM Immediate pre-procedure details:    Reassessment: Patient reassessed immediately prior to procedure     Reviewed: vital signs and NPO status      Verified: bag valve mask available, emergency equipment available, intubation equipment available, IV patency confirmed and oxygen available   Procedure details (see MAR for exact dosages):    Preoxygenation:  Nasal cannula   Sedation:  Etomidate   Intended level of sedation: moderate (conscious sedation)   Analgesia:  None   Intra-procedure monitoring:  Blood pressure monitoring, cardiac monitor, continuous capnometry, continuous pulse oximetry, frequent LOC assessments and frequent vital sign checks   Intra-procedure events: none     Total Provider sedation time (minutes):  12 Post-procedure details:    Post-sedation assessment completed:  09/01/2022 5:30 PM   Attendance: Constant attendance by certified staff until patient recovered     Recovery: Patient returned to pre-procedure baseline     Post-sedation assessments completed and reviewed: airway patency, cardiovascular function, hydration status, mental status, nausea/vomiting, pain level and respiratory function     Patient is stable for discharge or admission: yes     Procedure completion:  Tolerated well, no immediate complications .Cardioversion  Date/Time: 09/01/2022 5:00 PM  Performed by: Gloris Manchester, MD Authorized by: Gloris Manchester, MD   Consent:    Consent obtained:  Verbal and written   Consent given by:  Patient   Risks discussed:  Cutaneous burn, death, induced arrhythmia and pain   Alternatives discussed:  No treatment and delayed treatment Pre-procedure details:    Cardioversion basis:  Elective   Rhythm:  Atrial fibrillation   Electrode placement:  Anterior-posterior Patient sedated: Yes. Refer to sedation procedure documentation for details of sedation.  Attempt one:    Cardioversion mode:  Synchronous   Shock (Joules):  200   Shock outcome:  Conversion to normal sinus rhythm Post-procedure details:    Patient status:  Awake   Patient tolerance of procedure:  Tolerated well, no immediate  complications .Sedation  Date/Time: 09/01/2022 7:46 PM  Performed by: Gloris Manchester, MD Authorized by: Gloris Manchester, MD  Consent:    Consent obtained:  Verbal   Consent given by:  Patient   Risks discussed:  Allergic reaction, dysrhythmia, prolonged hypoxia resulting in organ damage, nausea, vomiting and respiratory compromise necessitating ventilatory assistance and intubation Universal protocol:    Immediately prior to procedure, a time out was called: yes     Patient identity confirmed:  Verbally with patient Indications:    Procedure performed:  Cardioversion   Procedure necessitating sedation performed by:  Physician performing sedation Pre-sedation assessment:    Time since last food or drink:  10 hours   ASA classification: class 3 - patient with severe systemic disease     Mouth opening:  2 finger widths   Thyromental distance:  3 finger widths   Mallampati score:  II - soft palate, uvula, fauces visible   Neck mobility: normal     Pre-sedation assessments completed and reviewed: airway patency, cardiovascular function, hydration status, mental status, nausea/vomiting, pain level and respiratory function     Pre-sedation assessment completed:  09/01/2022 7:15 PM Immediate pre-procedure details:    Reassessment: Patient reassessed immediately prior to procedure     Reviewed: vital signs     Verified: bag valve mask available, emergency equipment available, intubation equipment available, IV patency confirmed and oxygen available   Procedure details (see MAR for exact dosages):    Preoxygenation:  Nasal cannula   Sedation:  Etomidate   Intended level of sedation: moderate (conscious sedation)   Analgesia:  None   Intra-procedure monitoring:  Blood pressure monitoring, cardiac monitor, frequent LOC assessments and frequent vital sign checks   Intra-procedure events: none     Total Provider sedation time (minutes):  12 Post-procedure details:    Post-sedation assessment  completed:  09/01/2022 7:50 PM   Attendance: Constant attendance by certified staff until patient recovered     Recovery: Patient returned to pre-procedure baseline     Post-sedation assessments completed and reviewed: airway patency, cardiovascular function, hydration status, mental status, nausea/vomiting, pain level and respiratory function     Patient is stable for discharge or admission: yes     Procedure completion:  Tolerated well, no immediate complications .Cardioversion  Date/Time: 09/01/2022 7:50 PM  Performed by: Gloris Manchester, MD Authorized by: Gloris Manchester, MD   Consent:    Consent obtained:  Verbal   Consent given by:  Patient   Risks discussed:  Cutaneous burn, death, induced arrhythmia and pain Pre-procedure details:    Cardioversion basis:  Elective   Rhythm:  Atrial fibrillation   Electrode placement:  Anterior-posterior Patient sedated: Yes. Refer to sedation procedure documentation for details of sedation.  Attempt one:    Cardioversion mode:  Synchronous   Shock (Joules):  200   Shock outcome:  Conversion to normal sinus rhythm Post-procedure details:    Patient status:  Awake   Patient tolerance of procedure:  Tolerated well, no immediate complications     Medications Ordered in ED Medications  lactated ringers infusion (0 mLs Intravenous Stopped 09/01/22 1700)  amiodarone (PACERONE) tablet 200 mg (200 mg Oral Patient Refused/Not Given 09/01/22 1745)  diltiazem (CARDIZEM CD) 24 hr capsule 360 mg (360 mg Oral Patient Refused/Not Given 09/01/22 1800)  ondansetron (ZOFRAN) injection 4 mg (4 mg Intravenous Given 09/01/22 1703)  etomidate (AMIDATE) injection 10 mg (10 mg Intravenous Given 09/01/22 1710)  furosemide (LASIX) injection 20 mg (20 mg Intravenous Given 09/01/22 1931)  etomidate (AMIDATE) injection 10 mg (10 mg Intravenous Given 09/01/22 1939)    ED Course/ Medical  Decision Making/ A&P                             Medical Decision Making Amount and/or  Complexity of Data Reviewed Labs: ordered.  Risk Prescription drug management.   This patient presents to the ED for concern of chest tightness and tachycardia, this involves an extensive number of treatment options, and is a complaint that carries with it a high risk of complications and morbidity.  The differential diagnosis includes atrial fibrillation, ACS, electrolyte abnormalities   Co morbidities that complicate the patient evaluation  Anxiety, asthma, atrial fibrillation, migraine headaches, anemia   Additional history obtained:  Additional history obtained from N/A External records from outside source obtained and reviewed including EMR   Lab Tests:  I Ordered, and personally interpreted labs.  The pertinent results include: Normal electrolytes, baseline anemia, baseline leukopenia   Cardiac Monitoring: / EKG:  The patient was maintained on a cardiac monitor.  I personally viewed and interpreted the cardiac monitored which showed an underlying rhythm of: Atrial fibrillation   Consultations Obtained:  I requested consultation with the cardiologist, Dr. Tenny Craw,  and discussed lab and imaging findings as well as pertinent plan - they recommend: Cardioversion, increasing amiodarone to 200 mg twice daily, and outpatient follow-up   Problem List / ED Course / Critical interventions / Medication management  Patient presents for atrial fibrillation.  Onset was 12:00 this afternoon.  This has been a frequent occurrence for her this year.  She has undergone multiple cardioversions and was also hospitalized 2 weeks ago for increased amiodarone loading.  At time of discharge on 4/28, she was advised to continue increased dose of amiodarone for 2 weeks and then drop back to 200 once a day.  She has dropped back on her amiodarone.  On arrival in the ED, she is alert and oriented.  EKG confirms atrial fibrillation with RVR.  Blood pressures are on the elevated side.  Laboratory workup  was initiated.  Patient is agreeable to cardioversion.  This was discussed with cardiology.  Cardiology is agreeable with plan.  They also recommend increasing her amiodarone dose back to 200 mg twice daily.  In preparation for sedation, patient was ordered a light infusion of LR 50 cc/h.  This was unfortunately given to her as a 1 L bolus.  She underwent sedation and cardioversion as detailed above.  She did have conversion to normal sinus rhythm.  Unfortunately, she then went back into atrial fibrillation.  Dose of amiodarone was given.  Patient is currently overdue for her afternoon dose of 24-hour diltiazem which was ordered as well.  Recurrence of atrial fibrillation may be secondary to the bolus of IV fluids she received.  Dose of Lasix was ordered.  I have further question with cardiologist on-call, Dr. Jayme Cloud, who recommended retrial of cardioversion with 150 cc IV loading dose of amiodarone this was ordered.  Patient underwent repeat sedation and cardioversion.  She again returned to normal sinus rhythm.  This was sustained, as were the resolution of her symptoms.  Patient was advised to resume twice daily amiodarone and to come back to the ED for any return of concerning symptoms.  She was discharged in stable condition. I ordered medication including etomidate for sedation; amiodarone and diltiazem for recurrence of atrial fibrillation; Lasix for diuresis Reevaluation of the patient after these medicines showed that the patient stayed the same I have reviewed the patients home medicines and  have made adjustments as needed   Social Determinants of Health:  Has access to outpatient care  CRITICAL CARE Performed by: Gloris Manchester   Total critical care time: 35 minutes  Critical care time was exclusive of separately billable procedures and treating other patients.  Critical care was necessary to treat or prevent imminent or life-threatening deterioration.  Critical care was time spent  personally by me on the following activities: development of treatment plan with patient and/or surrogate as well as nursing, discussions with consultants, evaluation of patient's response to treatment, examination of patient, obtaining history from patient or surrogate, ordering and performing treatments and interventions, ordering and review of laboratory studies, ordering and review of radiographic studies, pulse oximetry and re-evaluation of patient's condition.         Final Clinical Impression(s) / ED Diagnoses Final diagnoses:  Atrial fibrillation with RVR [facility])    Rx / DC Orders ED Discharge Orders     None         Gloris Manchester, MD 09/01/22 2028

## 2022-09-01 NOTE — Progress Notes (Signed)
RT present for conscious sedation.  End-tidal CO2 placed on patient prior to procedure.  Patient's sats and vtials remained within normal limits during procedure.  No complications.

## 2022-09-04 NOTE — Pre-Procedure Instructions (Signed)
Instructed patient on the following items: Arrival time 0830 Nothing to eat or drink after midnight No meds AM of procedure Responsible person to drive you home and stay with you for 24 hrs  Have you missed any doses of anti-coagulant Eliquis- takes twice a day, hasn't missed any doses.  Don't take dose in the morning.   

## 2022-09-05 ENCOUNTER — Ambulatory Visit (HOSPITAL_BASED_OUTPATIENT_CLINIC_OR_DEPARTMENT_OTHER): Payer: Medicare Other | Admitting: Anesthesiology

## 2022-09-05 ENCOUNTER — Encounter (HOSPITAL_COMMUNITY): Payer: Self-pay | Admitting: Cardiovascular Disease

## 2022-09-05 ENCOUNTER — Ambulatory Visit (HOSPITAL_COMMUNITY): Payer: Medicare Other | Admitting: Anesthesiology

## 2022-09-05 ENCOUNTER — Other Ambulatory Visit: Payer: Self-pay

## 2022-09-05 ENCOUNTER — Ambulatory Visit (HOSPITAL_COMMUNITY)
Admission: RE | Admit: 2022-09-05 | Discharge: 2022-09-05 | Disposition: A | Discharge status: Home / Self Care | Payer: Medicare Other | Attending: Cardiovascular Disease | Admitting: Cardiovascular Disease

## 2022-09-05 ENCOUNTER — Ambulatory Visit (HOSPITAL_COMMUNITY)
Admission: RE | Disposition: A | Discharge status: Home / Self Care | Payer: Self-pay | Source: Home / Self Care | Attending: Cardiovascular Disease

## 2022-09-05 DIAGNOSIS — I4891 Unspecified atrial fibrillation: Secondary | ICD-10-CM

## 2022-09-05 DIAGNOSIS — Z7901 Long term (current) use of anticoagulants: Secondary | ICD-10-CM | POA: Diagnosis not present

## 2022-09-05 DIAGNOSIS — I4819 Other persistent atrial fibrillation: Secondary | ICD-10-CM | POA: Insufficient documentation

## 2022-09-05 DIAGNOSIS — Z8249 Family history of ischemic heart disease and other diseases of the circulatory system: Secondary | ICD-10-CM | POA: Diagnosis not present

## 2022-09-05 DIAGNOSIS — D6869 Other thrombophilia: Secondary | ICD-10-CM | POA: Insufficient documentation

## 2022-09-05 DIAGNOSIS — D649 Anemia, unspecified: Secondary | ICD-10-CM | POA: Insufficient documentation

## 2022-09-05 DIAGNOSIS — I4892 Unspecified atrial flutter: Secondary | ICD-10-CM | POA: Insufficient documentation

## 2022-09-05 HISTORY — PX: LOOP RECORDER REMOVAL: EP1215

## 2022-09-05 HISTORY — PX: ATRIAL FIBRILLATION ABLATION: EP1191

## 2022-09-05 SURGERY — ATRIAL FIBRILLATION ABLATION
Anesthesia: General

## 2022-09-05 MED ORDER — PROPOFOL 500 MG/50ML IV EMUL
INTRAVENOUS | Status: DC | PRN
  Administered 2022-09-05 (×2): 125 ug/kg/min via INTRAVENOUS

## 2022-09-05 MED ORDER — HEPARIN SODIUM (PORCINE) 1000 UNIT/ML IJ SOLN
INTRAMUSCULAR | Status: AC
  Filled 2022-09-05: qty 10

## 2022-09-05 MED ORDER — SUGAMMADEX SODIUM 200 MG/2ML IV SOLN
INTRAVENOUS | Status: DC | PRN
  Administered 2022-09-05: 180 mg via INTRAVENOUS

## 2022-09-05 MED ORDER — PROPOFOL 10 MG/ML IV BOLUS
INTRAVENOUS | Status: DC | PRN
  Administered 2022-09-05: 120 mg via INTRAVENOUS
  Administered 2022-09-05: 40 mg via INTRAVENOUS

## 2022-09-05 MED ORDER — PHENYLEPHRINE 80 MCG/ML (10ML) SYRINGE FOR IV PUSH (FOR BLOOD PRESSURE SUPPORT)
PREFILLED_SYRINGE | INTRAVENOUS | Status: DC | PRN
  Administered 2022-09-05: 80 ug via INTRAVENOUS

## 2022-09-05 MED ORDER — LIDOCAINE HCL (PF) 1 % IJ SOLN: INTRAMUSCULAR | Status: DC | PRN

## 2022-09-05 MED ORDER — PHENYLEPHRINE HCL-NACL 20-0.9 MG/250ML-% IV SOLN
INTRAVENOUS | Status: DC | PRN
  Administered 2022-09-05: 25 ug/min via INTRAVENOUS

## 2022-09-05 MED ORDER — SODIUM CHLORIDE 0.9 % IV SOLN: INTRAVENOUS | Status: DC

## 2022-09-05 MED ORDER — SODIUM CHLORIDE 0.9% FLUSH: 3.0000 mL | INTRAVENOUS | Status: DC | PRN

## 2022-09-05 MED ORDER — SODIUM CHLORIDE 0.9 % IV SOLN: 250.0000 mL | INTRAVENOUS | Status: DC | PRN

## 2022-09-05 MED ORDER — ONDANSETRON HCL 4 MG/2ML IJ SOLN: 4.0000 mg | Freq: Four times a day (QID) | INTRAMUSCULAR | Status: DC | PRN

## 2022-09-05 MED ORDER — HEPARIN SODIUM (PORCINE) 1000 UNIT/ML IJ SOLN
INTRAMUSCULAR | Status: DC | PRN
  Administered 2022-09-05: 3000 [IU] via INTRAVENOUS
  Administered 2022-09-05: 16000 [IU] via INTRAVENOUS

## 2022-09-05 MED ORDER — DOBUTAMINE INFUSION FOR EP/ECHO/NUC (1000 MCG/ML)
INTRAVENOUS | Status: AC
  Filled 2022-09-05: qty 250

## 2022-09-05 MED ORDER — FENTANYL CITRATE (PF) 250 MCG/5ML IJ SOLN
INTRAMUSCULAR | Status: DC | PRN
  Administered 2022-09-05: 50 ug via INTRAVENOUS

## 2022-09-05 MED ORDER — SODIUM CHLORIDE 0.9% FLUSH: 3.0000 mL | Freq: Two times a day (BID) | INTRAVENOUS | Status: DC

## 2022-09-05 MED ORDER — LIDOCAINE-EPINEPHRINE 1 %-1:100000 IJ SOLN
INTRAMUSCULAR | Status: AC
  Filled 2022-09-05: qty 1

## 2022-09-05 MED ORDER — HEPARIN SODIUM (PORCINE) 1000 UNIT/ML IJ SOLN
INTRAMUSCULAR | Status: DC | PRN
  Administered 2022-09-05: 1000 [IU] via INTRAVENOUS

## 2022-09-05 MED ORDER — ONDANSETRON HCL 4 MG/2ML IJ SOLN
INTRAMUSCULAR | Status: DC | PRN
  Administered 2022-09-05: 4 mg via INTRAVENOUS

## 2022-09-05 MED ORDER — LIDOCAINE-EPINEPHRINE 1 %-1:100000 IJ SOLN
INTRAMUSCULAR | Status: DC | PRN
  Administered 2022-09-05: 10 mL

## 2022-09-05 MED ORDER — ACETAMINOPHEN 325 MG PO TABS
650.0000 mg | ORAL_TABLET | ORAL | Status: DC | PRN
  Administered 2022-09-05: 650 mg via ORAL
  Filled 2022-09-05: qty 2

## 2022-09-05 MED ORDER — PROTAMINE SULFATE 10 MG/ML IV SOLN
INTRAVENOUS | Status: DC | PRN
  Administered 2022-09-05: 50 mg via INTRAVENOUS

## 2022-09-05 MED ORDER — HEPARIN (PORCINE) IN NACL 1000-0.9 UT/500ML-% IV SOLN
INTRAVENOUS | Status: DC | PRN
  Administered 2022-09-05 (×4): 500 mL

## 2022-09-05 MED ORDER — ROCURONIUM BROMIDE 10 MG/ML (PF) SYRINGE
PREFILLED_SYRINGE | INTRAVENOUS | Status: DC | PRN
  Administered 2022-09-05: 60 mg via INTRAVENOUS
  Administered 2022-09-05: 10 mg via INTRAVENOUS

## 2022-09-05 SURGICAL SUPPLY — 21 items
BLANKET WARM UNDERBOD FULL ACC (MISCELLANEOUS) ×1 IMPLANT
CATH 8FR REPROCESSED SOUNDSTAR (CATHETERS) ×1 IMPLANT
CATH 8FR SOUNDSTAR REPROCESSED (CATHETERS) IMPLANT
CATH ABLAT QDOT MICRO BI TC DF (CATHETERS) IMPLANT
CATH OCTARAY 2.0 F 3-3-3-3-3 (CATHETERS) IMPLANT
CATH PIGTAIL STEERABLE D1 8.7 (WIRE) IMPLANT
CATH S-M CIRCA TEMP PROBE (CATHETERS) IMPLANT
CATH WEBSTER BI DIR CS D-F CRV (CATHETERS) IMPLANT
CLOSURE PERCLOSE PROSTYLE (VASCULAR PRODUCTS) IMPLANT
COVER SWIFTLINK CONNECTOR (BAG) ×1 IMPLANT
DEVICE CLOSURE MYNXGRIP 6/7F (Vascular Products) IMPLANT
PACK EP LATEX FREE (CUSTOM PROCEDURE TRAY) ×1
PACK EP LF (CUSTOM PROCEDURE TRAY) ×1 IMPLANT
PACK LOOP INSERTION (CUSTOM PROCEDURE TRAY) ×1 IMPLANT
PAD DEFIB RADIO PHYSIO CONN (PAD) ×1 IMPLANT
PATCH CARTO3 (PAD) IMPLANT
SHEATH CARTO VIZIGO MED CURVE (SHEATH) IMPLANT
SHEATH PINNACLE 8F 10CM (SHEATH) IMPLANT
SHEATH PINNACLE 9F 10CM (SHEATH) IMPLANT
SHEATH PROBE COVER 6X72 (BAG) IMPLANT
TUBING SMART ABLATE COOLFLOW (TUBING) IMPLANT

## 2022-09-05 NOTE — Discharge Instructions (Addendum)
Cardiac Ablation, Care After  This sheet gives you information about how to care for yourself after your procedure. Your health care provider may also give you more specific instructions. If you have problems or questions, contact your health care provider. What can I expect after the procedure? After the procedure, it is common to have: Bruising around your puncture site. Tenderness around your puncture site. Skipped heartbeats. If you had an atrial fibrillation ablation, you may have atrial fibrillation during the first several months after your procedure.  Tiredness (fatigue).  Follow these instructions at home: Puncture site care  Follow instructions from your health care provider about how to take care of your puncture site. Make sure you: If present, leave stitches (sutures), skin glue, or adhesive strips in place. These skin closures may need to stay in place for up to 2 weeks. If adhesive strip edges start to loosen and curl up, you may trim the loose edges. Do not remove adhesive strips completely unless your health care provider tells you to do that. If a large square bandage is present, this may be removed 24 hours after surgery.  Check your puncture site every day for signs of infection. Check for: Redness, swelling, or pain. Fluid or blood. If your puncture site starts to bleed, lie down on your back, apply firm pressure to the area, and contact your health care provider. Warmth. Pus or a bad smell. A pea or small marble sized lump at the site is normal and can take up to three months to resolve.  Driving Do not drive for at least 4 days after your procedure or however long your health care provider recommends. (Do not resume driving if you have previously been instructed not to drive for other health reasons.) Do not drive or use heavy machinery while taking prescription pain medicine. Activity Avoid activities that take a lot of effort for at least 7 days after your  procedure. Do not lift anything that is heavier than 5 lb (4.5 kg) for one week.  No sexual activity for 1 week.  Return to your normal activities as told by your health care provider. Ask your health care provider what activities are safe for you. General instructions Take over-the-counter and prescription medicines only as told by your health care provider. Do not use any products that contain nicotine or tobacco, such as cigarettes and e-cigarettes. If you need help quitting, ask your health care provider. You may shower after 24 hours, but Do not take baths, swim, or use a hot tub for 1 week.  Do not drink alcohol for 24 hours after your procedure. Keep all follow-up visits as told by your health care provider. This is important. Contact a health care provider if: You have redness, mild swelling, or pain around your puncture site. You have fluid or blood coming from your puncture site that stops after applying firm pressure to the area. Your puncture site feels warm to the touch. You have pus or a bad smell coming from your puncture site. You have a fever. You have chest pain or discomfort that spreads to your neck, jaw, or arm. You have chest pain that is worse with lying on your back or taking a deep breath. You are sweating a lot. You feel nauseous. You have a fast or irregular heartbeat. You have shortness of breath. You are dizzy or light-headed and feel the need to lie down. You have pain or numbness in the arm or leg closest to your puncture   site. Get help right away if: Your puncture site suddenly swells. Your puncture site is bleeding and the bleeding does not stop after applying firm pressure to the area. These symptoms may represent a serious problem that is an emergency. Do not wait to see if the symptoms will go away. Get medical help right away. Call your local emergency services (911 in the [place] Do not drive yourself to the hospital. Summary After the procedure, it  is normal to have bruising and tenderness at the puncture site in your groin, neck, or forearm. Check your puncture site every day for signs of infection. Get help right away if your puncture site is bleeding and the bleeding does not stop after applying firm pressure to the area. This is a medical emergency. This information is not intended to replace advice given to you by your health care provider. Make sure you discuss any questions you have with your health care provider.  You have an appointment set up with the [facility].  Multiple studies have shown that being followed by a dedicated atrial fibrillation clinic in addition to the standard care you receive from your other physicians improves health. We believe that enrollment in the atrial fibrillation clinic will allow [place] to better care for you.   The phone number to the [facility] is [phone]. The clinic is staffed Monday through Friday from 8:30am to 5pm.  Directions: The clinic is located in the [facility], 6TH FLOOR Enter the hospital at the MAIN ENTRANCE "A", use [employer] to the 6th floor.  Registration desk to the right of elevators on 6th floor  If you have any trouble locating the clinic, please don't hesitate to call [phone].   L Chest Dressing  This sheet gives you information about how to care for yourself after your procedure. Your health care provider may also give you more specific instructions. If you have problems or questions, contact your health care provider. What can I expect after the procedure? After your procedure, it is common to have: Pain or soreness at the site where the cardiac device was taken out. Swelling at the site where the cardiac device was taken out. Follow these instructions at home: Incision care  Keep the incision clean and dry. Do not take baths, swim, or use a hot tub until after your wound check.  Do not shower for at least 7  days, or as directed by your health care provider. Pat the area dry with a clean towel. Do not rub the area. This may cause bleeding. Follow instructions from your health care provider about how to take care of your incision. Make sure you: Leave adhesive strips in place. These skin closures may need to stay in place for 2 weeks or longer. If adhesive strip edges start to loosen and curl up, you may trim the loose edges. Do not remove adhesive strips completely unless your health care provider tells you to do that. Check your incision area every day for signs of infection. Check for: More redness, swelling, or pain. More fluid or blood. Warmth. Pus or a bad smell. Activity Do not lift anything that is heavier than 10 lb (4.5 kg) until your health care provider says it is okay to do so. For the first week, or as long as told by your health care provider: After 1 week, Be gentle when you move your arms over your head. It is okay to raise your arm to comb your  hair. Avoid strenuous exercise. Ask your health care provider when it is okay to: Resume your normal activities. Return to work or school. Resume sexual activity. Eating and drinking Eat a heart-healthy diet. This should include plenty of fresh fruits and vegetables, whole grains, low-fat dairy products, and lean protein like chicken and fish. Limit alcohol intake to no more than 1 drink a day for non-pregnant women and 2 drinks a day for men. One drink equals 12 oz of beer, 5 oz of wine, or 1 oz of hard liquor. Check ingredients and nutrition facts on packaged foods and beverages. Avoid the following types of food: Food that is high in salt (sodium). Food that is high in saturated fat, like full-fat dairy or red meat. Food that is high in trans fat, like fried food. Food and drinks that are high in sugar. Lifestyle Do not use any products that contain nicotine or tobacco, such as cigarettes and e-cigarettes. If you need help quitting,  ask your health care provider. Take steps to manage and control your weight. Once cleared, get regular exercise. Aim for 150 minutes of moderate-intensity exercise (such as walking or yoga) or 75 minutes of vigorous exercise (such as running or swimming) each week. Manage other health problems, such as diabetes or high blood pressure. Ask your health care provider how you can manage these conditions. General instructions Do not drive for 24 hours after your procedure if you were given a medicine to help you relax (sedative). Take over-the-counter and prescription medicines only as told by your health care provider. Avoid putting pressure on the area where the cardiac device was taken out. Keep all follow-up visits as directed by your health care provider. This is important. Contact a health care provider if: You have pain at the incision site that is not relieved by over-the-counter or prescription medicines. You have any of these around your incision site or coming from it: More redness, swelling, or pain. Fluid or blood. Warmth to the touch. Pus or a bad smell. You have a fever. You feel brief, occasional palpitations, light-headedness, or any symptoms that you think might be related to your heart. Get help right away if: You experience chest pain that is different from the pain at the cardiac device site. You develop a red streak that extends above or below the incision site. You experience shortness of breath. You have palpitations or an irregular heartbeat. You have light-headedness that does not go away quickly. You faint or have dizzy spells. Your pulse suddenly drops or increases rapidly and does not return to normal. You begin to gain weight and your legs and ankles swell. Summary After your procedure, it is common to have pain, soreness, and some swelling where the cardiac device was taken out. Make sure to keep your incision clean and dry. Follow instructions from your health  care provider about how to take care of your incision. Check your incision every day for signs of infection, such as more pain or swelling, pus or a bad smell, warmth, or leaking fluid and blood. Avoid strenuous exercise and lifting your left arm higher than your shoulder for 2 weeks, or as long as told by your health care provider. This information is not intended to replace advice given to you by your health care provider. Make sure you discuss any questions you have with your health care provider.

## 2022-09-05 NOTE — Transfer of Care (Signed)
Immediate Anesthesia Transfer of Care Note  Patient: Nicole [name]  Procedure(s) Performed: ATRIAL FIBRILLATION ABLATION LOOP RECORDER REMOVAL  Patient Location: Cath Lab  Anesthesia Type:General  Level of Consciousness: awake, alert , and oriented  Airway & Oxygen Therapy: Patient Spontanous Breathing and Patient connected to nasal cannula oxygen  Post-op Assessment: Report given to RN and Post -op Vital signs reviewed and stable  Post vital signs: Reviewed and stable  Last Vitals:  Vitals Value Taken Time  BP 112/57 09/05/22 1225  Temp    Pulse 72 09/05/22 1227  Resp 19 09/05/22 1227  SpO2 92 % 09/05/22 1227  Vitals shown include unvalidated device data.  Last Pain:  Vitals:   09/05/22 0837  TempSrc: Temporal         Complications: No notable events documented.

## 2022-09-05 NOTE — Anesthesia Procedure Notes (Addendum)
Procedure Name: Intubation Date/Time: 09/05/2022 10:14 AM  Performed by: Randon Goldsmith, CRNAPre-anesthesia Checklist: Patient identified, Emergency Drugs available, Suction available and Patient being monitored Patient Re-evaluated:Patient Re-evaluated prior to induction Oxygen Delivery Method: Circle system utilized Preoxygenation: Pre-oxygenation with 100% oxygen Induction Type: IV induction and Cricoid Pressure applied Ventilation: Mask ventilation without difficulty Laryngoscope Size: Mac and 3 Grade View: Grade I Tube type: Oral Tube size: 7.0 mm Number of attempts: 1 Airway Equipment and Method: Stylet and Oral airway Placement Confirmation: ETT inserted through vocal cords under direct vision, positive ETCO2 and breath sounds checked- equal and bilateral Secured at: 22 cm Tube secured with: Tape Dental Injury: Teeth and Oropharynx as per pre-operative assessment

## 2022-09-05 NOTE — H&P (Signed)
Electrophysiology Office Note:    Date:  09/05/2022   ID:  Nicole [name], DOB 09-14-1950, MRN [id number]  PCP:  Jearld Lesch, MD   [facility] Providers Cardiologist:  None Electrophysiologist:  Maurice Small, MD     Referring MD: No ref. provider found   History of Present Illness:    Nicole [name] is a 72 y.o. adult with a hx listed below, significant for atrial fibrillation s/p ablation and Tikosyn, referred for arrhythmia management.  She underwent a repeat AF ablation by Dr. Johney Frame in Nov 2021. 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 I reviewed the ECGs from the recent visits. They do appear to show AF rather than an atypical flutter.  She is very hesitant to consider a convergent procedure.  Since her last visit with me, she had recurrence of rapid atrial fibrillation resulting in ER visit with DC cardioversion in February 2024.  She was loaded with IV amiodarone and had another DC cardioversion on fibber 29th.  Today, she reports that she is feeling well and has no acute complaints - dyspnea, chest pain, syncope, palpitations.  I reviewed the patient's CT and labs. There was no LAA thrombus. she  has not missed any doses of anticoagulation, and she took her dose last night. There have been no changes in the patient's diagnoses, medications, or condition since our recent clinic visit.  Patient was informed about nodules on her CT and recommended follow-up CT in 6 months.  Past Medical History:  Diagnosis Date   Anticoagulant long-term use 11/16/2014   Anxiety 10/06/2017   Arthritis 10/06/2017   knees bilateral   Asthma 11/16/2014   Esophageal reflux 11/16/2014   Essential hypertension 08/15/2015   pt denies   H/O: GI bleed 12/11/2016    Hyperlipidemia 11/17/2014   Migraine headache 11/16/2014   Nausea 11/16/2014   Pain in limb 11/16/2014   Panic disorder 11/16/2014   Paroxysmal atrial fibrillation (HCC) 11/17/2014   Swelling of joint 11/16/2014   Symptomatic anemia 11/24/2016   Tendonitis 11/16/2014   UTI (urinary tract infection) 12/19/2015    Past Surgical History:  Procedure Laterality Date   ABDOMINAL HYSTERECTOMY     ATRIAL FIBRILLATION ABLATION N/A 03/14/2020   Procedure: ATRIAL FIBRILLATION ABLATION;  Surgeon: Hillis Range, MD;  Location: [facility] INVASIVE CV LAB;  Service: Cardiovascular;  Laterality: N/A;   BIOPSY  11/22/2021   Procedure: BIOPSY;  Surgeon: Jenel Lucks, MD;  Location: [facility] ENDOSCOPY;  Service: Gastroenterology;;   CARDIOVERSION     CARDIOVERSION N/A 06/21/2022   Procedure: CARDIOVERSION;  Surgeon: Chrystie Nose, MD;  Location: [facility] ENDOSCOPY;  Service: Cardiovascular;  Laterality: N/A;   CESAREAN SECTION     COLONOSCOPY     COLONOSCOPY N/A 11/22/2021   Procedure: COLONOSCOPY;  Surgeon: Jenel Lucks, MD;  Location: [facility] ENDOSCOPY;  Service: Gastroenterology;  Laterality: N/A;   ESOPHAGOGASTRODUODENOSCOPY (EGD) WITH PROPOFOL N/A 11/22/2021   Procedure: ESOPHAGOGASTRODUODENOSCOPY (EGD) WITH PROPOFOL;  Surgeon: Jenel Lucks, MD;  Location: [facility] ENDOSCOPY;  Service: Gastroenterology;  Laterality: N/A;   GIVENS CAPSULE STUDY N/A 11/22/2021   Procedure: GIVENS CAPSULE STUDY;  Surgeon: Jenel Lucks, MD;  Location: [facility] ENDOSCOPY;  Service: Gastroenterology;  Laterality: N/A;   LOOP RECORDER INSERTION N/A 02/04/2018   Procedure: LOOP RECORDER  INSERTION;  Surgeon: Hillis Range, MD;  Location: [facility] INVASIVE CV LAB;  Service: Cardiovascular;  Laterality: N/A;   POLYPECTOMY  11/22/2021   Procedure: POLYPECTOMY;  Surgeon: Jenel Lucks, MD;  Location: [facility] ENDOSCOPY;  Service: Gastroenterology;;   ROTATOR CUFF REPAIR Left     Current Medications: Current Meds  Medication Sig   acetaminophen (TYLENOL)  500 MG tablet Take 500 mg by mouth 2 (two) times daily as needed for mild pain, headache, moderate pain or fever.   amiodarone (PACERONE) 200 MG tablet Take 1 tablet (200 mg total) by mouth 2 (two) times daily for 14 days, THEN 1 tablet (200 mg total) daily.   apixaban (ELIQUIS) 5 MG TABS tablet Take 1 tablet (5 mg total) by mouth 2 (two) times daily.   Butalbital-APAP-Caffeine 50-300-40 MG CAPS Take 1 capsule by mouth 3 (three) times daily as needed (migraine).   butorphanol (STADOL) 10 MG/ML nasal spray Place 1 spray into the nose every 4 (four) hours as needed for headache or migraine.    clonazePAM (KLONOPIN) 1 MG tablet Take 1 mg by mouth 3 (three) times daily.   diltiazem (CARDIZEM CD) 360 MG 24 hr capsule Take 1 capsule (360 mg total) by mouth daily.   diltiazem (CARDIZEM) 30 MG tablet Take 1 Tablet Every 4 Hours As Needed For HR >100 (Patient taking differently: Take 30 mg by mouth every 4 (four) hours as needed (For Heart Rate over 100).)   ferrous sulfate 324 MG TBEC Take 1 tablet (324 mg total) by mouth daily.   pravastatin (PRAVACHOL) 40 MG tablet Take 40 mg by mouth at bedtime.    promethazine (PHENERGAN) 25 MG tablet Take 25 mg by mouth every 6 (six) hours as needed for nausea or vomiting.   sertraline (ZOLOFT) 50 MG tablet Take 50 mg by mouth at bedtime.     Allergies:   Demerol [meperidine hcl], Pneumococcal vaccines, Shingrix [zoster vac recomb adjuvanted], Other, and Prednisone   Social History   Socioeconomic History   Marital status: Married    Spouse name: Not on file   Number of children: Not on file   Years of education: Not on file   Highest education level: Not on file  Occupational History   Not on file  Tobacco Use   Smoking status: Never   Smokeless tobacco: Never   Tobacco comments:    Never smoke 07/26/21  Vaping Use   Vaping Use: Never used  Substance and Sexual Activity   Alcohol use: Never   Drug use: Never   Sexual activity: Not on file  Other  Topics Concern   Not on file  Social History Narrative   Lives in [place] with spouse   Retired from school system.   Social Determinants of Health   Financial Resource Strain: Not on file  Food Insecurity: No Food Insecurity (08/17/2022)   Hunger Vital Sign    Worried About Running Out of Food in the Last Year: Never true    Ran Out of Food in the Last Year: Never true  Transportation Needs: No Transportation Needs (08/17/2022)   PRAPARE - Administrator, Civil Service (Medical): No    Lack of Transportation (Non-Medical): No  Physical Activity: Not on file  Stress: Not on file  Social Connections: Not on file     Family History: The patient's family history includes Heart attack in her mother; Heart disease in her mother, sister, sister, sister, sister, and sister.  ROS:   Please  see the history of present illness.    All other systems reviewed and are negative.  EKGs/Labs/Other Studies Reviewed Today:      EKG:  Last EKG results: today - sinus rhythm   Recent Labs: 05/13/2022: B Natriuretic Peptide 237.1 09/01/2022: ALT 29; BUN 10; Creatinine, Ser 1.07; Hemoglobin 11.8; Magnesium 2.2; Platelets 302; Potassium 3.7; Sodium 137; TSH 2.570     Physical Exam:    VS:  BP (!) 165/98   Pulse (!) 122   Temp 97.9 F (36.6 C) (Temporal)   Resp 20   Ht 5\' 8"  (1.727 m)   Wt 90.3 kg   SpO2 97%   BMI 30.26 kg/m     Wt Readings from Last 3 Encounters:  09/05/22 90.3 kg  08/18/22 93 kg  08/06/22 91.8 kg     GEN: Well nourished, well developed in no acute distress CARDIAC: RRR, no murmurs, rubs, gallops RESPIRATORY:  Normal work of breathing MUSCULOSKELETAL: no edema    ASSESSMENT & PLAN:    Atrial fibrillation: s/p ablation x2 and Tikosyn with recurrences, symptomatic with with RVR. She expresses a strong preference for rhythm control. She presents today for atrial mapping and possible AF ablation. If her prior ablation set looks complete, we will  conclude the case and discuss scheduling pacemaker placement and AVJ ablation. She understands and is eager to proceed.  ILR in placed, at RRT. We can remove her loop recorder at the time of her ablation, per her request. Secondary hypercoagulable state:  continue anticoagulation Anemia: she has not had any dark stools. She has been on supplementation in the past, but discontinued it fairly recently.       -Repeat CBC today       -Resume iron tablets        Medication Adjustments/Labs and Tests Ordered: Current medicines are reviewed at length with the patient today.  Concerns regarding medicines are outlined above.  Orders Placed This Encounter  Procedures   Informed Consent Details: Physician/Practitioner Attestation; Transcribe to consent form and obtain patient signature   Informed Consent Details: Physician/Practitioner Attestation; Transcribe to consent form and obtain patient signature   Initiate Pre-op Protocol   Void on call to EP Lab   Confirm CBC and BMP (or CMP) results within 7 days for inpatient and 30 days for outpatient:   Clip right and left femoral area PM before surgery   Clip right internal jugular area PM before surgery   Pre-admission testing diagnosis   EP STUDY   EP PPM/ICD IMPLANT   Insert peripheral IV   Meds ordered this encounter  Medications   0.9 %  sodium chloride infusion     Signed, Maurice Small, MD  09/05/2022 9:50 AM    [facility]

## 2022-09-06 ENCOUNTER — Encounter (HOSPITAL_COMMUNITY): Payer: Self-pay | Admitting: Cardiovascular Disease

## 2022-09-06 LAB — POCT ACTIVATED CLOTTING TIME
Activated Clotting Time: 255 seconds
Activated Clotting Time: 255 seconds

## 2022-09-06 MED FILL — Dobutamine in Dextrose 5% Inj 4 MG/ML: INTRAVENOUS | Qty: 250 | Status: AC

## 2022-09-06 NOTE — Anesthesia Postprocedure Evaluation (Signed)
Anesthesia Post Note  Patient: Nicole [name]  Procedure(s) Performed: ATRIAL FIBRILLATION ABLATION LOOP RECORDER REMOVAL     Patient location during evaluation: Cath Lab Anesthesia Type: General Level of consciousness: awake and alert Pain management: pain level controlled Vital Signs Assessment: post-procedure vital signs reviewed and stable Respiratory status: spontaneous breathing, nonlabored ventilation and respiratory function stable Cardiovascular status: blood pressure returned to baseline and stable Postop Assessment: no apparent nausea or vomiting Anesthetic complications: no   No notable events documented.  Last Vitals:  Vitals:   09/05/22 1500 09/05/22 1530  BP: 128/84 (!) 134/98  Pulse: 93 100  Resp: (!) 21 20  Temp:    SpO2: 94% 96%    Last Pain:  Vitals:   09/06/22 1042  TempSrc:   PainSc: 0-No pain                 Laurna Shetley

## 2022-09-06 NOTE — Anesthesia Preprocedure Evaluation (Signed)
Anesthesia Evaluation  Patient identified by MRN, date of birth, ID band Patient awake    Reviewed: Allergy & Precautions, NPO status , Patient's Chart, lab work & pertinent test results, reviewed documented beta blocker date and time   History of Anesthesia Complications Negative for: history of anesthetic complications  Airway Mallampati: III  TM Distance: >3 FB Neck ROM: Full    Dental  (+) Dental Advisory Given, Poor Dentition, Missing, Loose,    Pulmonary neg shortness of breath, asthma , neg sleep apnea, neg COPD, neg recent URI Covid-19 Nucleic Acid Test Results Lab Results      Component                Value               Date                      SARSCOV2NAA              NEGATIVE            03/13/2020              breath sounds clear to auscultation       Cardiovascular hypertension, Pt. on medications (-) angina + dysrhythmias Atrial Fibrillation  Rhythm:Irregular  1. Left ventricular ejection fraction, by estimation, is 60 to 65%. The  left ventricle has normal function. The left ventricle has no regional  wall motion abnormalities. Left ventricular diastolic parameters are  consistent with Grade II diastolic  dysfunction (pseudonormalization).   2. Right ventricular systolic function is normal. The right ventricular  size is normal. There is normal pulmonary artery systolic pressure. The  estimated right ventricular systolic pressure is 19.3 mmHg.   3. Left atrial size was mildly dilated.   4. The mitral valve is normal in structure. Mild to moderate mitral valve  regurgitation. No evidence of mitral stenosis.   5. The aortic valve is tricuspid. Aortic valve regurgitation is trivial.  No aortic stenosis is present.   6. Aortic dilatation noted. There is mild dilatation of the aortic root,  measuring 38 mm.   7. The inferior vena cava is normal in size with greater than 50%  respiratory variability, suggesting right  atrial pressure of 3 mmHg.   Loop recorder in place   Neuro/Psych  Headaches PSYCHIATRIC DISORDERS Anxiety        GI/Hepatic Neg liver ROS,GERD  Controlled,,  Endo/Other  negative endocrine ROS    Renal/GU negative Renal ROSLab Results      Component                Value               Date                      CREATININE               0.86                02/29/2020           Lab Results      Component                Value               Date                      K  4.0                 02/29/2020                Musculoskeletal  (+) Arthritis ,    Abdominal   Peds  Hematology  (+) Blood dyscrasia Eliquis for afib   Anesthesia Other Findings   Reproductive/Obstetrics                              Anesthesia Physical Anesthesia Plan  ASA: II  Anesthesia Plan: General   Post-op Pain Management: Minimal or no pain anticipated   Induction: Intravenous  PONV Risk Score and Plan: 3 and Ondansetron, Dexamethasone, Propofol infusion and TIVA  Airway Management Planned: Oral ETT  Additional Equipment: None  Intra-op Plan:   Post-operative Plan: Extubation in OR  Informed Consent: I have reviewed the patients History and Physical, chart, labs and discussed the procedure including the risks, benefits and alternatives for the proposed anesthesia with the patient or authorized representative who has indicated his/her understanding and acceptance.     Dental advisory given  Plan Discussed with: CRNA and Surgeon  Anesthesia Plan Comments:          Anesthesia Quick Evaluation

## 2022-09-10 ENCOUNTER — Telehealth: Payer: Self-pay | Admitting: Cardiovascular Disease

## 2022-09-10 NOTE — Telephone Encounter (Signed)
Returned call to patient. She had a loop recorder explant and AF ablation on 09/05/22. She states she was told by Dr. Nelly Laurence to not get surgical tape on incision wet and if it hasn't come off in about 7 days to call and have his nurse remove the tape.  Discussed with patient post-care instructions: Follow instructions from your health care provider about how to take care of your incision. Make sure you: Leave adhesive strips in place. These skin closures may need to stay in place for 2 weeks or longer. If adhesive strip edges start to loosen and curl up, you may trim the loose edges. Do not remove adhesive strips completely unless your health care provider tells you to do that.  She states she is not concerned but wanted to follow the instructions given to her by Dr. Nelly Laurence regarding having nurse remove surgical tape if it has not come off within 7 days following procedure.  Will forward to Dr. Morrie Sheldon nurse to follow-up on.

## 2022-09-10 NOTE — Telephone Encounter (Signed)
Patient is calling because she would like to know if she needs to come by to have the nurse take off her surgical tape. Please advise.

## 2022-09-10 NOTE — Telephone Encounter (Signed)
Spoke with patient about ILR explant site, patient made aware to leave steristrips in place for 10 days. Instructed she can remove them on 5/27. Patient states the groin sites look remarkable, no concerns. No further questions at this time.

## 2022-09-15 ENCOUNTER — Other Ambulatory Visit (HOSPITAL_COMMUNITY): Payer: Self-pay | Admitting: Cardiovascular Disease

## 2022-09-17 ENCOUNTER — Encounter: Payer: Self-pay | Admitting: Cardiovascular Disease

## 2022-09-17 ENCOUNTER — Ambulatory Visit: Payer: Medicare Other | Attending: Cardiovascular Disease | Admitting: Cardiovascular Disease

## 2022-09-17 VITALS — BP 144/82 | HR 69 | Ht 68.0 in | Wt 199.2 lb

## 2022-09-17 DIAGNOSIS — I480 Paroxysmal atrial fibrillation: Secondary | ICD-10-CM | POA: Diagnosis not present

## 2022-09-17 DIAGNOSIS — I4891 Unspecified atrial fibrillation: Secondary | ICD-10-CM

## 2022-09-17 MED ORDER — POTASSIUM CHLORIDE CRYS ER 20 MEQ PO TBCR
20.0000 meq | EXTENDED_RELEASE_TABLET | Freq: Every day | ORAL | 3 refills | Status: DC | PRN
Start: 2022-09-17 — End: 2022-12-16

## 2022-09-17 MED ORDER — AMIODARONE HCL 200 MG PO TABS
200.0000 mg | ORAL_TABLET | Freq: Every day | ORAL | 3 refills | Status: AC
Start: 2022-09-17 — End: ?

## 2022-09-17 NOTE — Progress Notes (Signed)
Electrophysiology Office Note:    Date:  09/17/2022   ID:  Nicole [name], DOB [birth year], MRN [id number]  PCP:  Jearld Lesch, MD   [facility] Providers Cardiologist:  None Electrophysiologist:  Maurice Small, MD     Referring MD: Jearld Lesch, MD   History of Present Illness:    Nicole [name] is a 72 y.o. adult with a hx listed below, significant for atrial fibrillation s/p ablation and Tikosyn, referred for arrhythmia management.  She underwent a repeat AF ablation by Dr. Johney Frame in Nov 2021. 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 I reviewed the ECGs from the recent visits. They do appear to show AF rather than an atypical flutter.  She is very hesitant to consider a convergent procedure.  She continued to have frequent episodes of atrial fibrillation and flutter, a few requiring ER visits. She was switched from Tikosyn to amiodarone. She underwent a third ablation on Sep 05, 2022. Only a small amount of touch-up was required to re-isolate the pulmonary veins. During the case, she converted to an atypical flutter that converted with ablation of a line extending from the RSPV to the mitral annulus. However, she has recurrence of multiple atrial arrhythmias with variable cycle length and activation sequences.  Her loop recorder was removed.  Since the ablation, she has been doing well and has not had any symptoms to suggest recurrence of atrial fibrillation.  Today, she reports that she is feeling well and has no acute complaints - dyspnea, chest pain, syncope, palpitations.    Past Medical History:  Diagnosis Date   Anticoagulant long-term use 11/16/2014   Anxiety 10/06/2017   Arthritis 10/06/2017   knees bilateral   Asthma 11/16/2014    Esophageal reflux 11/16/2014   Essential hypertension 08/15/2015   pt denies   H/O: GI bleed 12/11/2016   Hyperlipidemia 11/17/2014   Migraine headache 11/16/2014   Nausea 11/16/2014   Pain in limb 11/16/2014   Panic disorder 11/16/2014   Paroxysmal atrial fibrillation (HCC) 11/17/2014   Swelling of joint 11/16/2014   Symptomatic anemia 11/24/2016   Tendonitis 11/16/2014   UTI (urinary tract infection) 12/19/2015    Past Surgical History:  Procedure Laterality Date   ABDOMINAL HYSTERECTOMY     ATRIAL FIBRILLATION ABLATION N/A 03/14/2020   Procedure: ATRIAL FIBRILLATION ABLATION;  Surgeon: Hillis Range, MD;  Location: [facility] IN[facility]IVE [facility];  Service: Cardiovascular;  Laterality: N/A;   ATRIAL FIBRILLATION ABLATION N/A 09/05/2022   Procedure: ATRIAL FIBRILLATION ABLATION;  Surgeon: Maurice Small, MD;  Location: [facility] CV [facility];  Service: Cardiovascular;  Laterality: N/A;   BIOPSY  11/22/2021   Procedure: BIOPSY;  Surgeon: Jenel Lucks, MD;  Location: [facility] ENDOSCOPY;  Service: Gastroenterology;;   CARDIOVERSION     CARDIOVERSION N/A 06/21/2022   Procedure: CARDIOVERSION;  Surgeon: Chrystie Nose, MD;  Location: [facility] ENDOSCOPY;  Service: Cardiovascular;  Laterality: N/A;   CESAREAN SECTION     COLONOSCOPY     COLONOSCOPY N/A 11/22/2021   Procedure: COLONOSCOPY;  Surgeon: Jenel Lucks, MD;  Location: [facility] ENDOSCOPY;  Service: Gastroenterology;  Laterality: N/A;   ESOPHAGOGASTRODUODENOSCOPY (EGD) WITH PROPOFOL N/A 11/22/2021   Procedure: ESOPHAGOGASTRODUODENOSCOPY (EGD) WITH PROPOFOL;  Surgeon: Jenel Lucks, MD;  Location: [facility] ENDOSCOPY;  Service: Gastroenterology;  Laterality: N/A;   GIVENS CAPSULE STUDY N/A 11/22/2021   Procedure: GIVENS CAPSULE STUDY;  Surgeon: Jenel Lucks, MD;  Location: [facility] ENDOSCOPY;  Service: Gastroenterology;  Laterality: N/A;   LOOP RECORDER INSERTION N/A 02/04/2018   Procedure: LOOP RECORDER INSERTION;  Surgeon: Hillis Range, MD;  Location: [facility] INVASIVE  CV LAB;  Service: Cardiovascular;  Laterality: N/A;   LOOP RECORDER REMOVAL N/A 09/05/2022   Procedure: LOOP RECORDER REMOVAL;  Surgeon: Maurice Small, MD;  Location: [facility] CV LAB;  Service: Cardiovascular;  Laterality: N/A;   POLYPECTOMY  11/22/2021   Procedure: POLYPECTOMY;  Surgeon: Jenel Lucks, MD;  Location: [facility] ENDOSCOPY;  Service: Gastroenterology;;   ROTATOR CUFF REPAIR Left     Current Medications: No outpatient medications have been marked as taking for the 09/17/22 encounter (Appointment) with Sherice Ijames, Roberts Gaudy, MD.     Allergies:   Demerol [meperidine hcl], Pneumococcal vaccines, Shingrix [zoster vac recomb adjuvanted], Other, and Prednisone   Social History   Socioeconomic History   Marital status: Married    Spouse name: Not on file   Number of children: Not on file   Years of education: Not on file   Highest education level: Not on file  Occupational History   Not on file  Tobacco Use   Smoking status: Never   Smokeless tobacco: Never   Tobacco comments:    Never smoke 07/26/21  Vaping Use   Vaping Use: Never used  Substance and Sexual Activity   Alcohol use: Never   Drug use: Never   Sexual activity: Not on file  Other Topics Concern   Not on file  Social History Narrative   Lives in [place] with spouse   Retired from school system.   Social Determinants of Health   Financial Resource Strain: Not on file  Food Insecurity: No Food Insecurity (08/17/2022)   Hunger Vital Sign    Worried About Running Out of Food in the Last Year: Never true    Ran Out of Food in the Last Year: Never true  Transportation Needs: No Transportation Needs (08/17/2022)   PRAPARE - Administrator, Civil Service (Medical): No    Lack of Transportation (Non-Medical): No  Physical Activity: Not on file  Stress: Not on file  Social Connections: Not on file     Family History: The patient's family history includes Heart attack in her mother; Heart  disease in her mother, sister, sister, sister, sister, and sister.  ROS:   Please see the history of present illness.    All other systems reviewed and are negative.  EKGs/Labs/Other Studies Reviewed Today:      EKG:  Last EKG results: today - sinus rhythm   Recent Labs: 05/13/2022: B Natriuretic Peptide 237.1 09/01/2022: ALT 29; BUN 10; Creatinine, Ser 1.07; Hemoglobin 11.8; Magnesium 2.2; Platelets 302; Potassium 3.7; Sodium 137; TSH 2.570     Physical Exam:    VS:  There were no vitals taken for this visit.    Wt Readings from Last 3 Encounters:  09/05/22 199 lb (90.3 kg)  08/18/22 205 lb 0.4 oz (93 kg)  08/06/22 202 lb 6.4 oz (91.8 kg)     GEN: Well nourished, well developed in no acute distress CARDIAC: RRR, no murmurs, rubs, gallops RESPIRATORY:  Normal work of breathing MUSCULOSKELETAL: no edema    ASSESSMENT & PLAN:    Atrial fibrillation: s/p ablation x3, now on amiodarone.          - Continue  amiodarone; reduce dose to 200mg  once daily          - if she has recurrence, will need to consider AVJ ablation and pacemaker implant -- we discussed this briefly today          ILR - s/p explant 5/16 Secondary hypercoagulable state:  continue anticoagulation Anemia: she has not had any dark stools. She has been on supplementation in the past, but discontinued it fairly recently.             Medication Adjustments/Labs and Tests Ordered: Current medicines are reviewed at length with the patient today.  Concerns regarding medicines are outlined above.  No orders of the defined types were placed in this encounter.  No orders of the defined types were placed in this encounter.    Signed, Maurice Small, MD  09/17/2022 9:59 AM    [facility]

## 2022-09-17 NOTE — Patient Instructions (Signed)
Medication Instructions:  DECREASE Amiodarone to 200 mg once daily  *If you need a refill on your cardiac medications before your next appointment, please call your pharmacy*   Follow-Up: At [facility], you and your health needs are our priority.  As part of our continuing mission to provide you with exceptional heart care, we have created designated Provider Care Teams.  These Care Teams include your primary Cardiologist (physician) and Advanced Practice Providers (APPs -  Physician Assistants and Nurse Practitioners) who all work together to provide you with the care you need, when you need it.  We recommend signing up for the patient portal called "MyChart".  Sign up information is provided on this After Visit Summary.  MyChart is used to connect with patients for Virtual Visits (Telemedicine).  Patients are able to view lab/test results, encounter notes, upcoming appointments, etc.  Non-urgent messages can be sent to your provider as well.   To learn more about what you can do with MyChart, go to [email or web address].    Your next appointment:   3 month(s)  Provider:   York Pellant, MD

## 2022-09-27 ENCOUNTER — Telehealth: Payer: Self-pay | Admitting: Cardiovascular Disease

## 2022-09-27 NOTE — Telephone Encounter (Signed)
Spoke with patient, states she can feel (palpitations) when she goes back into AF and tracks it using [employer]. Currently, heart rate is 78 while on the phone, no symptoms. Informed patient that she does not need to take a diltiazem 30 mg tablet every time she feels she has went into AF, only if heart rate greater than 100. Patient also aware to monitor her blood pressure for lower readings if additional doses are needed, states it hasn't been an issue at this time but made aware to change positions slowly due to possible dizziness. Patient has a follow up appt at the AF clinic next week. No further questions or concerns at this time.

## 2022-09-27 NOTE — Telephone Encounter (Signed)
Patient c/o Palpitations:  High priority if patient c/o lightheadedness, shortness of breath, or chest pain  How long have you had palpitations/irregular HR/ Afib? Are you having the symptoms now? yes  Are you currently experiencing lightheadedness, SOB or CP? Slight cp. Lightheadedness   Do you have a history of afib (atrial fibrillation) or irregular heart rhythm? yes  Have you checked your BP or HR? (document readings if available): no  Are you experiencing any other symptoms? Since 9:30 this morning Going in and out of afib.

## 2022-10-03 ENCOUNTER — Ambulatory Visit (HOSPITAL_COMMUNITY)
Admission: RE | Admit: 2022-10-03 | Discharge: 2022-10-03 | Disposition: A | Discharge status: Home / Self Care | Payer: Medicare Other | Source: Ambulatory Visit | Attending: Physician Assistant | Admitting: Physician Assistant

## 2022-10-03 ENCOUNTER — Encounter (HOSPITAL_COMMUNITY): Payer: Self-pay | Admitting: Physician Assistant

## 2022-10-03 VITALS — BP 136/100 | HR 60 | Ht 68.0 in | Wt 198.5 lb

## 2022-10-03 DIAGNOSIS — D6869 Other thrombophilia: Secondary | ICD-10-CM | POA: Diagnosis not present

## 2022-10-03 DIAGNOSIS — Z79899 Other long term (current) drug therapy: Secondary | ICD-10-CM

## 2022-10-03 DIAGNOSIS — I4819 Other persistent atrial fibrillation: Secondary | ICD-10-CM | POA: Insufficient documentation

## 2022-10-03 DIAGNOSIS — I4892 Unspecified atrial flutter: Secondary | ICD-10-CM | POA: Diagnosis not present

## 2022-10-03 DIAGNOSIS — Z7901 Long term (current) use of anticoagulants: Secondary | ICD-10-CM | POA: Insufficient documentation

## 2022-10-03 DIAGNOSIS — Z8249 Family history of ischemic heart disease and other diseases of the circulatory system: Secondary | ICD-10-CM | POA: Diagnosis not present

## 2022-10-03 DIAGNOSIS — I10 Essential (primary) hypertension: Secondary | ICD-10-CM | POA: Diagnosis not present

## 2022-10-03 DIAGNOSIS — Z5181 Encounter for therapeutic drug level monitoring: Secondary | ICD-10-CM | POA: Diagnosis not present

## 2022-10-03 MED ORDER — APIXABAN 5 MG PO TABS
5.0000 mg | ORAL_TABLET | Freq: Two times a day (BID) | ORAL | 1 refills | Status: DC
Start: 2022-10-03 — End: 2023-02-25

## 2022-10-03 MED ORDER — DILTIAZEM HCL 30 MG PO TABS
ORAL_TABLET | ORAL | 1 refills | Status: DC
Start: 2022-10-03 — End: 2022-10-28

## 2022-10-03 NOTE — Progress Notes (Signed)
Primary Care Physician: Jearld Lesch, MD Referring Physician: Dr. Johney Frame Primary EP: Dr Nelly Laurence   Nicole [name] is a 72 y.o. adult with a h/o anemia,  afib, and syncope possibly 2/2 termination pause  that presented to his office with afib with RVR at 160 bpm and was admitted for Tikosyn load. Converted to SR without pause. Qtc remained stable. She is on Eliquis for a CHADS2VASC score of 3. She is s/p repeat afib ablation with Dr Johney Frame on 03/14/20. Patient had tachypalpitations on 02/27/21 and took one of her PRN diltiazem which did not resolve her symptoms. EMS was called and ECG conformed afib with RVR. She was given IV diltiazem which converted her to SR. Patient does report that he niece has passed away the day before the onset of her symptoms. She has had 3 episodes of afib since the start of 2023 and went to the ED 05/09/21. She spontaneously converted en route. She was seen by Dr Elberta Fortis 06/18/21 and her BB was increased.   At her visit 11/20/21, patient reported that she had been feeling "terrible". She becomes very SOB with minimal exertion. CBC showed severe anemia with Hgb 5.2, she was sent to the ED for evaluation and transfusion, given 2 units PRBCs. Patient underwent EGD/pill endoscopy and colonoscopy. Eliquis was held initially due to suspected GI bleed. Dr. Chales Abrahams updated the patient on the results of her pill endoscopy on the afternoon of 11/23/2021 noting two areas of nonactive bleeding in her small intestine that could explain her positive FOBT. Patient reports that she felt better almost immediately after her first transfusion.   Patient was seen at the ED 02/01/22 with rapid afib in the setting of a UTI. She woke that morning with palpitations and checked her Kardia mobile and found to be A-fib RVR with rate in the low 200s.  She took a dose of Cardizem 30 mg at 1 AM and a repeat dose of Cardizem 30 mg at 5 in the morning and also took her home Eliquis dose and Tikosyn dose.  When  EMS got to her, she was A-fib RVR 120-140s bpm. She was back in SR on arrival to the ED.   Patient was again seen at the ED for afib with RVR and underwent DCCV on 05/13/22. There were no specific triggers that she could identify. No current bleeding issues on anticoagulation. Hgb 13.0.  Patient seen by Dr Nelly Laurence and is planning for repeat ablation. Unfortunately, she had rapid afib again and underwent DCCV in the ED on 06/11/22. She took her PRN diltiazem with no improvement. When EMS arrived her heart rate was up to 180 bpm.   Her dofetilide was stopped with plans to change to amiodarone but she developed rapid afib again during the 3 day washout period. She was admitted and loaded on IV amiodarone with another DCCV on 06/20/22.  On follow up today, patient is s/p repeat ablation with Dr Nelly Laurence 09/05/22. Only a small amount of touch-up was required to re-isolate the pulmonary veins. During the case, she converted to an atypical flutter that converted with ablation of a line extending from the RSPV to the mitral annulus. However, she has recurrence of multiple atrial arrhythmias with variable cycle length and activation sequences. Patient reports that she has had one episode of tachypalpitations since the ablation which resolved quickly with PRN diltiazem and rest. She denies chest pain, swallowing pain, or groin issues.   Today, she denies symptoms of palpitations, CP, orthopnea, PND, dizziness,  presyncope, syncope, or neurologic sequela.  otherwise without complaint today.   Past Medical History:  Diagnosis Date   Anticoagulant long-term use 11/16/2014   Anxiety 10/06/2017   Arthritis 10/06/2017   knees bilateral   Asthma 11/16/2014   Esophageal reflux 11/16/2014   Essential hypertension 08/15/2015   pt denies   H/O: GI bleed 12/11/2016   Hyperlipidemia 11/17/2014   Migraine headache 11/16/2014   Nausea 11/16/2014   Pain in limb 11/16/2014   Panic disorder 11/16/2014   Paroxysmal atrial fibrillation  (HCC) 11/17/2014   Swelling of joint 11/16/2014   Symptomatic anemia 11/24/2016   Tendonitis 11/16/2014   UTI (urinary tract infection) 12/19/2015   Past Surgical History:  Procedure Laterality Date   ABDOMINAL HYSTERECTOMY     ATRIAL FIBRILLATION ABLATION N/A 03/14/2020   Procedure: ATRIAL FIBRILLATION ABLATION;  Surgeon: Hillis Range, MD;  Location: [facility];  Service: Cardiovascular;  Laterality: N/A;   ATRIAL FIBRILLATION ABLATION N/A 09/05/2022   Procedure: ATRIAL FIBRILLATION ABLATION;  Surgeon: Maurice Small, MD;  Location: [facility];  Service: Cardiovascular;  Laterality: N/A;   BIOPSY  11/22/2021   Procedure: BIOPSY;  Surgeon: Jenel Lucks, MD;  Location: [facility] ENDOSCOPY;  Service: Gastroenterology;;   CARDIOVERSION     CARDIOVERSION N/A 06/21/2022   Procedure: CARDIOVERSION;  Surgeon: Chrystie Nose, MD;  Location: [facility] ENDOSCOPY;  Service: Cardiovascular;  Laterality: N/A;   CESAREAN SECTION     COLONOSCOPY     COLONOSCOPY N/A 11/22/2021   Procedure: COLONOSCOPY;  Surgeon: Jenel Lucks, MD;  Location: [facility] ENDOSCOPY;  Service: Gastroenterology;  Laterality: N/A;   ESOPHAGOGASTRODUODENOSCOPY (EGD) WITH PROPOFOL N/A 11/22/2021   Procedure: ESOPHAGOGASTRODUODENOSCOPY (EGD) WITH PROPOFOL;  Surgeon: Jenel Lucks, MD;  Location: [facility] ENDOSCOPY;  Service: Gastroenterology;  Laterality: N/A;   GIVENS CAPSULE STUDY N/A 11/22/2021   Procedure: GIVENS CAPSULE STUDY;  Surgeon: Jenel Lucks, MD;  Location: [facility] ENDOSCOPY;  Service: Gastroenterology;  Laterality: N/A;   LOOP RECORDER INSERTION N/A 02/04/2018   Procedure: LOOP RECORDER INSERTION;  Surgeon: Hillis Range, MD;  Location: [facility];  Service: Cardiovascular;  Laterality: N/A;   LOOP RECORDER REMOVAL N/A 09/05/2022   Procedure: LOOP RECORDER REMOVAL;  Surgeon: Maurice Small, MD;  Location: [facility];  Service: Cardiovascular;  Laterality: N/A;   POLYPECTOMY  11/22/2021    Procedure: POLYPECTOMY;  Surgeon: Jenel Lucks, MD;  Location: [facility] ENDOSCOPY;  Service: Gastroenterology;;   ROTATOR CUFF REPAIR Left     Current Outpatient Medications  Medication Sig Dispense Refill   acetaminophen (TYLENOL) 500 MG tablet Take 500 mg by mouth 2 (two) times daily as needed for mild pain, headache, moderate pain or fever.     amiodarone (PACERONE) 200 MG tablet Take 1 tablet (200 mg total) by mouth daily. 90 tablet 3   apixaban (ELIQUIS) 5 MG TABS tablet Take 1 tablet (5 mg total) by mouth 2 (two) times daily. 180 tablet 1   Butalbital-APAP-Caffeine 50-300-40 MG CAPS Take 1 capsule by mouth 3 (three) times daily as needed (migraine).     butorphanol (STADOL) 10 MG/ML nasal spray Place 1 spray into the nose every 4 (four) hours as needed for headache or migraine.      cetirizine (ZYRTEC) 10 MG tablet Take 10 mg by mouth daily as needed for allergies.     clonazePAM (KLONOPIN) 1 MG tablet Take 1 mg by mouth 3 (three) times daily.     diltiazem (CARDIZEM CD) 360  MG 24 hr capsule Take 1 capsule (360 mg total) by mouth daily. 90 capsule 3   diltiazem (CARDIZEM) 30 MG tablet Take 1 Tablet Every 4 Hours As Needed For HR >100 (Patient taking differently: Take 30 mg by mouth every 4 (four) hours as needed (For Heart Rate over 100).) 45 tablet 1   ferrous sulfate 324 MG TBEC Take 1 tablet (324 mg total) by mouth daily. 90 tablet 3   fluticasone-salmeterol (ADVAIR HFA) 115-21 MCG/ACT inhaler Inhale 2 puffs into the lungs 2 (two) times daily as needed (asthma).     furosemide (LASIX) 20 MG tablet TAKE 1 TABLET BY MOUTH EVERY DAY AS NEEDED FOR EDEMA 90 tablet 3   potassium chloride SA (KLOR-CON M20) 20 MEQ tablet Take 1 tablet (20 mEq total) by mouth daily as needed (take with your furosemide as needed). 90 tablet 3   pravastatin (PRAVACHOL) 40 MG tablet Take 40 mg by mouth at bedtime.      promethazine (PHENERGAN) 25 MG tablet Take 25 mg by mouth every 6 (six) hours as needed for  nausea or vomiting.     sertraline (ZOLOFT) 50 MG tablet Take 50 mg by mouth at bedtime.     VENTOLIN HFA 108 (90 Base) MCG/ACT inhaler Inhale 2 puffs into the lungs every 6 (six) hours as needed for wheezing or shortness of breath.     No current facility-administered medications for this encounter.    Allergies  Allergen Reactions   Demerol [Meperidine Hcl] Nausea And Vomiting and Other (See Comments)    Triggers migraine.   Pneumococcal Vaccines Other (See Comments)    Triggered A-Fib   Shingrix [Zoster Vac Recomb Adjuvanted] Other (See Comments)    Triggered A-Fib   Other Other (See Comments)    Anesthesia- Takes a lot to anesthetize the patient   Prednisone Palpitations and Other (See Comments)    Tachycardia     Social History   Socioeconomic History   Marital status: Married    Spouse name: Not on file   Number of children: Not on file   Years of education: Not on file   Highest education level: Not on file  Occupational History   Not on file  Tobacco Use   Smoking status: Never   Smokeless tobacco: Never   Tobacco comments:    Never smoke 07/26/21  Vaping Use   Vaping Use: Never used  Substance and Sexual Activity   Alcohol use: Never   Drug use: Never   Sexual activity: Not on file  Other Topics Concern   Not on file  Social History Narrative   Lives in [place] with spouse   Retired from school system.   Social Determinants of Health   Financial Resource Strain: Not on file  Food Insecurity: No Food Insecurity (08/17/2022)   Hunger Vital Sign    Worried About Running Out of Food in the Last Year: Never true    Ran Out of Food in the Last Year: Never true  Transportation Needs: No Transportation Needs (08/17/2022)   PRAPARE - Administrator, Civil Service (Medical): No    Lack of Transportation (Non-Medical): No  Physical Activity: Not on file  Stress: Not on file  Social Connections: Not on file  Intimate Partner Violence: Not At Risk  (08/17/2022)   Humiliation, Afraid, Rape, and Kick questionnaire    Fear of Current or Ex-Partner: No    Emotionally Abused: No    Physically Abused: No  Sexually Abused: No    Family History  Problem Relation Age of Onset   Heart attack Mother    Heart disease Mother    Heart disease Sister    Heart disease Sister    Heart disease Sister    Heart disease Sister    Heart disease Sister     ROS- All systems are reviewed and negative except as per the HPI above  Physical Exam: Vitals:   10/03/22 1327  Weight: 90 kg  Height: 5\' 8"  (1.727 m)    Wt Readings from Last 3 Encounters:  10/03/22 90 kg  09/17/22 90.4 kg  09/05/22 90.3 kg    Labs: Lab Results  Component Value Date   NA 137 09/01/2022   K 3.7 09/01/2022   CL 106 09/01/2022   CO2 21 (L) 09/01/2022   GLUCOSE 101 (H) 09/01/2022   BUN 10 09/01/2022   CREATININE 1.07 (H) 09/01/2022   CALCIUM 9.2 09/01/2022   MG 2.2 09/01/2022   Lab Results  Component Value Date   INR 1.3 (H) 08/17/2022   Lab Results  Component Value Date   CHOL 125 08/18/2022   HDL 29 (L) 08/18/2022   LDLCALC 75 08/18/2022   TRIG 106 08/18/2022     GEN- The patient is a well appearing female, alert and oriented x 3 today.   HEENT-head normocephalic, atraumatic, sclera clear, conjunctiva pink, hearing intact, trachea midline. Lungs- Clear to ausculation bilaterally, normal work of breathing Heart- Regular rate and rhythm, no murmurs, rubs or gallops  GI- soft, NT, ND, + BS Extremities- no clubbing, cyanosis, or edema MS- no significant deformity or atrophy Skin- no rash or lesion Psych- euthymic mood, full affect Neuro- strength and sensation are intact   EKG today demonstrates  SR, 1st degree AV block Vent. rate 60 BPM PR interval 216 ms QRS duration 88 ms QT/QTcB 408/408 ms   Echo 12/06/21 demonstrated   1. Left ventricular ejection fraction, by estimation, is 60 to 65%. The  left ventricle has normal function. The  left ventricle has no regional  wall motion abnormalities. There is mild asymmetric left ventricular  hypertrophy of the septal segment. Left ventricular diastolic parameters are consistent with Grade II diastolic dysfunction (pseudonormalization). Elevated left ventricular end-diastolic pressure.   2. Right ventricular systolic function is normal. The right ventricular  size is normal. There is normal pulmonary artery systolic pressure.   3. The mitral valve is normal in structure. Trivial mitral valve  regurgitation. No evidence of mitral stenosis.   4. The aortic valve is tricuspid. Aortic valve regurgitation is trivial.  No aortic stenosis is present.   5. Aortic dilatation noted. There is mild dilatation of the aortic root,  measuring 38 mm. There is borderline dilatation of the ascending aorta,  measuring 36 mm.   6. The inferior vena cava is normal in size with greater than 50%  respiratory variability, suggesting right atrial pressure of 3 mmHg.    Epic records reviewed   CHA2DS2-VASc Score = 3  The patient's score is based upon: CHF History: 0 HTN History: 1 Diabetes History: 0 Stroke History: 0 Vascular Disease History: 0 Age Score: 1 Gender Score: 1       ASSESSMENT AND PLAN: 1. Persistent atrial fibrillation/atrial flutter  The patient's CHA2DS2-VASc score is 3, indicating a 3.2% annual risk of stroke.   S/p repeat ablation with Dr Johney Frame 03/14/20 and Dr Nelly Laurence 09/05/22 ILR at [facility], explanted at time of ablation.  Failed dofetilide (ineffective)  Continue amiodarone 200 mg daily Recent TSH and LFTs reviewed.  Continue diltiazem 360 mg daily with 30 mg PRN q 4hrs for heart racing. Continue Eliquis 5 mg BID with no missed doses for 3 months post ablation.  If she has recurrence of her afib, next step would be AV node ablation +PPM.   2. Secondary Hypercoagulable State (ICD10:  D68.69) The patient is at significant risk for stroke/thromboembolism based upon her  CHA2DS2-VASc Score of 3.  Continue Apixaban (Eliquis). If she has recurrent anemia, could consider referral for Watchman.   3. HTN Stable, no changes today.   Follow up with Dr Nelly Laurence as scheduled.    Jorja Loa PA-C [facility] [address] [place], [place] [address] [phone]

## 2022-10-26 ENCOUNTER — Other Ambulatory Visit (HOSPITAL_COMMUNITY): Payer: Self-pay | Admitting: Physician Assistant

## 2022-10-26 DIAGNOSIS — I4819 Other persistent atrial fibrillation: Secondary | ICD-10-CM

## 2022-11-26 DIAGNOSIS — Z85828 Personal history of other malignant neoplasm of skin: Secondary | ICD-10-CM | POA: Diagnosis not present

## 2022-11-26 DIAGNOSIS — L821 Other seborrheic keratosis: Secondary | ICD-10-CM | POA: Diagnosis not present

## 2022-11-26 DIAGNOSIS — D485 Neoplasm of uncertain behavior of skin: Secondary | ICD-10-CM | POA: Diagnosis not present

## 2022-11-26 DIAGNOSIS — L570 Actinic keratosis: Secondary | ICD-10-CM | POA: Diagnosis not present

## 2022-11-26 DIAGNOSIS — C44319 Basal cell carcinoma of skin of other parts of face: Secondary | ICD-10-CM | POA: Diagnosis not present

## 2022-11-26 DIAGNOSIS — Z859 Personal history of malignant neoplasm, unspecified: Secondary | ICD-10-CM | POA: Diagnosis not present

## 2022-11-26 DIAGNOSIS — D225 Melanocytic nevi of trunk: Secondary | ICD-10-CM | POA: Diagnosis not present

## 2022-12-06 ENCOUNTER — Ambulatory Visit: Payer: Medicare Other | Attending: Cardiovascular Disease | Admitting: Cardiovascular Disease

## 2022-12-06 ENCOUNTER — Encounter: Payer: Self-pay | Admitting: Cardiovascular Disease

## 2022-12-06 VITALS — BP 136/80 | HR 68 | Ht 68.0 in | Wt 203.4 lb

## 2022-12-06 DIAGNOSIS — I4819 Other persistent atrial fibrillation: Secondary | ICD-10-CM | POA: Diagnosis not present

## 2022-12-06 DIAGNOSIS — I4892 Unspecified atrial flutter: Secondary | ICD-10-CM | POA: Diagnosis not present

## 2022-12-06 DIAGNOSIS — I4719 Other supraventricular tachycardia: Secondary | ICD-10-CM

## 2022-12-06 MED ORDER — DILTIAZEM HCL ER COATED BEADS 360 MG PO CP24
360.0000 mg | ORAL_CAPSULE | Freq: Every day | ORAL | 3 refills | Status: AC
Start: 2022-12-06 — End: ?

## 2022-12-06 NOTE — H&P (View-Only) (Signed)
 Electrophysiology Office Note:    Date:  12/06/2022   ID:  Nicole [name], DOB 03-Oct-1950, MRN [id number]  PCP:  Trudy Vaughan HERO, MD   [facility] Providers Cardiologist:  None Electrophysiologist:  Eulas FORBES Furbish, MD     Referring MD: Trudy Vaughan HERO, MD   History of Present Illness:    Nicole [name] is a 72 y.o. adult with a hx listed below, significant for atrial fibrillation s/p ablation and Tikosyn , referred for arrhythmia management.     She underwent a repeat AF ablation by Dr. Kelsie in Nov 2021. 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 I reviewed the ECGs from the recent visits. They do appear to show AF rather than an atypical flutter.  She is very hesitant to consider a convergent procedure.  She continued to have frequent episodes of atrial fibrillation and flutter, a few requiring ER visits. She was switched from Tikosyn  to amiodarone . She underwent a third ablation on Sep 05, 2022. Only a small amount of touch-up was required to re-isolate the pulmonary veins. During the case, she converted to an atypical flutter that converted with ablation of a line extending from the RSPV to the mitral annulus. However, she has had recurrence of multiple atrial arrhythmias with variable cycle length and activation sequences.  Her loop recorder was removed.  She did well for a time period after the ablation but then began to have recurrences again.  Amiodarone  was started and again, she did well for a time period.  She notes now that over the past several months she has had increasing episodes of atrial fibrillation, often times with rapid rates and severe symptoms.    Today, she reports that she is feeling well.  However, she is having episodes of rapid  palpitations and severe fatigue 2 or 3 times a week despite amiodarone .     EKGs/Labs/Other Studies Reviewed Today:      EKG Interpretation Date/Time:  Friday December 06 2022 14:01:36 EDT Ventricular Rate:  68 PR Interval:  206 QRS Duration:  86 QT Interval:  404 QTC Calculation: 429 R Axis:   68  Text Interpretation: Normal sinus rhythm Normal ECG When compared with ECG of 03-Oct-2022 13:36, No significant change was found Confirmed by Furbish Eulas [phone]) on 12/06/2022 2:09:53 PM    Recent Labs: 05/13/2022: B Natriuretic Peptide 237.1 09/01/2022: ALT 29; BUN 10; Creatinine, Ser 1.07; Hemoglobin 11.8; Magnesium 2.2; Platelets 302; Potassium 3.7; Sodium 137; TSH 2.570     Physical Exam:    VS:  BP 136/80 (BP Location: Left Arm, Patient Position: Sitting, Cuff Size: Large)   Pulse 68   Ht 5\' 8"  (1.727 m)   Wt 203 lb 6.4 oz (92.3 kg)   SpO2 98%   BMI 30.93 kg/m     Wt Readings from Last 3 Encounters:  12/06/22 203 lb 6.4 oz (92.3 kg)  10/03/22 198 lb 8 oz (90 kg)  09/17/22 199 lb 3.2 oz (90.4 kg)     GEN: Well nourished, well developed in no acute distress CARDIAC: RRR, no murmurs, rubs, gallops RESPIRATORY:  Normal work of breathing MUSCULOSKELETAL: no edema    ASSESSMENT & PLAN:    Atrial fibrillation: s/p ablation x3, now on amiodarone . Now having symptomatic recurrence on amiodarone  200mg  daily Continue  amiodarone  for now She has tachy-brady and is very symptomatic with tachycardia/AF episodes We have exhausted options for rhythm control At this point, we will need to place a pacemaker to be more aggressive with rate control, which may require AVJ ablation I explained the indication and rationale for pacemaker placement  I discussed the indication for the procedure and the logistics, risks, potential benefit, and after care. I specifically explained that risks include but are not limited to infection, bleeding,damage to blood vessels, lung, and the heart  -- but risk of prolonged hospitalization, need for surgery, or the event of stroke, heart attack, or death are low but not zero.            ILR - s/p explant 5/16  Secondary hypercoagulable state:   continue apixaban  5 mg  Anemia: she has not had any dark stools. She has been on supplementation in the past, but discontinued it fairly recently.             Medication Adjustments/Labs and Tests Ordered: Current medicines are reviewed at length with the patient today.  Concerns regarding medicines are outlined above.  Orders Placed This Encounter  Procedures   EKG 12-Lead   No orders of the defined types were placed in this encounter.    Signed, Eulas FORBES Furbish, MD  12/06/2022 2:09 PM    [facility]

## 2022-12-06 NOTE — Progress Notes (Signed)
Electrophysiology Office Note:    Date:  12/06/2022   ID:  Nicole [name], DOB 12-14-1950, MRN [id number]  PCP:  Jearld Lesch, MD   [facility] Providers Cardiologist:  None Electrophysiologist:  Maurice Small, MD     Referring MD: Jearld Lesch, MD   History of Present Illness:    Nicole [name] is a 72 y.o. adult with a hx listed below, significant for atrial fibrillation s/p ablation and Tikosyn, referred for arrhythmia management.     She underwent a repeat AF ablation by Dr. Johney Frame in Nov 2021. 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 I reviewed the ECGs from the recent visits. They do appear to show AF rather than an atypical flutter.  She is very hesitant to consider a convergent procedure.  She continued to have frequent episodes of atrial fibrillation and flutter, a few requiring ER visits. She was switched from Tikosyn to amiodarone. She underwent a third ablation on Sep 05, 2022. Only a small amount of touch-up was required to re-isolate the pulmonary veins. During the case, she converted to an atypical flutter that converted with ablation of a line extending from the RSPV to the mitral annulus. However, she has had recurrence of multiple atrial arrhythmias with variable cycle length and activation sequences.  Her loop recorder was removed.  She did well for a time period after the ablation but then began to have recurrences again.  Amiodarone was started and again, she did well for a time period.  She notes now that over the past several months she has had increasing episodes of atrial fibrillation, often times with rapid rates and severe symptoms.    Today, she reports that she is feeling well.  However, she is having episodes of rapid  palpitations and severe fatigue 2 or 3 times a week despite amiodarone.     EKGs/Labs/Other Studies Reviewed Today:      EKG Interpretation Date/Time:  Friday December 06 2022 14:01:36 EDT Ventricular Rate:  68 PR Interval:  206 QRS Duration:  86 QT Interval:  404 QTC Calculation: 429 R Axis:   68  Text Interpretation: Normal sinus rhythm Normal ECG When compared with ECG of 03-Oct-2022 13:36, No significant change was found Confirmed by York Pellant [phone]) on 12/06/2022 2:09:53 PM    Recent Labs: 05/13/2022: B Natriuretic Peptide 237.1 09/01/2022: ALT 29; BUN 10; Creatinine, Ser 1.07; Hemoglobin 11.8; Magnesium 2.2; Platelets 302; Potassium 3.7; Sodium 137; TSH 2.570     Physical Exam:    VS:  BP 136/80 (BP Location: Left Arm, Patient Position: Sitting, Cuff Size: Large)   Pulse 68   Ht 5\' 8"  (1.727 m)   Wt 203 lb 6.4 oz (92.3 kg)   SpO2 98%   BMI 30.93 kg/m     Wt Readings from Last 3 Encounters:  12/06/22 203 lb 6.4 oz (92.3 kg)  10/03/22 198 lb 8 oz (90 kg)  09/17/22 199 lb 3.2 oz (90.4 kg)     GEN: Well nourished, well developed in no acute distress CARDIAC: RRR, no murmurs, rubs, gallops RESPIRATORY:  Normal work of breathing MUSCULOSKELETAL: no edema    ASSESSMENT & PLAN:    Atrial fibrillation: s/p ablation x3, now on amiodarone. Now having symptomatic recurrence on amiodarone 200mg  daily Continue  amiodarone for now She has tachy-brady and is very symptomatic with tachycardia/AF episodes We have exhausted options for rhythm control At this point, we will need to place a pacemaker to be more aggressive with rate control, which may require AVJ ablation I explained the indication and rationale for pacemaker placement  I discussed the indication for the procedure and the logistics, risks, potential benefit, and after care. I specifically explained that risks include but are not limited to infection, bleeding,damage to blood vessels, lung, and the heart  -- but risk of prolonged hospitalization, need for surgery, or the event of stroke, heart attack, or death are low but not zero.            ILR - s/p explant 5/16  Secondary hypercoagulable state:   continue apixaban 5 mg  Anemia: she has not had any dark stools. She has been on supplementation in the past, but discontinued it fairly recently.             Medication Adjustments/Labs and Tests Ordered: Current medicines are reviewed at length with the patient today.  Concerns regarding medicines are outlined above.  Orders Placed This Encounter  Procedures   EKG 12-Lead   No orders of the defined types were placed in this encounter.    Signed, Maurice Small, MD  12/06/2022 2:09 PM    [facility]

## 2022-12-06 NOTE — Patient Instructions (Addendum)
Medication Instructions:  Your physician recommends that you continue on your current medications as directed. Please refer to the Current Medication list given to you today. *If you need a refill on your cardiac medications before your next appointment, please call your pharmacy*   Lab Work: CBC and BMET TODAY If you have labs (blood work) drawn today and your tests are completely normal, you will receive your results only by: MyChart Message (if you have MyChart) OR A paper copy in the mail If you have any lab test that is abnormal or we need to change your treatment, we will call you to review the results.   Testing/Procedures: Pacemaker - Scheduled for Tuesday, September 10 Your physician has recommended that you have a pacemaker inserted. A pacemaker is a small device that is placed under the skin of your chest or abdomen to help control abnormal heart rhythms. This device uses electrical pulses to prompt the heart to beat at a normal rate. Pacemakers are used to treat heart rhythms that are too slow. Wire (leads) are attached to the pacemaker that goes into the chambers of you heart. This is done in the hospital and usually requires and overnight stay. Please see the instruction sheet given to you today for more information.   Follow-Up: At [facility], you and your health needs are our priority.  As part of our continuing mission to provide you with exceptional heart care, we have created designated Provider Care Teams.  These Care Teams include your primary Cardiologist (physician) and Advanced Practice Providers (APPs -  Physician Assistants and Nurse Practitioners) who all work together to provide you with the care you need, when you need it.  We recommend signing up for the patient portal called "MyChart".  Sign up information is provided on this After Visit Summary.  MyChart is used to connect with patients for Virtual Visits (Telemedicine).  Patients are able to view  lab/test results, encounter notes, upcoming appointments, etc.  Non-urgent messages can be sent to your provider as well.   To learn more about what you can do with MyChart, go to [email or web address].    Your next appointment:   We will schedule follow up after pacemaker implant  Provider:   York Pellant, MD

## 2022-12-07 LAB — BASIC METABOLIC PANEL
BUN/Creatinine Ratio: 11 — ABNORMAL LOW (ref 12–28)
BUN: 11 mg/dL (ref 8–27)
CO2: 21 mmol/L (ref 20–29)
Calcium: 9.2 mg/dL (ref 8.7–10.3)
Chloride: 101 mmol/L (ref 96–106)
Creatinine, Ser: 1.04 mg/dL — ABNORMAL HIGH (ref 0.57–1.00)
Glucose: 87 mg/dL (ref 70–99)
Potassium: 4.7 mmol/L (ref 3.5–5.2)
Sodium: 136 mmol/L (ref 134–144)
eGFR: 57 mL/min/{1.73_m2} — ABNORMAL LOW (ref >59)

## 2022-12-07 LAB — CBC
Hematocrit: 30.1 % — ABNORMAL LOW (ref 34.0–46.6)
Hemoglobin: 9.7 g/dL — ABNORMAL LOW (ref 11.1–15.9)
MCH: 26.1 pg — ABNORMAL LOW (ref 26.6–33.0)
MCHC: 32.2 g/dL (ref 31.5–35.7)
MCV: 81 fL (ref 79–97)
Platelets: 350 10*3/uL (ref 150–450)
RBC: 3.71 x10E6/uL — ABNORMAL LOW (ref 3.77–5.28)
RDW: 12.8 % (ref 11.7–15.4)
WBC: 3.7 10*3/uL (ref 3.4–10.8)

## 2022-12-14 ENCOUNTER — Other Ambulatory Visit (HOSPITAL_COMMUNITY): Payer: Self-pay | Admitting: Cardiology

## 2022-12-14 DIAGNOSIS — I4891 Unspecified atrial fibrillation: Secondary | ICD-10-CM

## 2022-12-14 DIAGNOSIS — I480 Paroxysmal atrial fibrillation: Secondary | ICD-10-CM

## 2022-12-22 ENCOUNTER — Other Ambulatory Visit: Payer: Self-pay

## 2022-12-22 ENCOUNTER — Emergency Department (HOSPITAL_COMMUNITY)
Admission: EM | Admit: 2022-12-22 | Discharge: 2022-12-22 | Disposition: A | Discharge status: Home / Self Care | Payer: Medicare Other | Attending: Emergency Medicine | Admitting: Emergency Medicine

## 2022-12-22 ENCOUNTER — Telehealth: Payer: Self-pay | Admitting: Cardiology

## 2022-12-22 ENCOUNTER — Encounter (HOSPITAL_COMMUNITY): Payer: Self-pay

## 2022-12-22 ENCOUNTER — Emergency Department (HOSPITAL_COMMUNITY): Payer: Medicare Other

## 2022-12-22 DIAGNOSIS — Z7901 Long term (current) use of anticoagulants: Secondary | ICD-10-CM | POA: Diagnosis not present

## 2022-12-22 DIAGNOSIS — R0789 Other chest pain: Secondary | ICD-10-CM | POA: Diagnosis not present

## 2022-12-22 DIAGNOSIS — R079 Chest pain, unspecified: Secondary | ICD-10-CM | POA: Diagnosis not present

## 2022-12-22 DIAGNOSIS — R000 Tachycardia, unspecified: Secondary | ICD-10-CM | POA: Diagnosis not present

## 2022-12-22 DIAGNOSIS — I4891 Unspecified atrial fibrillation: Secondary | ICD-10-CM | POA: Diagnosis not present

## 2022-12-22 DIAGNOSIS — I959 Hypotension, unspecified: Secondary | ICD-10-CM | POA: Diagnosis not present

## 2022-12-22 DIAGNOSIS — I480 Paroxysmal atrial fibrillation: Secondary | ICD-10-CM | POA: Diagnosis not present

## 2022-12-22 LAB — BASIC METABOLIC PANEL
Anion gap: 11 (ref 5–15)
BUN: 9 mg/dL (ref 8–23)
CO2: 21 mmol/L — ABNORMAL LOW (ref 22–32)
Calcium: 8.7 mg/dL — ABNORMAL LOW (ref 8.9–10.3)
Chloride: 103 mmol/L (ref 98–111)
Creatinine, Ser: 0.9 mg/dL (ref 0.44–1.00)
GFR, Estimated: >60 mL/min (ref >60)
Glucose, Bld: 96 mg/dL (ref 70–99)
Potassium: 5 mmol/L (ref 3.5–5.1)
Sodium: 135 mmol/L (ref 135–145)

## 2022-12-22 LAB — CBC
HCT: 31.2 % — ABNORMAL LOW (ref 36.0–46.0)
Hemoglobin: 9.5 g/dL — ABNORMAL LOW (ref 12.0–15.0)
MCH: 25 pg — ABNORMAL LOW (ref 26.0–34.0)
MCHC: 30.4 g/dL (ref 30.0–36.0)
MCV: 82.1 fL (ref 80.0–100.0)
Platelets: 386 10*3/uL (ref 150–400)
RBC: 3.8 MIL/uL — ABNORMAL LOW (ref 3.87–5.11)
RDW: 13.8 % (ref 11.5–15.5)
WBC: 4.3 10*3/uL (ref 4.0–10.5)
nRBC: 0 % (ref 0.0–0.2)

## 2022-12-22 LAB — TROPONIN I (HIGH SENSITIVITY): Troponin I (High Sensitivity): 4 ng/L (ref <18)

## 2022-12-22 MED ORDER — DILTIAZEM HCL 25 MG/5ML IV SOLN
15.0000 mg | Freq: Once | INTRAVENOUS | Status: AC
  Administered 2022-12-22: 15 mg via INTRAVENOUS
  Filled 2022-12-22: qty 5

## 2022-12-22 MED ORDER — DILTIAZEM HCL ER COATED BEADS 180 MG PO CP24
360.0000 mg | ORAL_CAPSULE | Freq: Once | ORAL | Status: AC
  Administered 2022-12-22: 360 mg via ORAL
  Filled 2022-12-22: qty 2

## 2022-12-22 NOTE — ED Notes (Signed)
Patient went to xray 

## 2022-12-22 NOTE — ED Triage Notes (Signed)
Pt arrived via [facility] for c/o chest tightness and palpitations started at 0200 today. Pt told EMS she took her prn cardizem 30 mg at 0200, 0800, 1030 today, but it didn't help. Per EMS, pt was in NSR, but had a brief period of A-fib w/RVR with hr 140 on her apple watch on the way here. Pt c/o nausea and SOB.

## 2022-12-22 NOTE — Telephone Encounter (Signed)
Patient called the answering service this AM concerned that she had been going in and out of afib all day. She has a complex history of atrial fibrillation-  has had 3 ablations in the past, failed tikosyn. Now on amiodarone but has been having increased frequency of breakthrough afib. Also has tachy-brady syndrome and is scheduled for PPM implant 9/10. When she was sleeping early this AM, she woke up because her HR was going so fast. She can feel her heart racing and feels a bit dizzy. Also notes feeling like "something is sitting on her chest". She took her amiodarone this AM, and then took 3 doses of Cardizem 30 mg. HR remains elevated to the 140s-150s.   Discussed with patient that given her complex afib history and current symptoms of dizziness, chest pressure, she should come to the ED for evaluation. Discussed that there is not much that I can do for her over the phone and she needs to be seen in person. Patient voiced understanding and plans to come to the [facility] ED for evaluation.   Jonita Albee, PA-C 12/22/2022 11:59 AM

## 2022-12-22 NOTE — ED Provider Notes (Signed)
[place] EMERGENCY DEPARTMENT AT [facility] Provider Note   CSN: [id number] Arrival date & time: 12/22/22  1309     History  Chief Complaint  Patient presents with   Chest Pain    Nicole [name] is a 72 y.o. adult.  This is a 72 year old female with history of paroxysmal A-fib currently taking amiodarone with diltiazem as needed for breakthrough presents emergency department after she has been having persistent episodes of intermittent atrial fibrillation.  Symptoms began this morning.  She has had 3 prior ablations.  She is scheduled to get a pacemaker on the 10th of this month.   Chest Pain      Home Medications Prior to Admission medications   Medication Sig Start Date End Date Taking? Authorizing Provider  acetaminophen (TYLENOL) 500 MG tablet Take 500 mg by mouth 2 (two) times daily as needed for mild pain, headache, moderate pain or fever.    [provider]  amiodarone (PACERONE) 200 MG tablet Take 1 tablet (200 mg total) by mouth daily. 09/17/22   Mealor, Roberts Gaudy, MD  apixaban (ELIQUIS) 5 MG TABS tablet Take 1 tablet (5 mg total) by mouth 2 (two) times daily. 10/03/22   Fenton, Clint R, PA  butalbital-acetaminophen-caffeine (FIORICET) 50-325-40 MG tablet Take 1 tablet by mouth 3 (three) times daily as needed (migraine). 04/17/22   [provider]  butorphanol (STADOL) 10 MG/ML nasal spray Place 1 spray into the nose every 4 (four) hours as needed for headache or migraine.     [provider]  cetirizine (ZYRTEC) 10 MG tablet Take 10 mg by mouth daily as needed for allergies.    [provider]  clonazePAM (KLONOPIN) 1 MG tablet Take 1 mg by mouth 3 (three) times daily.    [provider]  diltiazem (CARDIZEM CD) 360 MG 24 hr capsule Take 1 capsule (360 mg total) by mouth daily. 12/06/22   Mealor, Roberts Gaudy, MD  diltiazem (CARDIZEM) 30 MG tablet TAKE 1 TABLET EVERY 4 HOURS AS NEEDED FOR HR >100 10/28/22   Fenton, Clint  R, PA  ferrous sulfate 324 MG TBEC Take 1 tablet (324 mg total) by mouth daily. 07/22/22   Mealor, Roberts Gaudy, MD  fluticasone-salmeterol (ADVAIR HFA) 409-81 MCG/ACT inhaler Inhale 2 puffs into the lungs 2 (two) times daily as needed (asthma).    [provider]  furosemide (LASIX) 20 MG tablet TAKE 1 TABLET BY MOUTH EVERY DAY AS NEEDED FOR EDEMA 08/19/22   Mealor, Roberts Gaudy, MD  KLOR-CON M20 20 MEQ tablet TAKE 1 TABLET BY MOUTH EVERY DAY 12/16/22   Mealor, Roberts Gaudy, MD  pravastatin (PRAVACHOL) 40 MG tablet Take 40 mg by mouth at bedtime.  11/30/18   [provider]  promethazine (PHENERGAN) 25 MG tablet Take 25 mg by mouth every 6 (six) hours as needed for nausea or vomiting.    [provider]  sertraline (ZOLOFT) 50 MG tablet Take 50 mg by mouth at bedtime. 06/11/21   [provider]  VENTOLIN HFA 108 (90 Base) MCG/ACT inhaler Inhale 2 puffs into the lungs every 6 (six) hours as needed for wheezing or shortness of breath.    [provider]      Allergies    Demerol [meperidine hcl], Pneumococcal vaccines, Shingrix [zoster vac recomb adjuvanted], Other, and Prednisone    Review of Systems   Review of Systems  Cardiovascular:  Positive for chest pain.    Physical Exam Updated Vital Signs BP (!) 143/84 (  BP Location: Right Arm)   Pulse 66   Temp 98 F (36.7 C) (Oral)   Resp 14   Ht 5\' 8"  (1.727 m)   Wt 92.3 kg   SpO2 100%   BMI 30.94 kg/m  Physical Exam Vitals reviewed.  Cardiovascular:     Rate and Rhythm: Normal rate and regular rhythm.     Heart sounds: No murmur heard. Pulmonary:     Effort: Pulmonary effort is normal. No tachypnea.  Neurological:     Mental Status: She is alert.     ED Results / Procedures / Treatments   Labs (all labs ordered are listed, but only abnormal results are displayed) Labs Reviewed  BASIC METABOLIC PANEL - Abnormal; Notable for the following components:      Result Value   CO2 21 (*)     Calcium 8.7 (*)    All other components within normal limits  CBC - Abnormal; Notable for the following components:   RBC 3.80 (*)    Hemoglobin 9.5 (*)    HCT 31.2 (*)    MCH 25.0 (*)    All other components within normal limits  TROPONIN I (HIGH SENSITIVITY)  TROPONIN I (HIGH SENSITIVITY)    EKG EKG Interpretation Date/Time:  Sunday December 22 2022 13:17:06 EDT Ventricular Rate:  69 PR Interval:  186 QRS Duration:  84 QT Interval:  398 QTC Calculation: 426 R Axis:   53  Text Interpretation: Normal sinus rhythm Normal ECG When compared with ECG of 06-Dec-2022 14:01, PREVIOUS ECG IS PRESENT Confirmed by Anders Simmonds [phone]) on 12/22/2022 3:21:48 PM  Radiology DG Chest 2 View  Result Date: 12/22/2022 CLINICAL DATA:  Chest pain. EXAM: CHEST - 2 VIEW COMPARISON:  Chest radiographs 08/04/2022 FINDINGS: Cardiac silhouette and mediastinal contours are within normal limits. The lungs are clear. No pleural effusion or pneumothorax. There is again flattening of the diaphragms and mild hyperinflation. Mild multilevel degenerative disc changes of the thoracic spine. Interval removal of the prior anterior left chest wall cardiac loop recorder. IMPRESSION: No acute cardiopulmonary process. Electronically Signed   By: Neita Garnet M.D.   On: 12/22/2022 15:19    Procedures Procedures    Medications Ordered in ED Medications  diltiazem (CARDIZEM) injection 15 mg (has no administration in time range)  diltiazem (CARDIZEM CD) 24 hr capsule 360 mg (has no administration in time range)    ED Course/ Medical Decision Making/ A&P                                 Medical Decision Making 72 year old female here today for intermittent atrial fibrillation.  Plan-patient is well down the pathway of management for atrial fibrillation.  Currently taking amiodarone, has failed Tikosyn.  Patient currently not in atrial fibrillation.  Will reach out to the patient's cardiology service given her  multiple different attempts at medications, as well as planned procedure.  Reassessment-spoke with the on-call cardiologist, Dr. Jacques Navy.  We discussed the patient's prior management, and upcoming plan.  With the patient more frequently remaining in a normal sinus rhythm, did not recommend any additional treatment at this time.  Patient had been in normal sinus rhythm, but occasionally had episodes in the emergency department where she returned to atrial fibrillation.  Going to provide the patient with some oral diltiazem, as well as a small bolus of IV diltiazem.  She takes her oral diltiazem at 3 in the afternoon usually,  so this is not a significant deviation from her usual amount.  Ultimately, patient will require mechanical solution to her paroxysmal atrial fibrillation as medications have failed to this point.  Heart rate is controlled well enough at this time for her to proceed with planned procedure on the 10th.  My independent review the patient's chest x-ray shows no pneumonia.  Amount and/or Complexity of Data Reviewed Labs: ordered. Radiology: ordered.  Risk Prescription drug management.           Final Clinical Impression(s) / ED Diagnoses Final diagnoses:  Paroxysmal atrial fibrillation [facility])    Rx / DC Orders ED Discharge Orders     None         Anders Simmonds T, DO 12/22/22 1608

## 2022-12-22 NOTE — Discharge Instructions (Signed)
Continue taking all of your medications as prescribed.  Like we discussed, you will likely go in and out of atrial fibrillation over the next several days.  Continue to take your as needed diltiazem.  If your as needed diltiazem is not working, then please call your cardiologist or come to the emergency department.  It is very important that you get your procedure done on the 10th as this should be the solution to your atrial fibrillation.

## 2022-12-30 NOTE — Pre-Procedure Instructions (Addendum)
Attempted to call patient regarding procedure instructions.  Left voicemail on the following items: Arrival time 0800 Nothing to eat or drink after midnight No meds AM of procedure Responsible person to drive you home and stay with you for 24 hrs Wash with special soap night before and morning of procedure If on anti-coagulant drug instructions Eliquis- should have stopped it on 9/7.  Patient called back and stated she did take her Eliquis Sunday morning by accident, ok to proceed.

## 2022-12-31 ENCOUNTER — Ambulatory Visit (HOSPITAL_COMMUNITY)
Admission: RE | Disposition: A | Discharge status: Home / Self Care | Payer: Self-pay | Source: Home / Self Care | Attending: Cardiovascular Disease

## 2022-12-31 ENCOUNTER — Other Ambulatory Visit: Payer: Self-pay

## 2022-12-31 ENCOUNTER — Ambulatory Visit (HOSPITAL_COMMUNITY): Payer: Medicare Other

## 2022-12-31 ENCOUNTER — Ambulatory Visit (HOSPITAL_COMMUNITY)
Admission: RE | Admit: 2022-12-31 | Discharge: 2022-12-31 | Disposition: A | Discharge status: Home / Self Care | Payer: Medicare Other | Attending: Cardiovascular Disease | Admitting: Cardiovascular Disease

## 2022-12-31 DIAGNOSIS — Z7901 Long term (current) use of anticoagulants: Secondary | ICD-10-CM | POA: Diagnosis not present

## 2022-12-31 DIAGNOSIS — I4892 Unspecified atrial flutter: Secondary | ICD-10-CM | POA: Insufficient documentation

## 2022-12-31 DIAGNOSIS — I4891 Unspecified atrial fibrillation: Secondary | ICD-10-CM | POA: Diagnosis not present

## 2022-12-31 DIAGNOSIS — I495 Sick sinus syndrome: Secondary | ICD-10-CM | POA: Insufficient documentation

## 2022-12-31 DIAGNOSIS — D649 Anemia, unspecified: Secondary | ICD-10-CM | POA: Insufficient documentation

## 2022-12-31 DIAGNOSIS — D6869 Other thrombophilia: Secondary | ICD-10-CM | POA: Insufficient documentation

## 2022-12-31 DIAGNOSIS — Z950 Presence of cardiac pacemaker: Secondary | ICD-10-CM | POA: Diagnosis not present

## 2022-12-31 HISTORY — PX: PACEMAKER IMPLANT: EP1218

## 2022-12-31 SURGERY — PACEMAKER IMPLANT

## 2022-12-31 MED ORDER — HEPARIN (PORCINE) IN NACL 1000-0.9 UT/500ML-% IV SOLN
INTRAVENOUS | Status: DC | PRN
  Administered 2022-12-31: 500 mL

## 2022-12-31 MED ORDER — MIDAZOLAM HCL 5 MG/5ML IJ SOLN
INTRAMUSCULAR | Status: DC | PRN
  Administered 2022-12-31 (×4): 1 mg via INTRAVENOUS

## 2022-12-31 MED ORDER — MIDAZOLAM HCL 2 MG/2ML IJ SOLN
INTRAMUSCULAR | Status: AC
  Filled 2022-12-31: qty 2

## 2022-12-31 MED ORDER — SODIUM CHLORIDE 0.9 % IV SOLN: INTRAVENOUS | Status: DC

## 2022-12-31 MED ORDER — LIDOCAINE HCL (PF) 1 % IJ SOLN
INTRAMUSCULAR | Status: DC | PRN
  Administered 2022-12-31: 60 mL

## 2022-12-31 MED ORDER — SODIUM CHLORIDE 0.9 % IV SOLN
80.0000 mg | INTRAVENOUS | Status: AC
  Administered 2022-12-31: 80 mg

## 2022-12-31 MED ORDER — CHLORHEXIDINE GLUCONATE 4 % EX SOLN
4.0000 | Freq: Once | CUTANEOUS | Status: DC
  Filled 2022-12-31: qty 60

## 2022-12-31 MED ORDER — LIDOCAINE HCL (PF) 1 % IJ SOLN
INTRAMUSCULAR | Status: AC
  Filled 2022-12-31: qty 60

## 2022-12-31 MED ORDER — FENTANYL CITRATE (PF) 100 MCG/2ML IJ SOLN
INTRAMUSCULAR | Status: DC | PRN
  Administered 2022-12-31 (×4): 25 ug via INTRAVENOUS

## 2022-12-31 MED ORDER — CEFAZOLIN SODIUM-DEXTROSE 2-3 GM-%(50ML) IV SOLR: INTRAVENOUS | Status: DC | PRN

## 2022-12-31 MED ORDER — METOPROLOL TARTRATE 25 MG PO TABS
25.0000 mg | ORAL_TABLET | Freq: Two times a day (BID) | ORAL | 11 refills | Status: DC
Start: 2022-12-31 — End: 2023-01-09

## 2022-12-31 MED ORDER — POVIDONE-IODINE 10 % EX SWAB
2.0000 | Freq: Once | CUTANEOUS | Status: AC
  Administered 2022-12-31: 2 via TOPICAL

## 2022-12-31 MED ORDER — FENTANYL CITRATE (PF) 100 MCG/2ML IJ SOLN
INTRAMUSCULAR | Status: AC
  Filled 2022-12-31: qty 2

## 2022-12-31 MED ORDER — ONDANSETRON HCL 4 MG/2ML IJ SOLN: 4.0000 mg | Freq: Four times a day (QID) | INTRAMUSCULAR | Status: DC | PRN

## 2022-12-31 MED ORDER — ACETAMINOPHEN 325 MG PO TABS
325.0000 mg | ORAL_TABLET | ORAL | Status: DC | PRN
  Administered 2022-12-31: 650 mg via ORAL
  Filled 2022-12-31: qty 2

## 2022-12-31 MED ORDER — CEFAZOLIN SODIUM-DEXTROSE 2-4 GM/100ML-% IV SOLN
2.0000 g | INTRAVENOUS | Status: AC
  Administered 2022-12-31: 2 g via INTRAVENOUS

## 2022-12-31 SURGICAL SUPPLY — 16 items
BAG SNAP BAND KOVER 36X36 (MISCELLANEOUS) IMPLANT
CABLE SURGICAL S-101-97-12 (CABLE) ×1 IMPLANT
CATH CPS LOCATOR 3D MED (CATHETERS) IMPLANT
HELIX LOCKING TOOL (MISCELLANEOUS) ×1
KIT MICROPUNCTURE NIT STIFF (SHEATH) IMPLANT
LEAD ULTIPACE 52 LPA1231/52 (Lead) IMPLANT
LEAD ULTIPACE 65 LPA1231/65 (Lead) IMPLANT
PACEMAKER ASSURITY DR-RF (Pacemaker) IMPLANT
PAD DEFIB RADIO PHYSIO CONN (PAD) ×1 IMPLANT
SHEATH 7FR PRELUDE SNAP 13 (SHEATH) IMPLANT
SHEATH 9FR PRELUDE SNAP 13 (SHEATH) IMPLANT
SHEATH PROBE COVER 6X72 (BAG) IMPLANT
SLITTER AGILIS HISPRO (INSTRUMENTS) IMPLANT
TOOL HELIX LOCKING (MISCELLANEOUS) IMPLANT
TRAY PACEMAKER INSERTION (PACKS) ×1 IMPLANT
WIRE HI TORQ VERSACORE-J 145CM (WIRE) IMPLANT

## 2022-12-31 NOTE — Discharge Instructions (Signed)
After Your Pacemaker   You have a [employer] Pacemaker  ACTIVITY Do not lift your arm above shoulder height for 1 week after your procedure. After 7 days, you may progress as below.  You should remove your sling 24 hours after your procedure, unless otherwise instructed by your provider.     Tuesday January 07, 2023  Wednesday January 08, 2023 Thursday January 09, 2023 Friday January 10, 2023   Do not lift, push, pull, or carry anything over 10 pounds with the affected arm until 6 weeks (Tuesday February 11, 2023 ) after your procedure.   You may drive AFTER your wound check, unless you have been told otherwise by your provider.   Ask your healthcare provider when you can go back to work   INCISION/Dressing If you are on a blood thinner such as Eliquis, should be resumed on 01/04/2023.  If large square, outer bandage is left in place, this can be removed tomorrow at 11:00.  Do not remove steri-strips or glue.   If a PRESSURE DRESSING (a bulky dressing that usually goes up over your shoulder) was applied or left in place, please follow instructions given by your provider on when to return to have this removed.   Monitor your Pacemaker site for redness, swelling, and drainage. Call the device clinic at [phone] if you experience these symptoms or fever/chills.  If your incision is sealed with Steri-strips or staples, you may shower 7 days after your procedure or when told by your provider. Do not remove the steri-strips or let the shower hit directly on your site. You may wash around your site with soap and water.    If you were discharged in a sling, please do not wear this during the day more than 48 hours after your surgery unless otherwise instructed. This may increase the risk of stiffness and soreness in your shoulder.   Avoid lotions, ointments, or perfumes over your incision until it is well-healed.  You may use a hot tub or a pool AFTER your wound check appointment if  the incision is completely closed.  Pacemaker Alerts:  Some alerts are vibratory and others beep. These are NOT emergencies. Please call our office to let us know. If this occurs at night or on weekends, it can wait until the next business day. Send a remote transmission.  If your device is capable of reading fluid status (for heart failure), you will be offered monthly monitoring to review this with you.   DEVICE MANAGEMENT Remote monitoring is used to monitor your pacemaker from home. This monitoring is scheduled every 91 days by our office. It allows [place] to keep an eye on the functioning of your device to ensure it is working properly. You will routinely see your Electrophysiologist annually (more often if necessary).   You should receive your ID card for your new device in 4-8 weeks. Keep this card with you at all times once received. Consider wearing a medical alert bracelet or necklace.  Your Pacemaker may be MRI compatible. This will be discussed at your next office visit/wound check.  You should avoid contact with strong electric or magnetic fields.   Do not use amateur (ham) radio equipment or electric (arc) welding torches. MP3 player headphones with magnets should not be used. Some devices are safe to use if held at least 12 inches (30 cm) from your Pacemaker. These include power tools, lawn mowers, and speakers. If you are unsure if something is safe to use, ask your  health care provider.  When using your cell phone, hold it to the ear that is on the opposite side from the Pacemaker. Do not leave your cell phone in a pocket over the Pacemaker.  You may safely use electric blankets, heating pads, computers, and microwave ovens.  Call the office right away if: You have chest pain. You feel more short of breath than you have felt before. You feel more light-headed than you have felt before. Your incision starts to open up.  This information is not intended to replace advice given to  you by your health care provider. Make sure you discuss any questions you have with your health care provider.

## 2022-12-31 NOTE — Interval H&P Note (Signed)
History and Physical Interval Note:  12/31/2022 8:50 AM  Nicole [name]  has presented today for surgery, with the diagnosis of afib.  The various methods of treatment have been discussed with the patient and family. After consideration of risks, benefits and other options for treatment, the patient has consented to  Procedure(s): PACEMAKER IMPLANT- DUAL CHAMBER (N/A) as a surgical intervention.  The patient's history has been reviewed, patient examined, no change in status, stable for surgery.  I have reviewed the patient's chart and labs.  Questions were answered to the patient's satisfaction.     Roberts Gaudy Samya Siciliano

## 2023-01-01 ENCOUNTER — Encounter (HOSPITAL_COMMUNITY): Payer: Self-pay | Admitting: Cardiovascular Disease

## 2023-01-01 ENCOUNTER — Telehealth: Payer: Self-pay | Admitting: Cardiovascular Disease

## 2023-01-01 MED FILL — Midazolam HCl Inj 2 MG/2ML (Base Equivalent): INTRAMUSCULAR | Qty: 4 | Status: AC

## 2023-01-01 NOTE — Telephone Encounter (Signed)
Patient is calling back looking for answers. Please advise

## 2023-01-01 NOTE — Telephone Encounter (Signed)
Spoke with patient concerning Afib episodes and pacemaker. Educated patient that her pacemaker does not "shock" her heart back to normal rhythm and to continue her medication for her heart rate (diltiazem) and rhythm (amiodarone) as prescribed. Patient thankful for the call back and taking the time to explain. No further needs at this time

## 2023-01-01 NOTE — Telephone Encounter (Signed)
Pt states she had a pacemaker put in yesterday and she went back into AFIB this morning and wants to speak with the nurse about this. Please advise

## 2023-01-07 ENCOUNTER — Other Ambulatory Visit: Payer: Self-pay

## 2023-01-07 ENCOUNTER — Emergency Department (HOSPITAL_COMMUNITY)
Admission: EM | Admit: 2023-01-07 | Discharge: 2023-01-07 | Disposition: A | Discharge status: Home / Self Care | Payer: Medicare Other | Attending: Emergency Medicine | Admitting: Emergency Medicine

## 2023-01-07 ENCOUNTER — Emergency Department (HOSPITAL_COMMUNITY): Payer: Medicare Other

## 2023-01-07 ENCOUNTER — Encounter (HOSPITAL_COMMUNITY): Payer: Self-pay | Admitting: Emergency Medicine

## 2023-01-07 DIAGNOSIS — Z7901 Long term (current) use of anticoagulants: Secondary | ICD-10-CM | POA: Insufficient documentation

## 2023-01-07 DIAGNOSIS — R079 Chest pain, unspecified: Secondary | ICD-10-CM | POA: Diagnosis not present

## 2023-01-07 DIAGNOSIS — R0789 Other chest pain: Secondary | ICD-10-CM | POA: Diagnosis not present

## 2023-01-07 DIAGNOSIS — R110 Nausea: Secondary | ICD-10-CM | POA: Diagnosis not present

## 2023-01-07 DIAGNOSIS — I700 Atherosclerosis of aorta: Secondary | ICD-10-CM | POA: Diagnosis not present

## 2023-01-07 DIAGNOSIS — I4891 Unspecified atrial fibrillation: Secondary | ICD-10-CM | POA: Diagnosis not present

## 2023-01-07 DIAGNOSIS — R002 Palpitations: Secondary | ICD-10-CM | POA: Diagnosis not present

## 2023-01-07 DIAGNOSIS — I4892 Unspecified atrial flutter: Secondary | ICD-10-CM | POA: Diagnosis not present

## 2023-01-07 DIAGNOSIS — R000 Tachycardia, unspecified: Secondary | ICD-10-CM | POA: Diagnosis not present

## 2023-01-07 DIAGNOSIS — Z950 Presence of cardiac pacemaker: Secondary | ICD-10-CM | POA: Diagnosis not present

## 2023-01-07 DIAGNOSIS — I10 Essential (primary) hypertension: Secondary | ICD-10-CM | POA: Diagnosis not present

## 2023-01-07 LAB — CBC WITH DIFFERENTIAL/PLATELET
Abs Immature Granulocytes: 0.01 10*3/uL (ref 0.00–0.07)
Basophils Absolute: 0 10*3/uL (ref 0.0–0.1)
Basophils Relative: 1 %
Eosinophils Absolute: 0.2 10*3/uL (ref 0.0–0.5)
Eosinophils Relative: 5 %
HCT: 29 % — ABNORMAL LOW (ref 36.0–46.0)
Hemoglobin: 8.8 g/dL — ABNORMAL LOW (ref 12.0–15.0)
Immature Granulocytes: 0 %
Lymphocytes Relative: 22 %
Lymphs Abs: 0.7 10*3/uL (ref 0.7–4.0)
MCH: 24.3 pg — ABNORMAL LOW (ref 26.0–34.0)
MCHC: 30.3 g/dL (ref 30.0–36.0)
MCV: 80.1 fL (ref 80.0–100.0)
Monocytes Absolute: 0.4 10*3/uL (ref 0.1–1.0)
Monocytes Relative: 13 %
Neutro Abs: 2 10*3/uL (ref 1.7–7.7)
Neutrophils Relative %: 59 %
Platelets: 227 10*3/uL (ref 150–400)
RBC: 3.62 MIL/uL — ABNORMAL LOW (ref 3.87–5.11)
RDW: 15.1 % (ref 11.5–15.5)
WBC: 3.3 10*3/uL — ABNORMAL LOW (ref 4.0–10.5)
nRBC: 0 % (ref 0.0–0.2)

## 2023-01-07 LAB — TROPONIN I (HIGH SENSITIVITY)
Troponin I (High Sensitivity): 7 ng/L (ref <18)
Troponin I (High Sensitivity): 7 ng/L (ref <18)

## 2023-01-07 LAB — BASIC METABOLIC PANEL
Anion gap: 12 (ref 5–15)
BUN: 10 mg/dL (ref 8–23)
CO2: 19 mmol/L — ABNORMAL LOW (ref 22–32)
Calcium: 8.7 mg/dL — ABNORMAL LOW (ref 8.9–10.3)
Chloride: 104 mmol/L (ref 98–111)
Creatinine, Ser: 0.88 mg/dL (ref 0.44–1.00)
GFR, Estimated: >60 mL/min (ref >60)
Glucose, Bld: 102 mg/dL — ABNORMAL HIGH (ref 70–99)
Potassium: 4 mmol/L (ref 3.5–5.1)
Sodium: 135 mmol/L (ref 135–145)

## 2023-01-07 LAB — MAGNESIUM: Magnesium: 2.1 mg/dL (ref 1.7–2.4)

## 2023-01-07 MED ORDER — DILTIAZEM HCL 30 MG PO TABS
30.0000 mg | ORAL_TABLET | Freq: Once | ORAL | Status: DC
  Filled 2023-01-07: qty 1

## 2023-01-07 MED ORDER — DILTIAZEM HCL ER COATED BEADS 180 MG PO CP24
360.0000 mg | ORAL_CAPSULE | Freq: Once | ORAL | Status: AC
  Administered 2023-01-07: 360 mg via ORAL
  Filled 2023-01-07: qty 2

## 2023-01-07 MED ORDER — SODIUM CHLORIDE 0.9 % IV BOLUS
500.0000 mL | Freq: Once | INTRAVENOUS | Status: AC
  Administered 2023-01-07: 500 mL via INTRAVENOUS

## 2023-01-07 NOTE — ED Notes (Signed)
Pt ambulated to bathroom without assistance

## 2023-01-07 NOTE — ED Notes (Signed)
Got patient into a gown on there monitor did EKG shown to er provider patient is resting with call bell in reach

## 2023-01-07 NOTE — ED Triage Notes (Addendum)
Patient presents via EMS from home for chest pressure since last night at approximately 2300. Per EMS, patient had pacemaker placed on 910/24 and reports soreness at the site. Patient denies dizziness, fever, or chills. Patient reports adequate PO intake. Patient tachycardic 103-105bpm per EMS. Per EMS, patient ambulatory with assist x1 at the scene. Patient reports shortness of breath that started last night with the chest pressure.

## 2023-01-07 NOTE — ED Provider Notes (Signed)
[place] EMERGENCY DEPARTMENT AT [facility] Provider Note   CSN: [id number] Arrival date & time: 01/07/23  4098     History  Chief Complaint  Patient presents with   Chest Pain   Shortness of Breath    Nicole [name] is a 72 y.o. adult.  Patient is a 72 year old female with a past medical history of A-fib on Eliquis with pacemaker placed last week resenting to the emergency department with chest pain and shortness of breath.  The patient states that she went to bed feeling well last night and woke up around 1 AM feeling palpitations and like her heart was racing.  She states that she had associated chest pain and shortness of breath.  She states that her symptoms are still present this morning though have eased off throughout the night.  She states that she has been taking her medications as prescribed and has not missed any doses.  She denies any recent nausea, vomiting or diarrhea, fevers, chills or cough.  She states that she has been going in and out of A-fib multiple times and had the pacemaker placed to prevent this.  The history is provided by the patient.  Chest Pain Associated symptoms: shortness of breath   Shortness of Breath Associated symptoms: chest pain        Home Medications Prior to Admission medications   Medication Sig Start Date End Date Taking? Authorizing Provider  acetaminophen (TYLENOL) 500 MG tablet Take 500 mg by mouth 2 (two) times daily as needed for mild pain, headache, moderate pain or fever.   Yes [provider]  amiodarone (PACERONE) 200 MG tablet Take 1 tablet (200 mg total) by mouth daily. 09/17/22  Yes Mealor, Roberts Gaudy, MD  apixaban (ELIQUIS) 5 MG TABS tablet Take 1 tablet (5 mg total) by mouth 2 (two) times daily. 10/03/22  Yes Fenton, Clint R, PA  butalbital-acetaminophen-caffeine (FIORICET) 50-325-40 MG tablet Take 1 tablet by mouth 3 (three) times daily as needed (migraine). 04/17/22  Yes [provider]   butorphanol (STADOL) 10 MG/ML nasal spray Place 1 spray into the nose every 4 (four) hours as needed for headache or migraine.    Yes [provider]  cetirizine (ZYRTEC) 10 MG tablet Take 10 mg by mouth daily as needed for allergies.   Yes [provider]  clonazePAM (KLONOPIN) 1 MG tablet Take 1 mg by mouth 3 (three) times daily.   Yes [provider]  diltiazem (CARDIZEM CD) 360 MG 24 hr capsule Take 1 capsule (360 mg total) by mouth daily. 12/06/22  Yes Mealor, Roberts Gaudy, MD  diltiazem (CARDIZEM) 30 MG tablet TAKE 1 TABLET EVERY 4 HOURS AS NEEDED FOR HR >100 10/28/22  Yes Fenton, Clint R, PA  ferrous sulfate 324 MG TBEC Take 1 tablet (324 mg total) by mouth daily. 07/22/22  Yes Mealor, Roberts Gaudy, MD  fluticasone-salmeterol (ADVAIR HFA) 119-14 MCG/ACT inhaler Inhale 2 puffs into the lungs 2 (two) times daily as needed (asthma).   Yes [provider]  furosemide (LASIX) 20 MG tablet TAKE 1 TABLET BY MOUTH EVERY DAY AS NEEDED FOR EDEMA 08/19/22  Yes Mealor, Roberts Gaudy, MD  KLOR-CON M20 20 MEQ tablet TAKE 1 TABLET BY MOUTH EVERY DAY 12/16/22  Yes Mealor, Roberts Gaudy, MD  metoprolol tartrate (LOPRESSOR) 25 MG tablet Take 1 tablet (25 mg total) by mouth 2 (two) times daily. Patient taking differently: Take 50 mg by mouth 2 (two) times daily. 12/31/22 12/31/23 Yes Mealor, Roberts Gaudy,  MD  pravastatin (PRAVACHOL) 40 MG tablet Take 40 mg by mouth at bedtime.  11/30/18  Yes [provider]  promethazine (PHENERGAN) 25 MG tablet Take 25 mg by mouth every 6 (six) hours as needed for nausea or vomiting.   Yes [provider]  sertraline (ZOLOFT) 50 MG tablet Take 50 mg by mouth at bedtime. 06/11/21  Yes [provider]  VENTOLIN HFA 108 (90 Base) MCG/ACT inhaler Inhale 2 puffs into the lungs every 6 (six) hours as needed for wheezing or shortness of breath.   Yes [provider]      Allergies    Demerol [meperidine hcl], Pneumococcal vaccines,  Shingrix [zoster vac recomb adjuvanted], Other, and Prednisone    Review of Systems   Review of Systems  Respiratory:  Positive for shortness of breath.   Cardiovascular:  Positive for chest pain.    Physical Exam Updated Vital Signs BP 126/82   Pulse (!) 122   Temp 98.7 F (37.1 C) (Oral)   Resp 11   Ht 5\' 8"  (1.727 m)   Wt 90.7 kg   SpO2 98%   BMI 30.41 kg/m  Physical Exam Vitals and nursing note reviewed.  Constitutional:      General: She is not in acute distress.    Appearance: She is well-developed. She is obese.  HENT:     Head: Normocephalic and atraumatic.  Eyes:     Extraocular Movements: Extraocular movements intact.  Cardiovascular:     Rate and Rhythm: Normal rate and regular rhythm.     Pulses:          Radial pulses are 2+ on the right side and 2+ on the left side.     Heart sounds: Normal heart sounds.  Pulmonary:     Effort: Pulmonary effort is normal.     Breath sounds: Normal breath sounds.  Chest:     Comments: Left chest wall pacemaker site appears well-healing, mild surrounding contusion but no erythema or warmth, Steri-Strips still in place Abdominal:     Palpations: Abdomen is soft.     Tenderness: There is no abdominal tenderness.  Musculoskeletal:        General: Normal range of motion.     Cervical back: Normal range of motion and neck supple.     Right lower leg: No edema.     Left lower leg: No edema.  Skin:    General: Skin is warm and dry.  Neurological:     General: No focal deficit present.     Mental Status: She is alert and oriented to person, place, and time.  Psychiatric:        Mood and Affect: Mood normal.        Behavior: Behavior normal.     ED Results / Procedures / Treatments   Labs (all labs ordered are listed, but only abnormal results are displayed) Labs Reviewed  CBC WITH DIFFERENTIAL/PLATELET - Abnormal; Notable for the following components:      Result Value   WBC 3.3 (*)    RBC 3.62 (*)    Hemoglobin  8.8 (*)    HCT 29.0 (*)    MCH 24.3 (*)    All other components within normal limits  BASIC METABOLIC PANEL - Abnormal; Notable for the following components:   CO2 19 (*)    Glucose, Bld 102 (*)    Calcium 8.7 (*)    All other components within normal limits  MAGNESIUM  TROPONIN I (HIGH  SENSITIVITY)  TROPONIN I (HIGH SENSITIVITY)    EKG EKG Interpretation Date/Time:  Tuesday January 07 2023 13:15:26 EDT Ventricular Rate:  99 PR Interval:  184 QRS Duration:  90 QT Interval:  323 QTC Calculation: 415 R Axis:   93  Text Interpretation: Sinus rhythm Right axis deviation Borderline T abnormalities, anterior leads Since last tracing of earlier today No significant change was found Confirmed by Elayne Snare ([id number]) on 01/07/2023 1:31:47 PM  Radiology DG Chest 2 View  Result Date: 01/07/2023 CLINICAL DATA:  Chest pain. EXAM: CHEST - 2 VIEW COMPARISON:  12/31/2022. FINDINGS: Bilateral lung fields are clear. Bilateral costophrenic angles are clear. Normal cardio-mediastinal silhouette. Aortic arch calcifications noted. There is a left sided 2-lead pacemaker. No acute osseous abnormalities. The soft tissues are within normal limits. IMPRESSION: 1. No active cardiopulmonary disease. 2. Aortic atherosclerosis. Electronically Signed   By: Jules Schick M.D.   On: 01/07/2023 10:47    Procedures Procedures    Medications Ordered in ED Medications  diltiazem (CARDIZEM) tablet 30 mg (has no administration in time range)  sodium chloride 0.9 % bolus 500 mL (0 mLs Intravenous Stopped 01/07/23 1331)  diltiazem (CARDIZEM CD) 24 hr capsule 360 mg (360 mg Oral Given 01/07/23 1432)    ED Course/ Medical Decision Making/ A&P Clinical Course as of 01/07/23 1434  Tue Jan 07, 2023  1131 Mildly low bicarb, labs otherwise at baseline. CXR without acute disease. Initial troponin negative. [VK]  1148 Patient reports still feeling like her heart is "out of rhythm". Still sinus on the monitor. Patient  states she feels as though her heart rate needs to be adjusted on the pacemaker and informed this would need to be done by cardiology and can be done as an outpatient as she is in sinus. Will have repeat trop. [VK]  1303 Patient's HR increased to 120s, repeat EKG performed and she is still in sinus. Reports she did take her metoprolol this morning. [VK]  1331 Rpt troponin normal. [VK]  1419 Upon reassessment, patient appears to be in atrial fibrillation on the monitor, HR between 100-110s. Patient is asymptomatic. Repeat troponin negative. She is due for her after diltiazem and will be given short and long acting. She is otherwise stable for discharge home with outpatient cardiology follow up. [VK]    Clinical Course User Index [VK] Rexford Maus, DO                                 Medical Decision Making This patient presents to the ED with chief complaint(s) of chest pain, palpitations with pertinent past medical history of a fib which further complicates the presenting complaint. The complaint involves an extensive differential diagnosis and also carries with it a high risk of complications and morbidity.    The differential diagnosis includes arrhythmia, anemia, pneumonia, pneumothorax, ACS, pulmonary edema, pleural effusion, pacemaker malfunction, PE unlikely as she only had is in and out single day procedure and has been compliant on her anticoagulants  Additional history obtained: Additional history obtained from N/A Records reviewed previous admission documents and cardiology records  ED Course and Reassessment: On patient's arrival she is hemodynamically stable in no acute distress.  EKG on arrival shows normal sinus rhythm without acute ischemic changes.  The patient will continue to be closely monitored on the cardiac monitor to see if she is going in and out of A-fib and will have labs including troponin  and electrolytes to evaluate for cause of her chest pain and palpitations  and will be closely reassessed.  Independent labs interpretation:  The following labs were independently interpreted: At baseline/within normal range  Independent visualization of imaging: - I independently visualized the following imaging with scope of interpretation limited to determining acute life threatening conditions related to emergency care: Chest x-ray, which revealed no acute disease  Consultation: - Consulted or discussed management/test interpretation w/ external professional: N/A  Consideration for admission or further workup: Patient has no emergent conditions requiring admission or further work-up at this time and is stable for discharge home with cardiology follow-up  Social Determinants of health: N/A    Amount and/or Complexity of Data Reviewed Labs: ordered. Radiology: ordered.  Risk Prescription drug management.          Final Clinical Impression(s) / ED Diagnoses Final diagnoses:  Atrial fib/flutter, transient [facility])  Palpitations    Rx / DC Orders ED Discharge Orders     None         Rexford Maus, DO 01/07/23 1435

## 2023-01-07 NOTE — Discharge Instructions (Signed)
You were seen in the emergency department for your heart palpitations and chest pain.  You were in a normal sinus rhythm for the majority of your ED visit though did go into atrial fibrillation when you are due for your diltiazem.  It is possible that you may be going in and out of A-fib since last night.  We did give you your afternoon diltiazem today as well as an extra dose to slow down your heart rate and you can otherwise continue to take your regular medications as prescribed.  You should call your cardiologist and schedule follow-up appointment in the next few days to have your symptoms rechecked to see if you need any changes to your medications or your pacemaker settings.  You should return to the emergency department if you have significantly worsening chest pain, if you feel like your heart rate is persistently elevated especially above 120, you have severe shortness of breath, you pass out or if you have any other new or concerning symptoms.

## 2023-01-09 ENCOUNTER — Ambulatory Visit: Payer: Medicare Other | Attending: Cardiovascular Disease | Admitting: Cardiovascular Disease

## 2023-01-09 ENCOUNTER — Encounter: Payer: Self-pay | Admitting: Cardiovascular Disease

## 2023-01-09 VITALS — BP 132/86 | HR 116 | Ht 68.0 in | Wt 203.0 lb

## 2023-01-09 DIAGNOSIS — I4819 Other persistent atrial fibrillation: Secondary | ICD-10-CM

## 2023-01-09 LAB — CUP PACEART INCLINIC DEVICE CHECK
Battery Remaining Longevity: 84 mo
Battery Voltage: 3.04 V
Brady Statistic RA Percent Paced: 45 %
Brady Statistic RV Percent Paced: 11 %
Date Time Interrogation Session: 20240919164220
Implantable Lead Connection Status: 753985
Implantable Lead Connection Status: 753985
Implantable Lead Implant Date: 20240910
Implantable Lead Implant Date: 20240910
Implantable Lead Location: 753859
Implantable Lead Location: 753860
Implantable Pulse Generator Implant Date: 20240910
Lead Channel Impedance Value: 450 Ohm
Lead Channel Impedance Value: 475 Ohm
Lead Channel Pacing Threshold Amplitude: 1 V
Lead Channel Pacing Threshold Amplitude: 1 V
Lead Channel Pacing Threshold Pulse Width: 0.5 ms
Lead Channel Pacing Threshold Pulse Width: 0.5 ms
Lead Channel Sensing Intrinsic Amplitude: 12 mV
Lead Channel Sensing Intrinsic Amplitude: 2 mV
Lead Channel Setting Pacing Amplitude: 3.5 V
Lead Channel Setting Pacing Amplitude: 3.5 V
Lead Channel Setting Pacing Pulse Width: 0.5 ms
Lead Channel Setting Sensing Sensitivity: 2 mV
Pulse Gen Model: 2272
Pulse Gen Serial Number: 8196922

## 2023-01-09 MED ORDER — METOPROLOL TARTRATE 100 MG PO TABS
100.0000 mg | ORAL_TABLET | Freq: Two times a day (BID) | ORAL | 3 refills | Status: AC
Start: 2023-01-09 — End: 2023-04-09

## 2023-01-09 MED ORDER — DILTIAZEM HCL 30 MG PO TABS: ORAL_TABLET | ORAL | 1 refills | Status: DC

## 2023-01-09 NOTE — Progress Notes (Signed)
Electrophysiology Office Note:    Date:  01/09/2023   ID:  Nicole [name], DOB [birth year], MRN [id number]  PCP:  Jearld Lesch, MD   [facility] Providers Cardiologist:  None Electrophysiologist:  Maurice Small, MD     Referring MD: Jearld Lesch, MD   History of Present Illness:    Nicole [name] is a 72 y.o. adult with a hx listed below, significant for atrial fibrillation s/p ablation and Tikosyn, referred for arrhythmia management.     She underwent a repeat AF ablation by Dr. Johney Frame in Nov 2021. 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 I reviewed the ECGs from the recent visits. They do appear to show AF rather than an atypical flutter.  She is very hesitant to consider a convergent procedure.  She continued to have frequent episodes of atrial fibrillation and flutter, a few requiring ER visits. She was switched from Tikosyn to amiodarone. She underwent a third ablation on Sep 05, 2022. Only a small amount of touch-up was required to re-isolate the pulmonary veins. During the case, she converted to an atypical flutter that converted with ablation of a line extending from the RSPV to the mitral annulus. However, she has had recurrence of multiple atrial arrhythmias with variable cycle length and activation sequences.  Her loop recorder was removed.  She did well for a time period after the ablation but then began to have recurrences again.  Amiodarone was started and again, she did well for a time period.  She notes now that over the past several months she has had increasing episodes of atrial fibrillation, often times with rapid rates and severe symptoms.    Today, she reports that she is feeling okay.  Her heart rate is 160 bpm in clinic.  She an  episode of palpitations and severe fatigue earlier this week.  Her heart rate was 122 bpm.     EKGs/Labs/Other Studies Reviewed Today:         Recent Labs: 05/13/2022: B Natriuretic Peptide 237.1 09/01/2022: ALT 29; TSH 2.570 01/07/2023: BUN 10; Creatinine, Ser 0.88; Hemoglobin 8.8; Magnesium 2.1; Platelets 227; Potassium 4.0; Sodium 135     Physical Exam:    VS:  BP 132/86   Pulse (!) 116   Ht 5\' 8"  (1.727 m)   Wt 203 lb (92.1 kg)   SpO2 98%   BMI 30.87 kg/m     Wt Readings from Last 3 Encounters:  01/09/23 203 lb (92.1 kg)  01/07/23 200 lb (90.7 kg)  12/31/22 200 lb (90.7 kg)     GEN: Well nourished, well developed in no acute distress CARDIAC: RRR, no murmurs, rubs, gallops Pacemaker in normal state of healing.  Steri-Strips are in place. RESPIRATORY:  Normal work of breathing MUSCULOSKELETAL: no edema    ASSESSMENT & PLAN:    Atrial fibrillation: s/p ablation x3, now on amiodarone. Now having symptomatic recurrence on amiodarone 200mg  daily Continue amiodarone for now She has tachy-brady and is very symptomatic with tachycardia/AF episodes We have exhausted options for rhythm control Will refill diltiazem 30  Increase metoprolol to 100 mg BID pending AVJ Schedule AVJ ablation  We discussed the indication, rationale, logistics, anticipated benefits, and potential risks of the ablation procedure including but not limited to -- bleed at  the groin access site, chest pain, damage to nearby organs such as the diaphragm, lungs, or esophagus, need for a drainage tube, or prolonged hospitalization. I explained that the risk for stroke, heart attack, need for open chest surgery, or even death is very low but not zero. she  expressed understanding and wishes to proceed.    Abbott dual-chamber pacemaker Device interrogated today.  See Paceart for report.  I reviewed the interrogation in detail Normal lead parameters She is undersensing AF and flutter due to flutter  beats full within blanking period. Was shortened today.  Secondary hypercoagulable state:   continue apixaban 5 mg BID         Medication Adjustments/Labs and Tests Ordered: Current medicines are reviewed at length with the patient today.  Concerns regarding medicines are outlined above.  Orders Placed This Encounter  Procedures   EKG 12-Lead   No orders of the defined types were placed in this encounter.    Signed, Maurice Small, MD  01/09/2023 11:11 AM    [facility]

## 2023-01-09 NOTE — Patient Instructions (Addendum)
Medication Instructions:  INCREASE Metoprolol tartrate to 100 mg twice daily (each morning and each evening)  *If you need a refill on your cardiac medications before your next appointment, please call your pharmacy*   Lab Work: CBC today If you have labs (blood work) drawn today and your tests are completely normal, you will receive your results only by: MyChart Message (if you have MyChart) OR A paper copy in the mail If you have any lab test that is abnormal or we need to change your treatment, we will call you to review the results.   Testing/Procedures: AV node ablation Your physician has recommended that you have an ablation. Catheter ablation is a medical procedure used to treat some cardiac arrhythmias (irregular heartbeats). During catheter ablation, a long, thin, flexible tube is put into a blood vessel in your groin (upper thigh), or neck. This tube is called an ablation catheter. It is then guided to your heart through the blood vessel. Radio frequency waves destroy small areas of heart tissue where abnormal heartbeats may cause an arrhythmia to start. Please see the instruction sheet given to you today.   Follow-Up: At [facility], you and your health needs are our priority.  As part of our continuing mission to provide you with exceptional heart care, we have created designated Provider Care Teams.  These Care Teams include your primary Cardiologist (physician) and Advanced Practice Providers (APPs -  Physician Assistants and Nurse Practitioners) who all work together to provide you with the care you need, when you need it.  We recommend signing up for the patient portal called "MyChart".  Sign up information is provided on this After Visit Summary.  MyChart is used to connect with patients for Virtual Visits (Telemedicine).  Patients are able to view lab/test results, encounter notes, upcoming appointments, etc.  Non-urgent messages can be sent to your provider as well.    To learn more about what you can do with MyChart, go to [email or web address].    Your next appointment:   We will set up follow up for you after your ablation  Provider:   York Pellant, MD

## 2023-01-09 NOTE — H&P (View-Only) (Signed)
Electrophysiology Office Note:    Date:  01/09/2023   ID:  [name], DOB [birth year], MRN [id number]  PCP:  Jearld Lesch, MD   [facility] Providers Cardiologist:  None Electrophysiologist:  Maurice Small, MD     Referring MD: Jearld Lesch, MD   History of Present Illness:    Nicole [name] is a 72 y.o. adult with a hx listed below, significant for atrial fibrillation s/p ablation and Tikosyn, referred for arrhythmia management.     She underwent a repeat AF ablation by Dr. Johney Frame in Nov 2021. 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 I reviewed the ECGs from the recent visits. They do appear to show AF rather than an atypical flutter.  She is very hesitant to consider a convergent procedure.  She continued to have frequent episodes of atrial fibrillation and flutter, a few requiring ER visits. She was switched from Tikosyn to amiodarone. She underwent a third ablation on Sep 05, 2022. Only a small amount of touch-up was required to re-isolate the pulmonary veins. During the case, she converted to an atypical flutter that converted with ablation of a line extending from the RSPV to the mitral annulus. However, she has had recurrence of multiple atrial arrhythmias with variable cycle length and activation sequences.  Her loop recorder was removed.  She did well for a time period after the ablation but then began to have recurrences again.  Amiodarone was started and again, she did well for a time period.  She notes now that over the past several months she has had increasing episodes of atrial fibrillation, often times with rapid rates and severe symptoms.    Today, she reports that she is feeling okay.  Her heart rate is 160 bpm in clinic.  She an  episode of palpitations and severe fatigue earlier this week.  Her heart rate was 122 bpm.     EKGs/Labs/Other Studies Reviewed Today:         Recent Labs: 05/13/2022: B Natriuretic Peptide 237.1 09/01/2022: ALT 29; TSH 2.570 01/07/2023: BUN 10; Creatinine, Ser 0.88; Hemoglobin 8.8; Magnesium 2.1; Platelets 227; Potassium 4.0; Sodium 135     Physical Exam:    VS:  BP 132/86   Pulse (!) 116   Ht 5\' 8"  (1.727 m)   Wt 203 lb (92.1 kg)   SpO2 98%   BMI 30.87 kg/m     Wt Readings from Last 3 Encounters:  01/09/23 203 lb (92.1 kg)  01/07/23 200 lb (90.7 kg)  12/31/22 200 lb (90.7 kg)     GEN: Well nourished, well developed in no acute distress CARDIAC: RRR, no murmurs, rubs, gallops Pacemaker in normal state of healing.  Steri-Strips are in place. RESPIRATORY:  Normal work of breathing MUSCULOSKELETAL: no edema    ASSESSMENT & PLAN:    Atrial fibrillation: s/p ablation x3, now on amiodarone. Now having symptomatic recurrence on amiodarone 200mg  daily Continue amiodarone for now She has tachy-brady and is very symptomatic with tachycardia/AF episodes We have exhausted options for rhythm control Will refill diltiazem 30  Increase metoprolol to 100 mg BID pending AVJ Schedule AVJ ablation  We discussed the indication, rationale, logistics, anticipated benefits, and potential risks of the ablation procedure including but not limited to -- bleed at  the groin access site, chest pain, damage to nearby organs such as the diaphragm, lungs, or esophagus, need for a drainage tube, or prolonged hospitalization. I explained that the risk for stroke, heart attack, need for open chest surgery, or even death is very low but not zero. she  expressed understanding and wishes to proceed.    Abbott dual-chamber pacemaker Device interrogated today.  See Paceart for report.  I reviewed the interrogation in detail Normal lead parameters She is undersensing AF and flutter due to flutter  beats full within blanking period. Was shortened today.  Secondary hypercoagulable state:   continue apixaban 5 mg BID         Medication Adjustments/Labs and Tests Ordered: Current medicines are reviewed at length with the patient today.  Concerns regarding medicines are outlined above.  Orders Placed This Encounter  Procedures   EKG 12-Lead   No orders of the defined types were placed in this encounter.    Signed, Maurice Small, MD  01/09/2023 11:11 AM    [facility]

## 2023-01-10 ENCOUNTER — Telehealth: Payer: Self-pay | Admitting: Cardiovascular Disease

## 2023-01-10 LAB — CBC
Hematocrit: 33.4 % — ABNORMAL LOW (ref 34.0–46.6)
Hemoglobin: 9.9 g/dL — ABNORMAL LOW (ref 11.1–15.9)
MCH: 23.3 pg — ABNORMAL LOW (ref 26.6–33.0)
MCHC: 29.6 g/dL — ABNORMAL LOW (ref 31.5–35.7)
MCV: 79 fL (ref 79–97)
Platelets: 367 10*3/uL (ref 150–450)
RBC: 4.24 x10E6/uL (ref 3.77–5.28)
RDW: 14.2 % (ref 11.7–15.4)
WBC: 4.3 10*3/uL (ref 3.4–10.8)

## 2023-01-10 NOTE — Telephone Encounter (Signed)
Pt c/o medication issue:  1. Name of Medication:   amiodarone (PACERONE) 200 MG tablet  metoprolol tartrate (LOPRESSOR) 100 MG tablet   2. How are you currently taking this medication (dosage and times per day)?   3. Are you having a reaction (difficulty breathing--STAT)?   4. What is your medication issue?   Patient wants a call back to confirm how she should be taking these medications.

## 2023-01-10 NOTE — Telephone Encounter (Signed)
I spoke with the pt and answered her questions.

## 2023-01-15 ENCOUNTER — Ambulatory Visit: Payer: Medicare Other | Attending: Cardiology

## 2023-01-15 DIAGNOSIS — I495 Sick sinus syndrome: Secondary | ICD-10-CM | POA: Diagnosis not present

## 2023-01-15 DIAGNOSIS — I4819 Other persistent atrial fibrillation: Secondary | ICD-10-CM

## 2023-01-15 NOTE — Patient Instructions (Signed)
   After Your Pacemaker   Monitor your pacemaker site for redness, swelling, and drainage. Call the device clinic at [phone] if you experience these symptoms or fever/chills.  Your incision was closed with Steri-strips or staples:  You may shower 7 days after your procedure and wash your incision with soap and water. Avoid lotions, ointments, or perfumes over your incision until it is well-healed.  You may use a hot tub or a pool after your wound check appointment if the incision is completely closed.  Do not lift, push or pull greater than 10 pounds with the affected arm until 6 weeks after your procedure. UNTIL AFTER OCTOBER 22ND. There are no other restrictions in arm movement after your wound check appointment.  You may drive, unless driving has been restricted by your healthcare providers.   Remote monitoring is used to monitor your pacemaker from home. This monitoring is scheduled every 91 days by our office. It allows [place] to keep an eye on the functioning of your device to ensure it is working properly. You will routinely see your Electrophysiologist annually (more often if necessary).

## 2023-01-16 LAB — CUP PACEART INCLINIC DEVICE CHECK
Battery Remaining Longevity: 80 mo
Battery Voltage: 3.04 V
Brady Statistic RA Percent Paced: 24 %
Brady Statistic RV Percent Paced: 31 %
Date Time Interrogation Session: 20240925153900
Implantable Lead Connection Status: 753985
Implantable Lead Connection Status: 753985
Implantable Lead Implant Date: 20240910
Implantable Lead Implant Date: 20240910
Implantable Lead Location: 753859
Implantable Lead Location: 753860
Implantable Pulse Generator Implant Date: 20240910
Lead Channel Impedance Value: 412.5 Ohm
Lead Channel Impedance Value: 462.5 Ohm
Lead Channel Pacing Threshold Amplitude: 0.75 V
Lead Channel Pacing Threshold Amplitude: 0.75 V
Lead Channel Pacing Threshold Amplitude: 1 V
Lead Channel Pacing Threshold Amplitude: 1 V
Lead Channel Pacing Threshold Pulse Width: 0.5 ms
Lead Channel Pacing Threshold Pulse Width: 0.5 ms
Lead Channel Pacing Threshold Pulse Width: 0.5 ms
Lead Channel Pacing Threshold Pulse Width: 0.5 ms
Lead Channel Sensing Intrinsic Amplitude: 3.6 mV
Lead Channel Sensing Intrinsic Amplitude: 8.7 mV
Lead Channel Setting Pacing Amplitude: 3.5 V
Lead Channel Setting Pacing Amplitude: 3.5 V
Lead Channel Setting Pacing Pulse Width: 0.5 ms
Lead Channel Setting Sensing Sensitivity: 2 mV
Pulse Gen Model: 2272
Pulse Gen Serial Number: 8196922

## 2023-01-16 NOTE — Progress Notes (Signed)
Wound check appointment. Steri-strips removed. Wound without redness or edema. Incision edges approximated, wound well healed. Normal device function. Thresholds, sensing, and impedances consistent with implant measurements. Device programmed at 3.5V/auto capture programmed on for extra safety margin until 3 month visit. Histogram distribution appropriate for patient and level of activity. 1027 AMS episodes all show AF with some high ventricular rates noted. Patient educated about wound care, arm mobility, lifting restrictions. ROV in 3 months with implanting physician.  1.  Patient scheduled for an AV node ablation on 01/22/23 2.  Increased Metoprolol to 100mg  bid on 9/19. 3.  Still some undersensing of AF, delaying MS.  Decrease in PVAB from 130-154ms.

## 2023-01-21 ENCOUNTER — Telehealth: Payer: Self-pay | Admitting: Cardiovascular Disease

## 2023-01-21 NOTE — Pre-Procedure Instructions (Signed)
Instructed patient on the following items: Arrival time 1230 Nothing to eat or drink after midnight No meds AM of procedure Responsible person to drive you home and stay with you for 24 hrs  Have you missed any doses of anti-coagulant Eliquis- takes twice a day, hasn't missed any doses.  Don't take dose in the morning.

## 2023-01-21 NOTE — Telephone Encounter (Signed)
Patient c/o Palpitations:  STAT if patient reporting lightheadedness, shortness of breath, or chest pain  How long have you had palpitations/irregular HR/ Afib? Patient states she went into a-fib at 4am this morning. Are you having the symptoms now? Patient states she is going in and out of it.   Are you currently experiencing lightheadedness, SOB or CP? no  Do you have a history of afib (atrial fibrillation) or irregular heart rhythm? Yes   Have you checked your BP or HR? (document readings if available): 112  Are you experiencing any other symptoms? no

## 2023-01-21 NOTE — Telephone Encounter (Signed)
Pt awoke early this morning in afib, around 4am.   HR 118 bpm. She took her morning medications - amiodarone, eliquis, Lopressor.   HRs continued to avg 110s-120s -- she took a PRN Diltiazem, waited an hour and took another. States HRs are currently going up and down  (using her kardia mobile to monitor). BP 138/82 Asymptomatic currently. Reviewed w/ Dr. Nelly Laurence. Pt advised not much we can do until procedure tomorrow. Advised she can take up to 2 more PRN Diltiazem today, not closer than 4 hours together. Advised to call office back if symptoms begin and/or HRs worsen. Patient verbalized understanding and agreeable to plan.

## 2023-01-22 ENCOUNTER — Ambulatory Visit (HOSPITAL_COMMUNITY): Payer: Medicare Other

## 2023-01-22 ENCOUNTER — Ambulatory Visit (HOSPITAL_COMMUNITY)
Admission: RE | Disposition: A | Discharge status: Home / Self Care | Payer: Self-pay | Source: Home / Self Care | Attending: Cardiovascular Disease

## 2023-01-22 ENCOUNTER — Other Ambulatory Visit: Payer: Self-pay

## 2023-01-22 ENCOUNTER — Ambulatory Visit (HOSPITAL_COMMUNITY)
Admission: RE | Admit: 2023-01-22 | Discharge: 2023-01-22 | Disposition: A | Discharge status: Home / Self Care | Payer: Medicare Other | Attending: Cardiovascular Disease | Admitting: Cardiovascular Disease

## 2023-01-22 DIAGNOSIS — J45909 Unspecified asthma, uncomplicated: Secondary | ICD-10-CM | POA: Diagnosis not present

## 2023-01-22 DIAGNOSIS — E785 Hyperlipidemia, unspecified: Secondary | ICD-10-CM | POA: Diagnosis not present

## 2023-01-22 DIAGNOSIS — Z950 Presence of cardiac pacemaker: Secondary | ICD-10-CM | POA: Diagnosis not present

## 2023-01-22 DIAGNOSIS — I10 Essential (primary) hypertension: Secondary | ICD-10-CM | POA: Diagnosis not present

## 2023-01-22 DIAGNOSIS — Z7901 Long term (current) use of anticoagulants: Secondary | ICD-10-CM | POA: Insufficient documentation

## 2023-01-22 DIAGNOSIS — I4821 Permanent atrial fibrillation: Secondary | ICD-10-CM | POA: Insufficient documentation

## 2023-01-22 DIAGNOSIS — I4891 Unspecified atrial fibrillation: Secondary | ICD-10-CM

## 2023-01-22 DIAGNOSIS — Z79899 Other long term (current) drug therapy: Secondary | ICD-10-CM | POA: Insufficient documentation

## 2023-01-22 DIAGNOSIS — I495 Sick sinus syndrome: Secondary | ICD-10-CM | POA: Diagnosis not present

## 2023-01-22 DIAGNOSIS — D6869 Other thrombophilia: Secondary | ICD-10-CM | POA: Insufficient documentation

## 2023-01-22 HISTORY — PX: AV NODE ABLATION: EP1193

## 2023-01-22 SURGERY — AV NODE ABLATION
Anesthesia: Monitor Anesthesia Care

## 2023-01-22 MED ORDER — PHENYLEPHRINE 80 MCG/ML (10ML) SYRINGE FOR IV PUSH (FOR BLOOD PRESSURE SUPPORT)
PREFILLED_SYRINGE | INTRAVENOUS | Status: DC | PRN
  Administered 2023-01-22 (×2): 80 ug via INTRAVENOUS

## 2023-01-22 MED ORDER — PHENYLEPHRINE HCL-NACL 20-0.9 MG/250ML-% IV SOLN
INTRAVENOUS | Status: DC | PRN
  Administered 2023-01-22: 40 ug/min via INTRAVENOUS

## 2023-01-22 MED ORDER — ACETAMINOPHEN 325 MG PO TABS: 650.0000 mg | ORAL_TABLET | ORAL | Status: DC | PRN

## 2023-01-22 MED ORDER — MIDAZOLAM HCL 2 MG/2ML IJ SOLN
INTRAMUSCULAR | Status: DC | PRN
  Administered 2023-01-22: 2 mg via INTRAVENOUS

## 2023-01-22 MED ORDER — SODIUM CHLORIDE 0.9 % IV SOLN: INTRAVENOUS | Status: DC

## 2023-01-22 MED ORDER — BUPIVACAINE HCL (PF) 0.25 % IJ SOLN
INTRAMUSCULAR | Status: AC
  Filled 2023-01-22: qty 30

## 2023-01-22 MED ORDER — ONDANSETRON HCL 4 MG/2ML IJ SOLN
INTRAMUSCULAR | Status: DC | PRN
  Administered 2023-01-22: 4 mg via INTRAVENOUS

## 2023-01-22 MED ORDER — CEFAZOLIN SODIUM-DEXTROSE 2-4 GM/100ML-% IV SOLN
2.0000 g | Freq: Once | INTRAVENOUS | Status: AC
  Administered 2023-01-22: 2 g via INTRAVENOUS

## 2023-01-22 MED ORDER — GLYCOPYRROLATE 0.2 MG/ML IJ SOLN
INTRAMUSCULAR | Status: DC | PRN
Start: 2023-01-22 — End: 2023-01-22
  Administered 2023-01-22: .2 mg via INTRAVENOUS

## 2023-01-22 MED ORDER — METOPROLOL SUCCINATE ER 100 MG PO TB24
100.0000 mg | ORAL_TABLET | Freq: Every day | ORAL | 11 refills | Status: DC
Start: 2023-01-22 — End: 2023-04-09

## 2023-01-22 MED ORDER — CEFAZOLIN SODIUM-DEXTROSE 2-4 GM/100ML-% IV SOLN
INTRAVENOUS | Status: AC
  Filled 2023-01-22: qty 100

## 2023-01-22 MED ORDER — ACETAMINOPHEN 10 MG/ML IV SOLN
INTRAVENOUS | Status: DC | PRN
Start: 2023-01-22 — End: 2023-01-22
  Administered 2023-01-22: 1000 mg via INTRAVENOUS

## 2023-01-22 MED ORDER — HEPARIN SODIUM (PORCINE) 1000 UNIT/ML IJ SOLN
INTRAMUSCULAR | Status: AC
  Filled 2023-01-22: qty 10

## 2023-01-22 MED ORDER — SODIUM CHLORIDE 0.9 % IV SOLN: 250.0000 mL | INTRAVENOUS | Status: DC | PRN

## 2023-01-22 MED ORDER — ACETAMINOPHEN 10 MG/ML IV SOLN
INTRAVENOUS | Status: AC
  Filled 2023-01-22: qty 100

## 2023-01-22 MED ORDER — BUPIVACAINE HCL (PF) 0.25 % IJ SOLN
INTRAMUSCULAR | Status: DC | PRN
  Administered 2023-01-22: 25 mL

## 2023-01-22 MED ORDER — HEPARIN (PORCINE) IN NACL 1000-0.9 UT/500ML-% IV SOLN
INTRAVENOUS | Status: DC | PRN
  Administered 2023-01-22: 500 mL

## 2023-01-22 MED ORDER — FENTANYL CITRATE (PF) 250 MCG/5ML IJ SOLN
INTRAMUSCULAR | Status: DC | PRN
  Administered 2023-01-22: 50 ug via INTRAVENOUS
  Administered 2023-01-22 (×2): 25 ug via INTRAVENOUS
  Administered 2023-01-22: 50 ug via INTRAVENOUS
  Administered 2023-01-22 (×2): 25 ug via INTRAVENOUS

## 2023-01-22 MED ORDER — DEXAMETHASONE SODIUM PHOSPHATE 4 MG/ML IJ SOLN
INTRAMUSCULAR | Status: DC | PRN
  Administered 2023-01-22: 10 mg via INTRAVENOUS

## 2023-01-22 MED ORDER — SODIUM CHLORIDE 0.9% FLUSH: 3.0000 mL | INTRAVENOUS | Status: DC | PRN

## 2023-01-22 MED ORDER — AMIODARONE HCL 150 MG/3ML IV SOLN
INTRAVENOUS | Status: AC
  Filled 2023-01-22: qty 3

## 2023-01-22 MED ORDER — DEXMEDETOMIDINE HCL IN NACL 80 MCG/20ML IV SOLN
INTRAVENOUS | Status: DC | PRN
Start: 2023-01-22 — End: 2023-01-22
  Administered 2023-01-22: 12 ug via INTRAVENOUS

## 2023-01-22 MED ORDER — ONDANSETRON HCL 4 MG/2ML IJ SOLN: 4.0000 mg | Freq: Four times a day (QID) | INTRAMUSCULAR | Status: DC | PRN

## 2023-01-22 MED ORDER — AMIODARONE IV BOLUS ONLY 150 MG/100ML
INTRAVENOUS | Status: DC | PRN
Start: 2023-01-22 — End: 2023-01-22
  Administered 2023-01-22: 150 mg via INTRAVENOUS

## 2023-01-22 MED ORDER — PROPOFOL 500 MG/50ML IV EMUL
INTRAVENOUS | Status: DC | PRN
Start: 2023-01-22 — End: 2023-01-22
  Administered 2023-01-22: 50 ug/kg/min via INTRAVENOUS

## 2023-01-22 SURGICAL SUPPLY — 14 items
BAG SNAP BAND KOVER 36X36 (MISCELLANEOUS) IMPLANT
CATH GE 8FR SOUNDSTAR (CATHETERS) IMPLANT
CATH SMTCH THERMOCOOL SF FJ (CATHETERS) IMPLANT
CATH SOUNDSTAR ECO 8FR (CATHETERS) IMPLANT
COVER SWIFTLINK CONNECTOR (BAG) IMPLANT
DEVICE CLOSURE MYNXGRIP 6/7F (Vascular Products) IMPLANT
PACK EP LATEX FREE (CUSTOM PROCEDURE TRAY) ×1
PACK EP LF (CUSTOM PROCEDURE TRAY) ×1 IMPLANT
PAD DEFIB RADIO PHYSIO CONN (PAD) ×1 IMPLANT
PATCH CARTO3 (PAD) IMPLANT
SHEATH PINNACLE 8F 10CM (SHEATH) IMPLANT
SHEATH PINNACLE 9F 10CM (SHEATH) IMPLANT
SHEATH PROBE COVER 6X72 (BAG) IMPLANT
TUBING SMART ABLATE COOLFLOW (TUBING) IMPLANT

## 2023-01-22 NOTE — Interval H&P Note (Signed)
History and Physical Interval Note:  01/22/2023 12:59 PM  Nicole [name]  has presented today for surgery, with the diagnosis of Tachy Brady; afib.  The various methods of treatment have been discussed with the patient and family. After consideration of risks, benefits and other options for treatment, the patient has consented to  Procedure(s): AV NODE ABLATION (N/A) as a surgical intervention.  The patient's history has been reviewed, patient examined, no change in status, stable for surgery.  I have reviewed the patient's chart and labs.  Questions were answered to the patient's satisfaction.     Roberts Gaudy Vance Hochmuth

## 2023-01-22 NOTE — Transfer of Care (Signed)
Immediate Anesthesia Transfer of Care Note  Patient: Nicole [name]  Procedure(s) Performed: AV NODE ABLATION  Patient Location: PACU and Cath Lab  Anesthesia Type:MAC  Level of Consciousness: awake  Airway & Oxygen Therapy: Patient Spontanous Breathing  Post-op Assessment: Report given to RN  Post vital signs: Reviewed and stable  Last Vitals:  Vitals Value Taken Time  BP    Temp    Pulse    Resp    SpO2      Last Pain:  Vitals:   01/22/23 1303  TempSrc: Oral  PainSc:          Complications: No notable events documented.

## 2023-01-22 NOTE — Anesthesia Postprocedure Evaluation (Signed)
Anesthesia Post Note  Patient: Nicole [name]  Procedure(s) Performed: AV NODE ABLATION     Patient location during evaluation: PACU Anesthesia Type: MAC Level of consciousness: awake and alert Pain management: pain level controlled Vital Signs Assessment: post-procedure vital signs reviewed and stable Respiratory status: spontaneous breathing, nonlabored ventilation, respiratory function stable and patient connected to nasal cannula oxygen Cardiovascular status: blood pressure returned to baseline and stable Postop Assessment: no apparent nausea or vomiting Anesthetic complications: no   No notable events documented.  Last Vitals:  Vitals:   01/22/23 1627 01/22/23 1630  BP: 119/76 117/76  Pulse: 80 80  Resp: 15 15  Temp: 36.5 C   SpO2: 94% 95%    Last Pain:  Vitals:   01/22/23 1627  TempSrc: Temporal  PainSc: 0-No pain                 [place]

## 2023-01-22 NOTE — Discharge Instructions (Signed)

## 2023-01-22 NOTE — Anesthesia Preprocedure Evaluation (Addendum)
Anesthesia Evaluation  Patient identified by MRN, date of birth, ID band Patient awake    Reviewed: Allergy & Precautions, NPO status , Patient's Chart, lab work & pertinent test results  Airway Mallampati: II  TM Distance: >3 FB Neck ROM: Full    Dental  (+) Dental Advisory Given, Missing, Loose   Pulmonary asthma    Pulmonary exam normal breath sounds clear to auscultation       Cardiovascular hypertension, Normal cardiovascular exam+ dysrhythmias Atrial Fibrillation + pacemaker  Rhythm:Regular Rate:Normal  Echo 12/06/21: 1. Left ventricular ejection fraction, by estimation, is 60 to 65%. The  left ventricle has normal function. The left ventricle has no regional  wall motion abnormalities. There is mild asymmetric left ventricular  hypertrophy of the septal segment. Left  ventricular diastolic parameters are consistent with Grade II diastolic  dysfunction (pseudonormalization). Elevated left ventricular end-diastolic  pressure.   2. Right ventricular systolic function is normal. The right ventricular  size is normal. There is normal pulmonary artery systolic pressure.   3. The mitral valve is normal in structure. Trivial mitral valve  regurgitation. No evidence of mitral stenosis.   4. The aortic valve is tricuspid. Aortic valve regurgitation is trivial.  No aortic stenosis is present.   5. Aortic dilatation noted. There is mild dilatation of the aortic root,  measuring 38 mm. There is borderline dilatation of the ascending aorta,  measuring 36 mm.   6. The inferior vena cava is normal in size with greater than 50%  respiratory variability, suggesting right atrial pressure of 3 mmHg.      Neuro/Psych  Headaches PSYCHIATRIC DISORDERS Anxiety        GI/Hepatic Neg liver ROS,GERD  ,,  Endo/Other  negative endocrine ROS    Renal/GU negative Renal ROS     Musculoskeletal  (+) Arthritis ,    Abdominal   Peds   Hematology  (+) Blood dyscrasia, anemia   Anesthesia Other Findings Day of surgery medications reviewed with the patient.  Reproductive/Obstetrics                             Anesthesia Physical Anesthesia Plan  ASA: 3  Anesthesia Plan: MAC   Post-op Pain Management: Ofirmev IV (intra-op)*   Induction: Intravenous  PONV Risk Score and Plan: 2 and Dexamethasone, Ondansetron and TIVA  Airway Management Planned: Natural Airway and Simple Face Mask  Additional Equipment:   Intra-op Plan:   Post-operative Plan:   Informed Consent: I have reviewed the patients History and Physical, chart, labs and discussed the procedure including the risks, benefits and alternatives for the proposed anesthesia with the patient or authorized representative who has indicated his/her understanding and acceptance.     Dental advisory given  Plan Discussed with: CRNA  Anesthesia Plan Comments:         Anesthesia Quick Evaluation

## 2023-01-22 NOTE — Progress Notes (Signed)
Patient walked to the bathroom without difficulties, right groin site level 0, clean dry and intact.

## 2023-01-23 ENCOUNTER — Encounter (HOSPITAL_COMMUNITY): Payer: Self-pay | Admitting: Cardiovascular Disease

## 2023-01-23 MED FILL — Heparin Sodium (Porcine) Inj 1000 Unit/ML: INTRAMUSCULAR | Qty: 10 | Status: AC

## 2023-01-28 ENCOUNTER — Telehealth: Payer: Self-pay | Admitting: Cardiovascular Disease

## 2023-01-28 NOTE — Telephone Encounter (Signed)
Patient is calling because she is scheduled to have Basal Cell removed on 10/15 the day before her appointment. Patient would like to make sure it is safe for her to have. Patient would like to know if she is safe to have a mammogram this month as well. Please advise.

## 2023-01-28 NOTE — Telephone Encounter (Signed)
Spoke with patient, ok to proceed with having her dermatologist remove a basal cell carcinoma on her forehead on 10/15. Ablation was completed on 01/22/23, no cardiac concerns since ablation. Confirmed upcoming appointments and alright to move forward with scheduling a mammogram. No further needs at this time.

## 2023-01-30 DIAGNOSIS — R600 Localized edema: Secondary | ICD-10-CM | POA: Diagnosis not present

## 2023-01-30 DIAGNOSIS — L03116 Cellulitis of left lower limb: Secondary | ICD-10-CM | POA: Diagnosis not present

## 2023-02-05 ENCOUNTER — Ambulatory Visit: Payer: Medicare Other | Attending: Cardiovascular Disease

## 2023-02-05 DIAGNOSIS — I495 Sick sinus syndrome: Secondary | ICD-10-CM | POA: Diagnosis present

## 2023-02-05 DIAGNOSIS — I4892 Unspecified atrial flutter: Secondary | ICD-10-CM

## 2023-02-05 LAB — CUP PACEART INCLINIC DEVICE CHECK
Battery Remaining Longevity: 73 mo
Battery Voltage: 3.01 V
Brady Statistic RA Percent Paced: 78 %
Brady Statistic RV Percent Paced: 99.99 %
Date Time Interrogation Session: 20241016090051
Implantable Lead Connection Status: 753985
Implantable Lead Connection Status: 753985
Implantable Lead Implant Date: 20240910
Implantable Lead Implant Date: 20240910
Implantable Lead Location: 753859
Implantable Lead Location: 753860
Implantable Pulse Generator Implant Date: 20240910
Lead Channel Impedance Value: 425 Ohm
Lead Channel Impedance Value: 487.5 Ohm
Lead Channel Pacing Threshold Amplitude: 1 V
Lead Channel Pacing Threshold Amplitude: 1 V
Lead Channel Pacing Threshold Pulse Width: 0.5 ms
Lead Channel Pacing Threshold Pulse Width: 0.5 ms
Lead Channel Sensing Intrinsic Amplitude: 0 mV
Lead Channel Sensing Intrinsic Amplitude: 4.3 mV
Lead Channel Sensing Intrinsic Amplitude: 9.9 mV
Lead Channel Setting Pacing Amplitude: 1 V
Lead Channel Setting Pacing Amplitude: 3.5 V
Lead Channel Setting Pacing Pulse Width: 0.5 ms
Lead Channel Setting Sensing Sensitivity: 2 mV
Pulse Gen Model: 2272
Pulse Gen Serial Number: 8196922

## 2023-02-05 NOTE — Patient Instructions (Signed)
Follow up scheduled

## 2023-02-05 NOTE — Progress Notes (Signed)
Pacemaker check in clinic. Normal device function. Thresholds, sensing, impedances consistent with previous measurements. Device programmed to maximize longevity. Known afib status post av node ablation. Device programmed at appropriate safety margins. Histogram flat reprogrammed DDIR 70. Device programmed to optimize intrinsic conduction. Patient enrolled in remote follow-up. Patient education completed.

## 2023-02-07 ENCOUNTER — Telehealth: Payer: Self-pay | Admitting: Cardiovascular Disease

## 2023-02-07 NOTE — Telephone Encounter (Signed)
Pt c/o medication issue:  1. Name of Medication: Diltiazem 360 mg  2. How are you currently taking this medication (dosage and times per day)?  N/A  3. Are you having a reaction (difficulty breathing--STAT)? No  4. What is your medication issue? Pt states that Dr. Nelly Laurence d/c'd medication but pharmacy continues to fill. Pt would like a callback regarding this matter. Please advise

## 2023-02-07 NOTE — Telephone Encounter (Signed)
Pt reports that nurse told her she would call her pharmacy and tell them she was no longer taking Diltiazem, but they keep trying to refill it.  States she told them she is no longer taking that medication but states they told her that doctors office will need to confirm that. Informed that I would call them and let them know she is no longer taking. Called [facility] and advised to remove Diltiazem for pt.  They d/c this medication from further refills.

## 2023-02-10 DIAGNOSIS — L03116 Cellulitis of left lower limb: Secondary | ICD-10-CM | POA: Diagnosis not present

## 2023-02-10 DIAGNOSIS — R600 Localized edema: Secondary | ICD-10-CM | POA: Diagnosis not present

## 2023-02-17 DIAGNOSIS — Z1231 Encounter for screening mammogram for malignant neoplasm of breast: Secondary | ICD-10-CM | POA: Diagnosis not present

## 2023-02-17 DIAGNOSIS — Z23 Encounter for immunization: Secondary | ICD-10-CM | POA: Diagnosis not present

## 2023-02-24 NOTE — Progress Notes (Unsigned)
  Electrophysiology Office Note:   ID:  Nicole [name], DOB 1951/01/19, MRN [id number]  Primary Cardiologist: None Electrophysiologist: Maurice Small, MD  {Click to update primary MD,subspecialty MD or APP then REFRESH:1}    History of Present Illness:   Nicole [name] is a 72 y.o. adult with h/o Atrial fibrillation despite ablation x 3, tachy brady s/p PPM, anxiety, anemia, HLD, and HTN seen today for routine electrophysiology followup.   Pt underwent AF ablation 08/2022 and continued to have symptomatic AF with RVR as well as bradycardia despite attempts with amiodarone and Tikosyn. Ultimately underwent PPM implantation 12/31/2022 and AV nodal ablation 01/22/2023  Seen post AV nodal ablation 02/05/2023 and Rate response turned on with LRL of 70.  Since last being seen in our clinic the patient reports doing ***.  she denies chest pain, palpitations, dyspnea, PND, orthopnea, nausea, vomiting, dizziness, syncope, edema, weight gain, or early satiety.   Review of systems complete and found to be negative unless listed in HPI.   EP Information / Studies Reviewed:    EKG is not ordered today.        PPM Interrogation-  reviewed in detail today,  See PACEART report.  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   Wt Readings from Last 3 Encounters:  01/22/23 200 lb (90.7 kg)  01/09/23 203 lb (92.1 kg)  01/07/23 200 lb (90.7 kg)     GEN: Well nourished, well developed in no acute distress NECK: No JVD; No carotid bruits CARDIAC: {EPRHYTHM:28826}, no murmurs, rubs, gallops RESPIRATORY:  Clear to auscultation without rales, wheezing or rhonchi  ABDOMEN: Soft, non-tender, non-distended EXTREMITIES:  No edema; No deformity    ASSESSMENT AND PLAN:    Tachy-Brady syndrome s/p Abbott PPM  Persistent AF s/p AV nodal ablation 01/2023 Normal PPM function See Pace Art report LRL *** Continue Eliquis 5 mg BID for CHA2DS2/VASc of at least 3    HTN Stable on current regimen    {Click here to Review PMH, Prob List, Meds, Allergies, SHx, FHx  :1}   Disposition:   Follow up with {EPPROVIDERS:28135} {EPFOLLOW UP:28173}  Signed, Graciella Freer, PA-C

## 2023-02-25 ENCOUNTER — Ambulatory Visit: Payer: Medicare Other | Attending: Student | Admitting: Student

## 2023-02-25 ENCOUNTER — Encounter: Payer: Self-pay | Admitting: Student

## 2023-02-25 VITALS — BP 126/68 | HR 73 | Ht 68.0 in | Wt 201.0 lb

## 2023-02-25 DIAGNOSIS — I10 Essential (primary) hypertension: Secondary | ICD-10-CM | POA: Diagnosis not present

## 2023-02-25 DIAGNOSIS — I495 Sick sinus syndrome: Secondary | ICD-10-CM

## 2023-02-25 DIAGNOSIS — I4819 Other persistent atrial fibrillation: Secondary | ICD-10-CM | POA: Diagnosis not present

## 2023-02-25 LAB — CUP PACEART INCLINIC DEVICE CHECK
Battery Remaining Longevity: 93 mo
Battery Voltage: 3.01 V
Brady Statistic RA Percent Paced: 33 %
Brady Statistic RV Percent Paced: 99.98 %
Date Time Interrogation Session: 20241105132237
Implantable Lead Connection Status: 753985
Implantable Lead Connection Status: 753985
Implantable Lead Implant Date: 20240910
Implantable Lead Implant Date: 20240910
Implantable Lead Location: 753859
Implantable Lead Location: 753860
Implantable Pulse Generator Implant Date: 20240910
Lead Channel Impedance Value: 425 Ohm
Lead Channel Impedance Value: 537.5 Ohm
Lead Channel Pacing Threshold Amplitude: 0.75 V
Lead Channel Pacing Threshold Amplitude: 0.75 V
Lead Channel Pacing Threshold Amplitude: 0.75 V
Lead Channel Pacing Threshold Pulse Width: 0.5 ms
Lead Channel Pacing Threshold Pulse Width: 0.5 ms
Lead Channel Pacing Threshold Pulse Width: 0.5 ms
Lead Channel Sensing Intrinsic Amplitude: 4.9 mV
Lead Channel Sensing Intrinsic Amplitude: 5.9 mV
Lead Channel Setting Pacing Amplitude: 1 V
Lead Channel Setting Pacing Amplitude: 3.5 V
Lead Channel Setting Pacing Pulse Width: 0.5 ms
Lead Channel Setting Sensing Sensitivity: 2 mV
Pulse Gen Model: 2272
Pulse Gen Serial Number: 8196922

## 2023-02-25 MED ORDER — APIXABAN 5 MG PO TABS: 5.0000 mg | ORAL_TABLET | Freq: Two times a day (BID) | ORAL | 0 refills | Status: DC

## 2023-02-25 NOTE — Patient Instructions (Signed)
Medication Instructions:  Your physician recommends that you continue on your current medications as directed. Please refer to the Current Medication list given to you today.  *If you need a refill on your cardiac medications before your next appointment, please call your pharmacy*  Lab Work: None ordered If you have labs (blood work) drawn today and your tests are completely normal, you will receive your results only by: MyChart Message (if you have MyChart) OR A paper copy in the mail If you have any lab test that is abnormal or we need to change your treatment, we will call you to review the results.  Follow-Up: At [facility] [place], you and your health needs are our priority.  As part of our continuing mission to provide you with exceptional heart care, we have created designated Provider Care Teams.  These Care Teams include your primary Cardiologist (physician) and Advanced Practice Providers (APPs -  Physician Assistants and Nurse Practitioners) who all work together to provide you with the care you need, when you need it.  Your next appointment:   As scheduled with Dr Nelly Laurence

## 2023-03-04 DIAGNOSIS — C44319 Basal cell carcinoma of skin of other parts of face: Secondary | ICD-10-CM | POA: Diagnosis not present

## 2023-03-07 DIAGNOSIS — L905 Scar conditions and fibrosis of skin: Secondary | ICD-10-CM | POA: Diagnosis not present

## 2023-04-01 ENCOUNTER — Ambulatory Visit (INDEPENDENT_AMBULATORY_CARE_PROVIDER_SITE_OTHER): Payer: Medicare Other

## 2023-04-01 DIAGNOSIS — I495 Sick sinus syndrome: Secondary | ICD-10-CM

## 2023-04-02 LAB — CUP PACEART REMOTE DEVICE CHECK
Battery Remaining Longevity: 79 mo
Battery Remaining Percentage: 95.5 %
Battery Voltage: 3.01 V
Brady Statistic AP VP Percent: 42 %
Brady Statistic AP VS Percent: 1 %
Brady Statistic AS VP Percent: 58 %
Brady Statistic AS VS Percent: 1 %
Brady Statistic RA Percent Paced: 43 %
Brady Statistic RV Percent Paced: 99 %
Date Time Interrogation Session: 20241210040013
Implantable Lead Connection Status: 753985
Implantable Lead Connection Status: 753985
Implantable Lead Implant Date: 20240910
Implantable Lead Implant Date: 20240910
Implantable Lead Location: 753859
Implantable Lead Location: 753860
Implantable Pulse Generator Implant Date: 20240910
Lead Channel Impedance Value: 450 Ohm
Lead Channel Impedance Value: 480 Ohm
Lead Channel Pacing Threshold Amplitude: 0.75 V
Lead Channel Pacing Threshold Amplitude: 0.75 V
Lead Channel Pacing Threshold Pulse Width: 0.5 ms
Lead Channel Pacing Threshold Pulse Width: 0.5 ms
Lead Channel Sensing Intrinsic Amplitude: 4 mV
Lead Channel Sensing Intrinsic Amplitude: 5.9 mV
Lead Channel Setting Pacing Amplitude: 1 V
Lead Channel Setting Pacing Amplitude: 3.5 V
Lead Channel Setting Pacing Pulse Width: 0.5 ms
Lead Channel Setting Sensing Sensitivity: 2 mV
Pulse Gen Model: 2272
Pulse Gen Serial Number: 8196922

## 2023-04-03 ENCOUNTER — Encounter: Payer: Medicare Other | Admitting: Cardiovascular Disease

## 2023-04-09 ENCOUNTER — Telehealth: Payer: Self-pay | Admitting: Student

## 2023-04-09 ENCOUNTER — Other Ambulatory Visit: Payer: Self-pay | Admitting: Physician Assistant

## 2023-04-09 MED ORDER — METOPROLOL SUCCINATE ER 100 MG PO TB24: 100.0000 mg | ORAL_TABLET | Freq: Every day | ORAL | 3 refills | Status: DC

## 2023-04-09 NOTE — Telephone Encounter (Signed)
Pt's medication was sent to pt's pharmacy as requested. Confirmation received.  °

## 2023-04-09 NOTE — Telephone Encounter (Signed)
*  STAT* If patient is at the pharmacy, call can be transferred to refill team.   1. Which medications need to be refilled? (please list name of each medication and dose if known) metoprolol succinate (TOPROL XL) 100 MG 24 hr tablet    4. Which pharmacy/location (including street and city if local pharmacy) is medication to be sent to? [id number] - [place], [place] - [address]     5. Do they need a 30 day or 90 day supply? 90  Pt called for a refill but was told it was denied. Called pharmacy and they said this medication was inactivated. Please advise, pt is completely out

## 2023-04-21 ENCOUNTER — Telehealth: Payer: Self-pay | Admitting: Cardiovascular Disease

## 2023-04-21 NOTE — Telephone Encounter (Signed)
Spoke with patient concerning ferrous sulfate use, instructed patient to check with her PCP since this was started after she had a GI bleed over a year ago per pt. No further needs at this time

## 2023-04-21 NOTE — Telephone Encounter (Signed)
  Pt c/o medication issue:  1. Name of Medication: ferrous sulfate 324 MG TBEC   2. How are you currently taking this medication (dosage and times per day)?   Take 1 tablet (324 mg total) by mouth daily.    3. Are you having a reaction (difficulty breathing--STAT)? No   4. What is your medication issue? Pt would like to ask Dr. Nelly Laurence if she need to continue taking this medication

## 2023-04-24 ENCOUNTER — Ambulatory Visit: Payer: Medicare Other | Admitting: Cardiovascular Disease

## 2023-05-01 ENCOUNTER — Encounter: Payer: Medicare Other | Admitting: Student

## 2023-05-08 ENCOUNTER — Encounter: Payer: Self-pay | Admitting: Cardiovascular Disease

## 2023-05-08 ENCOUNTER — Ambulatory Visit: Payer: Medicare Other | Attending: Cardiovascular Disease | Admitting: Cardiovascular Disease

## 2023-05-08 VITALS — BP 140/84 | HR 70 | Ht 68.0 in | Wt 201.0 lb

## 2023-05-08 DIAGNOSIS — I495 Sick sinus syndrome: Secondary | ICD-10-CM | POA: Insufficient documentation

## 2023-05-08 DIAGNOSIS — R55 Syncope and collapse: Secondary | ICD-10-CM | POA: Diagnosis not present

## 2023-05-08 DIAGNOSIS — I4719 Other supraventricular tachycardia: Secondary | ICD-10-CM | POA: Insufficient documentation

## 2023-05-08 DIAGNOSIS — I4892 Unspecified atrial flutter: Secondary | ICD-10-CM | POA: Insufficient documentation

## 2023-05-08 DIAGNOSIS — I4819 Other persistent atrial fibrillation: Secondary | ICD-10-CM | POA: Insufficient documentation

## 2023-05-08 LAB — CUP PACEART INCLINIC DEVICE CHECK
Battery Remaining Longevity: 111 mo
Battery Voltage: 2.98 V
Brady Statistic RA Percent Paced: 32 %
Brady Statistic RV Percent Paced: 99.97 %
Date Time Interrogation Session: 20250116102510
Implantable Lead Connection Status: 753985
Implantable Lead Connection Status: 753985
Implantable Lead Implant Date: 20240910
Implantable Lead Implant Date: 20240910
Implantable Lead Location: 753859
Implantable Lead Location: 753860
Implantable Pulse Generator Implant Date: 20240910
Lead Channel Impedance Value: 462.5 Ohm
Lead Channel Impedance Value: 525 Ohm
Lead Channel Pacing Threshold Amplitude: 0.75 V
Lead Channel Pacing Threshold Amplitude: 0.75 V
Lead Channel Pacing Threshold Amplitude: 0.75 V
Lead Channel Pacing Threshold Amplitude: 0.75 V
Lead Channel Pacing Threshold Pulse Width: 0.5 ms
Lead Channel Pacing Threshold Pulse Width: 0.5 ms
Lead Channel Pacing Threshold Pulse Width: 0.5 ms
Lead Channel Pacing Threshold Pulse Width: 0.5 ms
Lead Channel Sensing Intrinsic Amplitude: 5 mV
Lead Channel Sensing Intrinsic Amplitude: 9.9 mV
Lead Channel Setting Pacing Amplitude: 1 V
Lead Channel Setting Pacing Amplitude: 2 V
Lead Channel Setting Pacing Pulse Width: 0.5 ms
Lead Channel Setting Sensing Sensitivity: 2 mV
Pulse Gen Model: 2272
Pulse Gen Serial Number: 8196922

## 2023-05-08 NOTE — Progress Notes (Signed)
Electrophysiology Office Note:    Date:  05/08/2023   ID:  Ryanna Nadal, DOB March 05, 1951, MRN [id number]  PCP:  Jearld Lesch, MD   [facility] Providers Cardiologist:  None Electrophysiologist:  Maurice Small, MD     Referring MD: Jearld Lesch, MD   History of Present Illness:    Nicole [name] is a 73 y.o. adult with a hx listed below, significant for atrial fibrillation s/p ablation and Tikosyn, referred for arrhythmia management.     She underwent a repeat AF ablation by Dr. Johney Frame in Nov 2021. 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 I reviewed the ECGs from the recent visits. They do appear to show AF rather than an atypical flutter.  She is very hesitant to consider a convergent procedure.  She continued to have frequent episodes of atrial fibrillation and flutter, a few requiring ER visits. She was switched from Tikosyn to amiodarone. She underwent a third ablation on Sep 05, 2022. Only a small amount of touch-up was required to re-isolate the pulmonary veins. During the case, she converted to an atypical flutter that converted with ablation of a line extending from the RSPV to the mitral annulus. However, she has had recurrence of multiple atrial arrhythmias with variable cycle length and activation sequences.  Her loop recorder was removed.  She did well for a time period after the ablation but then began to have recurrences again.  Amiodarone was started and again, she did well for a time period.  She notes now that over the past several months she has had increasing episodes of atrial fibrillation, often times with rapid rates and severe symptoms.    She underwent AV node ablation on January 22, 2023. Since the procedure, she has been doing  much better overall.  She has had a few episodes where she felt her heart racing with minimal, if any, exertion     EKGs/Labs/Other Studies Reviewed Today:         Recent Labs: 05/13/2022: B Natriuretic Peptide 237.1 09/01/2022: ALT 29; TSH 2.570 01/07/2023: BUN 10; Creatinine, Ser 0.88; Magnesium 2.1; Potassium 4.0; Sodium 135 01/09/2023: Hemoglobin 9.9; Platelets 367     Physical Exam:    VS:  BP (!) 140/84 (BP Location: Right Arm, Patient Position: Sitting, Cuff Size: Large)   Pulse 70   Ht 5\' 8"  (1.727 m)   Wt 201 lb (91.2 kg)   SpO2 96%   BMI 30.56 kg/m     Wt Readings from Last 3 Encounters:  05/08/23 201 lb (91.2 kg)  02/25/23 201 lb (91.2 kg)  01/22/23 200 lb (90.7 kg)     GEN: Well nourished, well developed in no acute distress CARDIAC: RRR, no murmurs, rubs, gallops Pacemaker in normal state of healing.  Steri-Strips are in place. RESPIRATORY:  Normal work of breathing MUSCULOSKELETAL: no edema    ASSESSMENT & PLAN:    Atrial fibrillation: s/p ablation x3, now on amiodarone. Now having symptomatic recurrence on amiodarone 200mg  daily She has tachy-brady and is very symptomatic with tachycardia/AF episodes We have exhausted options for rhythm control S/p  AVJ ablation   Abbott dual-chamber pacemaker Device interrogated today.  See Paceart for report.  I reviewed the interrogation in detail Normal lead parameters She was undersensing AF and  flutter due to flutter beats within blanking period. She was programmed DDIR and not tracking her atrial rhythm; she also was having elevated rates with minimal exertion -- we programmed her to DDD today  Secondary hypercoagulable state:   continue apixaban 5 mg BID         Medication Adjustments/Labs and Tests Ordered: Current medicines are reviewed at length with the patient today.  Concerns regarding medicines are outlined above.  Orders Placed This Encounter  Procedures   EKG 12-Lead   No orders of the  defined types were placed in this encounter.    Signed, Maurice Small, MD  05/08/2023 8:33 AM    [facility]

## 2023-05-08 NOTE — Patient Instructions (Signed)
Medication Instructions:  Your physician recommends that you continue on your current medications as directed. Please refer to the Current Medication list given to you today. *If you need a refill on your cardiac medications before your next appointment, please call your pharmacy*   Follow-Up: At [facility], you and your health needs are our priority.  As part of our continuing mission to provide you with exceptional heart care, we have created designated Provider Care Teams.  These Care Teams include your primary Cardiologist (physician) and Advanced Practice Providers (APPs -  Physician Assistants and Nurse Practitioners) who all work together to provide you with the care you need, when you need it.  We recommend signing up for the patient portal called "MyChart".  Sign up information is provided on this After Visit Summary.  MyChart is used to connect with patients for Virtual Visits (Telemedicine).  Patients are able to view lab/test results, encounter notes, upcoming appointments, etc.  Non-urgent messages can be sent to your provider as well.   To learn more about what you can do with MyChart, go to [email or web address].    Your next appointment:   3 month(s)  Provider:   You will see one of the following Advanced Practice Providers on your designated Care Team:   Francis Dowse, Charlott Holler [address]" [place], [place] Sherie Don, NP Canary Brim, NP

## 2023-05-12 NOTE — Addendum Note (Signed)
Addended by: Elease Etienne A on: 05/12/2023 01:45 PM   Modules accepted: Orders

## 2023-05-12 NOTE — Progress Notes (Signed)
Remote pacemaker transmission.   

## 2023-05-15 ENCOUNTER — Telehealth: Payer: Self-pay | Admitting: Cardiovascular Disease

## 2023-05-15 ENCOUNTER — Telehealth: Payer: Self-pay

## 2023-05-15 NOTE — Telephone Encounter (Signed)
Patient reports of increased fatigue, palpitations and shortness of breath. Reviewed with Otilio Saber, PA who agreed changes that were made 05/08/23 would not affect patients symptoms. Patient recommended AF clinic referral. Pt agreeable to plan and states she has seen AF clinic before. Would like to see Alphonzo Severance, PA.

## 2023-05-15 NOTE — Telephone Encounter (Signed)
Patient is not home to send a manual transmission, when she returns home she will send then RN will f/u.

## 2023-05-15 NOTE — Telephone Encounter (Signed)
Pt calling back, requesting cb

## 2023-05-15 NOTE — Telephone Encounter (Signed)
Pt called in stating that she has not been feeling well since program changes made on 05/08/2023. Pt would like a call back from a nurse

## 2023-05-15 NOTE — Telephone Encounter (Signed)
Patient called stating she wants to speak to nurse about her pacer maker.  She states she spoke to the nurse in the device clinic and they told her she needs to go the A-fib clinic.  She doesn't understand why needs to go there.  She wants to speak to Dr. Nelly Laurence or his nurse. She said she needs it explained to her as to why she needs to go there.

## 2023-05-15 NOTE — Telephone Encounter (Signed)
Spoke with patient, she was referred to AF clinic to be seen but would like to see one of our providers here at [facility] with EP - appointment scheduled on 05/27/23 with Canary Brim.

## 2023-05-17 ENCOUNTER — Encounter: Payer: Self-pay | Admitting: Cardiovascular Disease

## 2023-05-25 ENCOUNTER — Other Ambulatory Visit: Payer: Self-pay | Admitting: Student

## 2023-05-25 DIAGNOSIS — I4819 Other persistent atrial fibrillation: Secondary | ICD-10-CM

## 2023-05-26 NOTE — Progress Notes (Signed)
 Electrophysiology Office Note:   Date:  05/27/2023  ID:  Nicole [name], DOB 10-Jan-1951, MRN [id number]  Primary Cardiologist: None Primary Heart Failure: None Electrophysiologist: Eulas FORBES Furbish, MD       History of Present Illness:   Nicole [name] is a 73 y.o. adult with h/o permanent AF s/p AV node ablation, HTN, HLD seen today for routine electrophysiology followup.   Patient underwent AV node ablation in 01/2023 by Dr. Furbish.  Pt called 05/14/22 with reports of increased fatigue, palpitations, & shortness of breath. She was referred to the [facility] but later requested to come to [facility] office.    Since last being seen in our clinic the patient reports she has been feeling short of breath at rest and with exertion.  She feels her heart racing at times.  States she has not had energy to do the things she enjoys.   She denies chest pain, palpitations, PND, orthopnea, nausea, vomiting, dizziness, syncope, edema, weight gain, or early satiety.   Review of systems complete and found to be negative unless listed in HPI.   EP Information / Studies Reviewed:    EKG is not ordered today. EKG from 05/07/22 reviewed which showed AS with prolonged PR, VP 70 bpm      PPM Interrogation-  reviewed in detail today,  See PACEART report.  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 The patient's score is based upon: CHF History: 0 HTN History: 1 Diabetes History: 0 Stroke History: 0 Vascular Disease History: 0 Age Score: 1 Gender Score: 1             Physical Exam:   VS:  BP 138/88   Pulse 88   Ht 5' 8 (1.727 m)   Wt 202 lb (91.6 kg)    SpO2 98%   BMI 30.71 kg/m    Wt Readings from Last 3 Encounters:  05/27/23 202 lb (91.6 kg)  05/08/23 201 lb (91.2 kg)  02/25/23 201 lb (91.2 kg)     GEN: Well nourished, well developed in no acute distress NECK: No JVD; No carotid bruits CARDIAC: Regular rate and rhythm (VP), no murmurs, rubs, gallops RESPIRATORY:  Clear to auscultation without rales, wheezing or rhonchi  ABDOMEN: Soft, non-tender, non-distended EXTREMITIES:  No edema; No deformity   ASSESSMENT AND PLAN:    Uncontrolled atrial arrhyhtmia s/p AV node ablation s/p Abbott PPM  -Normal PPM function -See Pace Art report -continue Toprol  100mg  daily  -noted base rate for mode switch was at 90, reduced to 70 bpm with Industry support > may be part of her racing feeling & fatigue.  Changed AT detection to 140. Changed to DDI 70 with Industry -pt in/out of AF on device, AMS 69% -plan to bring back in 1 month to consider rate reduction to 60 bpm  Secondary Hypercoagulable State  -continue Eliquis  5mg  BID, dose reviewed and appropriate by age/wt  Fatigue, SOB, Palpitations -initially concerned may be related to prolonged PR.  However base rate for mode switch was still programmed at 90 (and LRL was lower), she was still tracking in the 90's consistently >  this may explain symptoms -changes above made with industry support, if better in 1 month, reduce rate to 60 bpm  Hypertension  -elevated in clinic > asked patient to keep record of BP & bring back to visit to discuss.  If consistently >140 systolic, asked to be notified and will start additional BP agent  Disposition:   Follow up with EP APP in 4 weeks  Signed, Daphne Barrack, NP-C, AGACNP-BC [facility] - Electrophysiology  05/27/2023, 1:38 PM

## 2023-05-26 NOTE — Telephone Encounter (Signed)
Eliquis 5mg  refill request received. Patient is 73 years old, weight-91.2kg, Crea-0.88 on 01/07/23, Diagnosis-Afib, and last seen by Dr. Nelly Laurence on 05/08/23. Dose is appropriate based on dosing criteria. Will send in refill to requested pharmacy.

## 2023-05-27 ENCOUNTER — Ambulatory Visit: Payer: Medicare Other | Attending: Pulmonary Disease | Admitting: Pulmonary Disease

## 2023-05-27 ENCOUNTER — Encounter: Payer: Self-pay | Admitting: Pulmonary Disease

## 2023-05-27 VITALS — BP 138/88 | HR 88 | Ht 68.0 in | Wt 202.0 lb

## 2023-05-27 DIAGNOSIS — D6869 Other thrombophilia: Secondary | ICD-10-CM

## 2023-05-27 DIAGNOSIS — I495 Sick sinus syndrome: Secondary | ICD-10-CM

## 2023-05-27 DIAGNOSIS — I4819 Other persistent atrial fibrillation: Secondary | ICD-10-CM

## 2023-05-27 LAB — CUP PACEART INCLINIC DEVICE CHECK
Battery Remaining Longevity: 111 mo
Battery Voltage: 2.98 V
Brady Statistic RA Percent Paced: 8.9 %
Brady Statistic RV Percent Paced: 99.9 %
Date Time Interrogation Session: 20250204133425
Implantable Lead Connection Status: 753985
Implantable Lead Connection Status: 753985
Implantable Lead Implant Date: 20240910
Implantable Lead Implant Date: 20240910
Implantable Lead Location: 753859
Implantable Lead Location: 753860
Implantable Pulse Generator Implant Date: 20240910
Lead Channel Impedance Value: 450 Ohm
Lead Channel Impedance Value: 475 Ohm
Lead Channel Pacing Threshold Amplitude: 0.625 V
Lead Channel Pacing Threshold Pulse Width: 0.5 ms
Lead Channel Sensing Intrinsic Amplitude: 12 mV
Lead Channel Sensing Intrinsic Amplitude: 3.9 mV
Lead Channel Setting Pacing Amplitude: 0.875
Lead Channel Setting Pacing Amplitude: 2 V
Lead Channel Setting Pacing Pulse Width: 0.5 ms
Lead Channel Setting Sensing Sensitivity: 4 mV
Pulse Gen Model: 2272
Pulse Gen Serial Number: 8196922

## 2023-05-27 NOTE — Patient Instructions (Signed)
 Medication Instructions:  Your physician recommends that you continue on your current medications as directed. Please refer to the Current Medication list given to you today.  *If you need a refill on your cardiac medications before your next appointment, please call your pharmacy*  Lab Work: None ordered If you have labs (blood work) drawn today and your tests are completely normal, you will receive your results only by: MyChart Message (if you have MyChart) OR A paper copy in the mail If you have any lab test that is abnormal or we need to change your treatment, we will call you to review the results.  Follow-Up: At [facility], you and your health needs are our priority.  As part of our continuing mission to provide you with exceptional heart care, we have created designated Provider Care Teams.  These Care Teams include your primary Cardiologist (physician) and Advanced Practice Providers (APPs -  Physician Assistants and Nurse Practitioners) who all work together to provide you with the care you need, when you need it.  Your next appointment:   1 month(s)  Provider:   Canary Brim, NP

## 2023-05-29 DIAGNOSIS — L578 Other skin changes due to chronic exposure to nonionizing radiation: Secondary | ICD-10-CM | POA: Diagnosis not present

## 2023-05-29 DIAGNOSIS — L2089 Other atopic dermatitis: Secondary | ICD-10-CM | POA: Diagnosis not present

## 2023-05-29 DIAGNOSIS — X32XXXS Exposure to sunlight, sequela: Secondary | ICD-10-CM | POA: Diagnosis not present

## 2023-05-29 DIAGNOSIS — L218 Other seborrheic dermatitis: Secondary | ICD-10-CM | POA: Diagnosis not present

## 2023-05-29 DIAGNOSIS — Z859 Personal history of malignant neoplasm, unspecified: Secondary | ICD-10-CM | POA: Diagnosis not present

## 2023-05-29 DIAGNOSIS — L821 Other seborrheic keratosis: Secondary | ICD-10-CM | POA: Diagnosis not present

## 2023-05-29 DIAGNOSIS — L814 Other melanin hyperpigmentation: Secondary | ICD-10-CM | POA: Diagnosis not present

## 2023-05-29 DIAGNOSIS — W908XXS Exposure to other nonionizing radiation, sequela: Secondary | ICD-10-CM | POA: Diagnosis not present

## 2023-05-29 DIAGNOSIS — Z85828 Personal history of other malignant neoplasm of skin: Secondary | ICD-10-CM | POA: Diagnosis not present

## 2023-05-29 DIAGNOSIS — L570 Actinic keratosis: Secondary | ICD-10-CM | POA: Diagnosis not present

## 2023-06-03 ENCOUNTER — Encounter: Payer: Self-pay | Admitting: Cardiovascular Disease

## 2023-06-13 ENCOUNTER — Encounter: Payer: Medicare Other | Admitting: Cardiovascular Disease

## 2023-06-20 ENCOUNTER — Telehealth: Payer: Self-pay

## 2023-06-20 NOTE — Telephone Encounter (Signed)
 Rescheduled for 3/7 w/ AT.

## 2023-06-20 NOTE — Telephone Encounter (Signed)
 LMTCB

## 2023-06-20 NOTE — Telephone Encounter (Signed)
 Pt requesting to be seen for to eval if we can lower rate from 70 to 60. Seen in clinic 05/27/23 by Canary Brim NP. Per Veleta Miners NP note 1 month f/u. Patient has 3/28 appointment. Was calling to tell patient we could see what next available appointment is with EP APP for eval and reprogramming as appropriate.

## 2023-06-25 NOTE — Progress Notes (Signed)
  Electrophysiology Office Note:   ID:  Ruberta Holck, DOB 03-24-1951, MRN [id number]  Primary Cardiologist: None Electrophysiologist: Maurice Small, MD      History of Present Illness:   Nicole [name] is a 73 y.o. adult with h/o AF despite multiple ablations and AAD, s/p AV nodal ablation, tachy-brady s/p PPM, anxiety, anemia, HLD, and HTN seen today for routine electrophysiology followup.   Since last being seen in our clinic the patient reports doing OK. She does have some fatigue when walking up stairs, but working on it. Continues to go in and out of AF/AFL. AMS turned off, and had been programmed DDI with rate response off. Otherwise, she denies chest pain, palpitations, dyspnea, PND, orthopnea, nausea, vomiting, dizziness, syncope, edema, weight gain, or early satiety.   Review of systems complete and found to be negative unless listed in HPI.   EP Information / Studies Reviewed:    EKG is not ordered today. EKG from 05/08/2023 reviewed which showed V paced rhythm at 70 bpm       PPM Interrogation-  reviewed in detail today,  See PACEART report.  Arrhythmia/Device History Abbott Dual Chamber PPM 12/31/2022 for tachy-brady   S/p AV nodal ablation 01/22/2023  S/p AF ablation x 3 - most recent 08/2022  Medtronic - ILR - Carelink-RRT 12/31/21   Physical Exam:   VS:  BP 136/88   Pulse 68   Ht 5\' 8"  (1.727 m)   Wt 206 lb 12.8 oz (93.8 kg)   SpO2 97%   BMI 31.44 kg/m    Wt Readings from Last 3 Encounters:  06/27/23 206 lb 12.8 oz (93.8 kg)  05/27/23 202 lb (91.6 kg)  05/08/23 201 lb (91.2 kg)     GEN: No acute distress  NECK: No JVD; No carotid bruits CARDIAC: Regular rate and rhythm (V paced), no murmurs, rubs, gallops RESPIRATORY:  Clear to auscultation without rales, wheezing or rhonchi  ABDOMEN: Soft, non-tender, non-distended EXTREMITIES:  No edema; No deformity   ASSESSMENT AND PLAN:    Tachy-Brady syndrome s/p Abbott PPM  Persistent AF s/p AV nodal  ablation Normal PPM function See Pace Art report No changes today LRL decreased to 60 and Rate response turned back on with persistent atrial arrhythmias. Depending on response can turn back up to 70 at next visit.  Left at [facility] for now. Can see back in 3 months and try DDDR if keeps going in and out of AF Continue eliquis 5 mg BID for CHA2DS2VASc of at least 3  HTN Stable on current regimen   Secondary hypercoagulable state Pt on Eliquis as above    Disposition:   Follow up with EP APP in 3 months  Signed, Graciella Freer, PA-C

## 2023-06-26 ENCOUNTER — Encounter (HOSPITAL_BASED_OUTPATIENT_CLINIC_OR_DEPARTMENT_OTHER): Payer: Self-pay

## 2023-06-27 ENCOUNTER — Encounter: Payer: Self-pay | Admitting: Student

## 2023-06-27 ENCOUNTER — Ambulatory Visit: Payer: Medicare Other | Attending: Student | Admitting: Student

## 2023-06-27 VITALS — BP 136/88 | HR 68 | Ht 68.0 in | Wt 206.8 lb

## 2023-06-27 DIAGNOSIS — I10 Essential (primary) hypertension: Secondary | ICD-10-CM | POA: Insufficient documentation

## 2023-06-27 DIAGNOSIS — I4819 Other persistent atrial fibrillation: Secondary | ICD-10-CM | POA: Diagnosis not present

## 2023-06-27 DIAGNOSIS — I495 Sick sinus syndrome: Secondary | ICD-10-CM | POA: Diagnosis not present

## 2023-06-27 DIAGNOSIS — D6869 Other thrombophilia: Secondary | ICD-10-CM | POA: Insufficient documentation

## 2023-06-27 LAB — CUP PACEART INCLINIC DEVICE CHECK
Battery Remaining Longevity: 116 mo
Battery Voltage: 3.01 V
Brady Statistic RA Percent Paced: 21 %
Brady Statistic RV Percent Paced: 99.98 %
Date Time Interrogation Session: 20250307090419
Implantable Lead Connection Status: 753985
Implantable Lead Connection Status: 753985
Implantable Lead Implant Date: 20240910
Implantable Lead Implant Date: 20240910
Implantable Lead Location: 753859
Implantable Lead Location: 753860
Implantable Pulse Generator Implant Date: 20240910
Lead Channel Impedance Value: 450 Ohm
Lead Channel Impedance Value: 487.5 Ohm
Lead Channel Pacing Threshold Amplitude: 0.625 V
Lead Channel Pacing Threshold Pulse Width: 0.5 ms
Lead Channel Sensing Intrinsic Amplitude: 12 mV
Lead Channel Sensing Intrinsic Amplitude: 3.7 mV
Lead Channel Setting Pacing Amplitude: 0.875
Lead Channel Setting Pacing Amplitude: 2 V
Lead Channel Setting Pacing Pulse Width: 0.5 ms
Lead Channel Setting Sensing Sensitivity: 4 mV
Pulse Gen Model: 2272
Pulse Gen Serial Number: 8196922

## 2023-06-27 NOTE — Patient Instructions (Signed)
 Medication Instructions:  No Changes *If you need a refill on your cardiac medications before your next appointment, please call your pharmacy*   Lab Work: No Labs If you have labs (blood work) drawn today and your tests are completely normal, you will receive your results only by: MyChart Message (if you have MyChart) OR A paper copy in the mail If you have any lab test that is abnormal or we need to change your treatment, we will call you to review the results.   Testing/Procedures: No Testing   Follow-Up: At [facility], you and your health needs are our priority.  As part of our continuing mission to provide you with exceptional heart care, we have created designated Provider Care Teams.  These Care Teams include your primary Cardiologist (physician) and Advanced Practice Providers (APPs -  Physician Assistants and Nurse Practitioners) who all work together to provide you with the care you need, when you need it.  We recommend signing up for the patient portal called "MyChart".  Sign up information is provided on this After Visit Summary.  MyChart is used to connect with patients for Virtual Visits (Telemedicine).  Patients are able to view lab/test results, encounter notes, upcoming appointments, etc.  Non-urgent messages can be sent to your provider as well.   To learn more about what you can do with MyChart, go to [email or web address].    Your next appointment:   3 month(s)  Provider:   Graciella Freer, PA-C        Other Instructions   1st Floor: - Lobby - Registration  - Pharmacy  - Lab - Cafe  2nd Floor: - PV Lab - Diagnostic Testing (echo, CT, nuclear med)  3rd Floor: - Vacant  4th Floor: - TCTS (cardiothoracic surgery) - AFib Clinic - Structural Heart Clinic - Vascular Surgery  - Vascular Ultrasound  5th Floor: - HeartCare Cardiology (general and EP) - Clinical Pharmacy for coumadin, hypertension, lipid, weight-loss medications,  and med management appointments    Valet parking services will be available as well.

## 2023-07-01 ENCOUNTER — Ambulatory Visit: Payer: Medicare Other

## 2023-07-01 DIAGNOSIS — I495 Sick sinus syndrome: Secondary | ICD-10-CM

## 2023-07-02 LAB — CUP PACEART REMOTE DEVICE CHECK
Battery Remaining Longevity: 118 mo
Battery Remaining Percentage: 95 %
Battery Voltage: 3.01 V
Brady Statistic AP VP Percent: 1.3 %
Brady Statistic AP VS Percent: 0 %
Brady Statistic AS VP Percent: 99 %
Brady Statistic AS VS Percent: 1 %
Brady Statistic RA Percent Paced: 1.4 %
Brady Statistic RV Percent Paced: 99 %
Date Time Interrogation Session: 20250311050013
Implantable Lead Connection Status: 753985
Implantable Lead Connection Status: 753985
Implantable Lead Implant Date: 20240910
Implantable Lead Implant Date: 20240910
Implantable Lead Location: 753859
Implantable Lead Location: 753860
Implantable Pulse Generator Implant Date: 20240910
Lead Channel Impedance Value: 430 Ohm
Lead Channel Impedance Value: 480 Ohm
Lead Channel Pacing Threshold Amplitude: 0.625 V
Lead Channel Pacing Threshold Amplitude: 0.75 V
Lead Channel Pacing Threshold Pulse Width: 0.5 ms
Lead Channel Pacing Threshold Pulse Width: 0.5 ms
Lead Channel Sensing Intrinsic Amplitude: 12 mV
Lead Channel Sensing Intrinsic Amplitude: 3.7 mV
Lead Channel Setting Pacing Amplitude: 0.875
Lead Channel Setting Pacing Amplitude: 2 V
Lead Channel Setting Pacing Pulse Width: 0.5 ms
Lead Channel Setting Sensing Sensitivity: 4 mV
Pulse Gen Model: 2272
Pulse Gen Serial Number: 8196922

## 2023-07-03 ENCOUNTER — Encounter: Payer: Self-pay | Admitting: Cardiovascular Disease

## 2023-07-09 ENCOUNTER — Encounter: Payer: Self-pay | Admitting: Cardiovascular Disease

## 2023-07-16 ENCOUNTER — Other Ambulatory Visit: Payer: Self-pay | Admitting: Cardiovascular Disease

## 2023-07-16 DIAGNOSIS — I4819 Other persistent atrial fibrillation: Secondary | ICD-10-CM

## 2023-07-18 ENCOUNTER — Ambulatory Visit: Payer: Medicare Other | Admitting: Pulmonary Disease

## 2023-07-22 ENCOUNTER — Encounter: Payer: Medicare Other | Admitting: Student

## 2023-07-26 ENCOUNTER — Other Ambulatory Visit: Payer: Self-pay | Admitting: Cardiovascular Disease

## 2023-08-19 NOTE — Progress Notes (Signed)
 Remote pacemaker transmission.

## 2023-08-19 NOTE — Addendum Note (Signed)
 Addended by: Lott Rouleau A on: 08/19/2023 11:44 AM   Modules accepted: Orders

## 2023-09-01 DIAGNOSIS — D649 Anemia, unspecified: Secondary | ICD-10-CM | POA: Diagnosis not present

## 2023-09-01 DIAGNOSIS — G4459 Other complicated headache syndrome: Secondary | ICD-10-CM | POA: Diagnosis not present

## 2023-09-01 DIAGNOSIS — I10 Essential (primary) hypertension: Secondary | ICD-10-CM | POA: Diagnosis not present

## 2023-09-01 DIAGNOSIS — L089 Local infection of the skin and subcutaneous tissue, unspecified: Secondary | ICD-10-CM | POA: Diagnosis not present

## 2023-09-01 DIAGNOSIS — M179 Osteoarthritis of knee, unspecified: Secondary | ICD-10-CM | POA: Diagnosis not present

## 2023-09-01 DIAGNOSIS — E785 Hyperlipidemia, unspecified: Secondary | ICD-10-CM | POA: Diagnosis not present

## 2023-09-01 DIAGNOSIS — I4891 Unspecified atrial fibrillation: Secondary | ICD-10-CM | POA: Diagnosis not present

## 2023-09-29 ENCOUNTER — Encounter: Admitting: Student

## 2023-09-29 LAB — CUP PACEART REMOTE DEVICE CHECK
Battery Remaining Longevity: 107 mo
Battery Remaining Percentage: 92 %
Battery Voltage: 2.99 V
Brady Statistic AP VP Percent: 39 %
Brady Statistic AP VS Percent: 1 %
Brady Statistic AS VP Percent: 61 %
Brady Statistic AS VS Percent: 1 %
Brady Statistic RA Percent Paced: 40 %
Brady Statistic RV Percent Paced: 99 %
Date Time Interrogation Session: 20250609020015
Implantable Lead Connection Status: 753985
Implantable Lead Connection Status: 753985
Implantable Lead Implant Date: 20240910
Implantable Lead Implant Date: 20240910
Implantable Lead Location: 753859
Implantable Lead Location: 753860
Implantable Pulse Generator Implant Date: 20240910
Lead Channel Impedance Value: 430 Ohm
Lead Channel Impedance Value: 460 Ohm
Lead Channel Pacing Threshold Amplitude: 0.75 V
Lead Channel Pacing Threshold Amplitude: 0.75 V
Lead Channel Pacing Threshold Pulse Width: 0.5 ms
Lead Channel Pacing Threshold Pulse Width: 0.5 ms
Lead Channel Sensing Intrinsic Amplitude: 12 mV
Lead Channel Sensing Intrinsic Amplitude: 4 mV
Lead Channel Setting Pacing Amplitude: 1 V
Lead Channel Setting Pacing Amplitude: 2 V
Lead Channel Setting Pacing Pulse Width: 0.5 ms
Lead Channel Setting Sensing Sensitivity: 4 mV
Pulse Gen Model: 2272
Pulse Gen Serial Number: 8196922

## 2023-09-29 NOTE — Progress Notes (Unsigned)
 Cardiology Office Note:  .   Date:  09/29/2023  ID:  Nicole [name], DOB 1951-01-04, MRN [id number] PCP: Delbert Feathers, MD  [facility] Providers Cardiologist:  None Electrophysiologist:  Efraim Grange, MD {  History of Present Illness: .   Nicole [name] is a 73 y.o. adult w/PMHx of  HTN, HLD, migraine HAs AFib, Flutter, and a number of atrial tachycardia  She saw Dr. Arlester Ladd 05/08/23, doing well post AV node ablation, under sensed AFlutter with waves falling in blanking, DDIR > DDD  She saw Brandi 05/27/23 c/o increased fatigue, DOE, and feeling her heart race,  AMS rate was at 90bpm > 70bpm AT detection adjusted DDI  She saw Jonelle Neri 06/27/23, seems doing better, still some DOE with stairs DDIR, LRL reduced to 60 Discussed perhaps DDDR if not better at next visit    Today's visit is scheduled as a 3 mo f/u ROS:   Generally about the same Gets winded easier then she would like, doesn't have the same exertional capacity Has days at a time that she feels tired, also has days that she feels quite well No CP, no palpitations or cardiac awareness No near syncope or syncope No bleeding or signs of bleeding    Device information MDT ILR implanted 02/04/2018 > removed 2024 Abbott dual chamber PPM implanted 12/31/2022 (RV lead is LB/septal position) AV node ablation 01/22/23  Arrhythmia/AAD hx Remote flecainide > amiodarone  > Tikosyn  started Sept 2019 >> failed >> back to amiodarone  >> ultimately AV node ablation  Reports of Afib ablation by Dr. Harwood Lingo (record unavailable) reported by the patient Jan and May 2019 Referred to Dr. Nunzio Belch June 2019 PVI and L atrial ablation 2021 (noted as well  Multiple atypical atrial flutter circuits observed with atrial pacing during isuprel  infusion.  These were variable and not suitable for 3D mapping or ablation) third ablation on Sep 05, 2022  During the case, she converted to an atypical flutter that converted with ablation of a  line extending from the RSPV to the mitral annulus. However, she has had recurrence of multiple atrial arrhythmias with variable cycle length and activation sequences.  AV node  01/22/23  Studies Reviewed: Aaron Aas    EKG done today and reviewed by myself SR/RV paced with PR of (paced QRS is  DEVICE interrogation done today and reviewed by myself Battery and lead measurements are good AF burden 50% In d/w and industry support Programming changes > DDIR > DDDR PVAB shortened slightly to 90ms to tray and see flutter waves better when they occur   12/06/21: TTE 1. Left ventricular ejection fraction, by estimation, is 60 to 65%. The  left ventricle has normal function. The left ventricle has no regional  wall motion abnormalities. There is mild asymmetric left ventricular  hypertrophy of the septal segment. Left  ventricular diastolic parameters are consistent with Grade II diastolic  dysfunction (pseudonormalization). Elevated left ventricular end-diastolic  pressure.   2. Right ventricular systolic function is normal. The right ventricular  size is normal. There is normal pulmonary artery systolic pressure.   3. The mitral valve is normal in structure. Trivial mitral valve  regurgitation. No evidence of mitral stenosis.   4. The aortic valve is tricuspid. Aortic valve regurgitation is trivial.  No aortic stenosis is present.   5. Aortic dilatation noted. There is mild dilatation of the aortic root,  measuring 38 mm. There is borderline dilatation of the ascending aorta,  measuring 36 mm.   6.  The inferior vena cava is normal in size with greater than 50%  respiratory variability, suggesting right atrial pressure of 3 mmHg.       02/24/2020: TTE IMPRESSIONS   1. Left ventricular ejection fraction, by estimation, is 60 to 65%. The  left ventricle has normal function. The left ventricle has no regional  wall motion abnormalities. Left ventricular diastolic parameters are   consistent with Grade II diastolic  dysfunction (pseudonormalization).   2. Right ventricular systolic function is normal. The right ventricular  size is normal. There is normal pulmonary artery systolic pressure. The  estimated right ventricular systolic pressure is 19.3 mmHg.   3. Left atrial size was mildly dilated.   4. The mitral valve is normal in structure. Mild to moderate mitral valve  regurgitation. No evidence of mitral stenosis.   5. The aortic valve is tricuspid. Aortic valve regurgitation is trivial.  No aortic stenosis is present.   6. Aortic dilatation noted. There is mild dilatation of the aortic root,  measuring 38 mm.   7. The inferior vena cava is normal in size with greater than 50%  respiratory variability, suggesting right atrial pressure of 3 mmHg.        Risk Assessment/Calculations:    Physical Exam:   VS:  There were no vitals taken for this visit.   Wt Readings from Last 3 Encounters:  06/27/23 206 lb 12.8 oz (93.8 kg)  05/27/23 202 lb (91.6 kg)  05/08/23 201 lb (91.2 kg)    GEN: Well nourished, well developed in no acute distress NECK: No JVD; No carotid bruits CARDIAC: RRR, no murmurs, rubs, gallops RESPIRATORY:  CTA b/l without rales, wheezing or rhonchi  ABDOMEN: Soft, non-tender, non-distended EXTREMITIES: No edema; No deformity   PPM site: is stable, no thinning, fluctuation, tethering  ASSESSMENT AND PLAN: .    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

## 2023-09-30 ENCOUNTER — Ambulatory Visit: Payer: Self-pay | Admitting: Cardiovascular Disease

## 2023-09-30 ENCOUNTER — Ambulatory Visit (INDEPENDENT_AMBULATORY_CARE_PROVIDER_SITE_OTHER): Payer: Medicare Other

## 2023-09-30 DIAGNOSIS — I495 Sick sinus syndrome: Secondary | ICD-10-CM | POA: Diagnosis not present

## 2023-10-02 ENCOUNTER — Encounter: Payer: Self-pay | Admitting: Physician Assistant

## 2023-10-02 ENCOUNTER — Ambulatory Visit: Attending: Physician Assistant | Admitting: Physician Assistant

## 2023-10-02 VITALS — BP 120/70 | HR 62 | Ht 68.0 in | Wt 218.0 lb

## 2023-10-02 DIAGNOSIS — I4819 Other persistent atrial fibrillation: Secondary | ICD-10-CM | POA: Diagnosis not present

## 2023-10-02 DIAGNOSIS — Z950 Presence of cardiac pacemaker: Secondary | ICD-10-CM | POA: Diagnosis not present

## 2023-10-02 DIAGNOSIS — D6869 Other thrombophilia: Secondary | ICD-10-CM | POA: Insufficient documentation

## 2023-10-02 DIAGNOSIS — I4892 Unspecified atrial flutter: Secondary | ICD-10-CM | POA: Insufficient documentation

## 2023-10-02 DIAGNOSIS — R0609 Other forms of dyspnea: Secondary | ICD-10-CM | POA: Insufficient documentation

## 2023-10-02 LAB — CUP PACEART INCLINIC DEVICE CHECK
Date Time Interrogation Session: 20250612140142
Implantable Lead Connection Status: 753985
Implantable Lead Connection Status: 753985
Implantable Lead Implant Date: 20240910
Implantable Lead Implant Date: 20240910
Implantable Lead Location: 753859
Implantable Lead Location: 753860
Implantable Pulse Generator Implant Date: 20240910
Pulse Gen Model: 2272
Pulse Gen Serial Number: 8196922

## 2023-10-02 NOTE — Patient Instructions (Signed)
 Medication Instructions:   Your physician recommends that you continue on your current medications as directed. Please refer to the Current Medication list given to you today.   *If you need a refill on your cardiac medications before your next appointment, please call your pharmacy*   Lab Work: NONE ORDERED  TODAY    If you have labs (blood work) drawn today and your tests are completely normal, you will receive your results only by: MyChart Message (if you have MyChart) OR A paper copy in the mail If you have any lab test that is abnormal or we need to change your treatment, we will call you to review the results.   Testing/Procedures: Non-Cardiac CT scanning, (CAT scanning), is a noninvasive, special x-ray that produces cross-sectional images of the body using x-rays and a computer. CT scans help physicians diagnose and treat medical conditions. For some CT exams, a contrast material is used to enhance visibility in the area of the body being studied. CT scans provide greater clarity and reveal more details than regular x-ray exams.   Follow-Up: At [facility] you and your health needs are our priority.  As part of our continuing mission to provide you with exceptional heart care, our providers are all part of one team.  This team includes your primary Cardiologist (physician) and Advanced Practice Providers or APPs (Physician Assistants and Nurse Practitioners) who all work together to provide you with the care you need, when you need it.   Your next appointment:    6-8 week(s)   Provider:    Mertha Abrahams, PA-C    We recommend signing up for the patient portal called MyChart.  Sign up information is provided on this After Visit Summary.  MyChart is used to connect with patients for Virtual Visits (Telemedicine).  Patients are able to view lab/test results, encounter notes, upcoming appointments, etc.  Non-urgent messages can be sent to your provider as well.   To  learn more about what you can do with MyChart, go to [email or web address].    Other Instructions:

## 2023-10-04 ENCOUNTER — Ambulatory Visit: Payer: Self-pay | Admitting: Cardiovascular Disease

## 2023-10-25 ENCOUNTER — Other Ambulatory Visit (HOSPITAL_COMMUNITY): Payer: Self-pay | Admitting: Cardiovascular Disease

## 2023-10-25 DIAGNOSIS — I480 Paroxysmal atrial fibrillation: Secondary | ICD-10-CM

## 2023-10-25 DIAGNOSIS — I4891 Unspecified atrial fibrillation: Secondary | ICD-10-CM

## 2023-11-05 NOTE — Progress Notes (Signed)
 Cardiology Office Note:  .   Date:  11/05/2023  ID:  Nicole [name], DOB 1950/10/21, MRN [id number] PCP: Trudy Vaughan HERO, MD  [facility] Providers Cardiologist:  None Electrophysiologist:  Eulas FORBES Furbish, MD {  History of Present Illness: .   Nicole [name] is a 73 y.o. adult w/PMHx of  HTN, HLD, migraine HAs AFib, Flutter, and a number of atrial tachycardia  She saw Dr. Furbish 05/08/23, doing well post AV node ablation, under sensed AFlutter with waves falling in blanking, DDIR > DDD  She saw Brandi 05/27/23 c/o increased fatigue, DOE, and feeling her heart race,  AMS rate was at 90bpm > 70bpm AT detection adjusted DDI  She saw Jodie 06/27/23, seems doing better, still some DOE with stairs DDIR, LRL reduced to 60 Discussed perhaps DDDR if not better at next visit  I saw her 10/02/23 Generally about the same Gets winded easier then she would like, doesn't have the same exertional capacity Has days at a time that she feels tired, also has days that she feels quite well No CP, no palpitations or cardiac awareness No near syncope or syncope No bleeding or signs of bleeding SR/RV paced with PR of (paced QRS is ) Programming changes > DDIR > DDDR PVAB shortened slightly to 90ms to try and see flutter waves better when they occur   Today's visit is scheduled as a 3 mo f/u ROS:   Generally about the same She has noted that her HR seems to be stuck 70's or so and thinks this may contribute to her fatigue, poor exertional capacity > requests her pacing rate be 60. Has an awareness of a rise in her HR when in the 80's  She mentions in the last few weeks especially walking to her son's house or even sitting outside she is tired, generally cold but the other day had to go inside she was sweating so much  Noticed recently when 1st out of bed or standing up gets a little swimmy headed. No near syncope or syncope no CP      Device information MDT ILR  implanted 02/04/2018 > removed 2024 Abbott dual chamber PPM implanted 12/31/2022 (RV lead is LB/septal position) AV node ablation 01/22/23  Arrhythmia/AAD hx Remote flecainide > amiodarone  > Tikosyn  started Sept 2019 >> failed >> back to amiodarone  >> ultimately AV node ablation  Reports of Afib ablation by Dr. Epifanio (record unavailable) reported by the patient Jan and May 2019 Referred to Dr. Kelsie June 2019 PVI and L atrial ablation 2021 (noted as well  Multiple atypical atrial flutter circuits observed with atrial pacing during isuprel  infusion.  These were variable and not suitable for 3D mapping or ablation) third ablation on Sep 05, 2022  During the case, she converted to an atypical flutter that converted with ablation of a line extending from the RSPV to the mitral annulus. However, she has had recurrence of multiple atrial arrhythmias with variable cycle length and activation sequences.  AV node ablation 01/22/23  Studies Reviewed: SABRA    EKG done today and reviewed by myself ATach, V paced 70, QRS  DEVICE interrogation done today and reviewed by myself Battery and auto lead measurements are good She is in an ATach appropriately mode switched Burden since 10/02/23 is 90%   12/06/21: TTE 1. Left ventricular ejection fraction, by estimation, is 60 to 65%. The  left ventricle has normal function. The left ventricle has no regional  wall motion abnormalities. There is  mild asymmetric left ventricular  hypertrophy of the septal segment. Left  ventricular diastolic parameters are consistent with Grade II diastolic  dysfunction (pseudonormalization). Elevated left ventricular end-diastolic  pressure.   2. Right ventricular systolic function is normal. The right ventricular  size is normal. There is normal pulmonary artery systolic pressure.   3. The mitral valve is normal in structure. Trivial mitral valve  regurgitation. No evidence of mitral stenosis.   4. The aortic  valve is tricuspid. Aortic valve regurgitation is trivial.  No aortic stenosis is present.   5. Aortic dilatation noted. There is mild dilatation of the aortic root,  measuring 38 mm. There is borderline dilatation of the ascending aorta,  measuring 36 mm.   6. The inferior vena cava is normal in size with greater than 50%  respiratory variability, suggesting right atrial pressure of 3 mmHg.       02/24/2020: TTE IMPRESSIONS   1. Left ventricular ejection fraction, by estimation, is 60 to 65%. The  left ventricle has normal function. The left ventricle has no regional  wall motion abnormalities. Left ventricular diastolic parameters are  consistent with Grade II diastolic  dysfunction (pseudonormalization).   2. Right ventricular systolic function is normal. The right ventricular  size is normal. There is normal pulmonary artery systolic pressure. The  estimated right ventricular systolic pressure is 19.3 mmHg.   3. Left atrial size was mildly dilated.   4. The mitral valve is normal in structure. Mild to moderate mitral valve  regurgitation. No evidence of mitral stenosis.   5. The aortic valve is tricuspid. Aortic valve regurgitation is trivial.  No aortic stenosis is present.   6. Aortic dilatation noted. There is mild dilatation of the aortic root,  measuring 38 mm.   7. The inferior vena cava is normal in size with greater than 50%  respiratory variability, suggesting right atrial pressure of 3 mmHg.        Risk Assessment/Calculations:    Physical Exam:   VS:  There were no vitals taken for this visit.   Wt Readings from Last 3 Encounters:  10/02/23 218 lb (98.9 kg)  06/27/23 206 lb 12.8 oz (93.8 kg)  05/27/23 202 lb (91.6 kg)    GEN: Well nourished, well developed in no acute distress NECK: No JVD; No carotid bruits CARDIAC: RRR, (paced)no murmurs, rubs, gallops RESPIRATORY: CTA b/l without rales, wheezing or rhonchi  ABDOMEN: Soft, non-tender,  non-distended EXTREMITIES: No edema; No deformity   PPM site: is stable, no thinning, fluctuation, tethering  ASSESSMENT AND PLAN: .    PPM intact function No programming changes   Persistent AFib Aflutter Known to have a number of atrial tachycardias CHA2DS2Vasc is 3, on eliquis , appropriately dosed S/p AV node ablation 90 % burden   OF late: Suspect our record heat wave contributes to her increase in symptoms, fatugue, unusually hot when outside and orthostatic diuzziness I advised that she stay out of the heat (stay inside) as much as able) and stay very well hydrated  I think her arrhythmia burden also plays a role in how she feels, though unfortunately do not think we have any option for rhythm control. I also do not think further programming changes is needed She is appropriately mode switched today  Secondary hypercoagulable state 2/2 AFib   Dispo: back in  2-60mo with Dr. Nancey to review programming, symptoms once we are out of the heat, arrhythmia management options, sooner if needed  Signed, Charlies Macario Arthur, PA-C

## 2023-11-18 ENCOUNTER — Encounter: Payer: Self-pay | Admitting: Physician Assistant

## 2023-11-18 ENCOUNTER — Ambulatory Visit: Attending: Internal Medicine | Admitting: Physician Assistant

## 2023-11-18 VITALS — BP 132/84 | HR 70 | Ht 68.0 in | Wt 223.1 lb

## 2023-11-18 DIAGNOSIS — Z950 Presence of cardiac pacemaker: Secondary | ICD-10-CM | POA: Diagnosis not present

## 2023-11-18 DIAGNOSIS — Z79899 Other long term (current) drug therapy: Secondary | ICD-10-CM | POA: Insufficient documentation

## 2023-11-18 DIAGNOSIS — I4719 Other supraventricular tachycardia: Secondary | ICD-10-CM | POA: Diagnosis not present

## 2023-11-18 DIAGNOSIS — I4819 Other persistent atrial fibrillation: Secondary | ICD-10-CM | POA: Diagnosis not present

## 2023-11-18 DIAGNOSIS — D6869 Other thrombophilia: Secondary | ICD-10-CM | POA: Diagnosis not present

## 2023-11-18 NOTE — Patient Instructions (Signed)
 Medication Instructions:  Your physician recommends that you continue on your current medications as directed. Please refer to the Current Medication list given to you today.  *If you need a refill on your cardiac medications before your next appointment, please call your pharmacy*  Lab Work: TODAY: CBC, BMET If you have labs (blood work) drawn today and your tests are completely normal, you will receive your results only by: MyChart Message (if you have MyChart) OR A paper copy in the mail If you have any lab test that is abnormal or we need to change your treatment, we will call you to review the results.  Follow-Up: At [facility], you and your health needs are our priority.  As part of our continuing mission to provide you with exceptional heart care, our providers are all part of one team.  This team includes your primary Cardiologist (physician) and Advanced Practice Providers or APPs (Physician Assistants and Nurse Practitioners) who all work together to provide you with the care you need, when you need it.  Your next appointment:   2-3 month(s)  Provider:   Eulas Furbish, MD (MD only)  We recommend signing up for the patient portal called MyChart.  Sign up information is provided on this After Visit Summary.  MyChart is used to connect with patients for Virtual Visits (Telemedicine).  Patients are able to view lab/test results, encounter notes, upcoming appointments, etc.  Non-urgent messages can be sent to your provider as well.   To learn more about what you can do with MyChart, go to [email or web address].

## 2023-11-19 ENCOUNTER — Ambulatory Visit: Payer: Self-pay | Admitting: Physician Assistant

## 2023-11-19 ENCOUNTER — Telehealth: Payer: Self-pay | Admitting: Physician Assistant

## 2023-11-19 LAB — CBC
Hematocrit: 31.1 % — ABNORMAL LOW (ref 34.0–46.6)
Hemoglobin: 9.4 g/dL — ABNORMAL LOW (ref 11.1–15.9)
MCH: 25.5 pg — ABNORMAL LOW (ref 26.6–33.0)
MCHC: 30.2 g/dL — ABNORMAL LOW (ref 31.5–35.7)
MCV: 84 fL (ref 79–97)
Platelets: 339 x10E3/uL (ref 150–450)
RBC: 3.69 x10E6/uL — ABNORMAL LOW (ref 3.77–5.28)
RDW: 14 % (ref 11.7–15.4)
WBC: 5.1 x10E3/uL (ref 3.4–10.8)

## 2023-11-19 LAB — BASIC METABOLIC PANEL WITH GFR
BUN/Creatinine Ratio: 22 (ref 12–28)
BUN: 20 mg/dL (ref 8–27)
CO2: 21 mmol/L (ref 20–29)
Calcium: 9.4 mg/dL (ref 8.7–10.3)
Chloride: 102 mmol/L (ref 96–106)
Creatinine, Ser: 0.92 mg/dL (ref 0.57–1.00)
Glucose: 93 mg/dL (ref 70–99)
Potassium: 4.8 mmol/L (ref 3.5–5.2)
Sodium: 141 mmol/L (ref 134–144)
eGFR: 66 mL/min/1.73 (ref >59)

## 2023-11-19 NOTE — Telephone Encounter (Signed)
 Pt would like a c/b regarding yesterday's visit. Please advise

## 2023-11-19 NOTE — Telephone Encounter (Signed)
 Spoke with pt regarding her pacemaker. Pt stated that she saw Nicole [name] on 6/12 and tried to explain that after seeing Nicole [name] on 06/27/23 she was feeling better. Her heart rate was able to go up with activity and come down when she is resting. Since her adjustment on 6/12 the pt stated she cannot walk to her car without becoming short of breath and her heart rate does not go above 70. Pt stated she would also like an explanation for the tachycardia Nicole [name] said she was having for the month of June. Pt was told her concerns would be sent to [place], PA-C for her suggestions. Pt verbalized understanding. All questions if any were answered.

## 2023-11-21 NOTE — Telephone Encounter (Signed)
 Pt called back in for an update

## 2023-11-24 NOTE — Telephone Encounter (Signed)
 Spoke with pt and discussed Nicole [name]'s note with pt.  Pt states she is not seeing an increase in her heart rate with exercise.  She would like for appointment with Dr Nancey to be moved up.  Appointment scheduled for 12/05/2023 at 1115am.  Pt verbalizes understanding and thanked Charity fundraiser for the call.

## 2023-12-02 NOTE — Progress Notes (Signed)
 Remote pacemaker transmission.

## 2023-12-05 ENCOUNTER — Ambulatory Visit: Attending: Cardiovascular Disease | Admitting: Cardiovascular Disease

## 2023-12-05 ENCOUNTER — Encounter: Payer: Self-pay | Admitting: Cardiovascular Disease

## 2023-12-05 VITALS — BP 130/90 | HR 70 | Ht 68.0 in | Wt 224.0 lb

## 2023-12-05 DIAGNOSIS — I4819 Other persistent atrial fibrillation: Secondary | ICD-10-CM | POA: Diagnosis not present

## 2023-12-05 DIAGNOSIS — I495 Sick sinus syndrome: Secondary | ICD-10-CM

## 2023-12-05 NOTE — Progress Notes (Signed)
 Electrophysiology Office Note:    Date:  12/05/2023   ID:  Sarah Baez, DOB 09/21/1950, MRN [id number]  PCP:  Trudy Vaughan HERO, MD   [facility] Providers Cardiologist:  None Electrophysiologist:  Eulas FORBES Furbish, MD     Referring MD: Trudy Vaughan HERO, MD   History of Present Illness:    Nicole [name] is a 73 y.o. adult with a hx listed below, significant for atrial fibrillation s/p ablation and Tikosyn , referred for arrhythmia management.     She underwent a repeat AF ablation by Dr. Kelsie in Nov 2021. His note indicated that she had multiple flutter circuits not suitable for ablation. She has had recurrence of AF, initially in the setting of emotional or physiologic stress, this year she has had two recurrences of AF that did not appear to be provoked. I reviewed the ECGs from the recent visits. They do appear to show AF rather than an atypical flutter.  She is very hesitant to consider a convergent procedure.  She continued to have frequent episodes of atrial fibrillation and flutter, a few requiring ER visits. She was switched from Tikosyn  to amiodarone . She underwent a third ablation on Sep 05, 2022. Only a small amount of touch-up was required to re-isolate the pulmonary veins. During the case, she converted to an atypical flutter that converted with ablation of a line extending from the RSPV to the mitral annulus. However, she has had recurrence of multiple atrial arrhythmias with variable cycle length and activation sequences.  Her loop recorder was removed.  She did well for a time period after the ablation but then began to have recurrences again.  Amiodarone  was started and again, she did well for a time period.  She notes now that over the past several months she has had increasing episodes of atrial fibrillation, often times with rapid rates and severe symptoms.    She underwent AV node ablation on January 22, 2023. Since the procedure, she has been doing  much better overall.  She has had a few episodes where she felt her heart racing with minimal, if any, exertion  We have been having multiple visits getting her device reprogramming fine-tune.  She was oftentimes under sensing flutter waves.  She was programmed DDI at some point --I suspect this was our setting prior to the AVJ ablation.  She was reprogrammed on June 12, and since, she reports that she has felt very bad and had poor energy.   EKGs/Labs/Other Studies Reviewed Today:         Recent Labs: 01/07/2023: Magnesium 2.1 11/18/2023: BUN 20; Creatinine, Ser 0.92; Hemoglobin 9.4; Platelets 339; Potassium 4.8; Sodium 141     Physical Exam:    VS:  BP (!) 130/90 (BP Location: Right Arm, Patient Position: Sitting, Cuff Size: Large)   Pulse 70   Ht 5' 8 (1.727 m)   Wt 224 lb (101.6 kg)   SpO2 95%   BMI 34.06 kg/m     Wt Readings from Last 3 Encounters:  12/05/23 224 lb (101.6 kg)  11/18/23 223 lb 1.6 oz (101.2 kg)  10/02/23 218 lb (98.9 kg)     GEN: Well nourished, well developed in no acute distress CARDIAC: RRR, no murmurs, rubs, gallops Pacemaker in normal state of healing.  Steri-Strips are in place. RESPIRATORY:  Normal work of breathing MUSCULOSKELETAL: no edema    ASSESSMENT & PLAN:    Atrial fibrillation: s/p ablation x3, now on amiodarone . Now having symptomatic recurrence on amiodarone  200mg   daily She has tachy-brady and is very symptomatic with tachycardia/AF episodes We have exhausted options for rhythm control S/p  AVJ ablation   Abbott dual-chamber pacemaker Device interrogated today.  See Paceart for report.  I reviewed the interrogation in detail Normal lead parameters She was undersensing AF and flutter due to flutter beats within blanking period. She did not tolerate DDD programming.  She felt much better as she was programmed prior to October 02, 2023 We increased rate response today If she does not feel significant better, we may need to  reprogram her to the where she was prior to June 12  Secondary hypercoagulable state:   continue apixaban  5 mg BID         Medication Adjustments/Labs and Tests Ordered: Current medicines are reviewed at length with the patient today.  Concerns regarding medicines are outlined above.  Orders Placed This Encounter  Procedures   EKG 12-Lead   No orders of the defined types were placed in this encounter.    Signed, Eulas FORBES Furbish, MD  12/05/2023 11:54 AM    [facility]

## 2023-12-05 NOTE — Patient Instructions (Signed)
 Medication Instructions:  Your physician recommends that you continue on your current medications as directed. Please refer to the Current Medication list given to you today.  *If you need a refill on your cardiac medications before your next appointment, please call your pharmacy*   Follow-Up: At [facility], you and your health needs are our priority.  As part of our continuing mission to provide you with exceptional heart care, our providers are all part of one team.  This team includes your primary Cardiologist (physician) and Advanced Practice Providers or APPs (Physician Assistants and Nurse Practitioners) who all work together to provide you with the care you need, when you need it.  Your next appointment:   2 week(s)  Provider:   You will see one of the following Advanced Practice Providers on your designated Care Team:   Charlies Arthur, PA-C Michael Andy Tillery, PA-C Suzann Riddle, NP Daphne Barrack, NP      We recommend signing up for the patient portal called MyChart.  Sign up information is provided on this After Visit Summary.  MyChart is used to connect with patients for Virtual Visits (Telemedicine).  Patients are able to view lab/test results, encounter notes, upcoming appointments, etc.  Non-urgent messages can be sent to your provider as well.   To learn more about what you can do with MyChart, go to [email or web address].

## 2023-12-10 ENCOUNTER — Other Ambulatory Visit: Payer: Self-pay | Admitting: Cardiovascular Disease

## 2023-12-10 DIAGNOSIS — I4819 Other persistent atrial fibrillation: Secondary | ICD-10-CM

## 2023-12-10 NOTE — Telephone Encounter (Signed)
 Eliquis  5mg  refill request received. Patient is 73 years old, weight-101.6kg, Crea-0.92 on 11/18/23, Diagnosis-Afib, and last seen by Dr. Nancey on 12/05/23. Dose is appropriate based on dosing criteria. Will send in refill to requested pharmacy.

## 2023-12-23 ENCOUNTER — Encounter (HOSPITAL_BASED_OUTPATIENT_CLINIC_OR_DEPARTMENT_OTHER): Payer: Self-pay

## 2023-12-23 NOTE — Progress Notes (Unsigned)
  Electrophysiology Office Note:   ID:  Nicole [name], DOB 07/23/1950, MRN [id number]  Primary Cardiologist: None Electrophysiologist: Eulas FORBES Furbish, MD  {Click to update primary MD,subspecialty MD or APP then REFRESH:1}    History of Present Illness:   Nicole [name] is a 73 y.o. adult with h/o AF despite multiple ablations and AAD, s/p AV nodal ablation, tachy-brady s/p PPM, anxiety, anemia, HLD, and HTN seen today for routine electrophysiology followup.   Since last being seen in our clinic the patient reports doing ***.  she denies chest pain, palpitations, dyspnea, PND, orthopnea, nausea, vomiting, dizziness, syncope, edema, weight gain, or early satiety.   Review of systems complete and found to be negative unless listed in HPI.   EP Information / Studies Reviewed:    EKG is not ordered today. EKG from 12/05/2023 reviewed which showed likely AT with ventricular rate of ~70 and atrial rate of ~ 140-150        PPM Interrogation-  reviewed in detail today,  See PACEART report.  Arrhythmia/Device History Abbott Dual Chamber PPM 12/31/2022 for tachy-brady   S/p AV nodal ablation 01/22/2023  S/p AF ablation x 3 - most recent 08/2022  Medtronic - ILR - Carelink-RRT 12/31/21   Physical Exam:   VS:  There were no vitals taken for this visit.   Wt Readings from Last 3 Encounters:  12/05/23 224 lb (101.6 kg)  11/18/23 223 lb 1.6 oz (101.2 kg)  10/02/23 218 lb (98.9 kg)     GEN: No acute distress  NECK: No JVD; No carotid bruits CARDIAC: {EPRHYTHM:28826}, no murmurs, rubs, gallops RESPIRATORY:  Clear to auscultation without rales, wheezing or rhonchi  ABDOMEN: Soft, non-tender, non-distended EXTREMITIES:  {EDEMA LEVEL:28147::No} edema; No deformity   ASSESSMENT AND PLAN:    Tachy-Brady syndrome s/p Abbott PPM  Persistent AF s/p AV nodal ablation Normal PPM function See Pace Art report No changes today  HTN Stable on current regimen   Secondary hypercoagulable  state Pt on Eliquis  as above   {Click here to Review PMH, Prob List, Meds, Allergies, SHx, FHx  :1}   Disposition:   Follow up with {EPPROVIDERS:28135::EP Team} {EPFOLLOW UP:28173}  Signed, Ozell Prentice Passey, PA-C

## 2023-12-25 ENCOUNTER — Ambulatory Visit: Payer: Self-pay | Admitting: Cardiovascular Disease

## 2023-12-25 ENCOUNTER — Encounter: Payer: Self-pay | Admitting: Student

## 2023-12-25 ENCOUNTER — Ambulatory Visit: Attending: Student | Admitting: Student

## 2023-12-25 VITALS — BP 130/74 | HR 69 | Ht 68.0 in | Wt 226.2 lb

## 2023-12-25 DIAGNOSIS — I4819 Other persistent atrial fibrillation: Secondary | ICD-10-CM | POA: Insufficient documentation

## 2023-12-25 DIAGNOSIS — I10 Essential (primary) hypertension: Secondary | ICD-10-CM | POA: Diagnosis not present

## 2023-12-25 DIAGNOSIS — I4719 Other supraventricular tachycardia: Secondary | ICD-10-CM | POA: Diagnosis not present

## 2023-12-25 DIAGNOSIS — D6869 Other thrombophilia: Secondary | ICD-10-CM | POA: Diagnosis not present

## 2023-12-25 LAB — CUP PACEART INCLINIC DEVICE CHECK
Battery Remaining Longevity: 111 mo
Battery Voltage: 2.99 V
Brady Statistic RA Percent Paced: 4.8 %
Brady Statistic RV Percent Paced: 99.99 %
Date Time Interrogation Session: 20250904093228
Implantable Lead Connection Status: 753985
Implantable Lead Connection Status: 753985
Implantable Lead Implant Date: 20240910
Implantable Lead Implant Date: 20240910
Implantable Lead Location: 753859
Implantable Lead Location: 753860
Implantable Pulse Generator Implant Date: 20240910
Lead Channel Impedance Value: 425 Ohm
Lead Channel Impedance Value: 475 Ohm
Lead Channel Pacing Threshold Amplitude: 0.625 V
Lead Channel Pacing Threshold Pulse Width: 0.5 ms
Lead Channel Sensing Intrinsic Amplitude: 12 mV
Lead Channel Sensing Intrinsic Amplitude: 5 mV
Lead Channel Setting Pacing Amplitude: 0.875
Lead Channel Setting Pacing Amplitude: 2 V
Lead Channel Setting Pacing Pulse Width: 0.5 ms
Lead Channel Setting Sensing Sensitivity: 4 mV
Pulse Gen Model: 2272
Pulse Gen Serial Number: 8196922

## 2023-12-25 NOTE — Patient Instructions (Signed)
 Medication Instructions:  Your physician recommends that you continue on your current medications as directed. Please refer to the Current Medication list given to you today.  *If you need a refill on your cardiac medications before your next appointment, please call your pharmacy*  Lab Work: None ordered If you have labs (blood work) drawn today and your tests are completely normal, you will receive your results only by: MyChart Message (if you have MyChart) OR A paper copy in the mail If you have any lab test that is abnormal or we need to change your treatment, we will call you to review the results.  Follow-Up: At [facility], you and your health needs are our priority.  As part of our continuing mission to provide you with exceptional heart care, our providers are all part of one team.  This team includes your primary Cardiologist (physician) and Advanced Practice Providers or APPs (Physician Assistants and Nurse Practitioners) who all work together to provide you with the care you need, when you need it.  Your next appointment:   3 month(s)  Provider:   Ozell Jodie Passey, PA-C

## 2023-12-30 ENCOUNTER — Ambulatory Visit: Payer: Medicare Other

## 2023-12-30 DIAGNOSIS — I495 Sick sinus syndrome: Secondary | ICD-10-CM | POA: Diagnosis not present

## 2023-12-31 LAB — CUP PACEART REMOTE DEVICE CHECK
Battery Remaining Longevity: 110 mo
Battery Remaining Percentage: 89 %
Battery Voltage: 2.99 V
Brady Statistic AP VP Percent: 1.5 %
Brady Statistic AP VS Percent: 0 %
Brady Statistic AS VP Percent: 99 %
Brady Statistic AS VS Percent: 1 %
Brady Statistic RA Percent Paced: 1.7 %
Brady Statistic RV Percent Paced: 99 %
Date Time Interrogation Session: 20250909040015
Implantable Lead Connection Status: 753985
Implantable Lead Connection Status: 753985
Implantable Lead Implant Date: 20240910
Implantable Lead Implant Date: 20240910
Implantable Lead Location: 753859
Implantable Lead Location: 753860
Implantable Pulse Generator Implant Date: 20240910
Lead Channel Impedance Value: 430 Ohm
Lead Channel Impedance Value: 460 Ohm
Lead Channel Pacing Threshold Amplitude: 0.625 V
Lead Channel Pacing Threshold Amplitude: 0.75 V
Lead Channel Pacing Threshold Pulse Width: 0.5 ms
Lead Channel Pacing Threshold Pulse Width: 0.5 ms
Lead Channel Sensing Intrinsic Amplitude: 12 mV
Lead Channel Sensing Intrinsic Amplitude: 5 mV
Lead Channel Setting Pacing Amplitude: 0.875
Lead Channel Setting Pacing Amplitude: 2 V
Lead Channel Setting Pacing Pulse Width: 0.5 ms
Lead Channel Setting Sensing Sensitivity: 4 mV
Pulse Gen Model: 2272
Pulse Gen Serial Number: 8196922

## 2024-01-09 ENCOUNTER — Ambulatory Visit: Payer: Self-pay | Admitting: Cardiovascular Disease

## 2024-01-09 NOTE — Progress Notes (Signed)
 Remote PPM Transmission

## 2024-01-28 ENCOUNTER — Ambulatory Visit: Admitting: Cardiovascular Disease

## 2024-01-30 ENCOUNTER — Other Ambulatory Visit: Payer: Self-pay | Admitting: Student

## 2024-02-02 ENCOUNTER — Telehealth: Payer: Self-pay | Admitting: Cardiovascular Disease

## 2024-02-02 MED ORDER — METOPROLOL SUCCINATE ER 100 MG PO TB24: 100.0000 mg | ORAL_TABLET | Freq: Every day | ORAL | 3 refills | Status: AC

## 2024-02-02 NOTE — Telephone Encounter (Signed)
*  STAT* If patient is at the pharmacy, call can be transferred to refill team.   1. Which medications need to be refilled? (please list name of each medication and dose if known)   metoprolol  succinate (TOPROL  XL) 100 MG 24 hr tablet    2. Which pharmacy/location (including street and city if local pharmacy) is medication to be sent to? [id number] - [place], [place] - [address]   3. Do they need a 30 day or 90 day supply? 90

## 2024-02-02 NOTE — Telephone Encounter (Signed)
 RX sent in

## 2024-02-05 DIAGNOSIS — Z23 Encounter for immunization: Secondary | ICD-10-CM | POA: Diagnosis not present

## 2024-02-19 DIAGNOSIS — R21 Rash and other nonspecific skin eruption: Secondary | ICD-10-CM | POA: Diagnosis not present

## 2024-02-19 DIAGNOSIS — Z1231 Encounter for screening mammogram for malignant neoplasm of breast: Secondary | ICD-10-CM | POA: Diagnosis not present

## 2024-02-19 DIAGNOSIS — L2089 Other atopic dermatitis: Secondary | ICD-10-CM | POA: Diagnosis not present

## 2024-03-24 NOTE — Progress Notes (Unsigned)
  Electrophysiology Office Note:   ID:  Nicole [name], DOB [birth year], MRN [id number]  Primary Cardiologist: None Electrophysiologist: Eulas FORBES Furbish, MD  {Click to update primary MD,subspecialty MD or APP then REFRESH:1}    History of Present Illness:   Nicole [name] is a 73 y.o. adult with h/o AF despite multiple ablations and AAD, s/p AV nodal ablation, tachy-brady s/p PPM, anxiety, anemia, HLD, and HTN seen today for routine electrophysiology followup.   Seen 12/25/2023 for 6 month follow up. Undid several prior changes to her rate response due to fatigue and palpitations with minimal activity:  Changed back to DDIR Slope decreased to 8 and Reaction time changed from Medium to Slow.  Mode switch rate also decreased to 60, as she has failed AADs and do not plan on rhythm control - Can adjust back up depending on her symptoms.   Since last being seen in our clinic the patient reports doing ***.  she denies chest pain, palpitations, dyspnea, PND, orthopnea, nausea, vomiting, dizziness, syncope, edema, weight gain, or early satiety.   Review of systems complete and found to be negative unless listed in HPI.   EP Information / Studies Reviewed:    EKG is not ordered today. EKG from *** reviewed which showed ***       PPM Interrogation-  reviewed in detail today,  See PACEART report.  Arrhythmia/Device History Abbott Dual Chamber PPM 12/31/2022 for tachy-brady   S/p AV nodal ablation 01/22/2023  S/p AF ablation x 3 - most recent 08/2022  Medtronic - ILR - Carelink-RRT 12/31/21   Physical Exam:   VS:  There were no vitals taken for this visit.   Wt Readings from Last 3 Encounters:  12/25/23 226 lb 3.2 oz (102.6 kg)  12/05/23 224 lb (101.6 kg)  11/18/23 223 lb 1.6 oz (101.2 kg)     GEN: No acute distress  NECK: No JVD; No carotid bruits CARDIAC: {EPRHYTHM:28826}, no murmurs, rubs, gallops RESPIRATORY:  Clear to auscultation without rales, wheezing or rhonchi  ABDOMEN: Soft,  non-tender, non-distended EXTREMITIES:  {EDEMA LEVEL:28147::No} edema; No deformity   ASSESSMENT AND PLAN:    Tachy-Brady syndrome s/p Abbott PPM  Persistent AF s/p AV nodal ablation Secondary hypercoagulable state Normal PPM function See Pace Art report No changes today Continue Eliquis  5 mg BID for CHA2DS2VASc of at least 3   HTN Stable on current regimen   {Click here to Review PMH, Prob List, Meds, Allergies, SHx, FHx  :1}   Disposition:   Follow up with {EPPROVIDERS:28135::EP Team} {EPFOLLOW UP:28173}  Signed, Ozell Prentice Passey, PA-C

## 2024-03-25 ENCOUNTER — Encounter: Payer: Self-pay | Admitting: Student

## 2024-03-25 ENCOUNTER — Ambulatory Visit: Attending: Student | Admitting: Student

## 2024-03-25 VITALS — BP 136/78 | HR 88 | Ht 68.0 in | Wt 223.0 lb

## 2024-03-25 DIAGNOSIS — I4819 Other persistent atrial fibrillation: Secondary | ICD-10-CM | POA: Diagnosis not present

## 2024-03-25 DIAGNOSIS — I10 Essential (primary) hypertension: Secondary | ICD-10-CM | POA: Diagnosis not present

## 2024-03-25 DIAGNOSIS — D6869 Other thrombophilia: Secondary | ICD-10-CM | POA: Insufficient documentation

## 2024-03-25 DIAGNOSIS — I495 Sick sinus syndrome: Secondary | ICD-10-CM | POA: Insufficient documentation

## 2024-03-25 NOTE — Patient Instructions (Signed)
 Medication Instructions:  No medication changes today. *If you need a refill on your cardiac medications before your next appointment, please call your pharmacy*  Lab Work: No labwork ordered today. If you have labs (blood work) drawn today and your tests are completely normal, you will receive your results only by: MyChart Message (if you have MyChart) OR A paper copy in the mail If you have any lab test that is abnormal or we need to change your treatment, we will call you to review the results.  Testing/Procedures: No testing ordered today  Follow-Up: At [facility], you and your health needs are our priority.  As part of our continuing mission to provide you with exceptional heart care, our providers are all part of one team.  This team includes your primary Cardiologist (physician) and Advanced Practice Providers or APPs (Physician Assistants and Nurse Practitioners) who all work together to provide you with the care you need, when you need it.  Your next appointment:   12 month(s)  Provider:   You may see Efraim Grange, MD or one of the following Advanced Practice Providers on your designated Care Team:   Mertha Abrahams, [place] [address] [place], PA-C Suzann Riddle, NP Creighton Doffing, NP    We recommend signing up for the patient portal called "MyChart".  Sign up information is provided on this After Visit Summary.  MyChart is used to connect with patients for Virtual Visits (Telemedicine).  Patients are able to view lab/test results, encounter notes, upcoming appointments, etc.  Non-urgent messages can be sent to your provider as well.   To learn more about what you can do with MyChart, go to [email or web address].

## 2024-03-30 ENCOUNTER — Ambulatory Visit: Payer: Medicare Other

## 2024-03-30 DIAGNOSIS — I4819 Other persistent atrial fibrillation: Secondary | ICD-10-CM

## 2024-03-31 LAB — CUP PACEART REMOTE DEVICE CHECK
Battery Remaining Longevity: 102 mo
Battery Remaining Percentage: 86 %
Battery Voltage: 2.99 V
Brady Statistic AP VP Percent: 31 %
Brady Statistic AP VS Percent: 1 %
Brady Statistic AS VP Percent: 68 %
Brady Statistic AS VS Percent: 1 %
Brady Statistic RA Percent Paced: 33 %
Brady Statistic RV Percent Paced: 99 %
Date Time Interrogation Session: 20251209040015
Implantable Lead Connection Status: 753985
Implantable Lead Connection Status: 753985
Implantable Lead Implant Date: 20240910
Implantable Lead Implant Date: 20240910
Implantable Lead Location: 753859
Implantable Lead Location: 753860
Implantable Pulse Generator Implant Date: 20240910
Lead Channel Impedance Value: 410 Ohm
Lead Channel Impedance Value: 460 Ohm
Lead Channel Pacing Threshold Amplitude: 0.625 V
Lead Channel Pacing Threshold Amplitude: 0.75 V
Lead Channel Pacing Threshold Pulse Width: 0.5 ms
Lead Channel Pacing Threshold Pulse Width: 0.5 ms
Lead Channel Sensing Intrinsic Amplitude: 12 mV
Lead Channel Sensing Intrinsic Amplitude: 3.7 mV
Lead Channel Setting Pacing Amplitude: 0.875
Lead Channel Setting Pacing Amplitude: 2 V
Lead Channel Setting Pacing Pulse Width: 0.5 ms
Lead Channel Setting Sensing Sensitivity: 4 mV
Pulse Gen Model: 2272
Pulse Gen Serial Number: 8196922

## 2024-04-07 NOTE — Progress Notes (Signed)
 Remote PPM Transmission

## 2024-04-09 ENCOUNTER — Ambulatory Visit: Payer: Self-pay | Admitting: Cardiovascular Disease
